# Patient Record
Sex: Male | Born: 1990 | Race: Black or African American | Hispanic: No | Marital: Single
Health system: Midwestern US, Community
[De-identification: ages and names within clinical notes are randomized; demographics above are authoritative.]

## PROBLEM LIST (undated history)

## (undated) DIAGNOSIS — J45909 Unspecified asthma, uncomplicated: Secondary | ICD-10-CM

## (undated) DIAGNOSIS — Z21 Asymptomatic human immunodeficiency virus [HIV] infection status: Secondary | ICD-10-CM

---

## 2011-05-19 NOTE — ED Provider Notes (Signed)
MEDICATION ADMINISTRATION SUMMARY              Drug Name: PredniSONE, Dose Ordered: 60 mg , Route: Oral, Status:         Given, Time: 22:51 05/19/2011, Detailed record available in Medication         Service section.       KNOWN ALLERGIES   NKDA       TRIAGE Wynelle Link May 19, 2011 22:11 MWW1)   PATIENT: NAME: Travis [name], AGE: 21, GENDER: male, DOB: 1990-09-12, TIME OF GREET: Sun May 19, 2011 22:00, LANGUAGE: [place],         [place]: [id number], KG WEIGHT: 104.3, HEIGHT: 180cm, MEDICAL RECORD         NUMBER: [id number], ACCOUNT NUMBER: [id number], PCP: Dr Delford Field,. Wynelle Link May 19, 2011 22:11 MWW1)   ADMISSION: URGENCY: 3, TRANSPORT: Ambulatory, DEPT: Emergency,         BED: WAITING. Wynelle Link May 19, 2011 22:11 MWW1)   VITAL SIGNS: BP 175/112, (Sitting), Pulse 88, Resp 24, Temp 97.8,         (Oral), Pain 5, O2 Sat 100%, on Room air, Time 05/19/2011 22:07.         (22:07 MWW1)   COMPLAINT:  Asthma Attack. Wynelle Link May 19, 2011 22:11 MWW1)   PRESENTING COMPLAINT:  Pt states that when bending down he         started to have pain in chest and wheezing and unable to walk due to         pain, Since Today, TIME Since 22:00. (22:34 EAG1)   PAIN: Patient complains of pain, On a scale 0-10 patient rates         pain as 5, Jumping - R and L chest, Pain is constant, Onset was 40         minutes prior to arrival, No modifying factors. (22:34 EAG1)   TREATMENT PRIOR TO ARRIVAL: None. (22:34 EAG1)   TB SCREENING: TB screen negative for this patient. (22:34         EAG1)   ABUSE SCREENING: Patient denies physical abuse or threats. (22:34         EAG1)   FALL RISK: Patient has a low risk of falling. (22:34 EAG1)   SUICIDAL IDEATION: Suicidal ideation is not present. (22:34         EAG1)   ADVANCE DIRECTIVES: Patient does not have advance directives.         (22:34 EAG1)   PROVIDERS: TRIAGE NURSE: Hoyle Sauer, RN. Wynelle Link May 19, 2011         22:11 MWW1)       PRESENTING PROBLEM Wynelle Link May 19, 2011 22:11 MWW1)       Presenting problems: Dyspnea - Adult.       CURRENT MEDICATIONS (22:12 MWW1)   Albuterol Sulfate:  As needed every four hours.       MEDICATION SERVICE (22:51 MMA)   PredniSONE:  Order: PredniSONE (Prednisone) - Dose: 60         mg : Oral         Ordered by: Phil Dopp, PA-C         Entered by: Phil Dopp, PA-C Sun May 19, 2011 22:42 ,          Acknowledged by: Berton Lan, LPN Sun May 19, 2011 22:44         Documented  as given by: Berton Lan, LPN Sun May 19, 2011 22:51          Patient, Medication, Dose, Route and Time verified prior to         administration.          Amount given: 60mg , Site: Medication administered P.O., Correct         patient, time, route, dose and medication confirmed prior to         administration, Patient advised of actions and side-effects prior to         administration, Allergies confirmed and medications reviewed prior to         administration, Patient in position of comfort, Side rails up, Cart         in lowest position, Family at bedside, Call light in reach.       ORDERS   HAND HELD NEBULIZER:  Ordered for: Arvella Merles, MD, Rolm Gala         Status: Active. (22:41 MMA)   12 LEAD EKG:  Ordered for: Arvella Merles, MD, Rolm Gala         Status: Active. (22:41 MMA)   I-Stat Chem8+ (complete):  Ordered for: Arvella Merles, MD, Rolm Gala         Status: Done by Ulla Gallo, RN, Bary Castilla May 19, 2011 23:20. (23:06         [place])   CHEST 2 VIEWS:  Ordered for: Arvella Merles, MD, Rolm Gala         Status: Active. (23:06 [place])   HAND HELD NEBULIZER:  Ordered for: Arvella Merles, MD, Rolm Gala         Status: Active. (23:31 EHK1)       NURSING ASSESSMENT: RESPIRATORY /CHEST (22:43 WAB1)   CONSTITUTIONAL: History obtained from patient, Patient arrives         ambulatory, Gait steady, Patient appears comfortable, Patient         cooperative, Patient alert, Oriented to person, place and time, Skin         warm, Skin dry, Skin normal in color, Mucous membranes pink, Mucous         membranes moist, Patient is well-groomed.    PAIN: aching pain, to the left chest, to the right chest,         constant, on a scale 0-10 patient rates pain as 5, Pain exacerbated         by nothing, Nothing has been tried to alleviate the pain.   RESPIRATORY/CHEST: Respiratory assessment findings include         respiratory effort easy, Respirations regular, Conversing normally,         no signs of distress, Breath sounds with wheezing, audibly, to the         left upper lobe, to the left lower lobe, Breath sounds otherwise         clear.       NURSING PROCEDURE: DISCHARGE NOTE (23:46 WAB1)   DISCHARGE: Patient discharged to home, ambulating without         assistance, family driving, accompanied by parent, Summary of Care         printed/ provided, Discharge instructions given to patient, Simple or         moderate discharge teaching performed, by w.bennetch RN,         Prescriptions given and instructions on side effects given, Name of         prescription(s) given: proair, prednisone, Above person(s) verbalized         understanding of  discharge instructions and follow-up care, Patient         treated and evaluated by physician.       NURSING PROCEDURE: EKG CHART (22:51 JEC3)   PATIENT IDENTIFIER: Patient's identity verified by patient         stating name, Patient's identity verified by patient stating birth         date, Patient's identity verified by hospital ID bracelet, Patient         actively involved in identification process.   EKG: 12 lead EKG performed on the left chest, done by jec, Notes:         done @ 2246.   FOLLOW-UP: After procedure, EKG for interpretation given to Dr.         Mickie Kay, EKG was given to Dr. at 2249.   NOTES: Patient tolerated procedure well.   SAFETY: Side rails up, Cart/Stretcher in lowest position, Family         at bedside, Call light within reach, Hospital ID band on.       NURSING PROCEDURE: LAB DRAW (23:26 WAB1)   PATIENT IDENTIFIER: Patient's identity verified by patient          stating name, Patient's identity verified by patient stating birth         date, Patient's identity verified by hospital ID bracelet, Patient         actively involved in identification process.   LAB DRAW: Lab draw indicated for inability to obtain labs from IV         site, Initial lab draw performed, by venipuncture, from left         antecubital, in one attempt, Labs were drawn at 2322, Tourniquet         removed from patient after procedure., Notes: [email or web address] completed @         bedside.   FOLLOW-UP: After procedure, dressing applied to site, After         procedure, no swelling at site, After procedure, no active bleeding         from site.       NURSING PROCEDURE: TRANSPORT TO TESTS   PATIENT IDENTIFIER: Patient's identity verified by patient         stating name, Patient's identity verified by patient stating birth         date, Patient's identity verified by hospital ID bracelet. (23:09         GNW)   TRANSPORT TO TESTS: Patient transported to x-ray, ambulatory,         Accompanied by x-ray technician. (23:09 GNW)   FOLLOW-UP: After procedure, patient returned to emergency         department. (23:14 GNW)       DIAGNOSIS (23:39 MMA)   FINAL: PRIMARY: Asthma exacerbation (acute).       DISPOSITION   PATIENT:  Disposition Type: Discharged, Disposition: Discharge,         Condition: Stable. (23:39 MMA)      Patient left the department. (23:47 WAB1)       VITAL SIGNS   VITAL SIGNS: BP: 175/112 (Sitting), Pulse: 88, Resp: 24, Temp:         97.8 (Oral), Pain: 5, O2 sat: 100% on Room air, Time: 05/19/2011         22:07. (22:07 MWW1)     BP: 193/106 (Lying), Pulse: 94, Resp: 20, Time: 05/19/2011 22:35. (22:35         EAG1)  BP: 165/104 (Lying), Pulse: 80, Resp: 16, Pain: 0, Time: 05/19/2011 23:34.         (23:34 EAG1)       INSTRUCTION (23:41 MMA)   DISCHARGE:  ASTHMA (RAD, WHEEZING, REVERSIBLE AIRWAY DISEASE),         ELEVATED BLOOD PRESSURE (HTN, HYPERTENSION, HIGH BP, ELEVATED BP).    SPECIAL:  Follow up with primary care physician for recheck of         your blood pressure.         Take prescribed medication(s) as directed. Use inhaler 2 puffs every         4 hours.          Return to the ER if condition worsens or new symptoms develop.       PRESCRIPTION   ProAir HFA:  Aerosol With Adapter : Cfc Free 90 Mcg/Inh :         Inhalation : Quantity: *** 2 *** Unit: puff(s) Route: Inhalation         Schedule: As needed every 4 to 6 hours Dispense: *** 1 ***. (23:24         MMA)   NOTES:  No refills          May use generic. (23:24 MMA)   PredniSONE:  Tablet : 20 Mg : Oral : Quantity: *** 3 *** Unit:         tab(s) Route: Oral Schedule: once a day Dispense: *** 9 ***. (23:39         MMA)   NOTES:  take 3 tabs once a day for three days          No refills          May use generic. (23:39 MMA)       ADMIN (23:47 WAB1)   DIGITAL SIGNATURE:  Bennetch, RN, Whitney.   Key:     EAG1=Gibson, LPN, Alene Mires, MD, Rolm Gala  GNW=Weston, RAD [facility],     Gina     JEC3=[place], ACT III, Junie  MMA=Adams, PA-C, [employer]  MWW1=Watson,     RN, [employer], RN, [employer]

## 2011-05-19 NOTE — Procedures (Signed)
Test Reason : Chest pain   Blood Pressure : ***/*** mmHG   Vent. Rate : 086 BPM     Atrial Rate : 086 BPM      P-R Int : 128 ms          QRS Dur : 084 ms       QT Int : 340 ms       P-R-T Axes : 049 008 039 degrees      QTc Int : 406 ms   *** Poor data quality, interpretation may be adversely affected - overlapping    QRS aVR and aVL   Normal sinus rhythm with sinus arrhythmia   Moderate voltage criteria for LVH, may be normal variant   Borderline ECG   No previous ECGs available   Confirmed by Ashby, M.D., Charles Jr. (30) on 05/20/2011 8:40:10 AM   Referred By:             Overread By: Charles J    Ashby, M.D.

## 2011-05-19 NOTE — Procedures (Signed)
Test Reason : Chest pain   Blood Pressure : ***/*** mmHG   Vent. Rate : 086 BPM     Atrial Rate : 086 BPM      P-R Int : 128 ms          QRS Dur : 084 ms       QT Int : 340 ms       P-R-T Axes : 049 008 039 degrees      QTc Int : 406 ms   *** Poor data quality, interpretation may be adversely affected - overlapping    QRS aVR and aVL   Normal sinus rhythm with sinus arrhythmia   Moderate voltage criteria for LVH, may be normal variant   Borderline ECG   No previous ECGs available   Confirmed by Cleon Gustin, M.D., Madelynn Done. (30) on 05/20/2011 8:40:10 AM   Referred By:             Overread By: Merlene Pulling, M.D.

## 2011-07-23 NOTE — ED Provider Notes (Signed)
KNOWN ALLERGIES   NKDA       TRIAGE (Tue Jul 23, 2011 21:33 LDS0)   PATIENT: NAME: Travis [name], AGE: 21, GENDER: male, DOB: Jul 01, 1990, TIME OF GREET: Tue Jul 23, 2011 21:22, LANGUAGE: [place],         [place]: [id number], KG WEIGHT: 104.3, MEDICAL RECORD NUMBER: [id number],         ACCOUNT NUMBER: [id number], PCP: Pt Denies,. (Tue Jul 23, 2011 21:33         LDS0)   ADMISSION: URGENCY: 4, TRANSPORT: Ambulatory, DEPT: Emergency,         BED: WAITING. (Tue Jul 23, 2011 21:33 LDS0)   VITAL SIGNS: BP 173/98, Pulse 92, Resp 16, Temp 99.3, Pain 10, O2         Sat 97, on Room air, Time 07/23/2011 21:31. (21:31 LDS0)   COMPLAINT:  Cyst On Tail Bone. (21:59 KNR3)   PRESENTING COMPLAINT:  CYST ON TAILBONE; AREA BECOMPING LARGER         AND COMPLAINS INCREASED PAIN OVER LAST 3 DAYS. (21:35 LDS0)   PROVIDERS: TRIAGE NURSE: Bynum Bellows, RN, CEN. (Tue Jul 23, 2011 21:33 LDS0)   PREVIOUS VISIT ALLERGIES: Nkda. (Tue Jul 23, 2011 21:33         LDS0)       PRESENTING PROBLEM (21:33 LDS0)      Presenting problems: Cyst.       CURRENT MEDICATIONS (21:33 LDS0)   Albuterol Sulfate:  As needed every four hours.       NURSING ASSESSMENT: SKIN (23:13 RLS1)   CONSTITUTIONAL: History obtained from patient, Patient arrives         ambulatory, Gait steady, Patient appears comfortable, Patient         cooperative, Patient alert, Oriented to person, place and time, Skin         warm, Skin dry, Skin normal in color, Mucous membranes pink, Mucous         membranes moist, Patient is well-groomed.   PAIN: aching pain, constant, on a scale 0-10 patient rates pain         as 4.   SKIN: Skin assessment findings include skin warm, Skin dry, Skin         normal in color, Inspection findings include signs of infection, to         TAILBONE ABCESS.   NOTES: Emotional support needed and given, Patient tolerated         procedure well.   SAFETY: Side rails up, Cart/Stretcher in lowest position, Family          at bedside, Call light within reach, Hospital ID band on.       DIAGNOSIS (23:06 NJB)   FINAL: PRIMARY: Acute pilonidal abscess I&amp;d.       DISPOSITION   PATIENT:  Disposition Type: Discharged, Disposition: Discharge,         Condition: Stable. (23:06 NJB)      Patient left the department. (23:14 RLS1)       VITAL SIGNS (21:31 LDS0)   VITAL SIGNS: BP: 173/98, Pulse: 92, Resp: 16, Temp: 99.3, Pain:         10, O2 sat: 97 on Room air, Time: 07/23/2011 21:31.       INSTRUCTION (23:05 NJB)   DISCHARGE:  PILONIDAL ABSCESS (PILONIDAL CYST, SKIN INFECTION,         ABSCESS).   FOLLOWUP:  Margarita Grizzle, SURGERY, [address], [place]         [address], [phone].   SPECIAL:  Return to ER in 2-3 days for packing removal.         Follow up with surgeon for definitive treatment.         Vicodin as needed for relief of pain - do not: drive, drink alcohol,         operate machinery, take Tylenol while on this medication.         Read and understand discharge instructions prior to leaving ER.         Follow these in regards to care and return for those reasons as         detailed.         Return to the ER if condition worsens or new symptoms develop.       PRESCRIPTION (23:03 NJB)   Vicodin:  Tablet : 500 mg-5 mg : Oral : Quantity: *** 1-2 ***         Unit: tab Route: Oral Schedule: As needed every 6 hours Dispense: ***         12 ***.   NOTES:  No refills          May use generic.   Key:     KNR3=Lewis, Clydie Braun  LDS0=Shoemaker, RN, CEN, Misty Stanley  NJB=Brosky, PA-C, [employer]ass     RLS1=Senn, RN, [employer]

## 2011-07-25 NOTE — ED Provider Notes (Signed)
KNOWN ALLERGIES   NKDA       TRIAGE (Thu Jul 25, 2011 17:47 BMS1)   PATIENT: NAME: Travis [name], AGE: 21, GENDER: male, DOB: 1990/04/14, TIME OF GREET: Thu Jul 25, 2011 17:42, LANGUAGE: [place],         [place]: [phone], KG WEIGHT: 104.3 (est.), HEIGHT: 180cm, MEDICAL         RECORD NUMBER: [id number], ACCOUNT NUMBER: [id number], PCP: Pt Denies,.         (Thu Jul 25, 2011 17:47 BMS1)   ADMISSION: URGENCY: 4, TRANSPORT: Ambulatory, DEPT: Emergency,         BED: WAITING. (Thu Jul 25, 2011 17:47 BMS1)   VITAL SIGNS: BP 184/102, (Sitting), Pulse 95, Resp 16, Temp 99.0,         (Oral), Pain 7, O2 Sat 97, on Room air, Time 07/25/2011 17:45. (17:45         BMS1)   COMPLAINT:  Packing Removal. (Thu Jul 25, 2011 17:47 BMS1)   PRESENTING COMPLAINT:  for packing removal right buttocks. (19:41         CL8)   PAIN: No complaint of pain. (19:41 CL8)   TB SCREENING: TB screen not applicable for this patient. (19:41         CL8)   ABUSE SCREENING: Not Applicable. (19:41 CL8)   FALL RISK: Patient has a low risk of falling. (19:41 CL8)   SUICIDAL IDEATION: Not Applicable. (19:41 CL8)   ADVANCE DIRECTIVES: Unknown if patient has advance directives.         (19:41 CL8)   PROVIDERS: TRIAGE NURSE: Gypsy Lore, RN, MSN. (Thu Jul 25, 2011 17:47 BMS1)   PREVIOUS VISIT ALLERGIES: Nkda. (Thu Jul 25, 2011 17:47         BMS1)       PRESENTING PROBLEM (17:47 BMS1)      Presenting problems: Wound Re-evaluation with or without Suture Removal.       CURRENT MEDICATIONS (17:47 BMS1)   Albuterol Sulfate:  As needed every four hours.   Vicodin:  1-2 tab Oral As needed every 6 hours. x 500 mg-5 mg -         Oral - Q6PRN.       NURSING PROCEDURE: DISCHARGE NOTE (20:58 DEF1)   DISCHARGE: Patient discharged to home, ambulating without         assistance, family driving, accompanied by other family member,         Discharge instructions given to patient, Prescriptions given and          instructions on side effects given, Name of prescription(s) given:         vicodin, bactrim.   SAFETY: Side rails up, Cart/Stretcher in lowest position, Family         at bedside, Call light within reach, Hospital ID band on.       NURSING PROCEDURE: NURSE NOTES (19:54 [facility])   NURSES NOTES: Patient in no apparent distress.       DIAGNOSIS (20:07 JOHO)   FINAL: PRIMARY: wound recheck; packing removal (nc), ADDITIONAL:         elevated blood pressure.       DISPOSITION   PATIENT:  Disposition Type: Discharged, Disposition: Discharge,         Condition: Stable. (20:07 [facility])      Patient left the department. (20:59 DEF1)       VITAL  SIGNS (17:45 BMS1)   VITAL SIGNS: BP: 184/102 (Sitting), Pulse: 95, Resp: 16, Temp:         99.0 (Oral), Pain: 7, O2 sat: 97 on Room air, Time: 07/25/2011 17:45.       INSTRUCTION (20:09 JOHO)   DISCHARGE:  ELEVATED BLOOD PRESSURE (HTN, HYPERTENSION, HIGH BP,         ELEVATED BP).   FOLLOWUPMargarita Rana, SURGERY, [id number],         [place] [address], [phone], Rayetta Humphrey, MEDICINE,         [address] [place] [address], [phone].   SPECIAL:  -warm compresses to area several times daily; let warm         shower water massage area         -clean area w/ soap/water         -have your blood pressure rechecked when feeling better         -please return for recheck if not continuing to improve next 3-4         days.       PRESCRIPTION (20:07 JOHO)   Bactrim DS:  Tablet : 800 mg-160 mg : Oral : Quantity: *** 1 ***         Unit: tab Route: Oral Schedule: 2 times a day Dispense: *** 14 ***.   NOTES:  No refills          May use generic.   Vicodin:  Tablet : 500 mg-5 mg : Oral : Quantity: *** 1 *** Unit:         tab Route: Oral Schedule: Q6HPRN Dispense: *** 12 ***.   NOTES   Key:     BMS1=Shortt, RN, MSN, Britta Mccreedy  CL8=[place], RN, BSN, [employer]      DEF1=Flowers, LPN, [employer]     JOHO=Hubbard, PA-C, Peyton Najjar, ACT III, Selena Batten

## 2011-11-06 MED ADMIN — lidocaine (XYLOCAINE) 20 mg/mL (2 %) injection 200 mg: INTRADERMAL | @ 08:00:00 | NDC 63323048627

## 2011-11-06 MED FILL — XYLOCAINE 20 MG/ML (2 %) INJECTION SOLUTION: 20 mg/mL (2 %) | INTRAMUSCULAR | Qty: 20

## 2011-11-06 NOTE — ED Notes (Signed)
Patient armband removed and shredded  I have reviewed discharge instructions with the patient.  The patient verbalized understanding.

## 2011-11-06 NOTE — ED Notes (Signed)
Abscess abscess tailbone started about 3 days ago

## 2011-11-06 NOTE — ED Provider Notes (Addendum)
HPI Comments: 21 yo M with hx of asthma and abscesses presents to the ED c/o pilonidal abscess x 3 days.  Pt states that the abscess is painful and that he has not had any drainage.  PT states that he has pain with BM.  Pt states that he has had these in the past without needing surgery.  Pt denies smoking.  Pt denies fever, chills, N/V/D, CP, SOB, drainage, and any other sx or complaints.           Past Medical History   Diagnosis Date   ??? Asthma         History reviewed. No pertinent past surgical history.      History reviewed. No pertinent family history.     History     Social History   ??? Marital Status: SINGLE     Spouse Name: N/A     Number of Children: N/A   ??? Years of Education: N/A     Occupational History   ??? Not on file.     Social History Main Topics   ??? Smoking status: Never Smoker    ??? Smokeless tobacco: Not on file   ??? Alcohol Use: No   ??? Drug Use: No   ??? Sexually Active:      Other Topics Concern   ??? Not on file     Social History Narrative   ??? No narrative on file                  ALLERGIES: Review of patient's allergies indicates no known allergies.      Review of Systems   Constitutional: Negative for fever, chills, diaphoresis and unexpected weight change.   HENT: Negative for ear pain, congestion, sore throat, rhinorrhea, drooling, trouble swallowing, neck pain and tinnitus.    Eyes: Negative for photophobia, pain, redness and visual disturbance.   Respiratory: Negative for cough, choking, chest tightness, shortness of breath, wheezing and stridor.    Cardiovascular: Negative for chest pain, palpitations and leg swelling.   Gastrointestinal: Negative for nausea, vomiting, abdominal pain, diarrhea, constipation, blood in stool, abdominal distention and anal bleeding.   Genitourinary: Negative for dysuria, urgency, frequency, hematuria, flank pain and difficulty urinating.   Musculoskeletal: Negative for back pain and arthralgias.   Skin: Negative for color change, rash and wound.        (+)  pilonidal abscess.   Neurological: Negative for dizziness, seizures, syncope, speech difficulty, light-headedness and headaches.   Psychiatric/Behavioral: Negative for suicidal ideas, hallucinations, behavioral problems, self-injury and agitation. The patient is not hyperactive.    All other systems reviewed and are negative.        Filed Vitals:    11/06/11 0330 11/06/11 0331   BP: 153/99    Pulse: 91    Temp: 98.1 ??F (36.7 ??C)    Resp: 18    Height:  5\' 11"  (1.803 m)   Weight:  102.513 kg (226 lb)   SpO2: 100%             Physical Exam   Nursing note and vitals reviewed.  Constitutional: He is oriented to person, place, and time. He appears well-developed and well-nourished. No distress.   HENT:   Head: Normocephalic and atraumatic.   Mouth/Throat: Oropharynx is clear and moist.   Eyes: Conjunctivae and EOM are normal. Pupils are equal, round, and reactive to light. No scleral icterus.   Neck: Normal range of motion. Neck supple.   Cardiovascular: Normal rate,  regular rhythm and normal heart sounds.    No murmur heard.  Pulmonary/Chest: Effort normal and breath sounds normal. No respiratory distress.   Abdominal: Soft. Bowel sounds are normal. He exhibits no distension. There is no tenderness.   Musculoskeletal: He exhibits no edema.   Lymphadenopathy:     He has no cervical adenopathy.   Neurological: He is alert and oriented to person, place, and time. Coordination normal.   Skin: Skin is warm and dry. No rash noted.        4 x 2 cm fluctuant pilonidal abscess.   Psychiatric: He has a normal mood and affect. His behavior is normal.        MDM     Differential Diagnosis; Clinical Impression; Plan:     Acute pilonidal abscess no evidence of systemic infection      I&D ABCESS SIMP  Consent: Verbal consent obtained.  Consent given by: patient  Date/Time: 11/06/2011 4:21 AM  Performed by: attendingPreparation: skin prepped with Betadine and sterile field established  Pre-procedure re-eval: Immediately prior to the  procedure, the patient was reevaluated and found suitable for the planned procedure and any planned medications.  Time out: Immediately prior to the procedure a time out was called to verify the correct patient, procedure, equipment, staff and marking as appropriate..  Location details: left buttocks  Anesthesia: local infiltration  Local anesthetic: lidocaine 2% without epinephrine  Anesthetic total: 8 ml  Scalpel size: 11  Incision type: single straight  Complexity: simple  Drainage: purulent and bloody  Drainage amount: copious  Wound treatment: expression of material and wound left open  Packing material: 1/2 in iodoform gauze  Post-procedure: dressing applied  Patient tolerance: Patient tolerated the procedure well with no immediate complications.  My total time at bedside, performing this procedure was 1-15 minutes.        -------------------------------------------------------------------------------------------------------------------     EKG INTERPRETATIONS:      RESULTS:       CONSULTATIONS:      PROGRESS NOTES:      SCRIBE ATTESTATION STATEMENT:  3:55 AM  Provider documentation written by: Avelina Laine  Acting as a scribe for Dr. Burtis Junes, MD ED Provider     I have reviewed the information recorded by the scribe and agree with its contents.    -------------------------------------------------------------------------------------------------------------------    Diagnosis:   1. Pilonidal abscess          Disposition: home    Follow-up Information     Follow up With Details Comments Contact Info    [facility] EMERGENCY DEPT  As needed if symptoms worsen [phone]    [facility] EMERGENCY DEPT  3 days for wound check [phone]          Patient's Medications   Start Taking    CEPHALEXIN (KEFLEX) 500 MG CAPSULE    Take 1 Cap by mouth four (4) times daily for 10 days.    OXYCODONE-ACETAMINOPHEN (PERCOCET) 5-325 MG PER TABLET    Take 1 Tab by mouth every four (4) hours as needed for Pain.     TRIMETHOPRIM-SULFAMETHOXAZOLE (BACTRIM DS) 160-800 MG PER TABLET    Take 1 Tab by mouth two (2) times a day for 7 days.   Continue Taking    ALBUTEROL (PROVENTIL VENTOLIN) 2.5 MG /3 ML (0.083 %) NEBULIZER SOLUTION    by Nebulization route once.    IPRATROPIUM-ALBUTEROL (COMBIVENT) 18-103 MCG/ACTUATION INHALER    Take  by inhalation every six (6) hours as needed.   These  Medications have changed    No medications on file   Stop Taking    No medications on file

## 2011-11-10 NOTE — ED Provider Notes (Signed)
I was personally available for consultation in the emergency department. I have reviewed the chart prior to the patient's discharge and agree with the documentation recorded by the MLP, including the assessment, treatment plan, and disposition.

## 2011-11-10 NOTE — ED Notes (Signed)
With packing in his mid back abscess that was lanced on 11/06/2011. Here for wound recheck.

## 2011-11-10 NOTE — ED Notes (Signed)
Patient armband removed and shredded. I have reviewed discharge instructions with the patient.  The patient verbalized understanding. Signature pad not working after several attempts, patient signed paperwork to be scanned into  Chart.

## 2011-11-10 NOTE — ED Provider Notes (Signed)
HPI Comments: 21 yo male presents for wound check of pilonidal cyst abscess. Feels much better. Packing fell out on its own.     PMH asthma     Patient is a 21 y.o. male presenting with wound check.   Wound Check          Past Medical History   Diagnosis Date   ??? Asthma         History reviewed. No pertinent past surgical history.      History reviewed. No pertinent family history.     History     Social History   ??? Marital Status: SINGLE     Spouse Name: N/A     Number of Children: N/A   ??? Years of Education: N/A     Occupational History   ??? Not on file.     Social History Main Topics   ??? Smoking status: Never Smoker    ??? Smokeless tobacco: Not on file   ??? Alcohol Use: No   ??? Drug Use: No   ??? Sexually Active: Yes -- Male partner(s)     Birth Control/ Protection: Condom     Other Topics Concern   ??? Not on file     Social History Narrative   ??? No narrative on file                  ALLERGIES: Review of patient's allergies indicates no known allergies.      Review of Systems   Constitutional: Negative.  Negative for fever and chills.   Skin: Positive for wound.       Filed Vitals:    11/10/11 0334 11/10/11 0339   BP: 256/117 168/124   Pulse: 69    Temp: 98.5 ??F (36.9 ??C)    Resp: 15    Height: 5\' 11"  (1.803 m)    Weight: 102.513 kg (226 lb)             Physical Exam   Skin:        Well healing pilonidal cyst abscess, minimal drainage. Packing had fallen out   No cellulitis or inflammation         MDM     Differential Diagnosis; Clinical Impression; Plan:     Well healing pilonidal cyst abscess, minimal drainage. Packing had fallen out     improved    Amount and/or Complexity of Data Reviewed:   Clinical lab tests:  Ordered and reviewed      Procedures    Diagnosis:   1. Wound check, abscess          Disposition: discharge home     Follow-up Information     Follow up With Details Comments Contact Info    [facility] EMERGENCY DEPT  As needed [phone]          Patient's Medications   Start Taking    No medications on file    Continue Taking    ALBUTEROL (PROVENTIL VENTOLIN) 2.5 MG /3 ML (0.083 %) NEBULIZER SOLUTION    by Nebulization route once.    CEPHALEXIN (KEFLEX) 500 MG CAPSULE    Take 1 Cap by mouth four (4) times daily for 10 days.    IPRATROPIUM-ALBUTEROL (COMBIVENT) 18-103 MCG/ACTUATION INHALER    Take  by inhalation every six (6) hours as needed.    OXYCODONE-ACETAMINOPHEN (PERCOCET) 5-325 MG PER TABLET    Take 1 Tab by mouth every four (4) hours as needed for Pain.    TRIMETHOPRIM-SULFAMETHOXAZOLE (BACTRIM  DS) 160-800 MG PER TABLET    Take 1 Tab by mouth two (2) times a day for 7 days.   These Medications have changed    No medications on file   Stop Taking    No medications on file

## 2012-04-11 NOTE — ED Provider Notes (Signed)
[facility]  EMERGENCY DEPARTMENT TREATMENT REPORT  NAME:  Travis [name]  SEX:   M  ADMIT: 04/11/2012  DOB:   [birth year]  MR#    [id number]  ROOM:    TIME SEEN: 06 57 AM  ACCT#  [id number]        TIME OF EVALUATION:   6:24    CHIEF COMPLAINT:  Abscess.    HISTORY OF PRESENT ILLNESS:  This is a 22 year old male with history of pilonidal abscess presents with a   flare of now for about 5 days.  He has had this before.  He has had to have it   drained before.  He has had no fever, no chills.  There has been no injury or   trauma.  Currently rates his pain on 7 on a 0 to 10  scale.    REVIEW OF SYSTEMS:  CONSTITUTIONAL:  No fever, chills, or weight loss.  RESPIRATORY:  No cough, shortness of breath, or wheezing.   CARDIOVASCULAR:  No chest pain, chest pressure, or palpitations.   GASTROINTESTINAL:  No vomiting, diarrhea, or abdominal pain. He denies any    difficulty moving his bowels.   INTEGUMENTARY: Prior history of a pilonidal abscess which he has had before.  NEUROLOGICAL:  No headaches, sensory or motor symptoms.    Denies complaints in all other systems.     PAST MEDICAL HISTORY:  History of asthma, history of having pilonidal cysts before requiring   drainage.    MEDICATIONS:  Were reviewed.    ALLERGIES:  NONE KNOWN.    SOCIAL HISTORY:  He does not smoke.    PHYSICAL EXAMINATION:  GENERAL:  He is a well-developed male, nontoxic.  VITAL SIGNS:  Blood pressure is 169/101, pulse 93, respirations 16,   temperature 98.2, O2 saturation 100% on room air.  HEENT:  Mucous membranes pink.  NECK:  Supple, nontender, symmetrical, no masses or JVD, trachea midline,   thyroid not enlarged, nodular or tender.   LUNGS:  Clear to auscultation.  HEART:  Regular rate and rhythm.  ABDOMEN:  Soft and nontender.  INTEGUMENTARY: Buttock shows an area of redness and swelling just to the right   of the gluteal crease.  It is pointing.  There is some surrounding redness    and induration does not cross the midline. It does not extend down toward  the   anus.  There is no streaking.  No crepitus surrounding this area.     NEUROLOGICAL:  He is awake, alert and oriented.    INITIAL ASSESSMENT AND MANAGEMENT PLAN:  This is a 22 year old male with a pilonidal abscess that will require   drainage.  Discussed with patient, he is agreeable.  He did drive himself   here.     PROCEDURE NOTE:  The area was cleaned and prepped in the usual sterile fashion, anesthetized   with 2% lidocaine, following which an #11 blade was used to make an incision   with return of moderate amount of purulent drainage.  Loculations were broken.    The cavity was irrigated and packed with 1/4-inch iodoform gauze followed by   sterile gauze dressing.  Bleeding was minimal, controlled.  The patient   tolerated well.    COURSE:  Findings were discussed.  He has an appointment on Monday to follow up. We   will  have him keep that and have the packing removed at that visit or return   here for the packing removal, if it  has not fallen out by then. Discussed warm   compresses to the area.  Discussed using ibuprofen . I have written for Norco   and Bactrim.  He understands to certainly seek medical attention worsening or   new concerns. I have also written him a note for work.    FINAL DIAGNOSIS:  Acute pilonidal abscess, status post incision and drainage with packing   placed.    DISPOSITION AND PLAN:  The patient was discharged home in stable condition to follow up as above.    The patient was examined and evaluated by myself and Dr. Haze Justin who   agrees with the above assessment and plan.      ___________________  Konrad Felix MD  Dictated By: Maurice Small. Williams Che, [place]    My signature above authenticates this document and my orders, the final   diagnosis (es), discharge prescription (s), and instructions in the [facility]   Pulsecheck record.  JW  D:04/11/2012  T: 04/11/2012 22:57:48  [id number]   Authenticated by Janett Billow. Henrene Hawking, M.D. On 04/15/2012 02:47:58 AM

## 2012-10-14 NOTE — ED Provider Notes (Signed)
[facility]  EMERGENCY DEPARTMENT TREATMENT REPORT  NAME:  Travis [name]  SEX:   M  ADMIT: 10/14/2012  DOB:   March 09, 1990  MR#    [id number]  ROOM:    TIME SEEN: 04 36 AM  ACCT#  [id number]        PRIMARY CARE PHYSICIAN:  None.    TIME EVALUATION:  0319.    CHIEF COMPLAINT:  Rash.    HISTORY OF PRESENT ILLNESS:  The patient is a 22 year old male who states he began having itching and rash   on his torso and proximal extremities after the carpet in his apartment got   replaced and he was having to sleep on the floor with just a sheet between him   and the carpet.  He has been itchy.  He has no other complaints.    REVIEW OF SYSTEMS:  CONSTITUTIONAL:  No fever, chills, or weight loss.  ENT:  No sore throat, runny nose, or other URI symptoms.  ALLERGIC/IMMUNOLOGIC:  He has urticaria times almost a week.    RESPIRATORY:  No cough, shortness of breath, or wheezing.  GENITOURINARY:  No dysuria, frequency, or urgency.  INTEGUMENTARY:  He has urticaria.    PAST MEDICAL HISTORY:  Significant for asthma, pilonidal cyst.      PAST SURGICAL HISTORY:  No surgical history.    SOCIAL HISTORY:  Consumes alcohol socially.  Denies tobacco and drug use.    ALLERGIES AND CURRENT MEDICATIONS:  Reviewed in [employer].    PHYSICAL EXAMINATION:  VITAL SIGNS:  Blood pressure 153/98, pulse 90, respirations 18, temperature   99.2, pain 0, O2 sats 98% on room air.  GENERAL APPEARANCE:  Patient appears well developed and well nourished.    Appearance and behavior are age and situation appropriate.  RESPIRATORY:  Clear and equal breath sounds.  No respiratory distress,   tachypnea, or accessory muscle use.  CARDIOVASCULAR:  Heart is regular, no murmurs or rubs.  INTEGUMENTARY:  The patient has urticaria on his trunk and proximal   extremities.    INITIAL ASSESSMENT AND MANAGEMENT PLAN:  The patient is a 22 year old male who seems to be having an allergic reaction   to new carpet, but not any difficulty with respiratory symptoms. I am  going to   treat him with 60 mg prednisone here and send him home with a prescription   for the same.    COURSE IN THE EMERGENCY ROOM:  The patient remained stable, he was given 60 mg prednisone p.o.    DIAGNOSIS:  Hives.     DISPOSITION:  He was discharged home with a prescription for prednisone.  Instructed to   return for new or worsening symptoms or any concerns.  The patient is   discharged with verbal and written instructions and a referral for ongoing   care.  The patient is aware that they may return at any time for new or   worsening symptoms.      The patient was personally evaluated by myself and Dr. Mickie Kay, who agrees   with the above assessment and plan.      ___________________  Candace Cruise MD  Dictated By: Wynelle Laton. Towns, PA-C    My signature above authenticates this document and my orders, the final   diagnosis (es), discharge prescription (s), and instructions in the [facility]   Pulsecheck record.    If you have any questions please contact [phone].    DS  D:10/14/2012 69:62:95  T: 10/14/2012  04:55:08  [id number]  Authenticated by Lyman Speller Luciano Cutter, M.D. On 10/14/2012 04:32:13 PM

## 2012-12-09 NOTE — ED Provider Notes (Signed)
[facility]  EMERGENCY DEPARTMENT TREATMENT REPORT  NAME:  Travis [name]  SEX:   M  ADMIT: 12/09/2012  DOB:   [birth year]  MR#    [id number]  ROOM:    TIME DICTATED: 08 05 PM  ACCT#  [id number]    cc: Felipa Evener MD    TIME OF EVALUATION:    1930.       CHIEF COMPLAINT:  Chest pop while lifting.    HISTORY OF PRESENT ILLNESS:  This is a 22 year old male who was at work this evening and about an hour and   a half prior to evaluation he was helping unload a truck.  There was a box   that needed to be lifted up.  A coworker was helping him lift it up but   apparently got distracted and let go of his end of the box, such that he had   hold the weight of the box.  He heard a pop.  He is not sure exactly where it   was.  A couple of minutes later he noticed pain in the right side of his   chest.  It hurts if he tries to move his right arm.  He denies any pleuritic   pain, no pain radiating to his back.  He has had no palpitations, no abdominal   pain.  Currently rates his discomfort an 8 on a 0 to 10 scale.    REVIEW OF SYSTEMS:     CONSTITUTIONAL:  No fever, chills, or weight loss.  EYES:  No visual symptoms.  ENT:  No sore throat, runny nose, or other URI symptoms.  RESPIRATORY:  No cough, shortness of breath, or wheezing.  CARDIOVASCULAR:  No chest pain, chest pressure, or palpitations.  GASTROINTESTINAL:  No vomiting, diarrhea, or abdominal pain.  MUSCULOSKELETAL:  Positive for right-sided chest discomfort.  A pop that   occurred when he was lifting and a coworker let go of their end of the box.    He denies any calf pain or swelling.  INTEGUMENTARY:  No rash.  NEUROLOGICAL:  No headaches, sensory or motor symptoms.  Denies complaints in all other systems.    PAST MEDICAL HISTORY:  He has a history of asthma.    MEDICATIONS:  He has an inhaler that he uses.    ALLERGIES:  NONE KNOWN.    SOCIAL HISTORY:  Negative for tobacco or alcohol.  No history of travel.  He was at work when   this occurred.     FAMILY HISTORY:  No history of premature coronary disease.    PHYSICAL EXAMINATION:  GENERAL:  A well-developed male, very pleasant.  VITAL SIGNS:  Blood pressure 146/80, pulse 60, respirations 18, temperature   98.5, O2 sats 100% on room air.  HEENT:  Conjunctivae are clear.  Mouth:  Mucous membranes pink, throat is   clear.  NECK:  Supple, nontender, symmetrical, no masses or JVD, trachea midline,   thyroid not enlarged, nodular, or tender.     LUNGS:  Clear to auscultation with symmetrical expansion.  He is not   tachypneic or dyspneic with conversation.     HEART:  Has a regular rate and rhythm.  CHEST:  Reproducible tenderness over the right anterior chest wall.  I cannot   appreciate any ecchymosis, no soft tissue swelling.  There is no sternal   tenderness.  No sternoclavicular tenderness.  ABDOMEN:  Completely soft and nontender.  BACK:  There is no midline tenderness to palpation  to the thoracolumbar or   lumbosacral spine.  There is no paraspinal tenderness.  EXTREMITIES:  Warm and dry.  Calves soft and nontender.  EXTREMITIES:  Warm and well perfused times 4.  NEUROLOGIC:  DTRs intact.  Strength test and sensation intact.  He is awake,   alert and oriented.        CONTINUATION BY JULIA HUBBARD, PA     INITIAL ASSESSMENT AND MANAGEMENT PLAN:  A 22 year old male with right-sided chest pain and a pop after doing heavy   lifting.  I suspect this is musculoskeletal.  At this time an EKG and chest   x-ray will be obtained.  We will proceed from there.    DIAGNOSTIC INTERPRETATIONS:  His EKG shows sinus rhythm with a ventricular of 71.  No acute changes seen.    Chest x-ray was negative read by radiology.    COURSE IN EMERGENCY DEPARTMENT:  Findings were discussed.  Will write for a few Norco.  Given a dose of   ibuprofen here.  Will give a prescription for ibuprofen to continue.  Have him   followup with his primary doctor if not improving over the next 3 to 4 days.     Note for work was given as well. Certainly seek medical attention any time   worsening or new concerns.    FINAL DIAGNOSIS:  Acute right chest wall strain.    DISPOSITION AND PLAN:  The patient was discharged home in stable condition to follow up as above.    The patient was examined and evaluated by myself and Dr. Hervey Ard who agrees   with the above assessment and plan.      ___________________  Tana Conch MD  Dictated By: Maurice Small. Williams Che, [place]    My signature above authenticates this document and my orders, the final  diagnosis (es), discharge prescription (s), and instructions in the [facility]   Pulsecheck record.  Nursing notes have been reviewed by the physician/mid-level provider.    If you have any questions please contact [phone].    [facility]  D:12/09/2012 20:05:59  T: 12/09/2012 21:02:10  [id number]  Authenticated by Tana Conch, MD On 12/11/2012 05:48:14 PM

## 2014-05-03 ENCOUNTER — Inpatient Hospital Stay
Admit: 2014-05-03 | Discharge: 2014-05-04 | Disposition: A | Discharge status: Home / Self Care | Payer: PRIVATE HEALTH INSURANCE | Attending: Emergency Medicine

## 2014-05-03 DIAGNOSIS — G510 Bell's palsy: Secondary | ICD-10-CM

## 2014-05-03 NOTE — ED Notes (Signed)
Pt states that he has been unable to move the right side of his face since 11am today. Was seen at urgent care and sent for possible bells palsy. Dr. Hervey Ardsuchitani at bedside

## 2014-05-03 NOTE — ED Provider Notes (Signed)
[facility]  EMERGENCY DEPARTMENT TREATMENT REPORT  NAME:  Travis [name]  SEX:   M  ADMIT: 05/03/2014  DOB:   January 23, 1991  MR#    [id number]  ROOM:  ZH08  TIME DICTATED: 07 37 PM  ACCT#  [id number]    cc: Oneita Jolly MD    TIME OF SERVICE:  6:44 p.m.    PRIMARY CARE PHYSICIAN:  Dr. Marciano Sequin.    CHIEF COMPLAINT:   Right side facial paralysis.    HISTORY OF PRESENT ILLNESS:  The patient is a 24 year old African American male, comes in to the ER today   complaining of right facial paralysis.  The patient states that he noticed   when he was brushing his teeth today, that he started to drool out of the   right side of his mouth.  He looked up in the mirror and he was only able to   smile with only half of his mouth.  He was unable to fully close his right eye   and his entire right side of the face just kind of feels numb.  He has not   been able to move anything on the right face without extreme concentration.    No bilateral arm or leg weakness.  No headache.  No fever.  No chills.  He is   getting over a viral upper respiratory infection that lasted about the last   week or two.  He has been completely over it now for the last couple of days.    No history of multiple sclerosis. No history of any neurologic symptoms.  No   history of herpes.  No history of any other major medical problems other than   asthma.      REVIEW OF SYSTEMS:  CONSTITUTIONAL:  No fever, chills, or weight loss.  EYES:  Positive right eye weakness.  Otherwise no other visual symptoms.    ENT:  No sore throat, runny nose, or other URI symptoms.  Right mouth   weakness.  ENDOCRINE:  No diabetic symptoms.   RESPIRATORY:  No cough, shortness of breath or wheezing.  CARDIOVASCULAR:  No chest pain, chest pressure, or palpitations.   GASTROINTESTINAL:  No vomiting, diarrhea, or abdominal pain.   MUSCULOSKELETAL:  No joint pain or swelling.   INTEGUMENTARY:  No rashes.  PSYCHIATRIC:  No suicidal or homicidal ideation.   Denies complaints in all other symptoms.   NEUROLOGIC:  Negative headaches.  Positive sensory and motor decrease on the   right side of the face.      PAST MEDICAL HISTORY:  Asthma.    PAST SURGICAL HISTORY:  None.    FAMILY HISTORY:  Maternal grandparents both have hypertension and cardiac problems.  Mother is   healthy.  Father is healthy.  No other family history.    SOCIAL HISTORY:  The patient still lives with mother.  He is not a smoker.  Does not drink   alcohol and does not use any drugs.  No allergies to any medications.    CURRENT MEDICATIONS:  Albuterol and Combivent.      PHYSICAL EXAMINATION:  VITAL SIGNS:  Temperature 98.3 degrees Fahrenheit with a pulse of 97,   respirations 16, blood pressure  155/100, but admits to being irritated with   staff prior to getting blood pressure  checked with a MAP of 118.  SPO2 was   97% on room air.  The patient has 0 pain.  Height is 5 feet 9 inches.  Weight   is 250 pounds.  GENERAL APPEARANCE:  Patient appears well developed and well nourished.    Appearance and behavior are age and situation appropriate.  EYES:  Conjunctiva are clear bilaterally. Sclera is white and normal   bilaterally.  Bilateral lids are normal with inability to completely close the   right eye.  Patient cannot keep the eye closed upon resistance.  Cannot fully   open right eye.  Pupils are equal, round and reactive to light and   accommodation.  Ears/Nose:  Hearing is grossly intact to voice.  Internal and   external examinations of the ears and nose are unremarkable.  Mouth/Throat:    Surfaces of the pharynx, palate, and tongue are pink, moist, and without   lesions.  Nasal mucosa, septum, and turbinates unremarkable.   Teeth and gums   unremarkable.  Right mouth weakness.  The patient cannot smile.  Also, right   facial nerve paralysis completely.  Cannot wrinkle right forehead.  Cannot   fully move right eyebrow when moving face.  Decreased sensation to the right   face.   NECK:  Supple, nontender, symmetrical, no masses or JVD, trachea midline,   thyroid not enlarged, nodular, or tender.  No cervical or submandibular   lymphadenopathy palpated.  RESPIRATORY:  Clear and equal breath sounds.  No respiratory distress,   tachypnea, or accessory muscle use.    CARDIOVASCULAR:  Heart regular, without murmurs, gallops, rubs or thrills.  DP   pulses 2+ and equal bilaterally.  GI:  Abdomen soft, nontender, without complaint of pain to palpation.  No   hepatomegaly or splenomegaly.    MUSCULOSKELETAL:   Stance and gait appear normal.  SKIN:  Warm and dry without rashes.  PSYCHIATRIC:  Judgment appears appropriate.   NEUROLOGIC:  The patient is alert and oriented times 3. Decreased strength to the right upper and lower face only.  Distal pulses,   motor and sensation are normal throughout all 4 extremities.  Cranial nerves   II-XII are intact otherwise than as mentioned above.  No other neurological deficits other than the   right face.  Normal strength and sensation in 4 extremities.    INITIAL IMPRESSION:  Possible Bell's palsy.  Will discuss with Dr. Huntley Dec Tsuchitani. Low suspicion for stroke or intracranial process.    DIFFERENTIAL DIAGNOSIS:  Bell's palsy; low suspicion for cerebrovascular accident, brain bleed.      No lab tests were done here.  No medications were given here.  No diagnostic   studies were done here.      EMERGENCY DEPARTMENT MEDICAL DECISION-MAKING:   Dr. Huntley Dec Tsuchitani agrees that it is a Bell's palsy.  We will put the patient   on acyclovir, prednisone and have him do Natural Tears and tape his eye   closed at right until the symptoms resolve.  The patient has an appointment   already to speak with his primary care physician Dr. Marciano Sequin.  Advised to follow   up next week and to return with any further symptoms develop such as   Headache or any other weakness or numbness.    DIAGNOSIS: BELL'S PALSY.    ___________________  Judeen Hammans Tsuchitani MD   Dictated By: Leamon Arnt, PA    My signature above authenticates this document and my orders, the final  diagnosis (es), discharge prescription (s), and instructions in the [facility]   Pulsecheck record.  Nursing notes have been reviewed by the physician/mid-level provider.    If you  have any questions please contact [phone].    BB  D:05/03/2014 19:37:23  T: 05/03/2014 20:42:40  [id number]

## 2014-05-04 MED ORDER — ACYCLOVIR 800 MG TAB
800 mg | ORAL_TABLET | Freq: Every day | ORAL | Status: AC
Start: 2014-05-04 — End: 2014-05-10

## 2014-05-04 MED ORDER — PREDNISONE 10 MG TABLETS IN A DOSE PACK
10 mg | ORAL_TABLET | ORAL | Status: DC
Start: 2014-05-04 — End: 2014-05-09

## 2014-05-09 DIAGNOSIS — G510 Bell's palsy: Secondary | ICD-10-CM

## 2014-05-09 NOTE — ED Notes (Signed)
Patient comes in complaining of right facial pain. Patient states that he has bells palsy and was told to come back if it did not get better

## 2014-05-09 NOTE — ED Notes (Signed)
Pt states he does not want the breathing treatment, and he just wants to go home.  PA made aware

## 2014-05-10 ENCOUNTER — Inpatient Hospital Stay
Admit: 2014-05-10 | Discharge: 2014-05-10 | Disposition: A | Discharge status: Home / Self Care | Payer: PRIVATE HEALTH INSURANCE | Attending: Emergency Medicine

## 2014-05-10 MED ORDER — IPRATROPIUM-ALBUTEROL 2.5 MG-0.5 MG/3 ML NEB SOLUTION
2.5 mg-0.5 mg/3 ml | RESPIRATORY_TRACT | Status: DC
Start: 2014-05-10 — End: 2014-05-09

## 2014-05-10 MED ORDER — FLUTICASONE 50 MCG/ACTUATION NASAL SPRAY, SUSP
50 mcg/actuation | Freq: Every day | NASAL | Status: DC
Start: 2014-05-10 — End: 2018-07-21

## 2014-05-10 NOTE — ED Provider Notes (Signed)
[facility]ER[facility] TREATMENT REPORT  NAME:  Travis [name], Travis [name]  SEX:   M  ADMIT: 05/09/2014  DOB:   [birth year]  MR#    [id number]  ROOM:  H02  TIME DICTATED: 12 31 AM  ACCT#  [id number]        TIME EVALUATION:  2215    PRIMARY CARE PHYSICIAN:  Dr. Marciano SequinFoote    CHIEF COMPLAINT:  Right sided facial paralysis and ear pain.    HISTORY OF PRESENT ILLNESS:  This is a 24 year old male, was just evaluated in emergency department 03/08,   diagnosed with Bell's Palsy, was placed on a course of prednisone for taper   and acyclovir.  He has finished acyclovir.  Is going to be finishing up the   prednisone taper tomorrow.  He says that he began experiencing ringing in the   right ear and some pain in the right ear and facial paralysis has not gotten   any better, so he came back because he said that they \\"told me to come back   if my symptoms worsened.\\"  The patient has not yet followed up with his   primary care physician, but he is supposed to see them next week.  He denies   any fever, chills, cough, chest pain, shortness of breath, abdominal pain,   nausea, vomiting, diarrhea.  Minimal pain in the right ear, 3 out of 10.  No   other alleviating or exacerbating factors.  Denies any trauma.    REVIEW OF SYSTEMS:  CONSTITUTIONAL:  No fever, chills, or weight loss.  ENT:  Positive for otalgia, right.      PHYSICAL EXAMINATION:  ENT:  The patient has a normal TM with just a little bit of clear fluid behind   the TM.  There is no erythema or any exudate or any sign of an infection.    Oropharynx:  No erythema, no edema, no exudate.  RESPIRATORY:  Clear and equal breath sounds.  No respiratory distress,   tachypnea, or accessory muscle use.    CARDIOVASCULAR:   Heart regular, without murmurs, gallops, rubs, or thrills.    GI:  Abdomen soft, nontender, without complaint of pain to palpation.  No   hepatomegaly or splenomegaly.  MUSCULOSKELETAL:  Stance and gait appear normal.   NEUROLOGIC:  The patient is alert and oriented times 3.  He has obvious   weakness on the right side of his face due to the Bell's Palsy which has not   resolved completely.  A little bit of facial drooping in corner of the right   eye.    CONTINUATION BY:   JEFFREY PADGETT, PA-C    IMPRESSON AND PLAN:     This is a 24 year old male with history of facial Bells palsy, seen her on   05/03/2014 for evaluation of ringing in his right ear.  He has completed his   course of medications with the exception of 1 more dose of prednisone.  His   clinical evaluation shows there is still a little bit of clear fluid on the   right ear.  His facial palsy continues.    I discussed that with the patient   and I actually had the PA who saw him yesterday, look at him and said he   looked about the same.   He was having a little bit of wheezing and my plan   was to give him a breathing treatment here, however, the patient refused.  He  just wanted to go home after we told him that there was no additional   treatment that would be necessary.    DIAGNOSES:  1.  Bells palsy.  2.  Otalgia, right ear.    DISPOSTION:    Patient remained completely stable during his course in the Emergency   Department and was discharged home in stable condition.  We told him to   continue the prednisone, wrote him for Flonase, and advised him to followup   with his regular care doctor as planned.   Return to the Emergency Department   if his condition worsens or any new symptoms develop.  The patient was   agreeable with the plan.  The patient was seen and evaluated by myself and Dr.   Delton See, who agrees with the above assessment and plan.     ADDENDUM BY JEFF PADGETT, PA-C:    The patient refuses the breathing treatment and wanted to leave the   department.  He, in fact, did so with discharge instructions; however, Dr.   Delton See was not able to visit the patient prior to his leaving the Emergency   Department.        ___________________   Gwenyth Allegra MD  Dictated By: Fransisca Kaufmann, PA-C    My signature above authenticates this document and my orders, the final  diagnosis (es), discharge prescription (s), and instructions in the [facility]   Pul[facility]heck record.  Nursing notes have been reviewed by the physician/mid-level provider.    If you have any questions please contact [phone].    RKV  D:05/10/2014 00:31:55  T: 05/10/2014 08:22:43  [id number]

## 2015-06-05 DIAGNOSIS — G510 Bell's palsy: Secondary | ICD-10-CM

## 2015-06-05 NOTE — ED Notes (Signed)
I have reviewed discharge instructions with the patient.  The patient verbalized understanding.

## 2015-06-05 NOTE — ED Provider Notes (Signed)
[facility]  Emergency Department Treatment Report    Patient: Travis [name] Age: 25 y.o. Sex: male    Date of Birth: 1990/11/16 Admit Date: 06/05/2015 PCP: Not On File [facility]   MRN: [id number]  CSN: [id number]     Room: ER14/ER14 Time Dictated: 8:46 PM      Chief Complaint   Chief Complaint   Patient presents with   ??? Facial Droop         History of Present Illness   25 y.o. male presents to the emergency department with left sided facial droop that has been present since yesterday morning. He remarks that he has had similar symptoms on the right side of his face occurring March 2016, with a dose of oral meds given in the emergency department without complete resolve of his symptoms. Patient denies trouble with word finding, confusion, difficulty with gait, weakness in upper or lower extremities. Patient denies fevers, recent cough, nausea, vomiting, diarrhea or recent illness. He has had seasonal allergies.  Review of Systems   Constitutional: No fever, chills. No weakness.   Eyes: No blurred vision, double vision, or loss of vision.  ENT: +congestion and sneezing, thought to be due to seasonal allergies. No sore throat or ear pain.  Respiratory: No cough, shortness of breath or wheezing.  Cardiovascular: No chest pain, palpitations.   Gastrointestinal: No vomiting, diarrhea or abdominal pain.  Genitourinary: No painful urination, frequency, or urgency.  Musculoskeletal: No joint pain or lower extremity swelling.  Integumentary: No rashes.  Neurological: No headache or dizziness. No extremity weakness or paresthesias. No trouble with word finding, gait, confusion.   Past Medical/Surgical History     Past Medical History:   Diagnosis Date   ??? Asthma    ??? Neurological disorder      No past surgical history on file.    Social History     Social History     Social History   ??? Marital status: SINGLE     Spouse name: N/A   ??? Number of children: N/A   ??? Years of education: N/A      Social History Main Topics   ??? Smoking status: Never Smoker   ??? Smokeless tobacco: Not on file   ??? Alcohol use No   ??? Drug use: No   ??? Sexual activity: Yes     Partners: Female     Birth control/ protection: Condom     Other Topics Concern   ??? Not on file     Social History Narrative   denies smoking, alcohol or drug use    Family History   No family history on file.    Home Medications     Prior to Admission Medications   Prescriptions Last Dose Informant Patient Reported? Taking?   albuterol (PROVENTIL VENTOLIN) 2.5 mg /3 mL (0.083 %) nebulizer solution   Yes No   Sig: by Nebulization route once.   fluticasone (FLONASE) 50 mcg/actuation nasal spray   No No   Sig: 2 Sprays by Both Nostrils route daily.   ipratropium-albuterol (COMBIVENT) 18-103 mcg/actuation inhaler   Yes No   Sig: Take  by inhalation every six (6) hours as needed.      Facility-Administered Medications: None     Allergies   No Known Allergies    Physical Exam   ED Triage Vitals   Enc Vitals Group      BP 06/05/15 2026 161/109      Pulse (Heart Rate) 06/05/15  2026 91      Resp Rate 06/05/15 2026 18      Temp 06/05/15 2026 98.2 ??F (36.8 ??C)      Temp src --       O2 Sat (%) 06/05/15 2026 99 %      Weight 06/05/15 2015 231 lb      Height 06/05/15 2015 5\' 9"       Head Cir --       Peak Flow --       Pain Score --       Pain Loc --       Pain Edu? --       Excl. in GC? --      General: Patient appears well developed and well nourished.   HEENT: Conjunctiva clear. PERRL. EOMs intact bilat.TMs clear without injection. Left facial droop with asymmetry of lips and cheek in comparison to right at rest. Not able to raise eyebrows or close eyes tightly.  Mucous membranes moist, non-erythematous.   Neck: Supple, symmetrical. No masses noted.   Respiratory: Lungs clear to auscultation, nonlabored respirations. No wheezes, rhonchi, or rales.  Cardiovascular: Regular rate and rhythm without murmurs rubs or gallops.   Gastrointestinal: Abdomen soft, nondistended. No abdominal tenderness to palpation.  Musculoskeletal: No peripheral edema.  Integumentary: Warm and dry without rashes or lesions.  Neurologic: Alert and oriented, moving all extremities. Asymmetric facial pattern. Finger to nose and heel to shin coordination intact bilat. RAM hand flip intact bilat. UE /LE strength and sensation intact bilat.   Impression and Management Plan   Patient has evidence of asymmetry on facial exam without other neurological deficits. He has evidence of facial nerve palsy due to involvement of the forehead, as he would have no forehead involvement with acute CVA. Patient denies recent viral illness or sickness, yet admits to seasonal allergies with congestion. Patient is feeling well otherwise. He complains of no symptoms to evaluate for bacterial etiology of infection.    Diagnostic Studies   Lab:   No results found for this or any previous visit (from the past 12 hour(s)).    Imaging:    No results found.  ED Course/ Medical Decision Making   Patient was seen by ER attending, who agrees with dispo. We will give patient first dose of Acyclovir and Prednisone here in the ED, and discharge to home with ten days of both. Patient will be given life coach consult for assistance in followup due to no insurance.   Final Diagnosis       ICD-10-CM ICD-9-CM   1. Bell's palsy G51.0 351.0       Disposition   Discharge to home.   Patient was advised to return to the Emergency Department if new or worsening symptoms.      The patient was personally evaluated by myself and Dr. Jama FlavorsManolio who agrees with the above assessment and plan    Hilma FavorsBrittany J Ivy Meriwether, PA  June 05, 2015    My signature above authenticates this document and my orders, the final ??  diagnosis (es), discharge prescription (s), and instructions in the Epic ??  record.  If you have any questions please contact [phone].  ??   Nursing notes have been reviewed by the physician/ advanced practice ??  Clinician.

## 2015-06-05 NOTE — ED Triage Notes (Signed)
Pt complains of left facial droop and intermittent left eyelid,upper lip and chin tingling that started yesterday morning.   Pt denies any speech problems, numbness, headache or difficulty with gait.  Pt states he was diagnosed with right facial bells palsy this time last year.  Pt is unable to raise his left eyebrow and unable to smile

## 2015-06-05 NOTE — Progress Notes (Signed)
Pt was in the ED after LC hours.  No insurance/no pcp.  LC called pt na lm on vm asking pt to return to call.  LC would like to connect pt to TCC due to his asthma.  LC sent to pt in the mail: a letter, script savings card, financial asst appt & cceva info.  Included [facility] information and the [facility] April calendar.  Lc looks forward to working with this pt.

## 2015-06-06 ENCOUNTER — Inpatient Hospital Stay
Admit: 2015-06-06 | Discharge: 2015-06-06 | Disposition: A | Discharge status: Home / Self Care | Payer: Self-pay | Attending: Emergency Medicine

## 2015-06-06 MED ORDER — ACYCLOVIR 800 MG TAB
800 mg | Freq: Once | ORAL | Status: AC
Start: 2015-06-06 — End: 2015-06-05
  Administered 2015-06-06: 03:00:00 via ORAL

## 2015-06-06 MED ORDER — ACYCLOVIR 800 MG TAB
800 mg | ORAL_TABLET | Freq: Every day | ORAL | 0 refills | Status: AC
Start: 2015-06-06 — End: 2015-06-15

## 2015-06-06 MED ORDER — PREDNISONE 20 MG TAB
20 mg | ORAL | Status: AC
Start: 2015-06-06 — End: 2015-06-05
  Administered 2015-06-06: 02:00:00 via ORAL

## 2015-06-06 MED ORDER — PREDNISONE 10 MG TAB
10 mg | ORAL_TABLET | ORAL | 0 refills | Status: DC
Start: 2015-06-06 — End: 2015-09-18

## 2015-06-06 MED FILL — PREDNISONE 20 MG TAB: 20 mg | ORAL | Qty: 3

## 2015-06-06 MED FILL — ACYCLOVIR 800 MG TAB: 800 mg | ORAL | Qty: 1

## 2015-09-18 ENCOUNTER — Inpatient Hospital Stay
Admit: 2015-09-18 | Discharge: 2015-09-18 | Disposition: A | Discharge status: Home / Self Care | Payer: Self-pay | Attending: Emergency Medicine

## 2015-09-18 ENCOUNTER — Emergency Department: Admit: 2015-09-18 | Payer: Self-pay | Primary: Family Medicine

## 2015-09-18 DIAGNOSIS — J45909 Unspecified asthma, uncomplicated: Secondary | ICD-10-CM

## 2015-09-18 LAB — METABOLIC PANEL, BASIC
BUN: 14 mg/dl (ref 7–25)
CO2: 24 mEq/L (ref 21–32)
Calcium: 9.2 mg/dl (ref 8.5–10.1)
Chloride: 105 mEq/L (ref 98–107)
Creatinine: 0.9 mg/dl (ref 0.6–1.3)
GFR est AA: >60
GFR est non-AA: >60
Glucose: 108 mg/dl — ABNORMAL HIGH (ref 74–106)
Potassium: 3.9 mEq/L (ref 3.5–5.1)
Sodium: 138 mEq/L (ref 136–145)

## 2015-09-18 LAB — CBC WITH AUTOMATED DIFF
BASOPHILS: 0.5 % (ref 0–3)
EOSINOPHILS: 1 % (ref 0–5)
HCT: 45.6 % (ref 37.0–50.0)
HGB: 16 gm/dl (ref 12.4–17.2)
IMMATURE GRANULOCYTES: 0.4 % (ref 0.0–3.0)
LYMPHOCYTES: 21 % — ABNORMAL LOW (ref 28–48)
MCH: 29.3 pg (ref 23.0–34.6)
MCHC: 35.1 gm/dl (ref 30.0–36.0)
MCV: 83.4 fL (ref 80.0–98.0)
MONOCYTES: 7 % (ref 1–13)
MPV: 13 fL — ABNORMAL HIGH (ref 6.0–10.0)
NEUTROPHILS: 70.1 % — ABNORMAL HIGH (ref 34–64)
NRBC: 0 (ref 0–0)
PLATELET: 155 10*3/uL (ref 140–450)
RBC: 5.47 M/uL (ref 3.80–5.70)
RDW-SD: 36.8 (ref 35.1–43.9)
WBC: 10.7 10*3/uL (ref 4.0–11.0)

## 2015-09-18 LAB — POC CHEM8
CALCIUM,IONIZED: 4.7 mg/dL (ref 4.40–5.40)
CO2, TOTAL: 24 mmol/L (ref 21–32)
Creatinine: 0.9 mg/dl (ref 0.6–1.3)
Glucose: 111 mg/dL — ABNORMAL HIGH (ref 74–106)

## 2015-09-18 LAB — POC TROPONIN: Troponin-I: 0 ng/ml (ref 0.00–0.07)

## 2015-09-18 MED ORDER — SODIUM CHLORIDE 0.9 % IJ SYRG
Freq: Once | INTRAMUSCULAR | Status: AC
Start: 2015-09-18 — End: 2015-09-18
  Administered 2015-09-18: 17:00:00 via INTRAVENOUS

## 2015-09-18 MED ORDER — PREDNISONE 20 MG TAB
20 mg | ORAL | Status: AC
Start: 2015-09-18 — End: 2015-09-18
  Administered 2015-09-18: 17:00:00 via ORAL

## 2015-09-18 MED ORDER — IPRATROPIUM-ALBUTEROL 2.5 MG-0.5 MG/3 ML NEB SOLUTION
2.5 mg-0.5 mg/3 ml | RESPIRATORY_TRACT | Status: AC
Start: 2015-09-18 — End: 2015-09-18
  Administered 2015-09-18: 16:00:00 via RESPIRATORY_TRACT

## 2015-09-18 MED ORDER — PREDNISONE 20 MG TAB
20 mg | ORAL_TABLET | Freq: Every day | ORAL | 0 refills | Status: AC
Start: 2015-09-18 — End: 2015-09-21

## 2015-09-18 MED ORDER — ALBUTEROL SULFATE HFA 90 MCG/ACTUATION AEROSOL INHALER
90 mcg/actuation | RESPIRATORY_TRACT | 0 refills | Status: DC | PRN
Start: 2015-09-18 — End: 2016-01-27

## 2015-09-18 MED FILL — PREDNISONE 20 MG TAB: 20 mg | ORAL | Qty: 3

## 2015-09-18 MED FILL — IPRATROPIUM-ALBUTEROL 2.5 MG-0.5 MG/3 ML NEB SOLUTION: 2.5 mg-0.5 mg/3 ml | RESPIRATORY_TRACT | Qty: 3

## 2015-09-18 NOTE — ED Provider Notes (Signed)
[facility]  Emergency Department Treatment Report    Patient: Travis [name] Age: 25 y.o. Sex: male    Date of Birth: 1991/02/01 Admit Date: 09/18/2015 PCP: Not On File Bshsi   MRN: [id number]  CSN: [id number]     Room: ER28/ER28 Time Dictated: 3:56 PM      Chief Complaint   Chief Complaint   Patient presents with   ??? Wheezing   ??? Cough       History of Present Illness   25 y.o. male presents to the ER today stating that he has developed wheezing that started last night and has continued to the day. Patient reports that he tried using his inhaler but this did not help. Patient states the wheezing became much worse when he received a phone call that a family member had passed away. He states that when he heard the news he had an anxiety attack where he developed shortness of breath, chest pain, and pain down in his arms. Patient states that the symptoms have resolved except for the wheezing. Patient reports that he has had a productive cough but denies any fevers or URI symptoms.    Review of Systems   Constitutional:  No fever or chills  Eyes: No visual symptoms.  ENT: No sore throat, runny nose or ear pain.  Respiratory: Cough and wheezing  Cardiovascular:  chest pain, now resolved  Gastrointestinal: No vomiting, diarrhea or abdominal pain  Genitourinary: No dysuria, frequency, or urgency.  Musculoskeletal: No joint pain   Integumentary: No rashes  Neurological: No headaches, sensory or motor symptoms.  Denies complaints in all other systems  Past Medical/Surgical History     Past Medical History:   Diagnosis Date   ??? Asthma    ??? Neurological disorder     Bell's palsy     History reviewed. No pertinent surgical history.    Social History     Social History     Social History   ??? Marital status: SINGLE     Spouse name: N/A   ??? Number of children: N/A   ??? Years of education: N/A     Social History Main Topics   ??? Smoking status: Never Smoker   ??? Smokeless tobacco: None   ??? Alcohol use No    ??? Drug use: No   ??? Sexual activity: Yes     Partners: Female     Birth control/ protection: Condom     Other Topics Concern   ??? None     Social History Narrative       Family History   History reviewed. No pertinent family history.    Home Medications     Prior to Admission Medications   Prescriptions Last Dose Informant Patient Reported? Taking?   fluticasone (FLONASE) 50 mcg/actuation nasal spray   No No   Sig: 2 Sprays by Both Nostrils route daily.   ipratropium-albuterol (COMBIVENT) 18-103 mcg/actuation inhaler   Yes No   Sig: Take  by inhalation every six (6) hours as needed.      Facility-Administered Medications: None       Allergies   No Known Allergies    Physical Exam   ED Triage Vitals   Enc Vitals Group      BP 09/18/15 1124 154/104      Pulse (Heart Rate) 09/18/15 1124 104      Resp Rate 09/18/15 1124 18      Temp 09/18/15 1124 98.6 ??F (37 ??C)  Temp src --       O2 Sat (%) 09/18/15 1124 98 %      Weight 09/18/15 1114 196 lb      Height 09/18/15 1114       Head Cir --       Peak Flow --       Pain Score --       Pain Loc --       Pain Edu? --       Excl. in GC? --      Constitutional: Patient appears well developed and well nourished.  Appearance and behavior are age and situation appropriate.  HEENT: Conjunctiva clear.  PERRLA. Mucous membranes moist, non-erythematous. Surface of the pharynx, palate, and tongue are pink, moist and without lesions.  Neck: supple, non tender, symmetrical, no masses or JVD.   Respiratory: lungs have expiratory wheezing noted, nonlabored respirations. No tachypnea or accessory muscle use.  Cardiovascular: heart regular rate and rhythm without murmur rubs or gallops.   Calves soft and non-tender. Distal pulses 2+ and equal bilaterally.  No peripheral edema or significant variscosities.    Gastrointestinal:  Abdomen soft, nontender without complaint of pain to palpation  Musculoskeletal: Nail beds pink with prompt capillary refill   Integumentary: warm and dry without rashes or lesions  Neurologic: alert and oriented, Sensation intact, motor strength equal and symmetric.  No facial asymmetry or dysarthria.        Impression and Management Plan   Patient's being evaluated today for his wheezing along with this episode chest pain he experienced. This was likely an anxiety attack but I will obtain a screening EKG along with laboratory studies and a chest x-ray. He will be given a DuoNeb and steroids and then reevaluated.  Diagnostic Studies   Lab:   Labs Reviewed   CBC WITH AUTOMATED DIFF - Abnormal; Notable for the following:        Result Value    MPV 13.0 (*)     NEUTROPHILS 70.1 (*)     LYMPHOCYTES 21.0 (*)     All other components within normal limits   METABOLIC PANEL, BASIC - Abnormal; Notable for the following:     Glucose 108 (*)     All other components within normal limits   POC CHEM8 - Abnormal; Notable for the following:     Glucose 111 (*)     All other components within normal limits   POC TROPONIN-I   POC TROPONIN-I   POC CHEM8       Imaging:    Xr Chest Pa Lat    Result Date: 09/18/2015  INDICATION: Wheezing/Chest Pain   EXAMINATION: XR CHEST PA LAT COMPARISON: 12/09/2012 FINDINGS: The study shows a normal sized heart.. The lungs are clear and well expanded.       IMPRESSION: Normal chest       EKG: Dr. Arvella Merles  did not see any acute S-T segment or T-wave abnormalities that are consistent with acute ischemia or infarction.    ED Course/Medical Decision Making   Patient received 1 DuoNeb while he was in the ER and received 60 mg of prednisone. Patient was reevaluated and no further wheezing was noted, lungs were clear to auscultation. Patient was feeling much better. Patient's risk factors were reviewed and he has no family history of heart disease. He  believes that this chest pain he experienced was related to anxiety from the death of his family member.  At this time patient can  be discharged home without any further  intervention. He states that he is almost out of his inhaler so I will fill this prescription and he will receive a first dose of prednisone.  Final Diagnosis       ICD-10-CM ICD-9-CM   1. Wheezing R06.2 786.07   2. Anxiety state F41.1 300.00       Disposition   Home  Discussed patient, findings, and treatments with Dr. Arvella Merles  prior to discharge and they are in agreement with treatment and discharge plan.    Caleen Jobs Cana Mignano, NP  September 18, 2015    My signature above authenticates this document and my orders, the final ??  diagnosis (es), discharge prescription (s), and instructions in the Epic ??  record.  If you have any questions please contact [phone].  ??  Nursing notes have been reviewed by the physician/ advanced practice ??  Clinician.    Dragon medical dictation software was used for portions of this report.  Unintended transcription errors may occur.

## 2015-09-18 NOTE — ED Triage Notes (Signed)
Complains of shortness of breath, hx of asthma.  He panicked he said when his inhaler was not working.  Hx of hypertension, not on medication

## 2015-09-18 NOTE — ED Notes (Signed)
I have reviewed discharge instructions with the patient.  The patient verbalized understanding.

## 2015-09-19 LAB — EKG 12-LEAD
Atrial Rate: 91 {beats}/min
Diagnosis: NORMAL
P Axis: 57 degrees
P-R Interval: 124 ms
Q-T Interval: 338 ms
QRS Duration: 76 ms
QTc Calculation (Bazett): 415 ms
R Axis: 3 degrees
T Axis: 35 degrees
Ventricular Rate: 91 {beats}/min

## 2015-09-19 LAB — EKG, 12 LEAD, INITIAL
Atrial Rate: 91 {beats}/min
Calculated P Axis: 57 degrees
Calculated R Axis: 3 degrees
Calculated T Axis: 35 degrees
Diagnosis: NORMAL
P-R Interval: 124 ms
Q-T Interval: 338 ms
QRS Duration: 76 ms
QTC Calculation (Bezet): 415 ms
Ventricular Rate: 91 {beats}/min

## 2016-01-27 ENCOUNTER — Emergency Department: Admit: 2016-01-27 | Payer: Self-pay | Primary: Family Medicine

## 2016-01-27 ENCOUNTER — Inpatient Hospital Stay
Admit: 2016-01-27 | Discharge: 2016-01-27 | Disposition: A | Discharge status: Home / Self Care | Payer: Self-pay | Attending: Emergency Medicine

## 2016-01-27 DIAGNOSIS — J45901 Unspecified asthma with (acute) exacerbation: Secondary | ICD-10-CM

## 2016-01-27 MED ORDER — ALBUTEROL SULFATE HFA 90 MCG/ACTUATION AEROSOL INHALER
90 mcg/actuation | RESPIRATORY_TRACT | 0 refills | Status: AC | PRN
Start: 2016-01-27 — End: ?

## 2016-01-27 MED ORDER — ALBUTEROL SULFATE HFA 90 MCG/ACTUATION AEROSOL INHALER
90 mcg/actuation | RESPIRATORY_TRACT | 0 refills | Status: DC | PRN
Start: 2016-01-27 — End: 2016-01-27

## 2016-01-27 MED ORDER — PREDNISONE 20 MG TAB
20 mg | ORAL | Status: AC
Start: 2016-01-27 — End: 2016-01-27
  Administered 2016-01-27: 16:00:00 via ORAL

## 2016-01-27 MED ORDER — IPRATROPIUM-ALBUTEROL 2.5 MG-0.5 MG/3 ML NEB SOLUTION
2.5 mg-0.5 mg/3 ml | RESPIRATORY_TRACT | Status: AC
Start: 2016-01-27 — End: 2016-01-27
  Administered 2016-01-27: 16:00:00 via RESPIRATORY_TRACT

## 2016-01-27 MED ORDER — PREDNISONE 20 MG TAB
20 mg | ORAL_TABLET | Freq: Every day | ORAL | 0 refills | Status: AC
Start: 2016-01-27 — End: 2016-01-31

## 2016-01-27 MED ORDER — IPRATROPIUM-ALBUTEROL 2.5 MG-0.5 MG/3 ML NEB SOLUTION
2.5 mg-0.5 mg/3 ml | RESPIRATORY_TRACT | Status: AC
Start: 2016-01-27 — End: 2016-01-27
  Administered 2016-01-27: 15:00:00 via RESPIRATORY_TRACT

## 2016-01-27 MED FILL — PREDNISONE 20 MG TAB: 20 mg | ORAL | Qty: 3

## 2016-01-27 MED FILL — IPRATROPIUM-ALBUTEROL 2.5 MG-0.5 MG/3 ML NEB SOLUTION: 2.5 mg-0.5 mg/3 ml | RESPIRATORY_TRACT | Qty: 3

## 2016-01-27 NOTE — ED Notes (Addendum)
Pt ambulatory to RM 12; steady gait without difficulty.  Speaks full sentences without difficulty.  C/O cough with wheezing & shortness of breath onset this am; last used inhaler 3 days ago.  Denies CP, fever/chills, abd pain, back pain, N/V/D, or other complaints.  RT at bedside to start neb treatment.

## 2016-01-27 NOTE — ED Triage Notes (Signed)
C/o shortness of breath, cough and wheezing that started this am. HX of asthma   Reports lost his inhaler.

## 2016-01-27 NOTE — ED Notes (Signed)
Pt medicated per MAR.  Pt's ID & allergies verified with pt & armband prior to administration.  Pt verbalized understanding of medication given.  Pt denied further needs at this time.  SR up x 1, bed down/locked. Call light in pt's lap.  Will continue to monitor.  RT notified by Zella Ballobin, RN regarding repeat neb treatment.

## 2016-01-27 NOTE — ED Notes (Signed)
11:45 AM  01/27/16     Discharge instructions given to patient (name) with verbalization of understanding. Patient accompanied by alone.  Patient discharged with the following prescriptions prednisone albuterol. Patient discharged to home (destination).      Barnetta Chapelobin N Damweber, RN

## 2016-01-27 NOTE — ED Provider Notes (Signed)
[facility]  Emergency Department Treatment Report    Patient: Travis [name] Age: 25 y.o. Sex: male    Date of Birth: 05/08/1990 Admit Date: 01/27/2016 PCP: Sol BlazingPhys Other, MD   MRN: [id number]  CSN: [id number]  Attending:  Smitty CordsERIK H KISA, MD   Room: ER12/ER12 Time Dictated: 11:16 AM APP:  Eliot Fordhristine Zuriel Roskos      Chief Complaint   Chief Complaint   Patient presents with   ??? Cough   ??? Wheezing   ??? Shortness of Breath       History of Present Illness   25 y.o. male presents to the ER today stating that he has a history of asthma.  Patient reports he's had some intermittent wheezing and he's used his inhaler.  He was unable to find his inhaler this morning and his wheezing has gotten worse.  He reports he has associated shortness of breath with the wheezing and a nonproductive cough that just started today.  Patient denies any fevers or other URI symptoms.    Review of Systems   Constitutional:  No fever, chills, or weight loss  Eyes: No visual symptoms.  ENT: No sore throat, runny nose or ear pain.  Respiratory: Positive for cough wheezing and shortness of breath  Cardiovascular: No chest pain, pressure, palpitations, tightness or heaviness.  Gastrointestinal: No vomiting, diarrhea or abdominal pain  Musculoskeletal: No joint pain   Integumentary: No rashes  Neurological: No headaches, sensory or motor symptoms.  Denies complaints in all other systems  Past Medical/Surgical History     Past Medical History:   Diagnosis Date   ??? Asthma    ??? Hypertension    ??? Neurological disorder     Bell's palsy     History reviewed. No pertinent surgical history.    Social History     Social History     Social History   ??? Marital status: SINGLE     Spouse name: N/A   ??? Number of children: N/A   ??? Years of education: N/A     Social History Main Topics   ??? Smoking status: Never Smoker   ??? Smokeless tobacco: Never Used   ??? Alcohol use No   ??? Drug use: No   ??? Sexual activity: Yes     Partners: Female      Birth control/ protection: Condom     Other Topics Concern   ??? None     Social History Narrative       Family History   History reviewed. No pertinent family history.    Home Medications     Prior to Admission Medications   Prescriptions Last Dose Informant Patient Reported? Taking?   fluticasone (FLONASE) 50 mcg/actuation nasal spray Not Taking at Unknown time  No No   Sig: 2 Sprays by Both Nostrils route daily.   ipratropium-albuterol (COMBIVENT) 18-103 mcg/actuation inhaler Not Taking at Unknown time  Yes No   Sig: Take  by inhalation every six (6) hours as needed.      Facility-Administered Medications: None       Allergies   No Known Allergies    Physical Exam   ED Triage Vitals   Enc Vitals Group      BP 01/27/16 0947 163/109      Pulse (Heart Rate) 01/27/16 0945 84      Resp Rate 01/27/16 0947 22      Temp 01/27/16 0947 98.3 ??F (36.8 ??C)      Temp src --  O2 Sat (%) 01/27/16 0945 98 %      Weight 01/27/16 0940 194 lb      Height 01/27/16 0940 5\' 9"       Head Cir --       Peak Flow --       Pain Score --       Pain Loc --       Pain Edu? --       Excl. in GC? --      Constitutional: Patient appears well developed and well nourished.  Appearance and behavior are age and situation appropriate.  HEENT: Conjunctiva clear.  PERRLA. Mucous membranes moist, non-erythematous. Surface of the pharynx, palate, and tongue are pink, moist and without lesions.  Neck: supple, non tender, symmetrical, no masses or JVD.   Respiratory: Scattered diffuse wheezing is noted.  Asian is able to talk in full sentences without any dyspnea.  No tachypnea or accessory muscle use.  Cardiovascular: heart regular rate and rhythm without murmur rubs or gallops.   Gastrointestinal:  Abdomen soft, nontender without complaint of pain to palpation  Musculoskeletal: Nail beds pink with prompt capillary refill  Integumentary: warm and dry without rashes or lesions  Neurologic: alert and oriented, Sensation intact, motor strength equal and  symmetric.        Impression and Management Plan   Patient's being evaluated today for an exacerbation of his asthma symptoms after he ran out of his inhaler.  I will obtain a chest x-ray to rule out any infectious process.  Diagnostic Studies   Lab:   Labs Reviewed - No data to display    Imaging:    Xr Chest Pa Lat    Result Date: 01/27/2016  Clinical history: Shortness of breath EXAMINATION: PA and lateral views of the chest 01/27/2016 Correlation: 09/18/2015 FINDINGS: Trachea and cardiomediastinal silhouette are within normal limits. Lungs are clear.     IMPRESSION: No acute pulmonary process.       ED Course/Medical Decision Making   Patient was given a total of 2 DuoNeb while he was in the ER and 60 mg of prednisone.  Patient was reevaluated after his treatments and he had cleared very nicely.  I discussed with patient I would represcribed him a new inhaler and prednisone for the next 4 days.  He is advised to take the prednisone in the morning along with food.  Patient also has a history of hypertension and is not taking his blood pressure medicine due to losing his insurance.  A life coach consult was placed to help him follow up with her primary care.  Patient is being discharged home with a prescription for pro-air inhaler and prednisone.  Medications   albuterol-ipratropium (DUO-NEB) 2.5 MG-0.5 MG/3 ML (3 mL Nebulization Given 01/27/16 1008)   albuterol-ipratropium (DUO-NEB) 2.5 MG-0.5 MG/3 ML (3 mL Nebulization Given 01/27/16 1056)   predniSONE (DELTASONE) tablet 60 mg (60 mg Oral Given 01/27/16 1034)     Final Diagnosis       ICD-10-CM ICD-9-CM   1. Moderate asthma with acute exacerbation, unspecified whether persistent J45.901 493.92       Disposition   Home   Discussed patient, findings, and treatments with Smitty CordsERIK H KISA, MD prior to discharge and they are in agreement with treatment and discharge plan.    Caleen Jobshristine M Montez Stryker, NP  January 27, 2016     My signature above authenticates this document and my orders, the final ??  diagnosis (es), discharge prescription (s), and  instructions in the Epic ??  record.  If you have any questions please contact [phone].  ??  Nursing notes have been reviewed by the physician/ advanced practice ??  Clinician.    Dragon medical dictation software was used for portions of this report.  Unintended transcription errors may occur.

## 2016-01-31 NOTE — Telephone Encounter (Signed)
Pt was in [place] over the weekend.  LC called and spoke to pt.  Shared with pt that I would like to get him connected to [facility] for his asthma & HTN.  Pt accepted offer to go to [facility].  Explained to pt that [facility] will see him for 60 days and [facility] [place].  Sent to pt in the mail:  cceva info, script savings card & financial app along with list of [place] clinics & the dec cav schedule.  Sent fax to [facility].

## 2016-02-02 NOTE — Telephone Encounter (Signed)
[facility]15a.m.  LC called pt na lm on vm telling him appt date and time and to please call me to confirm.  Sent appt reminder letter in the mail.

## 2016-02-07 NOTE — Telephone Encounter (Signed)
Pt called and lm on vm confirming [facility]

## 2016-02-07 NOTE — Telephone Encounter (Signed)
LC called and spoke to pt reminded [facility]  Pt states that he will be there.

## 2016-02-14 ENCOUNTER — Inpatient Hospital Stay
Admit: 2016-02-14 | Discharge: 2016-02-15 | Disposition: A | Discharge status: Home / Self Care | Payer: Self-pay | Attending: Emergency Medicine

## 2016-02-14 DIAGNOSIS — J45901 Unspecified asthma with (acute) exacerbation: Secondary | ICD-10-CM

## 2016-02-14 NOTE — ED Notes (Signed)
i have review discharge instruction and prescriptions with patient, patient verbalizes understanding

## 2016-02-14 NOTE — Telephone Encounter (Signed)
[facility]  LC called and spoke to pt to find out why he did not go to appt.  Pt misunderstood LC and went to a bldg beside [place].  Pt would like another appt with [facility].  [facility]iana an [facility]-mail for another appt.

## 2016-02-14 NOTE — ED Provider Notes (Signed)
[facility]  Emergency Department Treatment Report        Patient: Travis [name] Age: 25 y.o. Sex: male    Date of Birth: 07/05/1990 Admit Date: 02/14/2016 PCP: Hyman BibleEric Fee, MD   MRN: [id number]  CSN: [id number]  Attending: Elveria RoyalsSARA N MCCORMACK, MD   Room: [id number]/ER47 Time Dictated: 8:14 PM APP: Mel AlmondKate E Charnise Lovan, PA-C     Chief Complaint   Chief Complaint   Patient presents with   ??? Cough   ??? Wheezing       History of Present Illness   25 y.o. male with history of asthma presenting for evaluation of cough, shortness of breath and wheezing today.  Patient works in a [employer], states the ventilators for the smoke were broken, states he thinks his asthma was exacerbated by the smoky fumes.  He has been using his albuterol inhaler with minimal relief.  He does not smoke.  States these symptoms are typical for his asthma exacerbations.  Denies history of intubation or recent hospitalizations for his asthma.  Denies fevers or chills, no nausea or vomiting, no abdominal pain or diarrhea.  No chest pain or hemoptysis.    Review of Systems   Review of Systems   Constitutional: Negative for chills and fever.   HENT: Negative for congestion and sore throat.    Eyes: Negative for blurred vision and double vision.   Respiratory: Positive for cough, sputum production, shortness of breath and wheezing. Negative for hemoptysis.    Cardiovascular: Negative for chest pain and leg swelling.   Gastrointestinal: Negative for abdominal pain, diarrhea, nausea and vomiting.   Genitourinary: Negative for dysuria, frequency and urgency.   Musculoskeletal: Negative for back pain and myalgias.   Skin: Negative for rash.   Neurological: Negative for tingling, sensory change and focal weakness.       Past Medical/Surgical History     Past Medical History:   Diagnosis Date   ??? Asthma    ??? Hypertension    ??? Neurological disorder     Bell's palsy        History reviewed. No pertinent surgical history.    Social History     Social History      Social History   ??? Marital status: SINGLE     Spouse name: N/A   ??? Number of children: N/A   ??? Years of education: N/A     Occupational History   ??? Not on file.     Social History Main Topics   ??? Smoking status: Never Smoker   ??? Smokeless tobacco: Never Used   ??? Alcohol use No   ??? Drug use: No   ??? Sexual activity: Yes     Partners: Female     Birth control/ protection: Condom     Other Topics Concern   ??? Not on file     Social History Narrative       Family History   History reviewed. No pertinent family history.    Current Medications     (Not in a hospital admission)  Prior to Admission Medications   Prescriptions Last Dose Informant Patient Reported? Taking?   albuterol (PROAIR HFA) 90 mcg/actuation inhaler   No Yes   Sig: Take 1 Puff by inhalation every four (4) hours as needed for Wheezing.   fluticasone (FLONASE) 50 mcg/actuation nasal spray   No Yes   Sig: 2 Sprays by Both Nostrils route daily.      Facility-Administered Medications: None  Allergies   No Known Allergies    Physical Exam   ED Triage Vitals   Enc Vitals Group      BP 02/14/16 1830 145/95      Pulse (Heart Rate) 02/14/16 1830 97      Resp Rate 02/14/16 1830 20      Temp 02/14/16 1830 98.2 ??F (36.8 ??C)      Temp src --       O2 Sat (%) 02/14/16 1830 97 %      Weight 02/14/16 1803 193 lb      Height 02/14/16 1803 5\' 9"       Head Cir --       Peak Flow --       Pain Score --       Pain Loc --       Pain Edu? --       Excl. in GC? --      Physical Exam   Constitutional: He is oriented to person, place, and time.   Well-appearing, sitting comfortably on the stretcher   HENT:   Head: Normocephalic and atraumatic.   Mouth/Throat: Oropharynx is clear and moist.   Eyes: Conjunctivae are normal.   Neck: Normal range of motion. Neck supple.   Cardiovascular: Normal rate, regular rhythm, normal heart sounds and intact distal pulses.  Exam reveals no gallop and no friction rub.    No murmur heard.   Pulmonary/Chest: Effort normal. No respiratory distress. He has wheezes (Scattered inspiratory and expiratory wheezing throughout). He has no rales. He exhibits no tenderness.   Abdominal: Soft. He exhibits no distension. There is no tenderness. There is no rebound and no guarding.   Musculoskeletal: Normal range of motion.   Neurological: He is alert and oriented to person, place, and time.   Skin: Skin is warm and dry.   Nursing note and vitals reviewed.       Impression and Management Plan   25 year old male presenting with what is most likely an asthma exacerbation secondary to inhaling the smoky fumes in the kitchen today.  He is well-appearing, unremarkable vital signs and no respiratory distress.  He does have scattered inspiratory and expiratory wheezing on exam.  We will administer DuoNeb therapy and prednisone orally.  I do not suspect pneumonia or other acute intrathoracic etiology, I do not feel that a chest x-ray is warranted at this time.    Diagnostic Studies   Lab:   No results found for this or any previous visit (from the past 12 hour(s)).  Labs Reviewed - No data to display        ED Course/Medical Decision Making   Patient was administered DuoNeb treatment ??1 and prednisone 60 mg orally.  On reevaluation, patient states he is breathing much better, his lungs sound clear to auscultation.  He developed no new or worsening symptoms while in the ED, I felt he was stable for discharge home with a prescription for prednisone.  He is advised to continue the inhalers at home as prescribed.  Also given a work note to avoid the smoky kitchen fumes for the next few days.  He is advised to return to the emergency department for any new or worsening symptoms or other concerns.    Final Diagnosis       ICD-10-CM ICD-9-CM   1. Asthma exacerbation, mild J45.901 493.92       Disposition   Home    Fonnie Mu, PA-C  February 14, 2016  8:14  PM     The patient was personally evaluated by myself and Dr. Elveria RoyalsSARA N MCCORMACK, MD who agrees with the above assessment and plan.  Please note that portions of this note were completed with a voice recognition program. Efforts were made to edit the dictations but occasionally words are mis-transcribed.   My signature above authenticates this document and my orders, the final ??  diagnosis (es), discharge prescription (s), and instructions in the Epic ??  record.  If you have any questions please contact [phone].  ??  Nursing notes have been reviewed by the physician/ advanced practice ??  Clinician.

## 2016-02-14 NOTE — ED Triage Notes (Signed)
States yesterday was at work in [employer] and the stove fan range stopped working and the smoke caused his asthma to flair up, wheezing scattered , coughing up green mucus, no relief with inhaler,

## 2016-02-14 NOTE — ED Triage Notes (Signed)
Pt states yesterday he started having asthma exacerbation with cough and wheezing, using inhaler but is still feeling bad

## 2016-02-15 MED ORDER — IPRATROPIUM-ALBUTEROL 2.5 MG-0.5 MG/3 ML NEB SOLUTION
2.5 mg-0.5 mg/3 ml | RESPIRATORY_TRACT | Status: AC
Start: 2016-02-15 — End: 2016-02-14
  Administered 2016-02-15: via RESPIRATORY_TRACT

## 2016-02-15 MED ORDER — PREDNISONE 20 MG TAB
20 mg | ORAL_TABLET | Freq: Every day | ORAL | 0 refills | Status: AC
Start: 2016-02-15 — End: 2016-02-18

## 2016-02-15 MED ORDER — PREDNISONE 20 MG TAB
20 mg | ORAL | Status: AC
Start: 2016-02-15 — End: 2016-02-14
  Administered 2016-02-15: 01:00:00 via ORAL

## 2016-02-15 MED FILL — PREDNISONE 20 MG TAB: 20 mg | ORAL | Qty: 3

## 2016-02-15 MED FILL — IPRATROPIUM-ALBUTEROL 2.5 MG-0.5 MG/3 ML NEB SOLUTION: 2.5 mg-0.5 mg/3 ml | RESPIRATORY_TRACT | Qty: 3

## 2016-03-18 NOTE — Telephone Encounter (Signed)
[facility] and [facility] scheduled appt for pt on Friday 1/26 at 1:15p.m.  LC called and spoke to pt and shared with him the appt date and time.  Sent appt reminder letter in the mail.

## 2016-03-21 NOTE — Telephone Encounter (Signed)
LC called and spoke to pt and reminded him of his [facility] appt tomorrow.  Pt states that he will be there.

## 2016-04-09 NOTE — Telephone Encounter (Signed)
According to Jake Samplesthena pt was to see Megan on 2/5 but there are no notes.  [facility] and pt was to see Meghan, SW and provider on 2/5 but was a no show.  LC sent letter in the mail to pt.

## 2018-04-10 ENCOUNTER — Inpatient Hospital Stay
Admit: 2018-04-10 | Discharge: 2018-04-10 | Disposition: A | Discharge status: Home / Self Care | Payer: Self-pay | Attending: Emergency Medicine

## 2018-04-10 DIAGNOSIS — R21 Rash and other nonspecific skin eruption: Secondary | ICD-10-CM

## 2018-04-10 MED ORDER — PREDNISONE 20 MG TAB
20 mg | ORAL | Status: AC
Start: 2018-04-10 — End: 2018-04-10
  Administered 2018-04-10: 12:00:00 via ORAL

## 2018-04-10 MED ORDER — PREDNISONE 10 MG TABLETS IN A DOSE PACK
10 mg | ORAL_TABLET | ORAL | 0 refills | Status: DC
Start: 2018-04-10 — End: 2018-07-21

## 2018-04-10 MED ORDER — HYDROXYZINE 50 MG TAB
50 mg | ORAL_TABLET | Freq: Four times a day (QID) | ORAL | 0 refills | Status: AC | PRN
Start: 2018-04-10 — End: 2018-04-20

## 2018-04-10 MED FILL — PREDNISONE 20 MG TAB: 20 mg | ORAL | Qty: 3

## 2018-04-10 NOTE — ED Provider Notes (Signed)
[facility]  Emergency Department Treatment Report        Patient: Travis [name] IV Age: 28 y.o. Sex: male    Date of Birth: [birth year] Admit Date: 04/10/2018 PCP: None   MRN: [id number]  CSN: [id number]  Attending: Smitty Cords, MD   Room: ER29/ER29 Time Dictated: 6:15 AM [id number]       Chief Complaint   Hives for 3 days    History of Present Illness   28 y.o. male with a history of asthma coming in with complaints of times for the last 3 days.  Patient reports 3 days ago he started with dry skin, tried a new lotion called Jergens lotion.  On the second day he said the skin is more dry, and later in the day he developed a red rash again on his arms and chest with welts from scratching.  Then this morning he states that the rash and itching worsen, and moved to all over his body.  States he has been trying Benadryl with the last dose being at 4 AM as well as castor oil without relief.  Denies new medications, soaps, and detergents.  He states that he moved into a new house 2 months ago but has not had a problem until 3 days ago.  He reports he has a history of hives when he was a child due to new carpet in the house.  Additionally reports that on the first day went over to his friend's house as a dog, who has had contact with the dog in the past without issues.  Denies fevers, chills, difficulty breathing, swelling of his tongue, sore throat, shortness of breath, nausea/vomiting, abdominal pain, and rash on palms and soles of his hands and feet.    Review of Systems   Review of Systems   Constitutional: Negative for chills and fever.   HENT: Negative for sore throat.    Respiratory: Negative for shortness of breath.    Cardiovascular: Negative for chest pain.   Gastrointestinal: Negative for abdominal pain, nausea and vomiting.   Musculoskeletal: Negative for joint pain.   Skin: Positive for itching and rash.       Past Medical/Surgical History     Past Medical History:   Diagnosis Date    ??? Asthma    ??? Hypertension    ??? Neurological disorder     Bell's palsy     History reviewed. No pertinent surgical history.    Social History     Social History     Socioeconomic History   ??? Marital status: SINGLE     Spouse name: Not on file   ??? Number of children: Not on file   ??? Years of education: Not on file   ??? Highest education level: Not on file   Occupational History   ??? Not on file   Social Needs   ??? Financial resource strain: Not on file   ??? Food insecurity:     Worry: Not on file     Inability: Not on file   ??? Transportation needs:     Medical: Not on file     Non-medical: Not on file   Tobacco Use   ??? Smoking status: Never Smoker   ??? Smokeless tobacco: Never Used   Substance and Sexual Activity   ??? Alcohol use: No   ??? Drug use: No   ??? Sexual activity: Yes     Partners: Female     Birth control/protection: Condom  Lifestyle   ??? Physical activity:     Days per week: Not on file     Minutes per session: Not on file   ??? Stress: Not on file   Relationships   ??? Social connections:     Talks on phone: Not on file     Gets together: Not on file     Attends religious service: Not on file     Active member of club or organization: Not on file     Attends meetings of clubs or organizations: Not on file     Relationship status: Not on file   ??? Intimate partner violence:     Fear of current or ex partner: Not on file     Emotionally abused: Not on file     Physically abused: Not on file     Forced sexual activity: Not on file   Other Topics Concern   ??? Not on file   Social History Narrative   ??? Not on file       Family History   History reviewed. No pertinent family history.    Current Medications     Prior to Admission Medications   Prescriptions Last Dose Informant Patient Reported? Taking?   albuterol (PROAIR HFA) 90 mcg/actuation inhaler   No No   Sig: Take 1 Puff by inhalation every four (4) hours as needed for Wheezing.   fluticasone (FLONASE) 50 mcg/actuation nasal spray   No No    Sig: 2 Sprays by Both Nostrils route daily.      Facility-Administered Medications: None       Allergies   No Known Allergies    Physical Exam     ED Triage Vitals [04/10/18 0558]   ED Encounter Vitals Group      BP (!) 150/100      Pulse (Heart Rate) 75      Resp Rate 18      Temp 98.3 ??F (36.8 ??C)      Temp src       O2 Sat (%) 99 %      Weight       Height      Physical Exam  Vitals signs reviewed.   Constitutional:       General: He is not in acute distress.     Appearance: Normal appearance. He is not ill-appearing, toxic-appearing or diaphoretic.   HENT:      Head: Normocephalic and atraumatic.      Right Ear: External ear normal.      Left Ear: External ear normal.      Mouth/Throat:      Mouth: Mucous membranes are moist.      Pharynx: Oropharynx is clear. No oropharyngeal exudate or posterior oropharyngeal erythema.      Comments: No oral lesions noted  Eyes:      Conjunctiva/sclera: Conjunctivae normal.      Pupils: Pupils are equal, round, and reactive to light.   Neck:      Musculoskeletal: Normal range of motion and neck supple.   Cardiovascular:      Rate and Rhythm: Normal rate and regular rhythm.      Heart sounds: Normal heart sounds. No murmur.   Pulmonary:      Effort: Pulmonary effort is normal. No respiratory distress.      Breath sounds: Normal breath sounds. No wheezing.   Abdominal:      General: Bowel sounds are normal.      Palpations: Abdomen  is soft.      Tenderness: There is no abdominal tenderness.   Musculoskeletal: Normal range of motion.   Skin:     General: Skin is warm and dry.      Comments: Diffuse reticular rash to bilateral arms and legs  No rash on the palms or soles of his hands or feet   Neurological:      Mental Status: He is alert and oriented to person, place, and time.      Gait: Gait is intact.   Psychiatric:         Mood and Affect: Affect normal.          Impression and Management Plan   28 year old male coming complaints of a itchy rash x3 days     DDX: Allergic reaction versus contact dermatitis versus scabies versus cellulitis    She is hypertensive on arrival, but afebrile, normal cardiac, with normal O2 saturations.  On exam he is well-developed, well-nourished, nontoxic, in no apparent distress.  On exam patient with a diffuse rash to bilateral arms and legs is reticular pattern, but no urticaria appreciated.  He has no rash noted to the palms of his hands or soles of his feet, or any lesions noted to the mucous membranes of his mouth.  Had regular rate, rhythm, heart sounds.  Lungs are clear all station.  Abdomen is soft and nontender.    Plan for prednisone      Diagnostic Studies   Lab:   None    Imaging:    No results found.    ED Course     Medications   predniSONE (DELTASONE) tablet 60 mg (60 mg Oral Given 04/10/18 0640)        Medical Decision Making   Patient remained stable during his time here in the emergency room.  The remained hypertensive.  Patient reports that he is a history of hypertension, was taken off his blood pressure medications because it is well controlled.    He was given prednisone p.o. during his time here.  He was given a prescription for predisone as well as Atarax.  Advised that the Atarax can make him drowsy, and was advised not to drink, drive, or operate heavy machinery with this medication.  He was advised he needs to follow-up transitional care next week to for improvement of his symptoms as well as the high blood pressure during his time here to which he verbalized understanding.  He was educated and verbalized understanding to return immediately for any new or worsening symptoms.  He is amenable to plan of discharge.    Final Diagnosis       ICD-10-CM ICD-9-CM   1. Rash without hives R21 782.1       Disposition   Dicharge home    Current Discharge Medication List      START taking these medications    Details   predniSONE (STERAPRED DS) 10 mg dose pack Take per instructions on the package  Qty: 21 Tab, Refills: 0       hydroxyzine HCL (ATARAX) 50 mg tablet Take 1 Tab by mouth every six (6) hours as needed for Itching for up to 10 days.  Qty: 20 Tab, Refills: 0             The patient was personally evaluated by myself and Dr. Arvella Merles, Wetzel Bjornstad, MD who agrees with the above assessment and plan.    Vanessa Kick, PA-C  April 10, 2018  My signature above authenticates this document and my orders, the final    diagnosis (es), discharge prescription (s), and instructions in the Epic    record.  If you have any questions please contact [phone].     Nursing notes have been reviewed by the physician/ advanced practice    Clinician.    Dragon medical dictation software was used for portions of this report. Unintended voice recognition errors may occur.

## 2018-04-10 NOTE — ED Triage Notes (Signed)
Pt with noted hives states they began two days ago.

## 2018-04-10 NOTE — ED Provider Notes (Signed)
ED Provider Notes by Vanessa KickBerscak, Tiyanna Larcom, PA at 04/10/18 0615                Author: Vanessa KickBerscak, Dalten Ambrosino, PA  Service: Emergency Medicine  Author Type: Physician Assistant       Filed: 04/10/18 0711  Date of Service: 04/10/18 0615  Status: Attested           Editor: Vanessa KickBerscak, Leilani Cespedes, PA (Physician Assistant)  Cosigner: Smitty CordsKisa, Erik H, MD at 04/10/18 0830          Attestation signed by Smitty CordsKisa, Erik H, MD at 04/10/18 0830          I, Smitty CordsErik H Kisa, MD , have personally seen and examined this patient; I have fully participated in the care of this patient with the advanced practice provider.   I have reviewed and agree with all pertinent clinical information including history, physical exam,  and the plan.  I have also reviewed and agree with the medications, allergies and past medical history sections for this patient.   My examination he has diffuse hives over his extremities and trunk.  I explained to him that he may be sensitive to the lotion he was using for dry skin he will stop using.  Laced on prednisone now first dose given here.  He also will take Atarax for  itching.  Work note given.   Smitty CordsErik H Kisa, MD   April 10, 2018                                    [facility]   Emergency Department Treatment Report                Patient: Travis [name]  Age: 28 y.o.  Sex: male          Date of Birth: 12/25/1990  Admit Date: 04/10/2018  PCP: None     MRN: [id number]   CSN: [id number]   Attending: Smitty CordsKISA, ERIK H, MD         Room: ER29/ER29  Time Dictated: 6:15 AM  [id number]:Ericah Scotto           Chief Complaint    Hives for 3 days        History of Present Illness     28 y.o. male  with a history of asthma coming in with complaints of times for the last 3 days.  Patient reports 3 days ago he started with dry skin, tried a new lotion called Jergens lotion.  On the second day he said the skin is more dry, and later in the day he  developed a red rash again on his arms and chest with welts from scratching.  Then this  morning he states that the rash and itching worsen, and moved to all over his body.  States he has been trying Benadryl with the last dose being at 4 AM as well as  castor oil without relief.  Denies new medications, soaps, and detergents.  He states that he moved into a new house 2 months ago but has not had a problem until 3 days ago.  He reports he has a history of hives when he was a child due to new carpet in  the house.  Additionally reports that on the first day went over to his friend's house as a dog, who has had contact with the dog in the past without issues.  Denies fevers, chills, difficulty breathing, swelling of his tongue, sore throat, shortness  of breath, nausea/vomiting, abdominal pain, and rash on palms and soles of his hands and feet.        Review of Systems     Review of Systems    Constitutional: Negative for chills and fever.    HENT: Negative for sore throat.     Respiratory: Negative for shortness of breath.     Cardiovascular: Negative for chest pain.    Gastrointestinal: Negative for abdominal pain, nausea and vomiting.    Musculoskeletal: Negative for joint pain.    Skin: Positive for itching and rash .            Past Medical/Surgical History          Past Medical History:        Diagnosis  Date         ?  Asthma       ?  Hypertension       ?  Neurological disorder            Bell's palsy        History reviewed. No pertinent surgical history.        Social History          Social History          Socioeconomic History         ?  Marital status:  SINGLE              Spouse name:  Not on file         ?  Number of children:  Not on file     ?  Years of education:  Not on file     ?  Highest education level:  Not on file       Occupational History        ?  Not on file       Social Needs         ?  Financial resource strain:  Not on file        ?  Food insecurity:              Worry:  Not on file         Inability:  Not on file        ?  Transportation needs:              Medical:  Not on  file         Non-medical:  Not on file       Tobacco Use         ?  Smoking status:  Never Smoker     ?  Smokeless tobacco:  Never Used       Substance and Sexual Activity         ?  Alcohol use:  No     ?  Drug use:  No     ?  Sexual activity:  Yes              Partners:  Female         Birth control/protection:  Condom       Lifestyle        ?  Physical activity:              Days per week:  Not on file         Minutes per session:  Not on file         ?  Stress:  Not on file       Relationships        ?  Social connections:              Talks on phone:  Not on file         Gets together:  Not on file         Attends religious service:  Not on file         Active member of club or organization:  Not on file              Attends meetings of clubs or organizations:  Not on file              Relationship status:  Not on file        ?  Intimate partner violence:              Fear of current or ex partner:  Not on file         Emotionally abused:  Not on file         Physically abused:  Not on file         Forced sexual activity:  Not on file        Other Topics  Concern        ?  Not on file       Social History Narrative        ?  Not on file             Family History     History reviewed. No pertinent family history.        Current Medications          Prior to Admission Medications     Prescriptions  Last Dose  Informant  Patient Reported?  Taking?      albuterol (PROAIR HFA) 90 mcg/actuation inhaler      No  No      Sig: Take 1 Puff by inhalation every four (4) hours as needed for Wheezing.      fluticasone (FLONASE) 50 mcg/actuation nasal spray      No  No      Sig: 2 Sprays by Both Nostrils route daily.               Facility-Administered Medications: None             Allergies     No Known Allergies        Physical Exam          ED Triage Vitals [04/10/18 0558]     ED Encounter Vitals Group           BP  (!) 150/100        Pulse (Heart Rate)  75        Resp Rate  18        Temp  98.3 ??F (36.8 ??C)        Temp  src          O2 Sat (%)  99 %        Weight             Height          Physical Exam   Vitals signs reviewed.   Constitutional:        General: He is not in acute distress.     Appearance: Normal appearance. He is not ill-appearing, toxic-appearing or diaphoretic.    HENT:  Head: Normocephalic and atraumatic.      Right Ear: External ear normal.      Left Ear: External ear normal.      Mouth/Throat:      Mouth: Mucous membranes are moist.      Pharynx: Oropharynx is clear. No oropharyngeal exudate  or posterior oropharyngeal erythema.      Comments: No oral lesions noted  Eyes:       Conjunctiva/sclera: Conjunctivae normal.      Pupils: Pupils are equal, round, and reactive to light.    Neck:       Musculoskeletal: Normal range of motion and neck supple.   Cardiovascular :       Rate and Rhythm: Normal rate and regular rhythm.      Heart sounds: Normal heart sounds. No murmur.    Pulmonary:       Effort: Pulmonary effort is normal. No respiratory distress.      Breath sounds: Normal breath sounds. No wheezing.    Abdominal:      General: Bowel sounds are normal.      Palpations: Abdomen is soft.      Tenderness: There is no abdominal tenderness.     Musculoskeletal: Normal range of motion.    Skin:      General: Skin is warm and dry.      Comments: Diffuse reticular rash to bilateral arms and legs  No rash on the palms or soles of his hands or feet    Neurological :       Mental Status: He is alert and oriented to person, place, and time.      Gait: Gait is intact.    Psychiatric:         Mood and Affect: Affect normal.                Impression and Management Plan     28 year old male coming complaints of a itchy rash x3 days      DDX: Allergic reaction versus contact dermatitis versus scabies versus cellulitis      She is hypertensive on arrival, but afebrile, normal cardiac, with normal O2 saturations.  On exam he is well-developed, well-nourished, nontoxic, in no apparent distress.  On exam patient with a  diffuse rash to bilateral arms and legs is reticular pattern,  but no urticaria appreciated.  He has no rash noted to the palms of his hands or soles of his feet, or any lesions noted to the mucous membranes of his mouth.  Had regular rate, rhythm, heart sounds.  Lungs are clear all station.  Abdomen is soft and  nontender.      Plan for prednisone           Diagnostic Studies     Lab:    None      Imaging:     No results found.        ED Course          Medications       predniSONE (DELTASONE) tablet 60 mg (60 mg Oral Given 04/10/18 0640)              Medical Decision Making     Patient remained stable during his time here in the emergency room.  The remained hypertensive.  Patient reports that he is a history of hypertension, was taken off his blood pressure  medications because it is well controlled.      He was given prednisone p.o. during his  time here.  He was given a prescription for predisone as well as Atarax.  Advised that the Atarax can make him drowsy, and was advised not to drink, drive, or operate heavy machinery with this medication.  He was  advised he needs to follow-up transitional care next week to for improvement of his symptoms as well as the high blood pressure during his time here to which he verbalized understanding.  He was educated and verbalized understanding to return immediately  for any new or worsening symptoms.  He is amenable to plan of discharge.        Final Diagnosis                 ICD-10-CM  ICD-9-CM          1.  Rash without hives  R21  782.1             Disposition     Dicharge home        Current Discharge Medication List              START taking these medications          Details        predniSONE (STERAPRED DS) 10 mg dose pack  Take per instructions on the package   Qty: 21 Tab, Refills:  0               hydroxyzine HCL (ATARAX) 50 mg tablet  Take 1 Tab by mouth every six (6) hours as needed for Itching for up to 10 days.   Qty: 20 Tab, Refills:  0                          The patient was personally evaluated by myself and Dr. Arvella Merles, Wetzel Bjornstad, MD who agrees with the above assessment and plan.      Vanessa Kick, PA-C   April 10, 2018      My signature above authenticates this document and my orders, the final     diagnosis (es), discharge prescription (s), and instructions in the Epic     record.   If you have any questions please contact [phone].       Nursing notes have been reviewed by the physician/ advanced practice     Clinician.      Dragon medical dictation software was used for portions of this report. Unintended voice recognition errors may occur.

## 2018-04-10 NOTE — ED Notes (Signed)
Pt with noted hives states they began two days ago.

## 2018-07-21 DIAGNOSIS — N179 Acute kidney failure, unspecified: Secondary | ICD-10-CM

## 2018-07-21 NOTE — ED Notes (Addendum)
Urinal at bedside. Pt in low bed, no distress noted.

## 2018-07-21 NOTE — ED Notes (Signed)
Report from Mariama,RN.

## 2018-07-21 NOTE — ED Notes (Signed)
PA at bedside.

## 2018-07-21 NOTE — ED Triage Notes (Signed)
Pt brought in by EMS from home with c/o of abd pain with n/v for the last 24 hours.  Pt last ate soup at 2000, but was unable to keep it down.  Reports being unable to keep water down.  BP 135/101, HR 116, O2 96 RA, T 97

## 2018-07-21 NOTE — ED Notes (Signed)
Urinal at bedside. Pt in low bed, no distress noted.

## 2018-07-21 NOTE — ED Notes (Signed)
Pt brought in by EMS from home with c/o of abd pain with n/v for the last 24 hours.  Pt last ate soup at 2000, but was unable to keep it down.  Reports being unable to keep water down.  BP 135/101, HR 116, O2 96 RA, T 97

## 2018-07-22 ENCOUNTER — Emergency Department: Admit: 2018-07-22 | Payer: MEDICAID | Primary: Family Medicine

## 2018-07-22 ENCOUNTER — Inpatient Hospital Stay
Admit: 2018-07-22 | Discharge: 2018-07-31 | Disposition: A | Discharge status: Home / Self Care | Payer: MEDICAID | Attending: Internal Medicine | Admitting: Internal Medicine

## 2018-07-22 LAB — CBC WITH AUTO DIFFERENTIAL
Basophils %: 0.2 % (ref 0–3)
Eosinophils %: 0.4 % (ref 0–5)
Hematocrit: 52.2 % — ABNORMAL HIGH (ref 37.0–50.0)
Hemoglobin: 17.8 gm/dl — ABNORMAL HIGH (ref 12.4–17.2)
Immature Granulocytes: 0.4 % (ref 0.0–3.0)
Lymphocytes %: 21.5 % — ABNORMAL LOW (ref 28–48)
MCH: 28.6 pg (ref 23.0–34.6)
MCHC: 34.1 gm/dl (ref 30.0–36.0)
MCV: 83.9 fL (ref 80.0–98.0)
MPV: 13.6 fL — ABNORMAL HIGH (ref 6.0–10.0)
Monocytes %: 12.9 % (ref 1–13)
Neutrophils %: 64.6 % — ABNORMAL HIGH (ref 34–64)
Nucleated RBCs: 0 (ref 0–0)
Platelets: 183 10*3/uL (ref 140–450)
RBC: 6.22 M/uL — ABNORMAL HIGH (ref 3.80–5.70)
RDW-SD: 37.8 (ref 35.1–43.9)
WBC: 5.2 10*3/uL (ref 4.0–11.0)

## 2018-07-22 LAB — URINALYSIS W/ RFLX MICROSCOPIC
Bilirubin, Urine: NEGATIVE
Bilirubin: NEGATIVE
Glucose, Ur: NEGATIVE mg/dl
Glucose: NEGATIVE mg/dl
Ketone: NEGATIVE mg/dl
Ketones, Urine: NEGATIVE mg/dl
Leukocyte Esterase, Urine: NEGATIVE
Leukocyte Esterase: NEGATIVE
Nitrite, Urine: NEGATIVE
Nitrites: NEGATIVE
Protein, UA: 30 mg/dl — AB
Protein: 30 mg/dl — AB
Specific Gravity, UA: 1.025 (ref 1.005–1.030)
Specific gravity: 1.025 (ref 1.005–1.030)
Urobilinogen, UA, POCT: 0.2 mg/dl (ref 0.0–1.0)
Urobilinogen: 0.2 mg/dl (ref 0.0–1.0)
pH (UA): 6 (ref 5.0–9.0)
pH, UA: 6 (ref 5.0–9.0)

## 2018-07-22 LAB — POC URINE MACROSCOPIC
Bilirubin, Urine: NEGATIVE
Bilirubin: NEGATIVE
Glucose, Ur: NEGATIVE mg/dl
Glucose: NEGATIVE mg/dl
Ketone: NEGATIVE mg/dl
Ketones, Urine: NEGATIVE mg/dl
Leukocyte Esterase, Urine: NEGATIVE
Leukocyte Esterase: NEGATIVE
Nitrite, Urine: NEGATIVE
Nitrites: NEGATIVE
Protein, UA: 100 mg/dl — AB
Protein: 100 mg/dl — AB
Specific Gravity, UA: >=1.03 (ref 1.005–1.030)
Specific gravity: >=1.03 (ref 1.005–1.030)
Urobilinogen, UA, POCT: 0.2 EU/dl (ref 0.0–1.0)
Urobilinogen: 0.2 EU/dl (ref 0.0–1.0)
pH (UA): 6 (ref 5–9)
pH, UA: 6 (ref 5–9)

## 2018-07-22 LAB — COMPREHENSIVE METABOLIC PANEL
ALT: 23 U/L (ref 12–78)
AST: 18 U/L (ref 15–37)
Albumin: 4.8 gm/dl (ref 3.4–5.0)
Alkaline Phosphatase: 87 U/L (ref 45–117)
Anion Gap: 16 mmol/L — ABNORMAL HIGH (ref 5–15)
BUN: 38 mg/dl — ABNORMAL HIGH (ref 7–25)
CO2: 13 mEq/L — CL (ref 21–32)
Calcium: 9.2 mg/dl (ref 8.5–10.1)
Chloride: 107 mEq/L (ref 98–107)
Creatinine: 4 mg/dl — ABNORMAL HIGH (ref 0.6–1.3)
EGFR IF NonAfrican American: 19
GFR African American: 23
Glucose: 123 mg/dl — ABNORMAL HIGH (ref 74–106)
Potassium: 3.6 mEq/L (ref 3.5–5.1)
Sodium: 135 mEq/L — ABNORMAL LOW (ref 136–145)
Total Bilirubin: 1.1 mg/dl — ABNORMAL HIGH (ref 0.2–1.0)
Total Protein: 11.3 gm/dl — ABNORMAL HIGH (ref 6.4–8.2)

## 2018-07-22 LAB — POC URINE MICROSCOPIC
Hyaline Casts, UA: >50 /LPF
Hyaline cast: >50 /LPF

## 2018-07-22 LAB — MAGNESIUM
Magnesium: 2.4 mg/dl (ref 1.6–2.6)
Magnesium: 2.4 mg/dl (ref 1.6–2.6)

## 2018-07-22 LAB — C. DIFFICILE/EPI PCR
C. DIFF TOXIN BY PCR: NEGATIVE
C. diff toxin by PCR: NEGATIVE

## 2018-07-22 LAB — COVID-19: COVID-19: NEGATIVE

## 2018-07-22 LAB — LIPASE
Lipase: 67 U/L — ABNORMAL LOW (ref 73–393)
Lipase: 67 U/L — ABNORMAL LOW (ref 73–393)

## 2018-07-22 LAB — METABOLIC PANEL, COMPREHENSIVE
ALT (SGPT): 23 U/L (ref 12–78)
AST (SGOT): 18 U/L (ref 15–37)
Albumin: 4.8 gm/dl (ref 3.4–5.0)
Alk. phosphatase: 87 U/L (ref 45–117)
Anion gap: 16 mmol/L — ABNORMAL HIGH (ref 5–15)
BUN: 38 mg/dl — ABNORMAL HIGH (ref 7–25)
Bilirubin, total: 1.1 mg/dl — ABNORMAL HIGH (ref 0.2–1.0)
CO2: 13 mEq/L — CL (ref 21–32)
Calcium: 9.2 mg/dl (ref 8.5–10.1)
Chloride: 107 mEq/L (ref 98–107)
Creatinine: 4 mg/dl — ABNORMAL HIGH (ref 0.6–1.3)
GFR est AA: 23
GFR est non-AA: 19
Glucose: 123 mg/dl — ABNORMAL HIGH (ref 74–106)
Potassium: 3.6 mEq/L (ref 3.5–5.1)
Protein, total: 11.3 gm/dl — ABNORMAL HIGH (ref 6.4–8.2)
Sodium: 135 mEq/L — ABNORMAL LOW (ref 136–145)

## 2018-07-22 LAB — CBC WITH AUTOMATED DIFF
BASOPHILS: 0.2 % (ref 0–3)
EOSINOPHILS: 0.4 % (ref 0–5)
HCT: 52.2 % — ABNORMAL HIGH (ref 37.0–50.0)
HGB: 17.8 gm/dl — ABNORMAL HIGH (ref 12.4–17.2)
IMMATURE GRANULOCYTES: 0.4 % (ref 0.0–3.0)
LYMPHOCYTES: 21.5 % — ABNORMAL LOW (ref 28–48)
MCH: 28.6 pg (ref 23.0–34.6)
MCHC: 34.1 gm/dl (ref 30.0–36.0)
MCV: 83.9 fL (ref 80.0–98.0)
MONOCYTES: 12.9 % (ref 1–13)
MPV: 13.6 fL — ABNORMAL HIGH (ref 6.0–10.0)
NEUTROPHILS: 64.6 % — ABNORMAL HIGH (ref 34–64)
NRBC: 0 (ref 0–0)
PLATELET: 183 10*3/uL (ref 140–450)
RBC: 6.22 M/uL — ABNORMAL HIGH (ref 3.80–5.70)
RDW-SD: 37.8 (ref 35.1–43.9)
WBC: 5.2 10*3/uL (ref 4.0–11.0)

## 2018-07-22 LAB — COVID-19 (INPATIENT TESTING): COVID-19: NEGATIVE

## 2018-07-22 MED ORDER — SODIUM CHLORIDE 0.9 % IV
INTRAVENOUS | Status: AC
Start: 2018-07-22 — End: 2018-07-22
  Administered 2018-07-22 (×2): via INTRAVENOUS

## 2018-07-22 MED ORDER — SODIUM CHLORIDE 0.9 % IJ SYRG
Freq: Once | INTRAMUSCULAR | Status: AC
Start: 2018-07-22 — End: 2018-07-22

## 2018-07-22 MED ORDER — ALBUTEROL SULFATE 0.083 % (0.83 MG/ML) SOLN FOR INHALATION
2.5 mg /3 mL (0.083 %) | RESPIRATORY_TRACT | Status: DC | PRN
Start: 2018-07-22 — End: 2018-07-31

## 2018-07-22 MED ORDER — NALOXONE 0.4 MG/ML INJECTION
0.4 mg/mL | INTRAMUSCULAR | Status: DC | PRN
Start: 2018-07-22 — End: 2018-07-31

## 2018-07-22 MED ORDER — ONDANSETRON (PF) 4 MG/2 ML INJECTION
4 mg/2 mL | Freq: Once | INTRAMUSCULAR | Status: AC
Start: 2018-07-22 — End: 2018-07-21
  Administered 2018-07-22: 04:00:00 via INTRAVENOUS

## 2018-07-22 MED ORDER — PIPERACILLIN-TAZOBACTAM 3.375 GRAM IV SOLR
3.375 gram | INTRAVENOUS | Status: AC
Start: 2018-07-22 — End: 2018-07-22
  Administered 2018-07-22: 05:00:00 via INTRAVENOUS

## 2018-07-22 MED ORDER — SODIUM CHLORIDE 0.9 % IV PIGGY BACK
3.375 gram | Freq: Four times a day (QID) | INTRAVENOUS | Status: DC
Start: 2018-07-22 — End: 2018-07-22

## 2018-07-22 MED ORDER — PIPERACILLIN-TAZOBACTAM-DEXTROSE (ISO) 3.375 GRAM/50 ML IV PIGGY BACK
3.375 gram/50 mL | Freq: Three times a day (TID) | INTRAVENOUS | Status: DC
Start: 2018-07-22 — End: 2018-07-25
  Administered 2018-07-22 – 2018-07-25 (×10): via INTRAVENOUS

## 2018-07-22 MED ORDER — SODIUM CHLORIDE 0.9 % IJ SYRG
INTRAMUSCULAR | Status: DC | PRN
Start: 2018-07-22 — End: 2018-07-31
  Administered 2018-07-23: 13:00:00 via INTRAVENOUS

## 2018-07-22 MED ORDER — ONDANSETRON (PF) 4 MG/2 ML INJECTION
4 mg/2 mL | Freq: Four times a day (QID) | INTRAMUSCULAR | Status: DC | PRN
Start: 2018-07-22 — End: 2018-07-31
  Administered 2018-07-22 – 2018-07-26 (×5): via INTRAVENOUS

## 2018-07-22 MED ORDER — ACETAMINOPHEN 325 MG TABLET
325 mg | Freq: Four times a day (QID) | ORAL | Status: DC | PRN
Start: 2018-07-22 — End: 2018-07-24
  Administered 2018-07-22 – 2018-07-24 (×6): via ORAL

## 2018-07-22 MED ORDER — DICYCLOMINE 10 MG/ML IM SOLN
10 mg/mL | INTRAMUSCULAR | Status: AC
Start: 2018-07-22 — End: 2018-07-21
  Administered 2018-07-22: 04:00:00 via INTRAMUSCULAR

## 2018-07-22 MED ORDER — SODIUM CHLORIDE 0.9% BOLUS IV
0.9 % | INTRAVENOUS | Status: AC
Start: 2018-07-22 — End: 2018-07-22
  Administered 2018-07-22: 04:00:00 via INTRAVENOUS

## 2018-07-22 MED ORDER — SODIUM CHLORIDE 0.9% BOLUS IV
0.9 % | INTRAVENOUS | Status: AC
Start: 2018-07-22 — End: 2018-07-22
  Administered 2018-07-22: 05:00:00 via INTRAVENOUS

## 2018-07-22 MED ORDER — SODIUM CHLORIDE 0.9 % IJ SYRG
Freq: Three times a day (TID) | INTRAMUSCULAR | Status: DC
Start: 2018-07-22 — End: 2018-07-31
  Administered 2018-07-23 – 2018-07-31 (×27): via INTRAVENOUS

## 2018-07-22 MED FILL — ZOSYN 3.375 GRAM/50 ML IN DEXTROSE (ISO-OSMOTIC) INTRAVENOUS PIGGYBACK: 3.375 gram/50 mL | INTRAVENOUS | Qty: 50

## 2018-07-22 MED FILL — ONDANSETRON (PF) 4 MG/2 ML INJECTION: 4 mg/2 mL | INTRAMUSCULAR | Qty: 2

## 2018-07-22 MED FILL — BD POSIFLUSH NORMAL SALINE 0.9 % INJECTION SYRINGE: INTRAMUSCULAR | Qty: 10

## 2018-07-22 MED FILL — ACETAMINOPHEN 325 MG TABLET: 325 mg | ORAL | Qty: 2

## 2018-07-22 MED FILL — PIPERACILLIN-TAZOBACTAM 3.375 GRAM IV SOLR: 3.375 gram | INTRAVENOUS | Qty: 3.38

## 2018-07-22 MED FILL — BENTYL 10 MG/ML INTRAMUSCULAR SOLUTION: 10 mg/mL | INTRAMUSCULAR | Qty: 2

## 2018-07-22 MED FILL — SODIUM CHLORIDE 0.9 % IV: INTRAVENOUS | Qty: 1000

## 2018-07-22 NOTE — ED Provider Notes (Signed)
[facility]  Emergency Department Treatment Report        Patient: Travis [name] Age: 28 y.o. Sex: male    Date of Birth: Jun 11, 1990 Admit Date: 07/21/2018 PCP: None   MRN: [id number]  CSN: [id number]  Attending: Mora Bellman, MD   Room: ER02/ER02 Time Dictated: 12:08 AM APP: Fredda Hammed, PA-C     Chief Complaint   Chief Complaint   Patient presents with   ??? Abdominal Pain   ??? Vomiting            History of Present Illness   28 y.o. male who presents for evaluation of abdominal pain, nausea, vomiting and diarrhea that patient states started about 24 hours ago.  Patient reports abdominal cramping that was initially constant however resolved this evening so he ate some soup and then vomited again and the abdominal pain returned.  He denies aggravating or alleviating factors.  He reports several episodes of nonbloody emesis and reports 5-10 episodes of nonbloody diarrhea since yesterday.  He reports the day before the symptoms started, he had a poke bowl and is unsure if this is related. he denies fevers or chills, irritative voiding symptoms, known sick contacts.  He denies prior abdominal surgeries.  He reports he is exceptionally thirsty at this time but denies polydipsia prior to today and denies polyuria.  He reports prior to 24 hours ago he had been feeling well with no complaints.  He denies cough, chest pain, shortness of breath.  He reports occasional alcohol use. He has no other complaints at this time.    Review of Systems   Review of Systems   Constitutional: Negative for chills and fever.   HENT: Positive for congestion. Negative for sore throat.    Eyes: Negative for blurred vision and double vision.   Respiratory: Negative for cough and shortness of breath.    Cardiovascular: Negative for chest pain and leg swelling.   Gastrointestinal: Positive for abdominal pain, diarrhea, nausea and vomiting. Negative for blood in stool and melena.    Genitourinary: Negative for dysuria and frequency.   Musculoskeletal: Negative for myalgias and neck pain.   Skin: Negative for rash.   Neurological: Negative for dizziness and headaches.       Past Medical/Surgical History     Past Medical History:   Diagnosis Date   ??? Asthma    ??? Hypertension    ??? Neurological disorder     Bell's palsy     History reviewed. No pertinent surgical history.    Social History     Social History     Socioeconomic History   ??? Marital status: SINGLE     Spouse name: Not on file   ??? Number of children: Not on file   ??? Years of education: Not on file   ??? Highest education level: Not on file   Occupational History   ??? Not on file   Social Needs   ??? Financial resource strain: Not on file   ??? Food insecurity     Worry: Not on file     Inability: Not on file   ??? Transportation needs     Medical: Not on file     Non-medical: Not on file   Tobacco Use   ??? Smoking status: Never Smoker   ??? Smokeless tobacco: Never Used   Substance and Sexual Activity   ??? Alcohol use: No   ??? Drug use: No   ??? Sexual activity:  Yes     Partners: Female     Birth control/protection: Condom   Lifestyle   ??? Physical activity     Days per week: Not on file     Minutes per session: Not on file   ??? Stress: Not on file   Relationships   ??? Social Product manager on phone: Not on file     Gets together: Not on file     Attends religious service: Not on file     Active member of club or organization: Not on file     Attends meetings of clubs or organizations: Not on file     Relationship status: Not on file   ??? Intimate partner violence     Fear of current or ex partner: Not on file     Emotionally abused: Not on file     Physically abused: Not on file     Forced sexual activity: Not on file   Other Topics Concern   ??? Not on file   Social History Narrative   ??? Not on file       Family History   History reviewed. No pertinent family history.    Current Medications   (Not in a hospital admission)      Allergies    No Known Allergies    Physical Exam     ED Triage Vitals [07/21/18 2230]   ED Encounter Vitals Group      BP 130/90      Pulse (Heart Rate) (!) 115      Resp Rate 18      Temp 98.1 ??F (36.7 ??C)      Temp src       O2 Sat (%) 99 %      Weight 155 lb 1.6 oz      Height 5' 9"        Constitutional: Patient appears well developed and well nourished. Appears nontoxic, but uncomfortable appearing.  HENT: Normal cephalic and atraumatic.  Mucous membranes very dry, non-erythematous and without lesions. Uvula is midline.  Eyes: Conjunctivae are clear with no discharge.  Neck: Normal ROM and non tender. Supple. No nuchal rigidity.   Respiratory: Breath sounds are equal bilaterally. Lungs clear to auscultation with nonlabored respirations. No tachypnea or accessory muscle use.  Cardiovascular: Tachycardic with normal rhythm.  Distal pulses intact and symmetric. Calves soft and non-tender.  Gastrointestinal: Bowel sounds normal. Abdomen soft and without complaint of pain to palpation. No distention. No masses palpable.  Pitting edema of abdomen  Musculoskeletal: Moves all extremities well. No lower extremity edema.   Lymphatic: No cervical adenopathy.  Integumentary: Warm and dry without rashes or erythema.  Neurologic: Alert and oriented. No facial asymmetry or dysarthria.  Psychological: Behavior is age and situation appropriate. No difficulty with memory.      Impression and Management Plan   This is a 28 y.o. male with nausea, vomiting, diarrhea and abdominal pain that has been ongoing for the past 24 hours.  Patient is afebrile, normotensive and tachycardic on arrival.  On exam, patient is nontoxic-appearing but appears uncomfortable.  Mucous membranes are very dry.  Abdomen is soft and without tenderness.  There is pitting edema of the abdominal skin however no lower extremity edema.  Lungs are clear to auscultation bilaterally. Will obtain appropriate studies to evaluate the  patient's complaints and treat symptomatically.     Diagnostic Studies   Lab:   Recent Results (from the past 24  hour(s))   CBC WITH AUTOMATED DIFF    Collection Time: 07/21/18 10:42 PM   Result Value Ref Range    WBC 5.2 4.0 - 11.0 1000/mm3    RBC 6.22 (H) 3.80 - 5.70 M/uL    HGB 17.8 (H) 12.4 - 17.2 gm/dl    HCT 52.2 (H) 37.0 - 50.0 %    MCV 83.9 80.0 - 98.0 fL    MCH 28.6 23.0 - 34.6 pg    MCHC 34.1 30.0 - 36.0 gm/dl    PLATELET 183 140 - 450 1000/mm3    MPV 13.6 (H) 6.0 - 10.0 fL    RDW-SD 37.8 35.1 - 43.9      NRBC 0 0 - 0      IMMATURE GRANULOCYTES 0.4 0.0 - 3.0 %    NEUTROPHILS 64.6 (H) 34 - 64 %    LYMPHOCYTES 21.5 (L) 28 - 48 %    MONOCYTES 12.9 1 - 13 %    EOSINOPHILS 0.4 0 - 5 %    BASOPHILS 0.2 0 - 3 %   METABOLIC PANEL, COMPREHENSIVE    Collection Time: 07/21/18 10:42 PM   Result Value Ref Range    Sodium 135 (L) 136 - 145 mEq/L    Potassium 3.6 3.5 - 5.1 mEq/L    Chloride 107 98 - 107 mEq/L    CO2 13 (LL) 21 - 32 mEq/L    Glucose 123 (H) 74 - 106 mg/dl    BUN 38 (H) 7 - 25 mg/dl    Creatinine 4.0 (H) 0.6 - 1.3 mg/dl    GFR est AA 23.0      GFR est non-AA 19      Calcium 9.2 8.5 - 10.1 mg/dl    AST (SGOT) 18 15 - 37 U/L    ALT (SGPT) 23 12 - 78 U/L    Alk. phosphatase 87 45 - 117 U/L    Bilirubin, total 1.1 (H) 0.2 - 1.0 mg/dl    Protein, total 11.3 (H) 6.4 - 8.2 gm/dl    Albumin 4.8 3.4 - 5.0 gm/dl    Anion gap 16 (H) 5 - 15 mmol/L   LIPASE    Collection Time: 07/21/18 10:42 PM   Result Value Ref Range    Lipase 67 (L) 73 - 393 U/L   MAGNESIUM    Collection Time: 07/21/18 10:42 PM   Result Value Ref Range    Magnesium 2.4 1.6 - 2.6 mg/dl     Labs Reviewed   CBC WITH AUTOMATED DIFF - Abnormal; Notable for the following components:       Result Value    RBC 6.22 (*)     HGB 17.8 (*)     HCT 52.2 (*)     MPV 13.6 (*)     NEUTROPHILS 64.6 (*)     LYMPHOCYTES 21.5 (*)     All other components within normal limits   METABOLIC PANEL, COMPREHENSIVE - Abnormal; Notable for the following components:     Sodium 135 (*)     CO2 13 (*)     Glucose 123 (*)     BUN 38 (*)     Creatinine 4.0 (*)     Bilirubin, total 1.1 (*)     Protein, total 11.3 (*)     Anion gap 16 (*)     All other components within normal limits   LIPASE - Abnormal; Notable for the following components:    Lipase 67 (*)  All other components within normal limits   C. DIFFICILE/EPI PCR   MAGNESIUM       Imaging:    No results found.    CT Abdomen/Pelvis     Comparison:   No comparison study available.     Findings:     The appendix is visualized and appears normal without inflammation or enlargement.   Findings demonstrate fluid-filled colonic loops . Mild wall thickening of the small bowel loops and sigmoid colon are noted. Findings are consistent with an enteritis with secondary diarrhea versus a mild enterocolitis.   No bowel obstruction.   There are normal appearing stomach, liver, gallbladder, pancreas and bilateral adrenal glands.   Normal appearing spleen is noted.   Normal kidneys appearing are noted.   Normal appearing bladder is noted.   Unremarkable aorta is noted.     Visualized lungs are grossly normal.   No aggressive bone lesion.    Impression  Findings are consistent with an enteritis with secondary diarrhea versus a mild enterocolitis.    ED Course/Medical Decision Making   Labs show normal white blood cell count.  Chemistry shows evidence of acute renal failure with creatinine of 4.0 with BUN of 38.  Potassium is 3.6.  CO2 is 13 with anion gap of 16.  The remainder of his labs are unremarkable.    CT shows findings consistent with enteritis.  No evidence of renal abnormalities or obstruction.    Consult was placed for admission and was accepted by Dr. Dawson Bills who requests to start patient on antibiotics for possible infectious diarrhea.  C. difficile is pending at this time.    Patient has not developed any new or worsening symptoms and has remained clinically stable throughout his course in the ED.    Medications    sodium chloride (NS) flush 5-10 mL (has no administration in time range)   sodium chloride 0.9 % bolus infusion 1,000 mL (0 mL IntraVENous [name] Completed 07/22/18 0030)   ondansetron (ZOFRAN) injection 4 mg (4 mg IntraVENous Given 07/21/18 2339)   dicyclomine (BENTYL) 10 mg/mL injection 20 mg (20 mg IntraMUSCular Given 07/21/18 2339)   sodium chloride 0.9 % bolus infusion 1,000 mL (1,000 mL IntraVENous New Bag 07/22/18 0104)   piperacillin-tazobactam (ZOSYN) 3.375 g in 0.9% sodium chloride (MBP/ADV) 50 mL MBP (3.375 g IntraVENous New Bag 07/22/18 0103)       Final Diagnosis       ICD-10-CM ICD-9-CM   1. AKI (acute kidney injury) ([place]) N17.9 584.9   2. Enteritis K52.9 558.9       Disposition   Admitted in stable condition.    Current Discharge Medication List          Patient has been evaluated by myself and ROMASH, JONATHAN A, MD who agrees with the above assessment and plan.    Loralyn Freshwater Mickala Laton, PA-C  Jul 22, 2018  12:08 AM      My signature above authenticates this document and my orders, the final diagnosis(es), discharge prescription(s), and instructions in the Epic record. If you have any questions please contact [phone].     Nursing notes have been reviewed by the physician and advanced practice clinician.

## 2018-07-22 NOTE — ED Notes (Addendum)
0730  Bedside and Verbal shift change report given to Adele Schilder   (oncoming nurse) by Maralyn Sago, RN  (offgoing nurse). Report included the following information ED Summary.   [id number]  Patient alert.  Call light within reach.  Patient c/o pain.   Will medicate.   Aware of the plan of care.  White Board is updated and accurate.   Awaiting all results and disposition.   2330  Page Sent      PAGER ID: [phone]   MESSAGE: ER02. [name]. Orders for patient to be NPO after midnight? Patient requesting to eat/drink. Does patient have procedure? Lupe Carney, ER  [phone]  TRANSFER - OUT REPORT:    Verbal report given to Elnita Maxwell, RN (name) on Travis [name]  being transferred to 4224(unit) for routine progression of care       Report consisted of patient???s Situation, Background, Assessment and   Recommendations(SBAR).     Information from the following report(s) ED Summary was reviewed with the receiving nurse.    Lines:   Peripheral [name] 07/21/18 Left Antecubital (Active)   Site Assessment Clean, dry, & intact 07/21/2018 10:44 PM   Phlebitis Assessment 0 07/21/2018 10:44 PM   Infiltration Assessment 0 07/21/2018 10:44 PM   Dressing Status Clean, dry, & intact 07/21/2018 10:44 PM   Dressing Type Transparent 07/21/2018 10:44 PM   Hub Color/Line Status Pink 07/21/2018 10:44 PM   Action Taken Blood drawn 07/21/2018 10:44 PM   Alcohol Cap Used No 07/21/2018 10:44 PM        Opportunity for questions and clarification was provided.      Patient transported with:   [employer]

## 2018-07-22 NOTE — ED Notes (Signed)
Pt still aware urine needed. Pt in low bed, no distress noted.

## 2018-07-22 NOTE — ED Notes (Signed)
Stool specimen sent to lab. Pt in low bed, no distress noted.

## 2018-07-22 NOTE — ED Notes (Signed)
Pt in low bed, no distress noted.

## 2018-07-22 NOTE — ED Notes (Signed)
Pt aware stool sable needed. Pt in low bed.

## 2018-07-22 NOTE — Progress Notes (Signed)
Medical Progress Note      NAME: Travis [name]   DOB:  06-Apr-1990  MRN:             [id number]    Date/Time: 07/22/2018  1:00 PM      Assessment:     1. AKI  2. Enteritis  3. Abdominal pain  4. HTN,   5. Diarrhea   6. Asthma   7. Hx of Bell's palsy     Plan:     ?? Continue [name] antibiotics with Zosyn  ?? Continue [name] fluids with normal saline  ?? Monitor renal function  ?? Monitor electrolytes and replete as needed  ?? Discussed with his mom and requesting patient to be tested for COVID-19 as he has contacts and get together with many people.  He is now also requested to be tested for HIV.  I have asked the patient if he is going to be consented.  Patient states that he had HIV test on April 26 which was negative per his report and he is not interested to be tested now.    Subjective:     Denies fever or chills, he had nausea and vomiting, has abdominal pain    Objective:     Vitals:    Last 24hrs VS reviewed since prior progress note. Most recent are:  Visit Vitals  BP (!) 143/95 (BP 1 Location: Left arm, BP Patient Position: Supine)   Pulse 80   Temp 97.9 ??F (36.6 ??C)   Resp 18   Ht 5' 9"  (1.753 m)   Wt 70.4 kg (155 lb 1.6 oz)   SpO2 100%   BMI 22.90 kg/m??     SpO2 Readings from Last 6 Encounters:   07/22/18 100%   04/10/18 99%   02/14/16 97%   01/27/16 99%   09/18/15 98%   06/05/15 99%            Intake/Output Summary (Last 24 hours) at 07/22/2018 1300  Last data filed at 07/22/2018 0230  Gross per 24 hour   Intake 2050 ml   Output ???   Net 2050 ml        Exam:     General:   Not in acute distress  HEENT: PERRLA, Neck Supple,  No JVD  Respiratory:   CTA bilaterally-no wheezes, rales, rhonchi, or crackles  Cardiac:  Regular Rate and Rythmn  - no murmurs, rubs or gallops  Abdominal:  Soft, non-tender, non-distended, positive bowel sounds  Extremities:  No cyanosis, or edema.  Skin: No rash  Neurological:  No focal neurological deficits    Medication:   Current Medications Reviewed     Current Facility-Administered Medications:   ???  ondansetron (ZOFRAN) injection 4 mg, 4 mg, IntraVENous, Q6H PRN, Dawson Bills, Muhamed, MD, 4 mg at 07/22/18 1132  ???  sodium chloride (NS) flush 5-10 mL, 5-10 mL, IntraVENous, Q8H, Jasarevic, Muhamed, MD  ???  sodium chloride (NS) flush 5-10 mL, 5-10 mL, IntraVENous, PRN, Rosalene Billings, MD  ???  naloxone (NARCAN) injection 0.1 mg, 0.1 mg, IntraVENous, PRN, Jasarevic, Muhamed, MD  ???  acetaminophen (TYLENOL) tablet 650 mg, 650 mg, Oral, Q6H PRN, Jasarevic, Muhamed, MD, 650 mg at 07/22/18 0850  ???  albuterol (PROVENTIL VENTOLIN) nebulizer solution 2.5 mg, 2.5 mg, Nebulization, Q4H PRN, Jasarevic, Muhamed, MD  ???  0.9% sodium chloride infusion, 125 mL/hr, IntraVENous, CONTINUOUS, Jasarevic, Muhamed, MD, Last Rate: 125 mL/hr at 07/22/18 0545, 125 mL/hr at 07/22/18 0545      Lab:  Lab Data Reviewed: (see below)  Recent Results (from the past 24 hour(s))   CBC WITH AUTOMATED DIFF    Collection Time: 07/21/18 10:42 PM   Result Value Ref Range    WBC 5.2 4.0 - 11.0 1000/mm3    RBC 6.22 (H) 3.80 - 5.70 M/uL    HGB 17.8 (H) 12.4 - 17.2 gm/dl    HCT 52.2 (H) 37.0 - 50.0 %    MCV 83.9 80.0 - 98.0 fL    MCH 28.6 23.0 - 34.6 pg    MCHC 34.1 30.0 - 36.0 gm/dl    PLATELET 183 140 - 450 1000/mm3    MPV 13.6 (H) 6.0 - 10.0 fL    RDW-SD 37.8 35.1 - 43.9      NRBC 0 0 - 0      IMMATURE GRANULOCYTES 0.4 0.0 - 3.0 %    NEUTROPHILS 64.6 (H) 34 - 64 %    LYMPHOCYTES 21.5 (L) 28 - 48 %    MONOCYTES 12.9 1 - 13 %    EOSINOPHILS 0.4 0 - 5 %    BASOPHILS 0.2 0 - 3 %   METABOLIC PANEL, COMPREHENSIVE    Collection Time: 07/21/18 10:42 PM   Result Value Ref Range    Sodium 135 (L) 136 - 145 mEq/L    Potassium 3.6 3.5 - 5.1 mEq/L    Chloride 107 98 - 107 mEq/L    CO2 13 (LL) 21 - 32 mEq/L    Glucose 123 (H) 74 - 106 mg/dl    BUN 38 (H) 7 - 25 mg/dl    Creatinine 4.0 (H) 0.6 - 1.3 mg/dl    GFR est AA 23.0      GFR est non-AA 19      Calcium 9.2 8.5 - 10.1 mg/dl    AST (SGOT) 18 15 - 37 U/L     ALT (SGPT) 23 12 - 78 U/L    Alk. phosphatase 87 45 - 117 U/L    Bilirubin, total 1.1 (H) 0.2 - 1.0 mg/dl    Protein, total 11.3 (H) 6.4 - 8.2 gm/dl    Albumin 4.8 3.4 - 5.0 gm/dl    Anion gap 16 (H) 5 - 15 mmol/L   LIPASE    Collection Time: 07/21/18 10:42 PM   Result Value Ref Range    Lipase 67 (L) 73 - 393 U/L   MAGNESIUM    Collection Time: 07/21/18 10:42 PM   Result Value Ref Range    Magnesium 2.4 1.6 - 2.6 mg/dl   URINALYSIS W/ RFLX MICROSCOPIC    Collection Time: 07/22/18  3:38 AM   Result Value Ref Range    Color YELLOW YELLOW,STRAW      Appearance HAZY (A) CLEAR      Glucose NEGATIVE NEGATIVE,Negative mg/dl    Bilirubin NEGATIVE NEGATIVE,Negative      Ketone NEGATIVE NEGATIVE,Negative mg/dl    Specific gravity 1.025 1.005 - 1.030      Blood TRACE (A) NEGATIVE,Negative      pH (UA) 6.0 5.0 - 9.0      Protein 30 (A) NEGATIVE,Negative mg/dl    Urobilinogen 0.2 0.0 - 1.0 mg/dl    Nitrites NEGATIVE NEGATIVE,Negative      Leukocyte Esterase NEGATIVE NEGATIVE,Negative     POC URINE MICROSCOPIC    Collection Time: 07/22/18  3:38 AM   Result Value Ref Range    WBC OCCASIONAL /HPF    RBC OCCASIONAL /HPF    Mucus PRESENT  Bacteria OCCASIONAL /HPF    Hyaline cast >50 /LPF    Fine Granular casts OCCASIONAL /LPF    CRYSTALS, CALCIUM OXALATE OCCASIONAL /HPF   POC URINE MACROSCOPIC    Collection Time: 07/22/18  3:52 AM   Result Value Ref Range    Glucose Negative NEGATIVE,Negative mg/dl    Bilirubin Negative NEGATIVE,Negative      Ketone Negative NEGATIVE,Negative mg/dl    Specific gravity >=1.030 1.005 - 1.030      Blood Trace-lysed (A) NEGATIVE,Negative      pH (UA) 6.0 5 - 9      Protein 100 (A) NEGATIVE,Negative mg/dl    Urobilinogen 0.2 0.0 - 1.0 EU/dl    Nitrites Negative NEGATIVE,Negative      Leukocyte Esterase Negative NEGATIVE,Negative      Color Yellow      Appearance Cloudy     C. DIFFICILE/EPI PCR    Collection Time: 07/22/18  4:07 AM   Result Value Ref Range     C. diff toxin by PCR Toxigenic C. difficile NEGATIVE Toxigenic C. difficile NEGATIVE         Ct Abd Pelv Wo Cont    Result Date: 07/22/2018  Abdomen and Pelvis CT scan. Without intravenous contrast. Comparison:  None Indication: AKI, diarrhea, vomiting.  Initial quality [facility] interpretation. Technique: 5 mm axial images without  Isovue 370 nonionic intravenous contrast. Oral contrast not given. Findings: Visualized lungs: Clear Hepatobiliary: Normal Stomach: Normal Pancreas: Normal Spleen: Normal Adrenal glands: Normal Kidneys/ureters: Normal Peritoneum/retroperitoneum:  Normal Lymph nodes: Normal Bowel: Fluid-filled colonic loops. Mild wall thickening of small bowel. Findings consistent with enteritis versus mild enterocolitis. No obstruction. Normal appendix.Marland Kitchen  Pelvic organs/Bladder: Normal Bones and soft tissues: Normal Other: None     Impression: 1. Findings consistent with enteritis versus mild enterocolitis. 2. Normal appendix DICOM format image data is available to non-affiliated external healthcare facilities or entities on a secure, media free, reciprocally searchable basis with patient authorization for 12 months following the date of the study.       Active Problems:    AKI (acute kidney injury) ([place]) (07/22/2018)      ____________________________________________________________________      Attending Physician: Stanton Kidney, MD

## 2018-07-22 NOTE — ED Notes (Signed)
Report given to Beka,RN.

## 2018-07-22 NOTE — ED Notes (Signed)
Pt's mother asking about care, pt states he is giving this RN permission to speak to mother,Grindle Brooks (mother). Mother updated on care. Pt in low bed and on monitors. Pt aware urine needed.

## 2018-07-22 NOTE — Other (Signed)
Salaar Hellyer new pt in 4224: vomiting, can we make him NPO? Also, mother at bedside wants a Covid test done on him. States he has lost about 20 lbs in a month. Tora Duck, RN [id number]</SPAN>

## 2018-07-22 NOTE — Progress Notes (Signed)
Problem: Falls - Risk of  Goal: *Absence of Falls  Description: Document Schmid Fall Risk and appropriate interventions in the flowsheet.  Outcome: Progressing Towards Goal  Note: Fall Risk Interventions:            Medication Interventions: Patient to call before getting OOB, Bed/chair exit alarm                   Problem: Patient Education: Go to Patient Education Activity  Goal: Patient/Family Education  Outcome: Progressing Towards Goal

## 2018-07-22 NOTE — H&P (Signed)
Medicine History and Physical    Patient: Travis [name] Age: 28 y.o. Sex: male    Date of Birth: 05-28-1990 Admit Date: 07/21/2018 PCP: None   MRN: [id number]  CSN: [id number]         Assessment   Acute Renal insufficiency, cr 4.0, baseline 0.9  HTN, elevated   Nausea and Vomiting  Hyperglycemia    Diarrhea   Asthma   Hx of Bell's palsy     Plan   IVF   Zosyn  Follow stool CS, Cdiff   Contact isolation  Trend Cr, avoid nephrotoxic medications   UA   Zofran PRN  Pain management   Diet NPO  DVT PPX SCD  ACP: CODE STATUS FULL CODE       Chief Complaint:  Chief Complaint   Patient presents with   ??? Abdominal Pain   ??? Vomiting         HPI:   Travis [name] is a 28 y.o. year old male who presents with    diffuse abd pain with n/v for the last 24 hours.  Pt last ate soup at 2000, but was unable to keep it down. Patient has had 5-10 episodes of non-bloody diarrhea. No sick contacts, no fever. He is  complaining of being thirsty. Noted to have Cr of 4.0, baseline normal. CT abdomen and pelvis pending.       Review of Systems - 12 Point ROS -ve except what is noted in the HPI.     Past Medical History:  Past Medical History:   Diagnosis Date   ??? Asthma    ??? Hypertension    ??? Neurological disorder     Bell's palsy       Past Surgical History:  History reviewed. No pertinent surgical history.    Family History:  History reviewed. No pertinent family history.    Social History:  Social History     Socioeconomic History   ??? Marital status: SINGLE     Spouse name: Not on file   ??? Number of children: Not on file   ??? Years of education: Not on file   ??? Highest education level: Not on file   Tobacco Use   ??? Smoking status: Never Smoker   ??? Smokeless tobacco: Never Used   Substance and Sexual Activity   ??? Alcohol use: No   ??? Drug use: No   ??? Sexual activity: Yes     Partners: Female     Birth control/protection: Condom       Home Medications:  Prior to Admission medications     Medication Sig Start Date End Date Taking? Authorizing Provider   albuterol (PROAIR HFA) 90 mcg/actuation inhaler Take 1 Puff by inhalation every four (4) hours as needed for Wheezing. 01/27/16   Ward, Shirley Friar, NP       Allergies:  No Known Allergies      Physical Exam:     Visit Vitals  BP 130/90   Pulse (!) 109   Temp 98.1 ??F (36.7 ??C)   Resp 15   Ht 5' 9"  (1.753 m)   Wt 70.4 kg (155 lb 1.6 oz)   SpO2 99%   BMI 22.90 kg/m??       Physical Exam:  General appearance: alert, cooperative, no distress, appears stated age  Head: Normocephalic, without obvious abnormality, atraumatic  Neck: supple, trachea midline  Lungs: clear to auscultation bilaterally  Heart: regular rate and rhythm, S1, S2 normal, no murmur,  click, rub or gallop  Abdomen: soft, non-tender. Bowel sounds normal. No masses,  no organomegaly  Extremities: extremities normal, atraumatic, no cyanosis or edema  Skin: dry skin, dry MM  Neurologic: Grossly normal        Intake and Output:  Current Shift:  No intake/output data recorded.  Last three shifts:  No intake/output data recorded.    Lab/Data Reviewed:  Lab:   Recent Results (from the past 12 hour(s))   CBC WITH AUTOMATED DIFF    Collection Time: 07/21/18 10:42 PM   Result Value Ref Range    WBC 5.2 4.0 - 11.0 1000/mm3    RBC 6.22 (H) 3.80 - 5.70 M/uL    HGB 17.8 (H) 12.4 - 17.2 gm/dl    HCT 52.2 (H) 37.0 - 50.0 %    MCV 83.9 80.0 - 98.0 fL    MCH 28.6 23.0 - 34.6 pg    MCHC 34.1 30.0 - 36.0 gm/dl    PLATELET 183 140 - 450 1000/mm3    MPV 13.6 (H) 6.0 - 10.0 fL    RDW-SD 37.8 35.1 - 43.9      NRBC 0 0 - 0      IMMATURE GRANULOCYTES 0.4 0.0 - 3.0 %    NEUTROPHILS 64.6 (H) 34 - 64 %    LYMPHOCYTES 21.5 (L) 28 - 48 %    MONOCYTES 12.9 1 - 13 %    EOSINOPHILS 0.4 0 - 5 %    BASOPHILS 0.2 0 - 3 %   METABOLIC PANEL, COMPREHENSIVE    Collection Time: 07/21/18 10:42 PM   Result Value Ref Range    Sodium 135 (L) 136 - 145 mEq/L    Potassium 3.6 3.5 - 5.1 mEq/L    Chloride 107 98 - 107 mEq/L     CO2 13 (LL) 21 - 32 mEq/L    Glucose 123 (H) 74 - 106 mg/dl    BUN 38 (H) 7 - 25 mg/dl    Creatinine 4.0 (H) 0.6 - 1.3 mg/dl    GFR est AA 23.0      GFR est non-AA 19      Calcium 9.2 8.5 - 10.1 mg/dl    AST (SGOT) 18 15 - 37 U/L    ALT (SGPT) 23 12 - 78 U/L    Alk. phosphatase 87 45 - 117 U/L    Bilirubin, total 1.1 (H) 0.2 - 1.0 mg/dl    Protein, total 11.3 (H) 6.4 - 8.2 gm/dl    Albumin 4.8 3.4 - 5.0 gm/dl    Anion gap 16 (H) 5 - 15 mmol/L   LIPASE    Collection Time: 07/21/18 10:42 PM   Result Value Ref Range    Lipase 67 (L) 73 - 393 U/L       Imaging:    No results found.    Rosalene Billings, MD  Jul 22, 2018

## 2018-07-22 NOTE — Progress Notes (Signed)
Patient admitted on 07/21/2018 from home with AKI    Chief Complaint   Patient presents with   ??? Abdominal Pain   ??? Vomiting          Tentative dc plan: Home with family assistance. Will need Indigent Meds       Treatment Team: Treatment Team: Attending Provider: Irving Burton, MD; Consulting Provider: Irving Burton, MD; Hospitalist: Theodis Aguas, MD      The patient has been admitted to the hospital 0 times in the past 12 months.    Previous 4 Admission Dates Admission and Discharge Diagnosis Interventions Barriers Disposition                                 Caregivers Participating in Plan of Care/Discharge Plan with the patient: Mother Rhoderick Moody       Anticipated DME needs for discharge: None      Does the patient have appropriate clothing available to be worn at discharge?  Yes      The patient and care participants are willing to travel N/A  area for discharge facility.  The patient and plan of care participants have been provided with a list of all available Rehab Facilities or Home Health agencies as applicable. CM will follow up with a list of facilities or agencies that are offer acceptance.    CM has disclosed any financial interest that [facility] may have with any facility or agency.    Anticipated Discharge Date: 2-3 days     The plan of care and discharge plan has been discussed with Irving Burton, MD and all other appropriate providers and adjusted per interdisciplinary team recommendations and in discussion with the patient and the patient designated Care Plan Participants.    Barriers to Healthcare Success/ Readmission Risk Factors: No    Consults:  Palliative Care Consult Recommended: No  Transitional Care Clinic Referral:  Yes  Transitional Nurse Navigator Referral: No  Oncology Navigator Referral: No  SW consulted: No  [facility] Consulted: Yes  Outside Hospital/Community Resources Referrals and Collaboration: No    Food/Nutrition Needs:   No       Dietician Consulted: No    RRAT Score: Low Risk            4       Total Score        4 Pt. Coverage (Medicare=5 , Medicaid, or Self-Pay=4)        Criteria that do not apply:    Has Seen PCP in Last 6 Months (Yes=3, No=0)    Married. Living with Significant Other. Assisted Living. LTAC. SNF. or   Rehab    Patient Length of Stay (>5 days = 3)    IP Visits Last 12 Months (1-3=4, 4=9, >4=11)    Charlson Comorbidity Score (Age + Comorbid Conditions)           PCP: None .   How do you get to your doctor appointments? Drive self     Specialists:     [facility]   Are there any medications that you have trouble paying for? Yes No insurance   Any difficulty getting your medications? Yes no Insurance     DME available at Home:None      Home O2 Provider: No  Home O2 L Flow:  No    Dialysis UnitN/A     Home Environment and Prior Level of  Function: Lives at [address]  [place] [address] @HOMEPHONE @.     Lives with mother in a 2 story home with 3 steps    Responsibilities at home include ADLS       Prior to admission open services: No    Home Health Agency-  Personal Care Agency-    Extended Emergency Contact Information  Primary Emergency Contact: [facility] [place]  Work Phone: [phone]  Mobile Phone: [phone]  Relation: Mother     Transportation: Mother  will transport home    Therapy Recommendations:    OT = No    PT = No    SLP =  No     RT Home O2 Evaluation =  No    Wound Care =  No    Case Management Assessment    ABUSE/NEGLECT SCREENING   Physical Abuse/Neglect: Denies   Sexual Abuse: Denies   Sexual Abuse: Denies   Other Abuse/Issues: Denies          PRIMARY DECISION MAKER    SELF                                CARE MANAGEMENT INTERVENTIONS   Readmission Interview Completed: Not Applicable   PCP Verified by CM: No(Tcc referral )           Mode of Transport at Discharge: BLS       Transition of Care Consult (CM Consult): Discharge Planning           MyChart Signup: No    Discharge Durable Medical Equipment: No   Physical Therapy Consult: No   Occupational Therapy Consult: No   Speech Therapy Consult: No   Current Support Network: Relative's Home   Reason for Referral: DCP Rounds   History Provided By: Patient, Medical Record   Patient Orientation: Alert and Oriented, Person, Place, Situation, Self   Cognition: Alert   Support System Response: Cooperative   Previous Living Arrangement: Lives with Family Independent   Home Accessibility: Steps, Multi Level Home(3 steps )   Prior Functional Level: Independent in ADLs/IADLs   Current Functional Level: Independent in ADLs/IADLs       Can patient return to prior living arrangement: Yes   Ability to make needs known:: Good   Family able to assist with home care needs:: Yes   Pets: Dog(1 dog )   Needs help with pets while hospitalized: No       Types of Needs Identified: Disease Management Education, Treatment Education   Anticipated Discharge Needs: TCC   Confirm Follow Up Transport: Family   Confirm Transport and Arrange: Yes              DISCHARGE LOCATION   Discharge Placement: Home with family assistance

## 2018-07-22 NOTE — ED Provider Notes (Signed)
ED Provider Notes by Fredda Hammed, PA-C at 07/22/18 0008                Author: Fredda Hammed, PA-C  Service: Emergency Medicine  Author Type: Physician Assistant       Filed: 07/22/18 0217  Date of Service: 07/22/18 0008  Status: Attested           Editor: Bitzer, Bonnee Quin (Physician Assistant)  Cosigner: Mora Bellman, MD at 07/22/18 0301          Attestation signed by Mora Bellman, MD at 07/22/18 0301          Addendum by Dr. Jacklynn Barnacle:  I have personally seen and examined this patient; I have fully participated in the care of this patient with the advanced practice  provider.  I have reviewed and agree with all pertinent clinical information including history, physical exam, labs, radiographic studies and the plan.                                 [place]   Emergency Department Treatment Report                Patient: Travis [name]  Age: 28 y.o.  Sex: male          Date of Birth: 08-Feb-1991  Admit Date: 07/21/2018  PCP: None     MRN: [id number]   CSN: [id number]   Attending: Mora Bellman, MD         Room: ER02/ER02  Time Dictated: 12:08 AM  APP: Fredda Hammed, PA-C        Chief Complaint      Chief Complaint       Patient presents with        ?  Abdominal Pain        ?  Vomiting                  History of Present Illness     28 y.o. male  who presents for evaluation of abdominal pain, nausea, vomiting and diarrhea that patient states started about 24 hours ago.  Patient reports abdominal cramping that was initially constant however resolved this evening so he ate some soup and then vomited  again and the abdominal pain returned.  He denies aggravating or alleviating factors.  He reports several episodes of nonbloody emesis and reports 5-10 episodes of nonbloody diarrhea since yesterday.  He reports the day before the symptoms started, he  had a poke bowl and is unsure if this is related. he denies fevers or chills, irritative voiding symptoms, known  sick contacts.  He denies prior abdominal surgeries.  He reports he is exceptionally thirsty at this time but denies polydipsia prior to today  and denies polyuria.  He reports prior to 24 hours ago he had been feeling well with no complaints.  He denies cough, chest pain, shortness of breath.  He reports occasional alcohol use. He has no other complaints at this time.        Review of Systems     Review of Systems    Constitutional: Negative for chills and fever.    HENT: Positive for congestion. Negative for sore throat.     Eyes: Negative for blurred vision and double vision.    Respiratory: Negative for cough and shortness of breath.     Cardiovascular: Negative for  chest pain and leg swelling.    Gastrointestinal: Positive for abdominal pain, diarrhea , nausea and vomiting.  Negative for blood in stool and melena.    Genitourinary: Negative for dysuria and frequency.    Musculoskeletal: Negative for myalgias and neck pain.    Skin: Negative for rash.    Neurological: Negative for dizziness and headaches.            Past Medical/Surgical History          Past Medical History:        Diagnosis  Date         ?  Asthma       ?  Hypertension       ?  Neurological disorder            Bell's palsy        History reviewed. No pertinent surgical history.        Social History          Social History          Socioeconomic History         ?  Marital status:  SINGLE              Spouse name:  Not on file         ?  Number of children:  Not on file     ?  Years of education:  Not on file     ?  Highest education level:  Not on file       Occupational History        ?  Not on file       Social Needs         ?  Financial resource strain:  Not on file        ?  Food insecurity              Worry:  Not on file         Inability:  Not on file        ?  Transportation needs              Medical:  Not on file         Non-medical:  Not on file       Tobacco Use         ?  Smoking status:  Never Smoker     ?  Smokeless tobacco:   Never Used       Substance and Sexual Activity         ?  Alcohol use:  No     ?  Drug use:  No     ?  Sexual activity:  Yes              Partners:  Female         Birth control/protection:  Condom       Lifestyle        ?  Physical activity              Days per week:  Not on file         Minutes per session:  Not on file         ?  Stress:  Not on file       Relationships        ?  Social Health visitor on phone:  Not  on file         Gets together:  Not on file         Attends religious service:  Not on file         Active member of club or organization:  Not on file         Attends meetings of clubs or organizations:  Not on file         Relationship status:  Not on file        ?  Intimate partner violence              Fear of current or ex partner:  Not on file         Emotionally abused:  Not on file         Physically abused:  Not on file         Forced sexual activity:  Not on file        Other Topics  Concern        ?  Not on file       Social History Narrative        ?  Not on file             Family History     History reviewed. No pertinent family history.        Current Medications     (Not in a hospital admission)           Allergies     No Known Allergies        Physical Exam          ED Triage Vitals [07/21/18 2230]     ED Encounter Vitals Group           BP  130/90        Pulse (Heart Rate)  (!) 115        Resp Rate  18        Temp  98.1 ??F (36.7 ??C)        Temp src          O2 Sat (%)  99 %        Weight  155 lb 1.6 oz           Height  5' 9"            Constitutional: Patient appears well developed and well nourished. Appears nontoxic, but uncomfortable appearing.   HENT: Normal cephalic and atraumatic.   Mucous membranes very dry, non-erythematous and without lesions. Uvula is midline.   Eyes: Conjunctivae are clear with no discharge.   Neck: Normal ROM and non tender. Supple. No nuchal rigidity.    Respiratory: Breath sounds are equal bilaterally. Lungs clear to auscultation with  nonlabored respirations. No tachypnea or accessory muscle  use.   Cardiovascular: Tachycardic with normal rhythm.  Distal pulses intact and symmetric. Calves soft and non-tender.   Gastrointestinal: Bowel sounds normal. Abdomen soft and without complaint of pain to palpation. No distention. No masses palpable.  Pitting  edema of abdomen   Musculoskeletal: Moves all extremities well. No lower extremity edema.    Lymphatic: No cervical adenopathy.   Integumentary: Warm and dry without rashes or erythema.   Neurologic: Alert and oriented. No facial asymmetry or dysarthria.   Psychological: Behavior is age and situation appropriate. No difficulty with memory.          Impression and Management Plan     This is a 28 y.o.  male with nausea, vomiting, diarrhea and abdominal pain that has been ongoing for the past 24 hours.  Patient is afebrile, normotensive and tachycardic on arrival.  On exam, patient is nontoxic-appearing  but appears uncomfortable.  Mucous membranes are very dry.  Abdomen is soft and without tenderness.  There is pitting edema of the abdominal skin however no lower extremity edema.  Lungs are clear to auscultation bilaterally. Will obtain appropriate studies  to evaluate the patient's complaints and treat symptomatically.         Diagnostic Studies     Lab:      Recent Results (from the past 24 hour(s))     CBC WITH AUTOMATED DIFF          Collection Time: 07/21/18 10:42 PM         Result  Value  Ref Range            WBC  5.2  4.0 - 11.0 1000/mm3       RBC  6.22 (H)  3.80 - 5.70 M/uL       HGB  17.8 (H)  12.4 - 17.2 gm/dl       HCT  52.2 (H)  37.0 - 50.0 %       MCV  83.9  80.0 - 98.0 fL       MCH  28.6  23.0 - 34.6 pg       MCHC  34.1  30.0 - 36.0 gm/dl       PLATELET  183  140 - 450 1000/mm3       MPV  13.6 (H)  6.0 - 10.0 fL       RDW-SD  37.8  35.1 - 43.9         NRBC  0  0 - 0         IMMATURE GRANULOCYTES  0.4  0.0 - 3.0 %       NEUTROPHILS  64.6 (H)  34 - 64 %       LYMPHOCYTES  21.5 (L)  28 - 48  %       MONOCYTES  12.9  1 - 13 %       EOSINOPHILS  0.4  0 - 5 %       BASOPHILS  0.2  0 - 3 %       METABOLIC PANEL, COMPREHENSIVE          Collection Time: 07/21/18 10:42 PM         Result  Value  Ref Range            Sodium  135 (L)  136 - 145 mEq/L       Potassium  3.6  3.5 - 5.1 mEq/L       Chloride  107  98 - 107 mEq/L       CO2  13 (LL)  21 - 32 mEq/L       Glucose  123 (H)  74 - 106 mg/dl       BUN  38 (H)  7 - 25 mg/dl       Creatinine  4.0 (H)  0.6 - 1.3 mg/dl       GFR est AA  23.0          GFR est non-AA  19               Calcium  9.2  8.5 - 10.1 mg/dl  AST (SGOT)  18  15 - 37 U/L       ALT (SGPT)  23  12 - 78 U/L       Alk. phosphatase  87  45 - 117 U/L       Bilirubin, total  1.1 (H)  0.2 - 1.0 mg/dl       Protein, total  11.3 (H)  6.4 - 8.2 gm/dl       Albumin  4.8  3.4 - 5.0 gm/dl       Anion gap  16 (H)  5 - 15 mmol/L       LIPASE          Collection Time: 07/21/18 10:42 PM         Result  Value  Ref Range            Lipase  67 (L)  73 - 393 U/L       MAGNESIUM          Collection Time: 07/21/18 10:42 PM         Result  Value  Ref Range            Magnesium  2.4  1.6 - 2.6 mg/dl          Labs Reviewed       CBC WITH AUTOMATED DIFF - Abnormal; Notable for the following components:            Result  Value            RBC  6.22 (*)         HGB  17.8 (*)         HCT  52.2 (*)         MPV  13.6 (*)         NEUTROPHILS  64.6 (*)         LYMPHOCYTES  21.5 (*)            All other components within normal limits       METABOLIC PANEL, COMPREHENSIVE - Abnormal; Notable for the following components:            Sodium  135 (*)         CO2  13 (*)         Glucose  123 (*)         BUN  38 (*)         Creatinine  4.0 (*)         Bilirubin, total  1.1 (*)         Protein, total  11.3 (*)         Anion gap  16 (*)            All other components within normal limits       LIPASE - Abnormal; Notable for the following components:            Lipase  67 (*)            All other components within normal  limits       C. DIFFICILE/EPI PCR       MAGNESIUM           Imaging:     No results found.      CT Abdomen/Pelvis     Comparison:   No comparison study available.     Findings:     The appendix  is visualized and appears normal without inflammation or enlargement.  Findings demonstrate fluid-filled colonic loops . Mild wall thickening of the small bowel loops and sigmoid colon are noted. Findings are consistent with an enteritis with secondary  diarrhea versus a mild enterocolitis.   No bowel obstruction.   There are normal appearing stomach, liver, gallbladder, pancreas and bilateral adrenal glands.   Normal appearing spleen is noted.   Normal kidneys appearing are noted.    Normal appearing bladder is noted.   Unremarkable aorta is noted.     Visualized lungs are grossly normal.   No aggressive bone lesion.    Impression  Findings are consistent with an enteritis with secondary diarrhea versus a  mild enterocolitis.        ED Course/Medical Decision Making     Labs show normal white blood cell count.  Chemistry shows evidence of acute renal failure with creatinine of 4.0 with BUN of 38.  Potassium is 3.6.  CO2 is 13 with anion gap of 16.   The remainder of his labs are unremarkable.      CT shows findings consistent with enteritis.  No evidence of renal abnormalities or obstruction.      Consult was placed for admission and was accepted by Dr. Dawson Bills who requests to start patient on antibiotics for possible infectious diarrhea.  C. difficile is pending at this time.      Patient has not developed any new or worsening symptoms and has remained clinically stable throughout his course in the ED.        Medications       sodium chloride (NS) flush 5-10 mL (has no administration in time range)     sodium chloride 0.9 % bolus infusion 1,000 mL (0 mL IntraVENous [name] Completed 07/22/18 0030)     ondansetron (ZOFRAN) injection 4 mg (4 mg IntraVENous Given 07/21/18 2339)     dicyclomine (BENTYL) 10 mg/mL injection 20 mg  (20 mg IntraMUSCular Given 07/21/18 2339)     sodium chloride 0.9 % bolus infusion 1,000 mL (1,000 mL IntraVENous New Bag 07/22/18 0104)       piperacillin-tazobactam (ZOSYN) 3.375 g in 0.9% sodium chloride (MBP/ADV) 50 mL MBP (3.375 g IntraVENous New Bag 07/22/18 0103)             Final Diagnosis                 ICD-10-CM  ICD-9-CM          1.  AKI (acute kidney injury) ([place])  N17.9  584.9          2.  Enteritis  K52.9  558.9             Disposition     Admitted in stable condition.        Current Discharge Medication List                  Patient has been evaluated by myself and ROMASH, JONATHAN A, MD who agrees with the above assessment and plan.      Loralyn Freshwater Bitzer, PA-C   Jul 22, 2018   12:08 AM         My signature above authenticates this document and my orders, the final diagnosis(es), discharge prescription(s), and instructions in the Epic record. If you have any questions please contact [phone].       Nursing notes have been reviewed by the physician and advanced practice clinician.

## 2018-07-22 NOTE — H&P (Signed)
Medicine History and Physical    Patient: Travis [name] Age: 28 y.o. Sex: male    Date of Birth: 10-14-1990 Admit Date: 07/21/2018 PCP: None   MRN: [id number]  CSN: [id number]         Assessment   Acute Renal insufficiency, cr 4.0, baseline 0.9  HTN, elevated   Nausea and Vomiting  Hyperglycemia    Diarrhea   Asthma   Hx of Bell's palsy     Plan   IVF   Zosyn  Follow stool CS, Cdiff   Contact isolation  Trend Cr, avoid nephrotoxic medications   UA   Zofran PRN  Pain management   Diet NPO  DVT PPX SCD  ACP: CODE STATUS FULL CODE       Chief Complaint:  Chief Complaint   Patient presents with   ??? Abdominal Pain   ??? Vomiting         HPI:   Travis [name] is a 28 y.o. year old male who presents with    diffuse abd pain with n/v for the last 24 hours.  Pt last ate soup at 2000, but was unable to keep it down. Patient has had 5-10 episodes of non-bloody diarrhea. No sick contacts, no fever. He is  complaining of being thirsty. Noted to have Cr of 4.0, baseline normal. CT abdomen and pelvis pending.       Review of Systems - 12 Point ROS -ve except what is noted in the HPI.     Past Medical History:  Past Medical History:   Diagnosis Date   ??? Asthma    ??? Hypertension    ??? Neurological disorder     Bell's palsy       Past Surgical History:  History reviewed. No pertinent surgical history.    Family History:  History reviewed. No pertinent family history.    Social History:  Social History     Socioeconomic History   ??? Marital status: SINGLE     Spouse name: Not on file   ??? Number of children: Not on file   ??? Years of education: Not on file   ??? Highest education level: Not on file   Tobacco Use   ??? Smoking status: Never Smoker   ??? Smokeless tobacco: Never Used   Substance and Sexual Activity   ??? Alcohol use: No   ??? Drug use: No   ??? Sexual activity: Yes     Partners: Female     Birth control/protection: Condom       Home Medications:  Prior to Admission medications    Medication Sig Start Date End Date Taking? Authorizing  Provider   albuterol (PROAIR HFA) 90 mcg/actuation inhaler Take 1 Puff by inhalation every four (4) hours as needed for Wheezing. 01/27/16   Ward, Shirley Friar, NP       Allergies:  No Known Allergies      Physical Exam:     Visit Vitals  BP 130/90   Pulse (!) 109   Temp 98.1 ??F (36.7 ??C)   Resp 15   Ht 5' 9"  (1.753 m)   Wt 70.4 kg (155 lb 1.6 oz)   SpO2 99%   BMI 22.90 kg/m??       Physical Exam:  General appearance: alert, cooperative, no distress, appears stated age  Head: Normocephalic, without obvious abnormality, atraumatic  Neck: supple, trachea midline  Lungs: clear to auscultation bilaterally  Heart: regular rate and rhythm, S1, S2 normal, no murmur,  click, rub or gallop  Abdomen: soft, non-tender. Bowel sounds normal. No masses,  no organomegaly  Extremities: extremities normal, atraumatic, no cyanosis or edema  Skin: dry skin, dry MM  Neurologic: Grossly normal        Intake and Output:  Current Shift:  No intake/output data recorded.  Last three shifts:  No intake/output data recorded.    Lab/Data Reviewed:  Lab:   Recent Results (from the past 12 hour(s))   CBC WITH AUTOMATED DIFF    Collection Time: 07/21/18 10:42 PM   Result Value Ref Range    WBC 5.2 4.0 - 11.0 1000/mm3    RBC 6.22 (H) 3.80 - 5.70 M/uL    HGB 17.8 (H) 12.4 - 17.2 gm/dl    HCT 52.2 (H) 37.0 - 50.0 %    MCV 83.9 80.0 - 98.0 fL    MCH 28.6 23.0 - 34.6 pg    MCHC 34.1 30.0 - 36.0 gm/dl    PLATELET 183 140 - 450 1000/mm3    MPV 13.6 (H) 6.0 - 10.0 fL    RDW-SD 37.8 35.1 - 43.9      NRBC 0 0 - 0      IMMATURE GRANULOCYTES 0.4 0.0 - 3.0 %    NEUTROPHILS 64.6 (H) 34 - 64 %    LYMPHOCYTES 21.5 (L) 28 - 48 %    MONOCYTES 12.9 1 - 13 %    EOSINOPHILS 0.4 0 - 5 %    BASOPHILS 0.2 0 - 3 %   METABOLIC PANEL, COMPREHENSIVE    Collection Time: 07/21/18 10:42 PM   Result Value Ref Range    Sodium 135 (L) 136 - 145 mEq/L    Potassium 3.6 3.5 - 5.1 mEq/L    Chloride 107 98 - 107 mEq/L    CO2 13 (LL) 21 - 32 mEq/L    Glucose 123 (H) 74 - 106 mg/dl    BUN  38 (H) 7 - 25 mg/dl    Creatinine 4.0 (H) 0.6 - 1.3 mg/dl    GFR est AA 23.0      GFR est non-AA 19      Calcium 9.2 8.5 - 10.1 mg/dl    AST (SGOT) 18 15 - 37 U/L    ALT (SGPT) 23 12 - 78 U/L    Alk. phosphatase 87 45 - 117 U/L    Bilirubin, total 1.1 (H) 0.2 - 1.0 mg/dl    Protein, total 11.3 (H) 6.4 - 8.2 gm/dl    Albumin 4.8 3.4 - 5.0 gm/dl    Anion gap 16 (H) 5 - 15 mmol/L   LIPASE    Collection Time: 07/21/18 10:42 PM   Result Value Ref Range    Lipase 67 (L) 73 - 393 U/L       Imaging:    No results found.    Rosalene Billings, MD  Jul 22, 2018

## 2018-07-22 NOTE — ED Notes (Signed)
Pt still aware urine needed. Pt in low bed, no distress noted.

## 2018-07-22 NOTE — ED Notes (Signed)
Pt in low bed, no distress noted.

## 2018-07-22 NOTE — Progress Notes (Signed)
Medical Progress Note      NAME: Travis [name]   DOB:  Mar 21, 1990  MRN:             [id number]    Date/Time: 07/22/2018  1:00 PM      Assessment:     1. AKI  2. Enteritis  3. Abdominal pain  4. HTN,   5. Diarrhea   6. Asthma   7. Hx of Bell's palsy     Plan:     ?? Continue [name] antibiotics with Zosyn  ?? Continue [name] fluids with normal saline  ?? Monitor renal function  ?? Monitor electrolytes and replete as needed  ?? Discussed with his mom and requesting patient to be tested for COVID-19 as he has contacts and get together with many people.  He is now also requested to be tested for HIV.  I have asked the patient if he is going to be consented.  Patient states that he had HIV test on April 26 which was negative per his report and he is not interested to be tested now.    Subjective:     Denies fever or chills, he had nausea and vomiting, has abdominal pain    Objective:     Vitals:    Last 24hrs VS reviewed since prior progress note. Most recent are:  Visit Vitals  BP (!) 143/95 (BP 1 Location: Left arm, BP Patient Position: Supine)   Pulse 80   Temp 97.9 ??F (36.6 ??C)   Resp 18   Ht 5' 9"  (1.753 m)   Wt 70.4 kg (155 lb 1.6 oz)   SpO2 100%   BMI 22.90 kg/m??     SpO2 Readings from Last 6 Encounters:   07/22/18 100%   04/10/18 99%   02/14/16 97%   01/27/16 99%   09/18/15 98%   06/05/15 99%            Intake/Output Summary (Last 24 hours) at 07/22/2018 1300  Last data filed at 07/22/2018 0230  Gross per 24 hour   Intake 2050 ml   Output ???   Net 2050 ml        Exam:     General:   Not in acute distress  HEENT: PERRLA, Neck Supple,  No JVD  Respiratory:   CTA bilaterally-no wheezes, rales, rhonchi, or crackles  Cardiac:  Regular Rate and Rythmn  - no murmurs, rubs or gallops  Abdominal:  Soft, non-tender, non-distended, positive bowel sounds  Extremities:  No cyanosis, or edema.  Skin: No rash  Neurological:  No focal neurological deficits    Medication:   Current Medications Reviewed    Current  Facility-Administered Medications:   ???  ondansetron (ZOFRAN) injection 4 mg, 4 mg, IntraVENous, Q6H PRN, Dawson Bills, Muhamed, MD, 4 mg at 07/22/18 1132  ???  sodium chloride (NS) flush 5-10 mL, 5-10 mL, IntraVENous, Q8H, Jasarevic, Muhamed, MD  ???  sodium chloride (NS) flush 5-10 mL, 5-10 mL, IntraVENous, PRN, Rosalene Billings, MD  ???  naloxone (NARCAN) injection 0.1 mg, 0.1 mg, IntraVENous, PRN, Jasarevic, Muhamed, MD  ???  acetaminophen (TYLENOL) tablet 650 mg, 650 mg, Oral, Q6H PRN, Jasarevic, Muhamed, MD, 650 mg at 07/22/18 0850  ???  albuterol (PROVENTIL VENTOLIN) nebulizer solution 2.5 mg, 2.5 mg, Nebulization, Q4H PRN, Jasarevic, Muhamed, MD  ???  0.9% sodium chloride infusion, 125 mL/hr, IntraVENous, CONTINUOUS, Jasarevic, Muhamed, MD, Last Rate: 125 mL/hr at 07/22/18 0545, 125 mL/hr at 07/22/18 0545      Lab:  Lab Data Reviewed: (see below)  Recent Results (from the past 24 hour(s))   CBC WITH AUTOMATED DIFF    Collection Time: 07/21/18 10:42 PM   Result Value Ref Range    WBC 5.2 4.0 - 11.0 1000/mm3    RBC 6.22 (H) 3.80 - 5.70 M/uL    HGB 17.8 (H) 12.4 - 17.2 gm/dl    HCT 52.2 (H) 37.0 - 50.0 %    MCV 83.9 80.0 - 98.0 fL    MCH 28.6 23.0 - 34.6 pg    MCHC 34.1 30.0 - 36.0 gm/dl    PLATELET 183 140 - 450 1000/mm3    MPV 13.6 (H) 6.0 - 10.0 fL    RDW-SD 37.8 35.1 - 43.9      NRBC 0 0 - 0      IMMATURE GRANULOCYTES 0.4 0.0 - 3.0 %    NEUTROPHILS 64.6 (H) 34 - 64 %    LYMPHOCYTES 21.5 (L) 28 - 48 %    MONOCYTES 12.9 1 - 13 %    EOSINOPHILS 0.4 0 - 5 %    BASOPHILS 0.2 0 - 3 %   METABOLIC PANEL, COMPREHENSIVE    Collection Time: 07/21/18 10:42 PM   Result Value Ref Range    Sodium 135 (L) 136 - 145 mEq/L    Potassium 3.6 3.5 - 5.1 mEq/L    Chloride 107 98 - 107 mEq/L    CO2 13 (LL) 21 - 32 mEq/L    Glucose 123 (H) 74 - 106 mg/dl    BUN 38 (H) 7 - 25 mg/dl    Creatinine 4.0 (H) 0.6 - 1.3 mg/dl    GFR est AA 23.0      GFR est non-AA 19      Calcium 9.2 8.5 - 10.1 mg/dl    AST (SGOT) 18 15 - 37 U/L    ALT (SGPT) 23 12 -  78 U/L    Alk. phosphatase 87 45 - 117 U/L    Bilirubin, total 1.1 (H) 0.2 - 1.0 mg/dl    Protein, total 11.3 (H) 6.4 - 8.2 gm/dl    Albumin 4.8 3.4 - 5.0 gm/dl    Anion gap 16 (H) 5 - 15 mmol/L   LIPASE    Collection Time: 07/21/18 10:42 PM   Result Value Ref Range    Lipase 67 (L) 73 - 393 U/L   MAGNESIUM    Collection Time: 07/21/18 10:42 PM   Result Value Ref Range    Magnesium 2.4 1.6 - 2.6 mg/dl   URINALYSIS W/ RFLX MICROSCOPIC    Collection Time: 07/22/18  3:38 AM   Result Value Ref Range    Color YELLOW YELLOW,STRAW      Appearance HAZY (A) CLEAR      Glucose NEGATIVE NEGATIVE,Negative mg/dl    Bilirubin NEGATIVE NEGATIVE,Negative      Ketone NEGATIVE NEGATIVE,Negative mg/dl    Specific gravity 1.025 1.005 - 1.030      Blood TRACE (A) NEGATIVE,Negative      pH (UA) 6.0 5.0 - 9.0      Protein 30 (A) NEGATIVE,Negative mg/dl    Urobilinogen 0.2 0.0 - 1.0 mg/dl    Nitrites NEGATIVE NEGATIVE,Negative      Leukocyte Esterase NEGATIVE NEGATIVE,Negative     POC URINE MICROSCOPIC    Collection Time: 07/22/18  3:38 AM   Result Value Ref Range    WBC OCCASIONAL /HPF    RBC OCCASIONAL /HPF    Mucus PRESENT  Bacteria OCCASIONAL /HPF    Hyaline cast >50 /LPF    Fine Granular casts OCCASIONAL /LPF    CRYSTALS, CALCIUM OXALATE OCCASIONAL /HPF   POC URINE MACROSCOPIC    Collection Time: 07/22/18  3:52 AM   Result Value Ref Range    Glucose Negative NEGATIVE,Negative mg/dl    Bilirubin Negative NEGATIVE,Negative      Ketone Negative NEGATIVE,Negative mg/dl    Specific gravity >=1.030 1.005 - 1.030      Blood Trace-lysed (A) NEGATIVE,Negative      pH (UA) 6.0 5 - 9      Protein 100 (A) NEGATIVE,Negative mg/dl    Urobilinogen 0.2 0.0 - 1.0 EU/dl    Nitrites Negative NEGATIVE,Negative      Leukocyte Esterase Negative NEGATIVE,Negative      Color Yellow      Appearance Cloudy     C. DIFFICILE/EPI PCR    Collection Time: 07/22/18  4:07 AM   Result Value Ref Range    C. diff toxin by PCR Toxigenic C. difficile NEGATIVE  Toxigenic C. difficile NEGATIVE         Ct Abd Pelv Wo Cont    Result Date: 07/22/2018  Abdomen and Pelvis CT scan. Without intravenous contrast. Comparison:  None Indication: AKI, diarrhea, vomiting.  Initial quality [facility] interpretation. Technique: 5 mm axial images without  Isovue 370 nonionic intravenous contrast. Oral contrast not given. Findings: Visualized lungs: Clear Hepatobiliary: Normal Stomach: Normal Pancreas: Normal Spleen: Normal Adrenal glands: Normal Kidneys/ureters: Normal Peritoneum/retroperitoneum:  Normal Lymph nodes: Normal Bowel: Fluid-filled colonic loops. Mild wall thickening of small bowel. Findings consistent with enteritis versus mild enterocolitis. No obstruction. Normal appendix.Marland Kitchen  Pelvic organs/Bladder: Normal Bones and soft tissues: Normal Other: None     Impression: 1. Findings consistent with enteritis versus mild enterocolitis. 2. Normal appendix DICOM format image data is available to non-affiliated external healthcare facilities or entities on a secure, media free, reciprocally searchable basis with patient authorization for 12 months following the date of the study.       Active Problems:    AKI (acute kidney injury) ([place]) (07/22/2018)      ____________________________________________________________________      Attending Physician: Stanton Kidney, MD

## 2018-07-22 NOTE — ED Notes (Signed)
Stool specimen sent to lab. Pt in low bed, no distress noted.

## 2018-07-22 NOTE — ED Notes (Addendum)
ED Notes by Adele Schilder at 07/22/18 [phone]                Author: Adele Schilder  Service: EMERGENCY  Author Type: Registered Nurse       Filed: 07/22/18 0908  Date of Service: 07/22/18 0842  Status: Addendum          Editor: Adele Schilder (Registered Nurse)          Related Notes: Original Note by Adele Schilder (Registered Nurse) filed at 07/22/18  [phone]               0730   Bedside and Verbal shift change report given to Adele Schilder    (oncoming nurse) by Maralyn Sago, RN  (offgoing nurse). Report included the following information ED Summary.    [id number]   Patient alert.   Call light within reach.   Patient c/o pain.    Will medicate.    Aware of the plan of care.   White Board is updated and accurate.    Awaiting all results and disposition.    [id number]   Page Sent             PAGER ID: [phone]   MESSAGE: [id number]. [name]. Orders for patient to be NPO after midnight?  Patient requesting to eat/drink. Does patient have procedure? Lupe Carney, ER   [phone]   TRANSFER - OUT REPORT:      Verbal report given to Elnita Maxwell, RN (name) on Travis [name] IV  being transferred to 4224(unit) for  routine progression of care         Report consisted of patients Situation, Background, Assessment and    Recommendations(SBAR).       Information from the following report(s) ED Summary was reviewed with the receiving nurse.      Lines:      Peripheral IV 07/21/18 Left Antecubital (Active)         Site Assessment  Clean, dry, & intact  07/21/2018 10:44 PM     Phlebitis Assessment  0  07/21/2018 10:44 PM     Infiltration Assessment  0  07/21/2018 10:44 PM     Dressing Status  Clean, dry, & intact  07/21/2018 10:44 PM     Dressing Type  Transparent  07/21/2018 10:44 PM     Hub Color/Line Status  Pink  07/21/2018 10:44 PM     Action Taken  Blood drawn  07/21/2018 10:44 PM         Alcohol Cap Used  No  07/21/2018 10:44 PM            Opportunity for questions and clarification was provided.         Patient transported with:    [employer]

## 2018-07-22 NOTE — Progress Notes (Signed)
Patient admitted on 07/21/2018 from home with AKI    Chief Complaint   Patient presents with   . Abdominal Pain   . Vomiting          Tentative dc plan: Home with family assistance. Will need Indigent Meds       Treatment Team: Treatment Team: Attending Provider: Irving Burton, MD; Consulting Provider: Irving Burton, MD; Hospitalist: Theodis Aguas, MD      The patient has been admitted to the hospital 0 times in the past 12 months.    Previous 4 Admission Dates Admission and Discharge Diagnosis Interventions Barriers Disposition                                 Caregivers Participating in Plan of Care/Discharge Plan with the patient: Mother Rhoderick Moody       Anticipated DME needs for discharge: None      Does the patient have appropriate clothing available to be worn at discharge?  Yes      The patient and care participants are willing to travel N/A  area for discharge facility.  The patient and plan of care participants have been provided with a list of all available Rehab Facilities or Home Health agencies as applicable. CM will follow up with a list of facilities or agencies that are offer acceptance.    CM has disclosed any financial interest that [facility] may have with any facility or agency.    Anticipated Discharge Date: 2-3 days     The plan of care and discharge plan has been discussed with Irving Burton, MD and all other appropriate providers and adjusted per interdisciplinary team recommendations and in discussion with the patient and the patient designated Care Plan Participants.    Barriers to Healthcare Success/ Readmission Risk Factors: No    Consults:  Palliative Care Consult Recommended: No  Transitional Care Clinic Referral:  Yes  Transitional Nurse Navigator Referral: No  Oncology Navigator Referral: No  SW consulted: No  [facility] Consulted: Yes  Outside Hospital/Community Resources Referrals and Collaboration: No    Food/Nutrition Needs:   No       Dietician Consulted: No    RRAT Score: Low Risk            4       Total Score        4 Pt. Coverage (Medicare=5 , Medicaid, or Self-Pay=4)        Criteria that do not apply:    Has Seen PCP in Last 6 Months (Yes=3, No=0)    Married. Living with Significant Other. Assisted Living. LTAC. SNF. or   Rehab    Patient Length of Stay (>5 days = 3)    IP Visits Last 12 Months (1-3=4, 4=9, >4=11)    Charlson Comorbidity Score (Age + Comorbid Conditions)           PCP: None .   How do you get to your doctor appointments? Drive self     Specialists:     [facility]   Are there any medications that you have trouble paying for? Yes No insurance   Any difficulty getting your medications? Yes no Insurance     DME available at Home:None      Home O2 Provider: No  Home O2 L Flow:  No    Dialysis UnitN/A     Home Environment and Prior Level of  Function: Lives at [address]  [place] [address] @HOMEPHONE @.     Lives with mother in a 2 story home with 3 steps    Responsibilities at home include ADLS       Prior to admission open services: No    Home Health Agency-  Personal Care Agency-    Extended Emergency Contact Information  Primary Emergency Contact: [facility] [place]  Work Phone: [phone]  Mobile Phone: [phone]  Relation: Mother     Transportation: Mother  will transport home    Therapy Recommendations:    OT = No    PT = No    SLP =  No     RT Home O2 Evaluation =  No    Wound Care =  No    Case Management Assessment    ABUSE/NEGLECT SCREENING   Physical Abuse/Neglect: Denies   Sexual Abuse: Denies   Sexual Abuse: Denies   Other Abuse/Issues: Denies          PRIMARY DECISION MAKER    SELF                                CARE MANAGEMENT INTERVENTIONS   Readmission Interview Completed: Not Applicable   PCP Verified by CM: No(Tcc referral )           Mode of Transport at Discharge: BLS       Transition of Care Consult (CM Consult): Discharge Planning           MyChart Signup: No   Discharge  Durable Medical Equipment: No   Physical Therapy Consult: No   Occupational Therapy Consult: No   Speech Therapy Consult: No   Current Support Network: Relative's Home   Reason for Referral: DCP Rounds   History Provided By: Patient, Medical Record   Patient Orientation: Alert and Oriented, Person, Place, Situation, Self   Cognition: Alert   Support System Response: Cooperative   Previous Living Arrangement: Lives with Family Independent   Home Accessibility: Steps, Multi Level Home(3 steps )   Prior Functional Level: Independent in ADLs/IADLs   Current Functional Level: Independent in ADLs/IADLs       Can patient return to prior living arrangement: Yes   Ability to make needs known:: Good   Family able to assist with home care needs:: Yes   Pets: Dog(1 dog )   Needs help with pets while hospitalized: No       Types of Needs Identified: Disease Management Education, Treatment Education   Anticipated Discharge Needs: TCC   Confirm Follow Up Transport: Family   Confirm Transport and Arrange: Yes              DISCHARGE LOCATION   Discharge Placement: Home with family assistance

## 2018-07-22 NOTE — ED Notes (Signed)
Pt's mother asking about care, pt states he is giving this RN permission to speak to mother,Grindle Shon Baton (mother). Mother updated on care. Pt in low bed and on monitors. Pt aware urine needed.

## 2018-07-22 NOTE — ED Notes (Signed)
Pt aware stool sable needed. Pt in low bed.

## 2018-07-23 LAB — CBC WITH AUTO DIFFERENTIAL
Atypical Lymphocytes: 4.4 % — ABNORMAL HIGH (ref 0–0)
Basophils %: 1.5 % (ref 0–3)
Eosinophils %: 7.3 % — ABNORMAL HIGH (ref 0–5)
Hematocrit: 42.5 % (ref 37.0–50.0)
Hemoglobin: 14 gm/dl (ref 12.4–17.2)
Immature Granulocytes: 0.3 % (ref 0.0–3.0)
Lymphocytes %: 7.3 % — ABNORMAL LOW (ref 28–48)
MCH: 28.2 pg (ref 23.0–34.6)
MCHC: 32.9 gm/dl (ref 30.0–36.0)
MCV: 85.7 fL (ref 80.0–98.0)
MPV: 13.2 fL — ABNORMAL HIGH (ref 6.0–10.0)
Monocytes %: 26.5 % — ABNORMAL HIGH (ref 1–13)
Neutrophils %: 51.5 % (ref 34–64)
Nucleated RBCs: 0 (ref 0–0)
Platelet Comment: NORMAL
Platelets: 136 10*3/uL — ABNORMAL LOW (ref 140–450)
RBC: 4.96 M/uL (ref 3.80–5.70)
RDW-SD: 39.8 (ref 35.1–43.9)
UNIDENTIFIED MONONUCLEAR CELL, UNIDMONO: 1.5 % — ABNORMAL HIGH (ref 0–0)
WBC: 3 10*3/uL — ABNORMAL LOW (ref 4.0–11.0)

## 2018-07-23 LAB — COMPREHENSIVE METABOLIC PANEL
ALT: 27 U/L (ref 12–78)
AST: 29 U/L (ref 15–37)
Albumin: 3.2 gm/dl — ABNORMAL LOW (ref 3.4–5.0)
Alkaline Phosphatase: 54 U/L (ref 45–117)
Anion Gap: 6 mmol/L (ref 5–15)
BUN: 24 mg/dl (ref 7–25)
CO2: 21 mEq/L (ref 21–32)
Calcium: 7.9 mg/dl — ABNORMAL LOW (ref 8.5–10.1)
Chloride: 115 mEq/L — ABNORMAL HIGH (ref 98–107)
Creatinine: 1.4 mg/dl — ABNORMAL HIGH (ref 0.6–1.3)
EGFR IF NonAfrican American: >60
GFR African American: >60
Glucose: 85 mg/dl (ref 74–106)
Potassium: 3.1 mEq/L — ABNORMAL LOW (ref 3.5–5.1)
Sodium: 142 mEq/L (ref 136–145)
Total Bilirubin: 1.2 mg/dl — ABNORMAL HIGH (ref 0.2–1.0)
Total Protein: 7.6 gm/dl (ref 6.4–8.2)

## 2018-07-23 LAB — MAGNESIUM
Magnesium: 2.2 mg/dl (ref 1.6–2.6)
Magnesium: 2.2 mg/dl (ref 1.6–2.6)

## 2018-07-23 LAB — CBC WITH AUTOMATED DIFF
ATYPICAL LYMPHS: 4.4 % — ABNORMAL HIGH (ref 0–0)
BASOPHILS: 1.5 % (ref 0–3)
EOSINOPHILS: 7.3 % — ABNORMAL HIGH (ref 0–5)
HCT: 42.5 % (ref 37.0–50.0)
HGB: 14 gm/dl (ref 12.4–17.2)
IMMATURE GRANULOCYTES: 0.3 % (ref 0.0–3.0)
LYMPHOCYTES: 7.3 % — ABNORMAL LOW (ref 28–48)
MCH: 28.2 pg (ref 23.0–34.6)
MCHC: 32.9 gm/dl (ref 30.0–36.0)
MCV: 85.7 fL (ref 80.0–98.0)
MONOCYTES: 26.5 % — ABNORMAL HIGH (ref 1–13)
MPV: 13.2 fL — ABNORMAL HIGH (ref 6.0–10.0)
NEUTROPHILS: 51.5 % (ref 34–64)
NRBC: 0 (ref 0–0)
PLATELET COMMENTS: NORMAL
PLATELET: 136 10*3/uL — ABNORMAL LOW (ref 140–450)
RBC: 4.96 M/uL (ref 3.80–5.70)
RDW-SD: 39.8 (ref 35.1–43.9)
UNIDENTIFIED MONONUCLEAR CELL: 1.5 % — ABNORMAL HIGH (ref 0–0)
WBC: 3 10*3/uL — ABNORMAL LOW (ref 4.0–11.0)

## 2018-07-23 LAB — METABOLIC PANEL, COMPREHENSIVE
ALT (SGPT): 27 U/L (ref 12–78)
AST (SGOT): 29 U/L (ref 15–37)
Albumin: 3.2 gm/dl — ABNORMAL LOW (ref 3.4–5.0)
Alk. phosphatase: 54 U/L (ref 45–117)
Anion gap: 6 mmol/L (ref 5–15)
BUN: 24 mg/dl (ref 7–25)
Bilirubin, total: 1.2 mg/dl — ABNORMAL HIGH (ref 0.2–1.0)
CO2: 21 mEq/L (ref 21–32)
Calcium: 7.9 mg/dl — ABNORMAL LOW (ref 8.5–10.1)
Chloride: 115 mEq/L — ABNORMAL HIGH (ref 98–107)
Creatinine: 1.4 mg/dl — ABNORMAL HIGH (ref 0.6–1.3)
GFR est AA: >60
GFR est non-AA: >60
Glucose: 85 mg/dl (ref 74–106)
Potassium: 3.1 mEq/L — ABNORMAL LOW (ref 3.5–5.1)
Protein, total: 7.6 gm/dl (ref 6.4–8.2)
Sodium: 142 mEq/L (ref 136–145)

## 2018-07-23 MED ORDER — POTASSIUM CHLORIDE SR 10 MEQ TAB
10 mEq | ORAL | Status: AC
Start: 2018-07-23 — End: 2018-07-23
  Administered 2018-07-23: 16:00:00 via ORAL

## 2018-07-23 MED FILL — ZOSYN 3.375 GRAM/50 ML IN DEXTROSE (ISO-OSMOTIC) INTRAVENOUS PIGGYBACK: 3.375 gram/50 mL | INTRAVENOUS | Qty: 50

## 2018-07-23 MED FILL — BD POSIFLUSH NORMAL SALINE 0.9 % INJECTION SYRINGE: INTRAMUSCULAR | Qty: 10

## 2018-07-23 MED FILL — ONDANSETRON (PF) 4 MG/2 ML INJECTION: 4 mg/2 mL | INTRAMUSCULAR | Qty: 2

## 2018-07-23 MED FILL — POTASSIUM CHLORIDE SR 10 MEQ TAB: 10 mEq | ORAL | Qty: 2

## 2018-07-23 MED FILL — ACETAMINOPHEN 325 MG TABLET: 325 mg | ORAL | Qty: 2

## 2018-07-23 NOTE — Progress Notes (Signed)
Problem: Risk for Spread of Infection  Goal: Prevent transmission of infectious organism to others  Description: Prevent the transmission of infectious organisms to other patients, staff members, and visitors.  Outcome: Progressing Towards Goal

## 2018-07-23 NOTE — Progress Notes (Signed)
ADULT MALNUTRITION GUIDELINES    Patient is a 28 y.o. male, admitted on 07/21/2018 with a diagnosis of AKI (acute kidney injury) (HCC) [N17.9].  Nutrition assessment was completed by RD and the patient was found to meet the following malnutrition criteria established by ASPEN/AND:    Adult Malnutrition Guidelines:  SEVERE PROTEIN CALORIE MALNUTRITION IN THE CONTEXT OF ACUTE INJURY/ILLNESS  Energy intake: <50% energy intake compared to estimated energy needs >5 days   Wt loss 17.9% within 2 months time    Please document Severe  Protein Calorie Malnutrition in Problem List if in agreement      NUTRITION RECOMMENDATIONS:   Regular diet as tolerated when medically able   Rec adding Ensure Clear bid (provides 240 kcals, 8 g protein each) while pt on clear liquid diet      NUTRITION INITIAL EVALUATION    NUTRITION ASSESSMENT:       Reason for assessment: MST    Admitting diagnosis: AKI (acute kidney injury) (HCC) [N17.9]     PMH:   Past Medical History:   Diagnosis Date   ??? Asthma    ??? Hypertension    ??? Neurological disorder     Bell's palsy       Current pertinent medications: Zofran, NS    Pertinent labs: 5/28 - BUN/CR 38/4.0 (AKI), Na 135 (NS), K 3.6 (wnl), BS 123        Anthropometrics & Physical Assessment:   Height:   Ht Readings from Last 3 Encounters:   07/21/18 5\' 9"  (1.753 m)   04/10/18 5\' 8"  (1.727 m)   02/14/16 5\' 9"  (1.753 m)      Weight:   Wt Readings from Last 3 Encounters:   07/21/18 70.4 kg (155 lb 1.6 oz)   04/10/18 85.7 kg (189 lb)   02/14/16 87.5 kg (193 lb)         BMI: Body mass index is 22.9 kg/m??.    IBW: Ideal body weight: 70.7 kg (155 lb 13.8 oz)           UBW: 86-87 kg    Wt change: 17.9% wt loss within 3 months time         [x]  significant       []  not significant         [x]  intended         []  not intended    GI symptoms/issues:         Last Bowel Movement Date: 07/22/18     Abdominal Assessment: Intact  Bowel Sounds: Active     Chewing/swallowing issues:   None reported    Skin integrity:    intact       ?? Muscle wasting:          ?? Other: not assessed at this time    ?? Fat wasting:  ?? Other: not assessed at this time                 Fluid accumulation:    none     Diet and intake history:   ?? Current diet order: DIET CLEAR LIQUID    ?? Food allergies: NKFA    ?? Diet/intake history: Poor oral intake x 24 hrs due to n/v. Pt has been trying to loose wt by changing diet over the last 2 months. Pt reports cutting calories in half to approx 1000 calories a day and cutting out salt , sugar and fried foods. Pt reports eating 1-2 meals daily drinking only  water and pomegranate juice.             []  <50% intake x >5 days         []  <50% intake x >1 month         []  <75% intake x >7 days         []  <75% intake x 1 month         []  <75% intake x 3 months    ?? Current appetite/PO intake:npo   No data recorded     Assessment of Current MNT: Diet is appropriate but intake is inadequate to meet nutritional needs    Estimated daily nutrition intake needs:  ?? 2070 - 2153 kcals (MSJ 1.25-1.3 cal/kgbw)    ?? 70.4 - 84 g protein (1.0-1.2gm/kgbw)    ?? 1760 mL fluid (25 ml/kgbw)    NUTRITION DIAGNOSIS:   .  1. .Severe acute pcm related to Inadequate calorie and nutrient intake related to intentional severe wt loss as evidenced by diet recall of pt consuming 1 -2 meals daily totaling 1000 calories per day, < 50% intake > 5 days and wt loss of 17.9% within 2 months time.     2.          Altered gi function related to enterits as evidenced by abdominal pain, diarrhea and need for clear liquid diet.    NUTRITION INTERVENTION / RECOMMENDATIONS:     Regular diet as tolerated when medically able  Rec Ensure Clear BID (provides 240 kcals, 8 g protein each) while pt on clear liquid diet      NUTRITION MONITORING AND EVALUATION:     Nutrition level of care:  []  low       []  moderate      [x]  high    Nutrition monitoring: MNT initiation and tolerance, wt, labs (BG, CMP), medical changes & treatment plan of care     Nutrition goals:  Goal to meet 75% of nutrition needs or greater, dry wt stable through LOS, nutrition related lab values WNL, maintain skin integrity    Brief verbal education provided on healthy eating and wt loss and importance of protein in the diet.    Roxie Leticia ClasIngram Sneddon, RD  07/23/18

## 2018-07-23 NOTE — Progress Notes (Signed)
Medical Progress Note      NAME: Travis [name]   DOB:  1990-07-22  MRN:             [id number]    Date/Time: 07/23/2018  1:00 PM      Assessment:     1. AKI  2. Enteritis  3. Abdominal pain  4. HTN,   5. Diarrhea   6. Asthma   7. Hx of Bell's palsy   8. Severe protein calorie malnutrition    Plan:     ?? Continue current medical management   ?? Continue [name] antibiotics currently on Zosyn   ?? Advance diet as tolerated we will try full liquid today  ?? COVID-19 test is negative  ?? Monitor labs and electrolytes   ?? Discussed with the patient  ?? Discussed with his mom    Subjective:     Denies fever or chills, he had nausea and vomiting, has abdominal pain    Objective:     Vitals:    Last 24hrs VS reviewed since prior progress note. Most recent are:  Visit Vitals  BP 150/88 (BP 1 Location: Left arm, BP Patient Position: Supine)   Pulse 62   Temp 98.3 ??F (36.8 ??C)   Resp 18   Ht 5' 9"  (1.753 m)   Wt 70.4 kg (155 lb 1.6 oz)   SpO2 100%   BMI 22.90 kg/m??     SpO2 Readings from Last 6 Encounters:   07/23/18 100%   04/10/18 99%   02/14/16 97%   01/27/16 99%   09/18/15 98%   06/05/15 99%            Intake/Output Summary (Last 24 hours) at 07/23/2018 1225  Last data filed at 07/23/2018 4782  Gross per 24 hour   Intake 900 ml   Output ???   Net 900 ml        Exam:     General:   Not in acute distress  HEENT: PERRLA, Neck Supple,  No JVD  Respiratory:   CTA bilaterally-no wheezes, rales, rhonchi, or crackles  Cardiac:  Regular Rate and Rythmn  - no murmurs, rubs or gallops  Abdominal:  Soft, non-tender, non-distended, positive bowel sounds  Extremities:  No cyanosis, or edema.  Skin: No rash  Neurological:  No focal neurological deficits    Medication:   Current Medications Reviewed    Current Facility-Administered Medications:   ???  ondansetron (ZOFRAN) injection 4 mg, 4 mg, IntraVENous, Q6H PRN, Jasarevic, Muhamed, MD, 4 mg at 07/23/18 0857  ???  sodium chloride (NS) flush 5-10 mL, 5-10 mL, IntraVENous, Q8H,  Jasarevic, Muhamed, MD, 10 mL at 07/23/18 0518  ???  sodium chloride (NS) flush 5-10 mL, 5-10 mL, IntraVENous, PRN, Jasarevic, Muhamed, MD, 10 mL at 07/23/18 0857  ???  naloxone (NARCAN) injection 0.1 mg, 0.1 mg, IntraVENous, PRN, Jasarevic, Muhamed, MD  ???  acetaminophen (TYLENOL) tablet 650 mg, 650 mg, Oral, Q6H PRN, Dawson Bills, Muhamed, MD, 650 mg at 07/23/18 0857  ???  albuterol (PROVENTIL VENTOLIN) nebulizer solution 2.5 mg, 2.5 mg, Nebulization, Q4H PRN, Jasarevic, Muhamed, MD  ???  piperacillin-tazobactam (ZOSYN) 3.375 g IVPB (premix), 3.375 g, IntraVENous, Q8H, Jasarevic, Muhamed, MD, 3.375 g at 07/23/18 0518      Lab:     Lab Data Reviewed: (see below)  Recent Results (from the past 24 hour(s))   METABOLIC PANEL, COMPREHENSIVE    Collection Time: 07/23/18  6:50 AM   Result Value Ref Range  Sodium 142 136 - 145 mEq/L    Potassium 3.1 (L) 3.5 - 5.1 mEq/L    Chloride 115 (H) 98 - 107 mEq/L    CO2 21 21 - 32 mEq/L    Glucose 85 74 - 106 mg/dl    BUN 24 7 - 25 mg/dl    Creatinine 1.4 (H) 0.6 - 1.3 mg/dl    GFR est AA >60      GFR est non-AA >60      Calcium 7.9 (L) 8.5 - 10.1 mg/dl    AST (SGOT) 29 15 - 37 U/L    ALT (SGPT) 27 12 - 78 U/L    Alk. phosphatase 54 45 - 117 U/L    Bilirubin, total 1.2 (H) 0.2 - 1.0 mg/dl    Protein, total 7.6 6.4 - 8.2 gm/dl    Albumin 3.2 (L) 3.4 - 5.0 gm/dl    Anion gap 6 5 - 15 mmol/L   CBC WITH AUTOMATED DIFF    Collection Time: 07/23/18  6:50 AM   Result Value Ref Range    WBC 3.0 (L) 4.0 - 11.0 1000/mm3    RBC 4.96 3.80 - 5.70 M/uL    HGB 14.0 12.4 - 17.2 gm/dl    HCT 42.5 37.0 - 50.0 %    MCV 85.7 80.0 - 98.0 fL    MCH 28.2 23.0 - 34.6 pg    MCHC 32.9 30.0 - 36.0 gm/dl    PLATELET 136 (L) 140 - 450 1000/mm3    MPV 13.2 (H) 6.0 - 10.0 fL    RDW-SD 39.8 35.1 - 43.9      NRBC 0 0 - 0      IMMATURE GRANULOCYTES 0.3 0.0 - 3.0 %    NEUTROPHILS 51.5 34 - 64 %    LYMPHOCYTES 7.3 (L) 28 - 48 %    ATYPICAL LYMPHS 4.4 (H) 0 - 0 %    MONOCYTES 26.5 (H) 1 - 13 %    EOSINOPHILS 7.3 (H) 0 - 5 %     BASOPHILS 1.5 0 - 3 %    UNIDENTIFIED MONONUCLEAR CELL 1.5 (H) 0 - 0 %    RBC Morphology SEE FOOTNOTE      Poikilocytosis 2+      Elliptocytes 1+      Crenated RBCs 1+      PLATELET COMMENTS NORMAL     MAGNESIUM    Collection Time: 07/23/18  6:50 AM   Result Value Ref Range    Magnesium 2.2 1.6 - 2.6 mg/dl       No results found.    Active Problems:    AKI (acute kidney injury) ([place]) (07/22/2018)      ____________________________________________________________________      Attending Physician: Stanton Kidney, MD

## 2018-07-23 NOTE — Progress Notes (Signed)
Medical Progress Note      NAME: Travis [name]   DOB:  03-01-1990  MRN:             [id number]    Date/Time: 07/23/2018  1:00 PM      Assessment:     1. AKI  2. Enteritis  3. Abdominal pain  4. HTN,   5. Diarrhea   6. Asthma   7. Hx of Bell's palsy   8. Severe protein calorie malnutrition    Plan:     ?? Continue current medical management   ?? Continue [name] antibiotics currently on Zosyn   ?? Advance diet as tolerated we will try full liquid today  ?? COVID-19 test is negative  ?? Monitor labs and electrolytes   ?? Discussed with the patient  ?? Discussed with his mom    Subjective:     Denies fever or chills, he had nausea and vomiting, has abdominal pain    Objective:     Vitals:    Last 24hrs VS reviewed since prior progress note. Most recent are:  Visit Vitals  BP 150/88 (BP 1 Location: Left arm, BP Patient Position: Supine)   Pulse 62   Temp 98.3 ??F (36.8 ??C)   Resp 18   Ht 5' 9"  (1.753 m)   Wt 70.4 kg (155 lb 1.6 oz)   SpO2 100%   BMI 22.90 kg/m??     SpO2 Readings from Last 6 Encounters:   07/23/18 100%   04/10/18 99%   02/14/16 97%   01/27/16 99%   09/18/15 98%   06/05/15 99%            Intake/Output Summary (Last 24 hours) at 07/23/2018 1225  Last data filed at 07/23/2018 9326  Gross per 24 hour   Intake 900 ml   Output ???   Net 900 ml        Exam:     General:   Not in acute distress  HEENT: PERRLA, Neck Supple,  No JVD  Respiratory:   CTA bilaterally-no wheezes, rales, rhonchi, or crackles  Cardiac:  Regular Rate and Rythmn  - no murmurs, rubs or gallops  Abdominal:  Soft, non-tender, non-distended, positive bowel sounds  Extremities:  No cyanosis, or edema.  Skin: No rash  Neurological:  No focal neurological deficits    Medication:   Current Medications Reviewed    Current Facility-Administered Medications:   ???  ondansetron (ZOFRAN) injection 4 mg, 4 mg, IntraVENous, Q6H PRN, Jasarevic, Muhamed, MD, 4 mg at 07/23/18 0857  ???  sodium chloride (NS) flush 5-10 mL, 5-10 mL, IntraVENous, Q8H, Jasarevic,  Muhamed, MD, 10 mL at 07/23/18 0518  ???  sodium chloride (NS) flush 5-10 mL, 5-10 mL, IntraVENous, PRN, Jasarevic, Muhamed, MD, 10 mL at 07/23/18 0857  ???  naloxone (NARCAN) injection 0.1 mg, 0.1 mg, IntraVENous, PRN, Jasarevic, Muhamed, MD  ???  acetaminophen (TYLENOL) tablet 650 mg, 650 mg, Oral, Q6H PRN, Dawson Bills, Muhamed, MD, 650 mg at 07/23/18 0857  ???  albuterol (PROVENTIL VENTOLIN) nebulizer solution 2.5 mg, 2.5 mg, Nebulization, Q4H PRN, Jasarevic, Muhamed, MD  ???  piperacillin-tazobactam (ZOSYN) 3.375 g IVPB (premix), 3.375 g, IntraVENous, Q8H, Jasarevic, Muhamed, MD, 3.375 g at 07/23/18 0518      Lab:     Lab Data Reviewed: (see below)  Recent Results (from the past 24 hour(s))   METABOLIC PANEL, COMPREHENSIVE    Collection Time: 07/23/18  6:50 AM   Result Value Ref Range  Sodium 142 136 - 145 mEq/L    Potassium 3.1 (L) 3.5 - 5.1 mEq/L    Chloride 115 (H) 98 - 107 mEq/L    CO2 21 21 - 32 mEq/L    Glucose 85 74 - 106 mg/dl    BUN 24 7 - 25 mg/dl    Creatinine 1.4 (H) 0.6 - 1.3 mg/dl    GFR est AA >60      GFR est non-AA >60      Calcium 7.9 (L) 8.5 - 10.1 mg/dl    AST (SGOT) 29 15 - 37 U/L    ALT (SGPT) 27 12 - 78 U/L    Alk. phosphatase 54 45 - 117 U/L    Bilirubin, total 1.2 (H) 0.2 - 1.0 mg/dl    Protein, total 7.6 6.4 - 8.2 gm/dl    Albumin 3.2 (L) 3.4 - 5.0 gm/dl    Anion gap 6 5 - 15 mmol/L   CBC WITH AUTOMATED DIFF    Collection Time: 07/23/18  6:50 AM   Result Value Ref Range    WBC 3.0 (L) 4.0 - 11.0 1000/mm3    RBC 4.96 3.80 - 5.70 M/uL    HGB 14.0 12.4 - 17.2 gm/dl    HCT 42.5 37.0 - 50.0 %    MCV 85.7 80.0 - 98.0 fL    MCH 28.2 23.0 - 34.6 pg    MCHC 32.9 30.0 - 36.0 gm/dl    PLATELET 136 (L) 140 - 450 1000/mm3    MPV 13.2 (H) 6.0 - 10.0 fL    RDW-SD 39.8 35.1 - 43.9      NRBC 0 0 - 0      IMMATURE GRANULOCYTES 0.3 0.0 - 3.0 %    NEUTROPHILS 51.5 34 - 64 %    LYMPHOCYTES 7.3 (L) 28 - 48 %    ATYPICAL LYMPHS 4.4 (H) 0 - 0 %    MONOCYTES 26.5 (H) 1 - 13 %    EOSINOPHILS 7.3 (H) 0 - 5 %    BASOPHILS  1.5 0 - 3 %    UNIDENTIFIED MONONUCLEAR CELL 1.5 (H) 0 - 0 %    RBC Morphology SEE FOOTNOTE      Poikilocytosis 2+      Elliptocytes 1+      Crenated RBCs 1+      PLATELET COMMENTS NORMAL     MAGNESIUM    Collection Time: 07/23/18  6:50 AM   Result Value Ref Range    Magnesium 2.2 1.6 - 2.6 mg/dl       No results found.    Active Problems:    AKI (acute kidney injury) ([place]) (07/22/2018)      ____________________________________________________________________      Attending Physician: Stanton Kidney, MD

## 2018-07-23 NOTE — Progress Notes (Signed)
ADULT MALNUTRITION GUIDELINES    Patient is a 28 y.o. male, admitted on 07/21/2018 with a diagnosis of AKI (acute kidney injury) ([facility]) [N17.9].  Nutrition assessment was completed by RD and the patient was found to meet the following malnutrition criteria established by [facility]/AND:    Adult Malnutrition Guidelines:  SEVERE PROTEIN CALORIE MALNUTRITION IN THE CONTEXT OF ACUTE INJURY/ILLNESS  Energy intake: <50% energy intake compared to estimated energy needs >5 days   Wt loss 17.9% within 2 months time    Please document Severe  Protein Calorie Malnutrition in Problem List if in agreement      NUTRITION RECOMMENDATIONS:   Regular diet as tolerated when medically able   Rec adding Ensure Clear bid (provides 240 kcals, 8 g protein each) while pt on clear liquid diet      NUTRITION INITIAL EVALUATION    NUTRITION ASSESSMENT:       Reason for assessment: MST    Admitting diagnosis: AKI (acute kidney injury) ([facility]) [N17.9]     PMH:   Past Medical History:   Diagnosis Date   . Asthma    . Hypertension    . Neurological disorder     Bell's palsy       Current pertinent medications: Zofran, NS    Pertinent labs: 5/28 - BUN/CR 38/4.0 (AKI), Na 135 (NS), K 3.6 (wnl), BS 123        Anthropometrics & Physical Assessment:   Height:   Ht Readings from Last 3 Encounters:   07/21/18 5\' 9"  (1.753 m)   04/10/18 5\' 8"  (1.727 m)   02/14/16 5\' 9"  (1.753 m)      Weight:   Wt Readings from Last 3 Encounters:   07/21/18 70.4 kg (155 lb 1.6 oz)   04/10/18 85.7 kg (189 lb)   02/14/16 87.5 kg (193 lb)         BMI: Body mass index is 22.9 kg/m.    IBW: Ideal body weight: 70.7 kg (155 lb 13.8 oz)           UBW: 86-87 kg    Wt change: 17.9% wt loss within 3 months time         [x]  significant       []  not significant         [x]  intended         []  not intended    GI symptoms/issues:         Last Bowel Movement Date: 07/22/18     Abdominal Assessment: Intact  Bowel Sounds: Active     Chewing/swallowing issues:   None reported    Skin integrity:    intact        Muscle wasting:           Other: not assessed at this time     Fat wasting:   Other: not assessed at this time                 Fluid accumulation:    none     Diet and intake history:    Current diet order: DIET CLEAR LIQUID     Food allergies: NKFA     Diet/intake history: Poor oral intake x 24 hrs due to n/v. Pt has been trying to loose wt by changing diet over the last 2 months. Pt reports cutting calories in half to approx 1000 calories a day and cutting out salt , sugar and fried foods. Pt reports eating 1-2 meals daily drinking only  water and pomegranate juice.             []  <50% intake x >5 days         []  <50% intake x >1 month         []  <75% intake x >7 days         []  <75% intake x 1 month         []  <75% intake x 3 months     Current appetite/PO intake:npo   No data recorded     Assessment of Current MNT: Diet is appropriate but intake is inadequate to meet nutritional needs    Estimated daily nutrition intake needs:   2070 - 2153 kcals (MSJ 1.25-1.3 cal/kgbw)     70.4 - 84 g protein (1.0-1.2gm/kgbw)     1760 mL fluid (25 ml/kgbw)    NUTRITION DIAGNOSIS:   .  1. .Severe acute pcm related to Inadequate calorie and nutrient intake related to intentional severe wt loss as evidenced by diet recall of pt consuming 1 -2 meals daily totaling 1000 calories per day, < 50% intake > 5 days and wt loss of 17.9% within 2 months time.     2.          Altered gi function related to enterits as evidenced by abdominal pain, diarrhea and need for clear liquid diet.    NUTRITION INTERVENTION / RECOMMENDATIONS:     Regular diet as tolerated when medically able  Rec Ensure Clear BID (provides 240 kcals, 8 g protein each) while pt on clear liquid diet      NUTRITION MONITORING AND EVALUATION:     Nutrition level of care:  []  low       []  moderate      [x]  high    Nutrition monitoring: MNT initiation and tolerance, wt, labs (BG, CMP), medical changes & treatment plan of care    Nutrition goals:   Goal to meet 75% of nutrition needs or greater, dry wt stable through LOS, nutrition related lab values WNL, maintain skin integrity    Brief verbal education provided on healthy eating and wt loss and importance of protein in the diet.    Roxie Leticia Clas, RD  07/23/18

## 2018-07-24 LAB — BASIC METABOLIC PANEL
Anion Gap: 4 mmol/L — ABNORMAL LOW (ref 5–15)
BUN: 15 mg/dl (ref 7–25)
CO2: 24 mEq/L (ref 21–32)
Calcium: 8.1 mg/dl — ABNORMAL LOW (ref 8.5–10.1)
Chloride: 114 mEq/L — ABNORMAL HIGH (ref 98–107)
Creatinine: 1.2 mg/dl (ref 0.6–1.3)
EGFR IF NonAfrican American: >60
GFR African American: >60
Glucose: 80 mg/dl (ref 74–106)
Potassium: 3.1 mEq/L — ABNORMAL LOW (ref 3.5–5.1)
Sodium: 142 mEq/L (ref 136–145)

## 2018-07-24 LAB — CBC WITH AUTO DIFFERENTIAL
Basophils %: 0.5 % (ref 0–3)
Eosinophils %: 5.7 % — ABNORMAL HIGH (ref 0–5)
Hematocrit: 41 % (ref 37.0–50.0)
Hemoglobin: 13.9 gm/dl (ref 12.4–17.2)
Immature Granulocytes: 0.3 % (ref 0.0–3.0)
Lymphocytes %: 24.9 % — ABNORMAL LOW (ref 28–48)
MCH: 28.7 pg (ref 23.0–34.6)
MCHC: 33.9 gm/dl (ref 30.0–36.0)
MCV: 84.7 fL (ref 80.0–98.0)
MPV: 12.9 fL — ABNORMAL HIGH (ref 6.0–10.0)
Monocytes %: 23 % — ABNORMAL HIGH (ref 1–13)
Neutrophils %: 45.6 % (ref 34–64)
Nucleated RBCs: 0 (ref 0–0)
Platelets: 129 10*3/uL — ABNORMAL LOW (ref 140–450)
RBC: 4.84 M/uL (ref 3.80–5.70)
RDW-SD: 40.5 (ref 35.1–43.9)
WBC: 3.7 10*3/uL — ABNORMAL LOW (ref 4.0–11.0)

## 2018-07-24 LAB — MAGNESIUM
Magnesium: 2.4 mg/dl (ref 1.6–2.6)
Magnesium: 2.4 mg/dl (ref 1.6–2.6)

## 2018-07-24 LAB — METABOLIC PANEL, BASIC
Anion gap: 4 mmol/L — ABNORMAL LOW (ref 5–15)
BUN: 15 mg/dl (ref 7–25)
CO2: 24 mEq/L (ref 21–32)
Calcium: 8.1 mg/dl — ABNORMAL LOW (ref 8.5–10.1)
Chloride: 114 mEq/L — ABNORMAL HIGH (ref 98–107)
Creatinine: 1.2 mg/dl (ref 0.6–1.3)
GFR est AA: >60
GFR est non-AA: >60
Glucose: 80 mg/dl (ref 74–106)
Potassium: 3.1 mEq/L — ABNORMAL LOW (ref 3.5–5.1)
Sodium: 142 mEq/L (ref 136–145)

## 2018-07-24 LAB — CBC WITH AUTOMATED DIFF
BASOPHILS: 0.5 % (ref 0–3)
EOSINOPHILS: 5.7 % — ABNORMAL HIGH (ref 0–5)
HCT: 41 % (ref 37.0–50.0)
HGB: 13.9 gm/dl (ref 12.4–17.2)
IMMATURE GRANULOCYTES: 0.3 % (ref 0.0–3.0)
LYMPHOCYTES: 24.9 % — ABNORMAL LOW (ref 28–48)
MCH: 28.7 pg (ref 23.0–34.6)
MCHC: 33.9 gm/dl (ref 30.0–36.0)
MCV: 84.7 fL (ref 80.0–98.0)
MONOCYTES: 23 % — ABNORMAL HIGH (ref 1–13)
MPV: 12.9 fL — ABNORMAL HIGH (ref 6.0–10.0)
NEUTROPHILS: 45.6 % (ref 34–64)
NRBC: 0 (ref 0–0)
PLATELET: 129 10*3/uL — ABNORMAL LOW (ref 140–450)
RBC: 4.84 M/uL (ref 3.80–5.70)
RDW-SD: 40.5 (ref 35.1–43.9)
WBC: 3.7 10*3/uL — ABNORMAL LOW (ref 4.0–11.0)

## 2018-07-24 MED ORDER — OXYCODONE-ACETAMINOPHEN 5 MG-325 MG TAB
5-325 mg | ORAL | Status: DC | PRN
Start: 2018-07-24 — End: 2018-07-30
  Administered 2018-07-24 – 2018-07-30 (×26): via ORAL

## 2018-07-24 MED ORDER — SIMETHICONE 80 MG CHEWABLE TAB
80 mg | Freq: Four times a day (QID) | ORAL | Status: DC | PRN
Start: 2018-07-24 — End: 2018-07-31
  Administered 2018-07-25 – 2018-07-30 (×3): via ORAL

## 2018-07-24 MED ORDER — SODIUM CHLORIDE 0.9 % IV
INTRAVENOUS | Status: DC
Start: 2018-07-24 — End: 2018-07-25
  Administered 2018-07-24 – 2018-07-25 (×3): via INTRAVENOUS

## 2018-07-24 MED ORDER — POTASSIUM CHLORIDE SR 20 MEQ TAB, PARTICLES/CRYSTALS
20 mEq | ORAL | Status: AC
Start: 2018-07-24 — End: 2018-07-24
  Administered 2018-07-24: 16:00:00 via ORAL

## 2018-07-24 MED FILL — ACETAMINOPHEN 325 MG TABLET: 325 mg | ORAL | Qty: 2

## 2018-07-24 MED FILL — ZOSYN 3.375 GRAM/50 ML IN DEXTROSE (ISO-OSMOTIC) INTRAVENOUS PIGGYBACK: 3.375 gram/50 mL | INTRAVENOUS | Qty: 50

## 2018-07-24 MED FILL — OXYCODONE-ACETAMINOPHEN 5 MG-325 MG TAB: 5-325 mg | ORAL | Qty: 1

## 2018-07-24 MED FILL — BD POSIFLUSH NORMAL SALINE 0.9 % INJECTION SYRINGE: INTRAMUSCULAR | Qty: 10

## 2018-07-24 MED FILL — POTASSIUM CHLORIDE SR 20 MEQ TAB, PARTICLES/CRYSTALS: 20 mEq | ORAL | Qty: 2

## 2018-07-24 MED FILL — SODIUM CHLORIDE 0.9 % IV: INTRAVENOUS | Qty: 1000

## 2018-07-24 NOTE — Progress Notes (Signed)
Problem: Risk for Spread of Infection  Goal: Prevent transmission of infectious organism to others  Description: Prevent the transmission of infectious organisms to other patients, staff members, and visitors.  Outcome: Progressing Towards Goal     Problem: Falls - Risk of  Goal: *Absence of Falls  Description: Document Schmid Fall Risk and appropriate interventions in the flowsheet.  Outcome: Progressing Towards Goal  Note: Fall Risk Interventions:            Medication Interventions: Teach patient to arise slowly, Patient to call before getting OOB, Evaluate medications/consider consulting pharmacy         History of Falls Interventions: Evaluate medications/consider consulting pharmacy

## 2018-07-24 NOTE — Progress Notes (Signed)
Medical Progress Note      NAME: Travis [name]   DOB:  11/25/1990  MRN:             [id number]    Date/Time: 07/24/2018  12:36 PM      Assessment:     1. AKI  2. Enteritis  3. Abdominal pain  4. HTN,   5. Diarrhea   6. Asthma   7. Hx of Bell's palsy   8. Severe protein calorie malnutrition    Plan:     ?? Renal function has significantly improved with [name] fluid hydration   ?? We will decrease [name] fluid   ?? Encouraged to increase oral intake   ?? Advance diet to regular diet today if he tolerates   ?? Continue current [name] antibiotics with Zosyn   ?? If patient continues to tolerate diet possibly can be discharged tomorrow   ?? Patient is COVID 19 tested per his mom request and is negative  ?? His mom also asked good if he can be tested for HIV, discussed with the patient and he said that he has been tested last month on April 26 and was negative.  He does not want to do it again.  ?? Discussed with his mom and updated her today    Subjective:     Patient still complaining abdominal pain but is slowly improving, no fever or chills    Objective:     Vitals:    Last 24hrs VS reviewed since prior progress note. Most recent are:  Visit Vitals  BP 137/87 (BP 1 Location: Left arm, BP Patient Position: Supine)   Pulse 68   Temp 98.1 ??F (36.7 ??C)   Resp 18   Ht 5\' 9"  (1.753 m)   Wt 70.4 kg (155 lb 1.6 oz)   SpO2 100%   BMI 22.90 kg/m??     SpO2 Readings from Last 6 Encounters:   07/24/18 100%   04/10/18 99%   02/14/16 97%   01/27/16 99%   09/18/15 98%   06/05/15 99%            Intake/Output Summary (Last 24 hours) at 07/24/2018 1235  Last data filed at 07/24/2018 0811  Gross per 24 hour   Intake 1270 ml   Output ???   Net 1270 ml        Exam:     General:   Not in acute distress  HEENT: PERRLA, Neck Supple,  No JVD  Respiratory:   CTA bilaterally-no wheezes, rales, rhonchi, or crackles  Cardiac:  Regular Rate and Rythmn  - no murmurs, rubs or gallops  Abdominal:  Soft, non-tender, non-distended, positive bowel sounds   Extremities:  No cyanosis, or edema.  Skin: No rash  Neurological:  No focal neurological deficits    Medication:   Current Medications Reviewed    Current Facility-Administered Medications:   ???  oxyCODONE-acetaminophen (PERCOCET) 5-325 mg per tablet 1 Tab, 1 Tab, Oral, Q4H PRN, Theodis Aguaserefe, Crystol Walpole G, MD, 1 Tab at 07/24/18 1144  ???  ondansetron (ZOFRAN) injection 4 mg, 4 mg, IntraVENous, Q6H PRN, Earlie LouJasarevic, Muhamed, MD, 4 mg at 07/23/18 1453  ???  sodium chloride (NS) flush 5-10 mL, 5-10 mL, IntraVENous, Q8H, Jasarevic, Muhamed, MD, 10 mL at 07/24/18 0643  ???  sodium chloride (NS) flush 5-10 mL, 5-10 mL, IntraVENous, PRN, Jasarevic, Muhamed, MD, 10 mL at 07/23/18 0857  ???  naloxone (NARCAN) injection 0.1 mg, 0.1 mg, IntraVENous, PRN, Irving BurtonJasarevic, Muhamed, MD  ???  albuterol (PROVENTIL VENTOLIN) nebulizer solution 2.5 mg, 2.5 mg, Nebulization, Q4H PRN, Earlie Lou, Muhamed, MD  ???  piperacillin-tazobactam (ZOSYN) 3.375 g IVPB (premix), 3.375 g, IntraVENous, Q8H, Jasarevic, Muhamed, MD, Stopped at 07/24/18 0811      Lab:     Lab Data Reviewed: (see below)  Recent Results (from the past 24 hour(s))   CBC WITH AUTOMATED DIFF    Collection Time: 07/24/18  6:50 AM   Result Value Ref Range    WBC 3.7 (L) 4.0 - 11.0 1000/mm3    RBC 4.84 3.80 - 5.70 M/uL    HGB 13.9 12.4 - 17.2 gm/dl    HCT 28.7 86.7 - 67.2 %    MCV 84.7 80.0 - 98.0 fL    MCH 28.7 23.0 - 34.6 pg    MCHC 33.9 30.0 - 36.0 gm/dl    PLATELET 094 (L) 709 - 450 1000/mm3    MPV 12.9 (H) 6.0 - 10.0 fL    RDW-SD 40.5 35.1 - 43.9      NRBC 0 0 - 0      IMMATURE GRANULOCYTES 0.3 0.0 - 3.0 %    NEUTROPHILS 45.6 34 - 64 %    LYMPHOCYTES 24.9 (L) 28 - 48 %    MONOCYTES 23.0 (H) 1 - 13 %    EOSINOPHILS 5.7 (H) 0 - 5 %    BASOPHILS 0.5 0 - 3 %   MAGNESIUM    Collection Time: 07/24/18  6:50 AM   Result Value Ref Range    Magnesium 2.4 1.6 - 2.6 mg/dl   METABOLIC PANEL, BASIC    Collection Time: 07/24/18  6:50 AM   Result Value Ref Range    Sodium 142 136 - 145 mEq/L     Potassium 3.1 (L) 3.5 - 5.1 mEq/L    Chloride 114 (H) 98 - 107 mEq/L    CO2 24 21 - 32 mEq/L    Glucose 80 74 - 106 mg/dl    BUN 15 7 - 25 mg/dl    Creatinine 1.2 0.6 - 1.3 mg/dl    GFR est AA >62      GFR est non-AA >60      Calcium 8.1 (L) 8.5 - 10.1 mg/dl    Anion gap 4 (L) 5 - 15 mmol/L       No results found.    Active Problems:    AKI (acute kidney injury) ([facility]) (07/22/2018)      ____________________________________________________________________      Attending Physician: Theodis Aguas, MD

## 2018-07-24 NOTE — Progress Notes (Signed)
Problem: Patient Education:  Go to Education Activity  Goal: Patient/Family Education  Outcome: Progressing Towards Goal     Problem: Risk for Spread of Infection  Goal: Prevent transmission of infectious organism to others  Description: Prevent the transmission of infectious organisms to other patients, staff members, and visitors.  Outcome: Progressing Towards Goal     Problem: Falls - Risk of  Goal: *Absence of Falls  Description: Document Schmid Fall Risk and appropriate interventions in the flowsheet.  Outcome: Progressing Towards Goal  Note: Fall Risk Interventions:            Medication Interventions: Patient to call before getting OOB         History of Falls Interventions: Bed/chair exit alarm

## 2018-07-24 NOTE — Progress Notes (Signed)
Problem: Risk for Spread of Infection  Goal: Prevent transmission of infectious organism to others  Description: Prevent the transmission of infectious organisms to other patients, staff members, and visitors.  Outcome: Progressing Towards Goal

## 2018-07-24 NOTE — Progress Notes (Signed)
Problem: Risk for Spread of Infection  Goal: Prevent transmission of infectious organism to others  Description: Prevent the transmission of infectious organisms to other patients, staff members, and visitors.  Outcome: Progressing Towards Goal

## 2018-07-24 NOTE — Progress Notes (Signed)
Problem: Risk for Spread of Infection  Goal: Prevent transmission of infectious organism to others  Description: Prevent the transmission of infectious organisms to other patients, staff members, and visitors.  Outcome: Progressing Towards Goal     Problem: Falls - Risk of  Goal: *Absence of Falls  Description: Document Travis [name] Fall Risk and appropriate interventions in the flowsheet.  Outcome: Progressing Towards Goal  Note: Fall Risk Interventions:            Medication Interventions: Teach patient to arise slowly, Patient to call before getting OOB, Evaluate medications/consider consulting pharmacy         History of Falls Interventions: Evaluate medications/consider consulting pharmacy

## 2018-07-24 NOTE — Progress Notes (Signed)
Medical Progress Note      NAME: Travis [name]   DOB:  Jul 17, 1990  MRN:             [id number]    Date/Time: 07/24/2018  12:36 PM      Assessment:     1. AKI  2. Enteritis  3. Abdominal pain  4. HTN,   5. Diarrhea   6. Asthma   7. Hx of Bell's palsy   8. Severe protein calorie malnutrition    Plan:     ?? Renal function has significantly improved with [name] fluid hydration   ?? We will decrease [name] fluid   ?? Encouraged to increase oral intake   ?? Advance diet to regular diet today if he tolerates   ?? Continue current [name] antibiotics with Zosyn   ?? If patient continues to tolerate diet possibly can be discharged tomorrow   ?? Patient is COVID 19 tested per his mom request and is negative  ?? His mom also asked good if he can be tested for HIV, discussed with the patient and he said that he has been tested last month on April 26 and was negative.  He does not want to do it again.  ?? Discussed with his mom and updated her today    Subjective:     Patient still complaining abdominal pain but is slowly improving, no fever or chills    Objective:     Vitals:    Last 24hrs VS reviewed since prior progress note. Most recent are:  Visit Vitals  BP 137/87 (BP 1 Location: Left arm, BP Patient Position: Supine)   Pulse 68   Temp 98.1 ??F (36.7 ??C)   Resp 18   Ht 5\' 9"  (1.753 m)   Wt 70.4 kg (155 lb 1.6 oz)   SpO2 100%   BMI 22.90 kg/m??     SpO2 Readings from Last 6 Encounters:   07/24/18 100%   04/10/18 99%   02/14/16 97%   01/27/16 99%   09/18/15 98%   06/05/15 99%            Intake/Output Summary (Last 24 hours) at 07/24/2018 1235  Last data filed at 07/24/2018 0811  Gross per 24 hour   Intake 1270 ml   Output ???   Net 1270 ml        Exam:     General:   Not in acute distress  HEENT: PERRLA, Neck Supple,  No JVD  Respiratory:   CTA bilaterally-no wheezes, rales, rhonchi, or crackles  Cardiac:  Regular Rate and Rythmn  - no murmurs, rubs or gallops  Abdominal:  Soft, non-tender, non-distended, positive bowel  sounds  Extremities:  No cyanosis, or edema.  Skin: No rash  Neurological:  No focal neurological deficits    Medication:   Current Medications Reviewed    Current Facility-Administered Medications:   ???  oxyCODONE-acetaminophen (PERCOCET) 5-325 mg per tablet 1 Tab, 1 Tab, Oral, Q4H PRN, Theodis Aguas, MD, 1 Tab at 07/24/18 1144  ???  ondansetron (ZOFRAN) injection 4 mg, 4 mg, IntraVENous, Q6H PRN, Earlie Lou, Muhamed, MD, 4 mg at 07/23/18 1453  ???  sodium chloride (NS) flush 5-10 mL, 5-10 mL, IntraVENous, Q8H, Jasarevic, Muhamed, MD, 10 mL at 07/24/18 0643  ???  sodium chloride (NS) flush 5-10 mL, 5-10 mL, IntraVENous, PRN, Jasarevic, Muhamed, MD, 10 mL at 07/23/18 0857  ???  naloxone (NARCAN) injection 0.1 mg, 0.1 mg, IntraVENous, PRN, Irving Burton, MD  ???  albuterol (PROVENTIL VENTOLIN) nebulizer solution 2.5 mg, 2.5 mg, Nebulization, Q4H PRN, Earlie LouJasarevic, Muhamed, MD  ???  piperacillin-tazobactam (ZOSYN) 3.375 g IVPB (premix), 3.375 g, IntraVENous, Q8H, Jasarevic, Muhamed, MD, Stopped at 07/24/18 0811      Lab:     Lab Data Reviewed: (see below)  Recent Results (from the past 24 hour(s))   CBC WITH AUTOMATED DIFF    Collection Time: 07/24/18  6:50 AM   Result Value Ref Range    WBC 3.7 (L) 4.0 - 11.0 1000/mm3    RBC 4.84 3.80 - 5.70 M/uL    HGB 13.9 12.4 - 17.2 gm/dl    HCT 16.141.0 09.637.0 - 04.550.0 %    MCV 84.7 80.0 - 98.0 fL    MCH 28.7 23.0 - 34.6 pg    MCHC 33.9 30.0 - 36.0 gm/dl    PLATELET 409129 (L) 811140 - 450 1000/mm3    MPV 12.9 (H) 6.0 - 10.0 fL    RDW-SD 40.5 35.1 - 43.9      NRBC 0 0 - 0      IMMATURE GRANULOCYTES 0.3 0.0 - 3.0 %    NEUTROPHILS 45.6 34 - 64 %    LYMPHOCYTES 24.9 (L) 28 - 48 %    MONOCYTES 23.0 (H) 1 - 13 %    EOSINOPHILS 5.7 (H) 0 - 5 %    BASOPHILS 0.5 0 - 3 %   MAGNESIUM    Collection Time: 07/24/18  6:50 AM   Result Value Ref Range    Magnesium 2.4 1.6 - 2.6 mg/dl   METABOLIC PANEL, BASIC    Collection Time: 07/24/18  6:50 AM   Result Value Ref Range    Sodium 142 136 - 145 mEq/L    Potassium 3.1  (L) 3.5 - 5.1 mEq/L    Chloride 114 (H) 98 - 107 mEq/L    CO2 24 21 - 32 mEq/L    Glucose 80 74 - 106 mg/dl    BUN 15 7 - 25 mg/dl    Creatinine 1.2 0.6 - 1.3 mg/dl    GFR est AA >91>60      GFR est non-AA >60      Calcium 8.1 (L) 8.5 - 10.1 mg/dl    Anion gap 4 (L) 5 - 15 mmol/L       No results found.    Active Problems:    AKI (acute kidney injury) (HCC) (07/22/2018)      ____________________________________________________________________      Attending Physician: Theodis AguasYared G Liora Myles, MD

## 2018-07-24 NOTE — Progress Notes (Signed)
Problem: Patient Education:  Go to Education Activity  Goal: Patient/Family Education  Outcome: Progressing Towards Goal     Problem: Risk for Spread of Infection  Goal: Prevent transmission of infectious organism to others  Description: Prevent the transmission of infectious organisms to other patients, staff members, and visitors.  Outcome: Progressing Towards Goal     Problem: Falls - Risk of  Goal: *Absence of Falls  Description: Document Travis [name] Fall Risk and appropriate interventions in the flowsheet.  Outcome: Progressing Towards Goal  Note: Fall Risk Interventions:            Medication Interventions: Patient to call before getting OOB         History of Falls Interventions: Bed/chair exit alarm

## 2018-07-25 LAB — CBC WITH AUTO DIFFERENTIAL
Basophils %: 0.3 % (ref 0–3)
Eosinophils %: 7.5 % — ABNORMAL HIGH (ref 0–5)
Hematocrit: 38.4 % (ref 37.0–50.0)
Hemoglobin: 12.8 gm/dl (ref 12.4–17.2)
Immature Granulocytes: 0.6 % (ref 0.0–3.0)
Lymphocytes %: 33.8 % (ref 28–48)
MCH: 28.4 pg (ref 23.0–34.6)
MCHC: 33.3 gm/dl (ref 30.0–36.0)
MCV: 85.1 fL (ref 80.0–98.0)
MPV: 13.2 fL — ABNORMAL HIGH (ref 6.0–10.0)
Monocytes %: 22.8 % — ABNORMAL HIGH (ref 1–13)
Neutrophils %: 35 % (ref 34–64)
Nucleated RBCs: 0 (ref 0–0)
Platelets: 122 10*3/uL — ABNORMAL LOW (ref 140–450)
RBC: 4.51 M/uL (ref 3.80–5.70)
RDW-SD: 41 (ref 35.1–43.9)
WBC: 3.2 10*3/uL — ABNORMAL LOW (ref 4.0–11.0)

## 2018-07-25 LAB — BASIC METABOLIC PANEL
Anion Gap: 2 mmol/L — ABNORMAL LOW (ref 5–15)
BUN: 10 mg/dl (ref 7–25)
CO2: 25 mEq/L (ref 21–32)
Calcium: 8.1 mg/dl — ABNORMAL LOW (ref 8.5–10.1)
Chloride: 114 mEq/L — ABNORMAL HIGH (ref 98–107)
Creatinine: 0.9 mg/dl (ref 0.6–1.3)
EGFR IF NonAfrican American: >60
GFR African American: >60
Glucose: 90 mg/dl (ref 74–106)
Potassium: 3.3 mEq/L — ABNORMAL LOW (ref 3.5–5.1)
Sodium: 142 mEq/L (ref 136–145)

## 2018-07-25 LAB — MAGNESIUM
Magnesium: 2.1 mg/dl (ref 1.6–2.6)
Magnesium: 2.1 mg/dl (ref 1.6–2.6)

## 2018-07-25 LAB — CBC WITH AUTOMATED DIFF
BASOPHILS: 0.3 % (ref 0–3)
EOSINOPHILS: 7.5 % — ABNORMAL HIGH (ref 0–5)
HCT: 38.4 % (ref 37.0–50.0)
HGB: 12.8 gm/dl (ref 12.4–17.2)
IMMATURE GRANULOCYTES: 0.6 % (ref 0.0–3.0)
LYMPHOCYTES: 33.8 % (ref 28–48)
MCH: 28.4 pg (ref 23.0–34.6)
MCHC: 33.3 gm/dl (ref 30.0–36.0)
MCV: 85.1 fL (ref 80.0–98.0)
MONOCYTES: 22.8 % — ABNORMAL HIGH (ref 1–13)
MPV: 13.2 fL — ABNORMAL HIGH (ref 6.0–10.0)
NEUTROPHILS: 35 % (ref 34–64)
NRBC: 0 (ref 0–0)
PLATELET: 122 10*3/uL — ABNORMAL LOW (ref 140–450)
RBC: 4.51 M/uL (ref 3.80–5.70)
RDW-SD: 41 (ref 35.1–43.9)
WBC: 3.2 10*3/uL — ABNORMAL LOW (ref 4.0–11.0)

## 2018-07-25 LAB — METABOLIC PANEL, BASIC
Anion gap: 2 mmol/L — ABNORMAL LOW (ref 5–15)
BUN: 10 mg/dl (ref 7–25)
CO2: 25 mEq/L (ref 21–32)
Calcium: 8.1 mg/dl — ABNORMAL LOW (ref 8.5–10.1)
Chloride: 114 mEq/L — ABNORMAL HIGH (ref 98–107)
Creatinine: 0.9 mg/dl (ref 0.6–1.3)
GFR est AA: >60
GFR est non-AA: >60
Glucose: 90 mg/dl (ref 74–106)
Potassium: 3.3 mEq/L — ABNORMAL LOW (ref 3.5–5.1)
Sodium: 142 mEq/L (ref 136–145)

## 2018-07-25 MED ORDER — D5-NS WITH POTASSIUM CHLORIDE 20 MEQ/L IV
20 mEq/L | INTRAVENOUS | Status: DC
Start: 2018-07-25 — End: 2018-07-26
  Administered 2018-07-26 (×3): via INTRAVENOUS

## 2018-07-25 MED ORDER — LOPERAMIDE 2 MG CAP
2 mg | ORAL | Status: DC | PRN
Start: 2018-07-25 — End: 2018-07-31
  Administered 2018-07-26 – 2018-07-29 (×10): via ORAL

## 2018-07-25 MED FILL — GAS-X 80 MG CHEWABLE TABLET: 80 mg | ORAL | Qty: 1

## 2018-07-25 MED FILL — BD POSIFLUSH NORMAL SALINE 0.9 % INJECTION SYRINGE: INTRAMUSCULAR | Qty: 10

## 2018-07-25 MED FILL — ONDANSETRON (PF) 4 MG/2 ML INJECTION: 4 mg/2 mL | INTRAMUSCULAR | Qty: 2

## 2018-07-25 MED FILL — OXYCODONE-ACETAMINOPHEN 5 MG-325 MG TAB: 5-325 mg | ORAL | Qty: 1

## 2018-07-25 MED FILL — ZOSYN 3.375 GRAM/50 ML IN DEXTROSE (ISO-OSMOTIC) INTRAVENOUS PIGGYBACK: 3.375 gram/50 mL | INTRAVENOUS | Qty: 50

## 2018-07-25 NOTE — Progress Notes (Signed)
Problem: Falls - Risk of  Goal: *Absence of Falls  Description: Document Schmid Fall Risk and appropriate interventions in the flowsheet.  Outcome: Progressing Towards Goal  Note: Fall Risk Interventions:            Medication Interventions: Evaluate medications/consider consulting pharmacy         History of Falls Interventions: Investigate reason for fall

## 2018-07-25 NOTE — Other (Signed)
Bedside and Verbal shift change report given to Peter Raymond Ortiz Obaldo, RN   (oncoming nurse) by Anthony,Efuaga RN (offgoing nurse). Report included the following information SBAR and Kardex.

## 2018-07-25 NOTE — Progress Notes (Signed)
Hospitalist Progress Note    Patient: Travis [name] MRN: [id number]  CSN: [id number]    Date of Birth: 03/25/1990  Age: 28 y.o.  Sex: male    DOA: 07/21/2018 LOS:  LOS: 3 days          Chief Complaint:   Chief Complaint   Patient presents with   ??? Abdominal Pain   ??? Vomiting       Assessment/Plan     1. AKI  2. Enteritis.  BMP and magnesium, repeat urinalysis.  Full liquid diet.  3. Abdominal pain  4. HTN,   5. Diarrhea.  Imodium.  6. Asthma   7. Hx of Bell's palsy   8. Severe protein calorie malnutrition  9. Hypokalemia  10. Mild thrombocytopenia.    Patient Active Problem List   Diagnosis Code   ??? AKI (acute kidney injury) (HCC) N17.9       Subjective: 28 year old male patient with a history of abdominal pain nausea and diarrhea.  The patient seemed to initially improve however today on rounds patient notes that he ate a little bit and had some ginger ale then vomited again.  He continues to have diarrhea that has not improved since admission.  Initial studies are unremarkable.        Review of systems:   []  Unable to obtain  ROS due to  [] mental status change  [] sedated   [] intubated    Constitutional: No fever, chills, or weight loss  Eyes: No visual symptoms.  ENT: No sore throat, runny nose or ear pain.  Respiratory: No cough, dyspnea or wheezing.  Cardiovascular: No chest pain, pressure, palpitations, tightness or heaviness.  Gastrointestinal: Positive vomiting, positive diarrhea diminished abdominal pain.  Genitourinary: No dysuria, frequency, or urgency.  Musculoskeletal: No joint pain or swelling.  Integumentary: No rashes.  Neurological: No headaches, sensory or motor symptoms.  Psychologically: Patient presented with a dysphoric mood.    Vital signs/Intake and Output:  Visit Vitals  BP 116/75 (BP 1 Location: Right arm, BP Patient Position: Supine)   Pulse 63   Temp 98.2 ??F (36.8 ??C)   Resp 16   Ht 5\' 9"  (1.753 m)   Wt 70.4 kg (155 lb 1.6 oz)   SpO2 100%   BMI 22.90 kg/m??      Current Shift:  No intake/output data recorded.  Last three shifts:  05/29 0701 - 05/30 1900  In: 3200 [P.O.:840; I.V.:2360]  Out: 400     Exam:    General:  Alert, cooperative, no distress, appears stated age.   Head:  Normocephalic, without obvious abnormality, atraumatic.   Eyes:  Conjunctivae/corneas clear. PERRL, EOMs intact.        Nose: Nares normal. Septum midline. Mucosa normal. No drainage or sinus tenderness.   Throat: Lips, mucosa, and tongue normal. Teeth and gums normal.   Neck: Supple, symmetrical, trachea midline, no adenopathy, thyroid: no enlargement/tenderness/nodules, no carotid bruit and no JVD.   Back:   Symmetric, no curvature. ROM normal. No CVA tenderness.   Lungs:   Clear to auscultation bilaterally.   Chest wall:  No tenderness or deformity.   Heart:  Regular rate and rhythm, S1, S2 normal, no murmur, click, rub or gallop.   Abdomen:   Soft, non-tender. Bowel sounds hyperactive. No masses,  No organomegaly.           Extremities: Extremities normal, atraumatic, no cyanosis or edema.   Pulses: 2+ and symmetric all extremities.   Skin: Skin color, texture, turgor  normal. No rashes or lesions   Lymph nodes: Cervical, supraclavicular, and axillary nodes normal.   Neurologic: CNII-XII intact. Normal strength, sensation throughout.             Labs:    Sodium   Date Value Ref Range Status   07/25/2018 142 136 - 145 mEq/L Final     Potassium   Date Value Ref Range Status   07/25/2018 3.3 (L) 3.5 - 5.1 mEq/L Final     BUN   Date Value Ref Range Status   07/25/2018 10 7 - 25 mg/dl Final     Calcium   Date Value Ref Range Status   07/25/2018 8.1 (L) 8.5 - 10.1 mg/dl Final     Chloride   Date Value Ref Range Status   07/25/2018 114 (H) 98 - 107 mEq/L Final     CO2   Date Value Ref Range Status   07/25/2018 25 21 - 32 mEq/L Final     Glucose   Date Value Ref Range Status   07/25/2018 90 74 - 106 mg/dl Final     HGB   Date Value Ref Range Status   07/25/2018 12.8 12.4 - 17.2 gm/dl Final     HCT    Date Value Ref Range Status   07/25/2018 38.4 37.0 - 50.0 % Final     WBC   Date Value Ref Range Status   07/25/2018 3.2 (L) 4.0 - 11.0 1000/mm3 Final     PLATELET   Date Value Ref Range Status   07/25/2018 122 (L) 140 - 450 1000/mm3 Final         Procedures/imaging: see electronic medical records for all procedures/Xrays and details which were not copied into this note but were reviewed prior to creation of Plan      Corene Cornea, MD  Jul 25, 2018

## 2018-07-25 NOTE — Progress Notes (Signed)
Problem: Falls - Risk of  Goal: *Absence of Falls  Description: Document Travis [name] Fall Risk and appropriate interventions in the flowsheet.  Outcome: Progressing Towards Goal  Note: Fall Risk Interventions:            Medication Interventions: Evaluate medications/consider consulting pharmacy         History of Falls Interventions: Investigate reason for fall

## 2018-07-25 NOTE — Progress Notes (Signed)
Hospitalist Progress Note    Patient: Travis [name] MRN: [id number]  CSN: [id number]    Date of Birth: 10/06/1990  Age: 28 y.o.  Sex: male    DOA: 07/21/2018 LOS:  LOS: 3 days          Chief Complaint:   Chief Complaint   Patient presents with   ??? Abdominal Pain   ??? Vomiting       Assessment/Plan     1. AKI  2. Enteritis.  BMP and magnesium, repeat urinalysis.  Full liquid diet.  3. Abdominal pain  4. HTN,   5. Diarrhea.  Imodium.  6. Asthma   7. Hx of Bell's palsy   8. Severe protein calorie malnutrition  9. Hypokalemia  10. Mild thrombocytopenia.    Patient Active Problem List   Diagnosis Code   ??? AKI (acute kidney injury) (HCC) N17.9       Subjective: 28 year old male patient with a history of abdominal pain nausea and diarrhea.  The patient seemed to initially improve however today on rounds patient notes that he ate a little bit and had some ginger ale then vomited again.  He continues to have diarrhea that has not improved since admission.  Initial studies are unremarkable.        Review of systems:   []  Unable to obtain  ROS due to  [] mental status change  [] sedated   [] intubated    Constitutional: No fever, chills, or weight loss  Eyes: No visual symptoms.  ENT: No sore throat, runny nose or ear pain.  Respiratory: No cough, dyspnea or wheezing.  Cardiovascular: No chest pain, pressure, palpitations, tightness or heaviness.  Gastrointestinal: Positive vomiting, positive diarrhea diminished abdominal pain.  Genitourinary: No dysuria, frequency, or urgency.  Musculoskeletal: No joint pain or swelling.  Integumentary: No rashes.  Neurological: No headaches, sensory or motor symptoms.  Psychologically: Patient presented with a dysphoric mood.    Vital signs/Intake and Output:  Visit Vitals  BP 116/75 (BP 1 Location: Right arm, BP Patient Position: Supine)   Pulse 63   Temp 98.2 ??F (36.8 ??C)   Resp 16   Ht 5\' 9"  (1.753 m)   Wt 70.4 kg (155 lb 1.6 oz)   SpO2 100%   BMI 22.90 kg/m??     Current Shift:  No intake/output  data recorded.  Last three shifts:  05/29 0701 - 05/30 1900  In: 3200 [P.O.:840; I.V.:2360]  Out: 400     Exam:    General:  Alert, cooperative, no distress, appears stated age.   Head:  Normocephalic, without obvious abnormality, atraumatic.   Eyes:  Conjunctivae/corneas clear. PERRL, EOMs intact.        Nose: Nares normal. Septum midline. Mucosa normal. No drainage or sinus tenderness.   Throat: Lips, mucosa, and tongue normal. Teeth and gums normal.   Neck: Supple, symmetrical, trachea midline, no adenopathy, thyroid: no enlargement/tenderness/nodules, no carotid bruit and no JVD.   Back:   Symmetric, no curvature. ROM normal. No CVA tenderness.   Lungs:   Clear to auscultation bilaterally.   Chest wall:  No tenderness or deformity.   Heart:  Regular rate and rhythm, S1, S2 normal, no murmur, click, rub or gallop.   Abdomen:   Soft, non-tender. Bowel sounds hyperactive. No masses,  No organomegaly.           Extremities: Extremities normal, atraumatic, no cyanosis or edema.   Pulses: 2+ and symmetric all extremities.   Skin: Skin color, texture, turgor  normal. No rashes or lesions   Lymph nodes: Cervical, supraclavicular, and axillary nodes normal.   Neurologic: CNII-XII intact. Normal strength, sensation throughout.             Labs:    Sodium   Date Value Ref Range Status   07/25/2018 142 136 - 145 mEq/L Final     Potassium   Date Value Ref Range Status   07/25/2018 3.3 (L) 3.5 - 5.1 mEq/L Final     BUN   Date Value Ref Range Status   07/25/2018 10 7 - 25 mg/dl Final     Calcium   Date Value Ref Range Status   07/25/2018 8.1 (L) 8.5 - 10.1 mg/dl Final     Chloride   Date Value Ref Range Status   07/25/2018 114 (H) 98 - 107 mEq/L Final     CO2   Date Value Ref Range Status   07/25/2018 25 21 - 32 mEq/L Final     Glucose   Date Value Ref Range Status   07/25/2018 90 74 - 106 mg/dl Final     HGB   Date Value Ref Range Status   07/25/2018 12.8 12.4 - 17.2 gm/dl Final     HCT   Date Value Ref Range Status    07/25/2018 38.4 37.0 - 50.0 % Final     WBC   Date Value Ref Range Status   07/25/2018 3.2 (L) 4.0 - 11.0 1000/mm3 Final     PLATELET   Date Value Ref Range Status   07/25/2018 122 (L) 140 - 450 1000/mm3 Final         Procedures/imaging: see electronic medical records for all procedures/Xrays and details which were not copied into this note but were reviewed prior to creation of Plan      Corene Cornea, MD  Jul 25, 2018

## 2018-07-26 LAB — BASIC METABOLIC PANEL
Anion Gap: 4 mmol/L — ABNORMAL LOW (ref 5–15)
BUN: 5 mg/dl — ABNORMAL LOW (ref 7–25)
CO2: 23 mEq/L (ref 21–32)
Calcium: 8 mg/dl — ABNORMAL LOW (ref 8.5–10.1)
Chloride: 116 mEq/L — ABNORMAL HIGH (ref 98–107)
Creatinine: 0.8 mg/dl (ref 0.6–1.3)
EGFR IF NonAfrican American: >60
GFR African American: >60
Glucose: 118 mg/dl — ABNORMAL HIGH (ref 74–106)
Potassium: 3.4 mEq/L — ABNORMAL LOW (ref 3.5–5.1)
Sodium: 143 mEq/L (ref 136–145)

## 2018-07-26 LAB — MAGNESIUM
Magnesium: 1.9 mg/dl (ref 1.6–2.6)
Magnesium: 1.9 mg/dl (ref 1.6–2.6)

## 2018-07-26 LAB — METABOLIC PANEL, BASIC
Anion gap: 4 mmol/L — ABNORMAL LOW (ref 5–15)
BUN: 5 mg/dl — ABNORMAL LOW (ref 7–25)
CO2: 23 mEq/L (ref 21–32)
Calcium: 8 mg/dl — ABNORMAL LOW (ref 8.5–10.1)
Chloride: 116 mEq/L — ABNORMAL HIGH (ref 98–107)
Creatinine: 0.8 mg/dl (ref 0.6–1.3)
GFR est AA: >60
GFR est non-AA: >60
Glucose: 118 mg/dl — ABNORMAL HIGH (ref 74–106)
Potassium: 3.4 mEq/L — ABNORMAL LOW (ref 3.5–5.1)
Sodium: 143 mEq/L (ref 136–145)

## 2018-07-26 MED ORDER — D5-1/2 NS & POTASSIUM CHLORIDE 20 MEQ/L IV
20 mEq/L | INTRAVENOUS | Status: DC
Start: 2018-07-26 — End: 2018-07-31
  Administered 2018-07-27 – 2018-07-30 (×10): via INTRAVENOUS

## 2018-07-26 MED FILL — OXYCODONE-ACETAMINOPHEN 5 MG-325 MG TAB: 5-325 mg | ORAL | Qty: 1

## 2018-07-26 MED FILL — LOPERAMIDE 2 MG CAP: 2 mg | ORAL | Qty: 1

## 2018-07-26 MED FILL — ONDANSETRON (PF) 4 MG/2 ML INJECTION: 4 mg/2 mL | INTRAMUSCULAR | Qty: 2

## 2018-07-26 NOTE — Progress Notes (Signed)
Hospitalist Progress Note    Patient: Danise EdgeWalter Morrill IV MRN: [id number]  CSN: [id number]    Date of Birth: 09/25/1990  Age: 28 y.o.  Sex: male    DOA: 07/21/2018 LOS:  LOS: 4 days          Chief Complaint:   Chief Complaint   Patient presents with   ??? Abdominal Pain   ??? Vomiting       Assessment/Plan     1. AKI  2. Enteritis.  BMP and magnesium, repeat urinalysis.  Full liquid diet.  Change back to clears.  IV solution changed due to hyperchloremia to half-normal saline.  GI consult called  3. Abdominal pain.  Seems to be in the low abdomen and somewhat centrally likely involving the small bowel.  4. HTN,   5. Diarrhea.  Imodium.  Okay to discontinue this.  6. Asthma no current exacerbation.  7. Hx of Bell's palsy   8. Severe protein calorie malnutrition  9. Hypokalemia.  Improved  10. Mild thrombocytopenia.    Patient Active Problem List   Diagnosis Code   ??? AKI (acute kidney injury) (HCC) N17.9       Subjective: Patient relates that he really is no better at the present time and after the last visit that late afternoon he had more episodes of cramping then diarrhea along with urination so much so that he noted a little bit of blood with his bowel movement.        Review of systems:   []  Unable to obtain  ROS due to  [] mental status change  [] sedated   [] intubated    Constitutional: No fever, chills, or weight loss  Eyes: No visual symptoms.  ENT: No sore throat, runny nose or ear pain.  Respiratory: No cough, dyspnea or wheezing.  Cardiovascular: No chest pain, pressure, palpitations, tightness or heaviness.  Gastrointestinal: No vomiting, positive diarrhea positive abdominal pain.  Genitourinary: No dysuria, frequency, or urgency.  Musculoskeletal: No joint pain or swelling.  Integumentary: No rashes.  Neurological: No headaches, sensory or motor symptoms.    Vital signs/Intake and Output:  Visit Vitals  BP 130/83 (BP 1 Location: Left arm, BP Patient Position: Supine)   Pulse 69   Temp 98 ??F (36.7 ??C)   Resp 20    Ht 5\' 9"  (1.753 m)   Wt 70.4 kg (155 lb 1.6 oz)   SpO2 100%   BMI 22.90 kg/m??     Current Shift:  No intake/output data recorded.  Last three shifts:  05/29 1901 - 05/31 0700  In: 3024.6 [P.O.:240; I.V.:2784.6]  Out: 400     Exam:    General:  Alert, cooperative, no acute distress, appears stated age.   Head:  Normocephalic, without obvious abnormality, atraumatic.   Eyes:  Conjunctivae/corneas clear. PERRL, EOMs intact.               Neck: Supple, symmetrical, trachea midline, no adenopathy, thyroid: no enlargement/tenderness/nodules, no carotid bruit and no JVD.   Back:   Symmetric, no curvature. ROM normal. No CVA tenderness.   Lungs:   Clear to auscultation bilaterally.   Chest wall:  No tenderness or deformity.   Heart:  Regular rate and rhythm, S1, S2 normal, no murmur, click, rub or gallop.   Abdomen:   Soft, tender to palpation in the suprapubic region. Bowel sounds hyperactive. No masses,  No organomegaly.           Extremities: Extremities normal, atraumatic, no cyanosis or edema.  Skin: Skin color, texture, turgor normal. No rashes or lesions   Lymph nodes: Cervical, supraclavicular, and axillary nodes normal.   Neurologic: CNII-XII intact. Normal strength, sensation and reflexes throughout.             Labs: Labs reviewed and appear stable with increase in the potassium and renal function improving.    Sodium   Date Value Ref Range Status   07/26/2018 143 136 - 145 mEq/L Final     Potassium   Date Value Ref Range Status   07/26/2018 3.4 (L) 3.5 - 5.1 mEq/L Final     BUN   Date Value Ref Range Status   07/26/2018 5 (L) 7 - 25 mg/dl Final     Calcium   Date Value Ref Range Status   07/26/2018 8.0 (L) 8.5 - 10.1 mg/dl Final     Chloride   Date Value Ref Range Status   07/26/2018 116 (H) 98 - 107 mEq/L Final     CO2   Date Value Ref Range Status   07/26/2018 23 21 - 32 mEq/L Final     Glucose   Date Value Ref Range Status   07/26/2018 118 (H) 74 - 106 mg/dl Final     HGB   Date Value Ref Range Status    07/25/2018 12.8 12.4 - 17.2 gm/dl Final     HCT   Date Value Ref Range Status   07/25/2018 38.4 37.0 - 50.0 % Final     WBC   Date Value Ref Range Status   07/25/2018 3.2 (L) 4.0 - 11.0 1000/mm3 Final     PLATELET   Date Value Ref Range Status   07/25/2018 122 (L) 140 - 450 1000/mm3 Final         Procedures/imaging: see electronic medical records for all procedures/Xrays and details which were not copied into this note but were reviewed prior to creation of Plan      Corene Cornea, MD  Jul 26, 2018

## 2018-07-26 NOTE — Other (Signed)
Bedside shift change report given to Amy L Runge   (oncoming nurse) by kanika (offgoing nurse). Report included the following information SBAR.

## 2018-07-26 NOTE — Progress Notes (Signed)
Problem: Risk for Spread of Infection  Goal: Prevent transmission of infectious organism to others  Description: Prevent the transmission of infectious organisms to other patients, staff members, and visitors.  Outcome: Progressing Towards Goal     Problem: Patient Education:  Go to Education Activity  Goal: Patient/Family Education  Outcome: Progressing Towards Goal     Problem: Falls - Risk of  Goal: *Absence of Falls  Description: Document Bridgette Habermann Fall Risk and appropriate interventions in the flowsheet.  Outcome: Progressing Towards Goal  Note: Fall Risk Interventions:            Medication Interventions: Patient to call before getting OOB, Teach patient to arise slowly         History of Falls Interventions: Investigate reason for fall         Problem: Patient Education: Go to Patient Education Activity  Goal: Patient/Family Education  Outcome: Progressing Towards Goal

## 2018-07-26 NOTE — Other (Signed)
Bedside shift change report given to Kanika RN (oncoming nurse) by Peter RN (offgoing nurse). Report included the following information SBAR, Kardex, Intake/Output, MAR, Recent Results and Med Rec Status.

## 2018-07-26 NOTE — Progress Notes (Signed)
Problem: Risk for Spread of Infection  Goal: Prevent transmission of infectious organism to others  Description: Prevent the transmission of infectious organisms to other patients, staff members, and visitors.  Outcome: Progressing Towards Goal     Problem: Patient Education:  Go to Education Activity  Goal: Patient/Family Education  Outcome: Progressing Towards Goal     Problem: Falls - Risk of  Goal: *Absence of Falls  Description: Document Travis [name] Fall Risk and appropriate interventions in the flowsheet.  Outcome: Progressing Towards Goal  Note: Fall Risk Interventions:            Medication Interventions: Patient to call before getting OOB, Teach patient to arise slowly         History of Falls Interventions: Investigate reason for fall         Problem: Patient Education: Go to Patient Education Activity  Goal: Patient/Family Education  Outcome: Progressing Towards Goal

## 2018-07-26 NOTE — Progress Notes (Signed)
Hospitalist Progress Note    Patient: Travis [name] MRN: [id number]  CSN: [id number]    Date of Birth: 10/24/1990  Age: 28 y.o.  Sex: male    DOA: 07/21/2018 LOS:  LOS: 4 days          Chief Complaint:   Chief Complaint   Patient presents with   ??? Abdominal Pain   ??? Vomiting       Assessment/Plan     1. AKI  2. Enteritis.  BMP and magnesium, repeat urinalysis.  Full liquid diet.  Change back to clears.  [name] solution changed due to hyperchloremia to half-normal saline.  GI consult called  3. Abdominal pain.  Seems to be in the low abdomen and somewhat centrally likely involving the small bowel.  4. HTN,   5. Diarrhea.  Imodium.  Okay to discontinue this.  6. Asthma no current exacerbation.  7. Hx of Bell's palsy   8. Severe protein calorie malnutrition  9. Hypokalemia.  Improved  10. Mild thrombocytopenia.    Patient Active Problem List   Diagnosis Code   ??? AKI (acute kidney injury) (HCC) N17.9       Subjective: Patient relates that he really is no better at the present time and after the last visit that late afternoon he had more episodes of cramping then diarrhea along with urination so much so that he noted a little bit of blood with his bowel movement.        Review of systems:   []  Unable to obtain  ROS due to  [] mental status change  [] sedated   [] intubated    Constitutional: No fever, chills, or weight loss  Eyes: No visual symptoms.  ENT: No sore throat, runny nose or ear pain.  Respiratory: No cough, dyspnea or wheezing.  Cardiovascular: No chest pain, pressure, palpitations, tightness or heaviness.  Gastrointestinal: No vomiting, positive diarrhea positive abdominal pain.  Genitourinary: No dysuria, frequency, or urgency.  Musculoskeletal: No joint pain or swelling.  Integumentary: No rashes.  Neurological: No headaches, sensory or motor symptoms.    Vital signs/Intake and Output:  Visit Vitals  BP 130/83 (BP 1 Location: Left arm, BP Patient Position: Supine)   Pulse 69   Temp 98 ??F (36.7 ??C)   Resp 20   Ht 5'  9" (1.753 m)   Wt 70.4 kg (155 lb 1.6 oz)   SpO2 100%   BMI 22.90 kg/m??     Current Shift:  No intake/output data recorded.  Last three shifts:  05/29 1901 - 05/31 0700  In: 3024.6 [P.O.:240; I.V.:2784.6]  Out: 400     Exam:    General:  Alert, cooperative, no acute distress, appears stated age.   Head:  Normocephalic, without obvious abnormality, atraumatic.   Eyes:  Conjunctivae/corneas clear. PERRL, EOMs intact.               Neck: Supple, symmetrical, trachea midline, no adenopathy, thyroid: no enlargement/tenderness/nodules, no carotid bruit and no JVD.   Back:   Symmetric, no curvature. ROM normal. No CVA tenderness.   Lungs:   Clear to auscultation bilaterally.   Chest wall:  No tenderness or deformity.   Heart:  Regular rate and rhythm, S1, S2 normal, no murmur, click, rub or gallop.   Abdomen:   Soft, tender to palpation in the suprapubic region. Bowel sounds hyperactive. No masses,  No organomegaly.           Extremities: Extremities normal, atraumatic, no cyanosis or edema.  Skin: Skin color, texture, turgor normal. No rashes or lesions   Lymph nodes: Cervical, supraclavicular, and axillary nodes normal.   Neurologic: CNII-XII intact. Normal strength, sensation and reflexes throughout.             Labs: Labs reviewed and appear stable with increase in the potassium and renal function improving.    Sodium   Date Value Ref Range Status   07/26/2018 143 136 - 145 mEq/L Final     Potassium   Date Value Ref Range Status   07/26/2018 3.4 (L) 3.5 - 5.1 mEq/L Final     BUN   Date Value Ref Range Status   07/26/2018 5 (L) 7 - 25 mg/dl Final     Calcium   Date Value Ref Range Status   07/26/2018 8.0 (L) 8.5 - 10.1 mg/dl Final     Chloride   Date Value Ref Range Status   07/26/2018 116 (H) 98 - 107 mEq/L Final     CO2   Date Value Ref Range Status   07/26/2018 23 21 - 32 mEq/L Final     Glucose   Date Value Ref Range Status   07/26/2018 118 (H) 74 - 106 mg/dl Final     HGB   Date Value Ref Range Status    07/25/2018 12.8 12.4 - 17.2 gm/dl Final     HCT   Date Value Ref Range Status   07/25/2018 38.4 37.0 - 50.0 % Final     WBC   Date Value Ref Range Status   07/25/2018 3.2 (L) 4.0 - 11.0 1000/mm3 Final     PLATELET   Date Value Ref Range Status   07/25/2018 122 (L) 140 - 450 1000/mm3 Final         Procedures/imaging: see electronic medical records for all procedures/Xrays and details which were not copied into this note but were reviewed prior to creation of Plan      Corene Cornea, MD  Jul 26, 2018

## 2018-07-27 LAB — CBC WITH AUTO DIFFERENTIAL
Atypical Lymphocytes: 9 % — ABNORMAL HIGH (ref 0–0)
Basophils %: 1 % (ref 0–3)
Eosinophils %: 8 % — ABNORMAL HIGH (ref 0–5)
Hematocrit: 36 % — ABNORMAL LOW (ref 37.0–50.0)
Hemoglobin: 12 gm/dl — ABNORMAL LOW (ref 12.4–17.2)
Lymphocytes %: 23 % — ABNORMAL LOW (ref 28–48)
MCH: 28 pg (ref 23.0–34.6)
MCHC: 33.3 gm/dl (ref 30.0–36.0)
MCV: 84.1 fL (ref 80.0–98.0)
MPV: 12.7 fL — ABNORMAL HIGH (ref 6.0–10.0)
Monocytes %: 18 % — ABNORMAL HIGH (ref 1–13)
Neutrophils %: 43 % (ref 34–64)
Nucleated RBCs: 0 (ref 0–0)
Platelet Comment: NORMAL
Platelets: 115 10*3/uL — ABNORMAL LOW (ref 140–450)
RBC: 4.28 M/uL (ref 3.80–5.70)
RDW-SD: 39.7 (ref 35.1–43.9)
WBC: 2.8 10*3/uL — ABNORMAL LOW (ref 4.0–11.0)

## 2018-07-27 LAB — BASIC METABOLIC PANEL
Anion Gap: 3 mmol/L — ABNORMAL LOW (ref 5–15)
BUN: 2 mg/dl — ABNORMAL LOW (ref 7–25)
CO2: 22 mEq/L (ref 21–32)
Calcium: 7.9 mg/dl — ABNORMAL LOW (ref 8.5–10.1)
Chloride: 117 mEq/L — ABNORMAL HIGH (ref 98–107)
Creatinine: 0.6 mg/dl (ref 0.6–1.3)
EGFR IF NonAfrican American: >60
GFR African American: >60
Glucose: 97 mg/dl (ref 74–106)
Potassium: 3.6 mEq/L (ref 3.5–5.1)
Sodium: 141 mEq/L (ref 136–145)

## 2018-07-27 LAB — URINALYSIS W/ RFLX MICROSCOPIC
Bilirubin, Urine: NEGATIVE
Bilirubin: NEGATIVE
Blood, Urine: NEGATIVE
Blood: NEGATIVE
Glucose, Ur: NEGATIVE mg/dl
Glucose: NEGATIVE mg/dl
Ketone: NEGATIVE mg/dl
Ketones, Urine: NEGATIVE mg/dl
Leukocyte Esterase, Urine: NEGATIVE
Leukocyte Esterase: NEGATIVE
Nitrite, Urine: NEGATIVE
Nitrites: NEGATIVE
Protein, UA: NEGATIVE mg/dl
Protein: NEGATIVE mg/dl
Specific Gravity, UA: <=1.005 (ref 1.005–1.030)
Specific gravity: <=1.005 (ref 1.005–1.030)
Urobilinogen, UA, POCT: 0.2 mg/dl (ref 0.0–1.0)
Urobilinogen: 0.2 mg/dl (ref 0.0–1.0)
pH (UA): 6 (ref 5.0–9.0)
pH, UA: 6 (ref 5.0–9.0)

## 2018-07-27 LAB — HEPATIC FUNCTION PANEL
ALT (SGPT): 179 U/L — ABNORMAL HIGH (ref 12–78)
ALT: 179 U/L — ABNORMAL HIGH (ref 12–78)
AST (SGOT): 142 U/L — ABNORMAL HIGH (ref 15–37)
AST: 142 U/L — ABNORMAL HIGH (ref 15–37)
Albumin: 2.7 gm/dl — ABNORMAL LOW (ref 3.4–5.0)
Albumin: 2.7 gm/dl — ABNORMAL LOW (ref 3.4–5.0)
Alk. phosphatase: 48 U/L (ref 45–117)
Alkaline Phosphatase: 48 U/L (ref 45–117)
Bilirubin, Direct: 0.1 mg/dl (ref 0.0–0.2)
Bilirubin, direct: 0.1 mg/dl (ref 0.0–0.2)
Bilirubin, total: 0.2 mg/dl (ref 0.2–1.0)
Protein, total: 6.4 gm/dl (ref 6.4–8.2)
Total Bilirubin: 0.2 mg/dl (ref 0.2–1.0)
Total Protein: 6.4 gm/dl (ref 6.4–8.2)

## 2018-07-27 LAB — LIPASE
Lipase: 231 U/L (ref 73–393)
Lipase: 231 U/L (ref 73–393)

## 2018-07-27 LAB — CBC WITH AUTOMATED DIFF
ATYPICAL LYMPHS: 9 % — ABNORMAL HIGH (ref 0–0)
BASOPHILS: 1 % (ref 0–3)
EOSINOPHILS: 8 % — ABNORMAL HIGH (ref 0–5)
HCT: 36 % — ABNORMAL LOW (ref 37.0–50.0)
HGB: 12 gm/dl — ABNORMAL LOW (ref 12.4–17.2)
LYMPHOCYTES: 23 % — ABNORMAL LOW (ref 28–48)
MCH: 28 pg (ref 23.0–34.6)
MCHC: 33.3 gm/dl (ref 30.0–36.0)
MCV: 84.1 fL (ref 80.0–98.0)
MONOCYTES: 18 % — ABNORMAL HIGH (ref 1–13)
MPV: 12.7 fL — ABNORMAL HIGH (ref 6.0–10.0)
NEUTROPHILS: 43 % (ref 34–64)
NRBC: 0 (ref 0–0)
PLATELET COMMENTS: NORMAL
PLATELET: 115 10*3/uL — ABNORMAL LOW (ref 140–450)
RBC: 4.28 M/uL (ref 3.80–5.70)
RDW-SD: 39.7 (ref 35.1–43.9)
WBC: 2.8 10*3/uL — ABNORMAL LOW (ref 4.0–11.0)

## 2018-07-27 LAB — METABOLIC PANEL, BASIC
Anion gap: 3 mmol/L — ABNORMAL LOW (ref 5–15)
BUN: 2 mg/dl — ABNORMAL LOW (ref 7–25)
CO2: 22 mEq/L (ref 21–32)
Calcium: 7.9 mg/dl — ABNORMAL LOW (ref 8.5–10.1)
Chloride: 117 mEq/L — ABNORMAL HIGH (ref 98–107)
Creatinine: 0.6 mg/dl (ref 0.6–1.3)
GFR est AA: >60
GFR est non-AA: >60
Glucose: 97 mg/dl (ref 74–106)
Potassium: 3.6 mEq/L (ref 3.5–5.1)
Sodium: 141 mEq/L (ref 136–145)

## 2018-07-27 MED ORDER — DICYCLOMINE 10 MG CAP
10 mg | Freq: Four times a day (QID) | ORAL | Status: DC
Start: 2018-07-27 — End: 2018-07-31
  Administered 2018-07-27 – 2018-07-31 (×17): via ORAL

## 2018-07-27 MED ORDER — OMEPRAZOLE 20 MG CAP, DELAYED RELEASE
20 mg | Freq: Every day | ORAL | Status: DC
Start: 2018-07-27 — End: 2018-07-31
  Administered 2018-07-27 – 2018-07-31 (×5): via ORAL

## 2018-07-27 MED FILL — LOPERAMIDE 2 MG CAP: 2 mg | ORAL | Qty: 1

## 2018-07-27 MED FILL — DICYCLOMINE 10 MG CAP: 10 mg | ORAL | Qty: 1

## 2018-07-27 MED FILL — OMEPRAZOLE 20 MG CAP, DELAYED RELEASE: 20 mg | ORAL | Qty: 2

## 2018-07-27 MED FILL — OXYCODONE-ACETAMINOPHEN 5 MG-325 MG TAB: 5-325 mg | ORAL | Qty: 1

## 2018-07-27 NOTE — Other (Signed)
Bedside shift change report given to Amy L Runge   (oncoming nurse) by tabitha (offgoing nurse). Report included the following information SBAR.

## 2018-07-27 NOTE — Consults (Addendum)
A member of [facility]    GI Consult Note    IMPRESSION:     1. Nausea, vomiting abdominal pain.  CT with enteritis vs mild enterocolitis.  Suspect likely infectious enteritis.  Improving although still with nausea.  2. AKI. Improving.  3. Elevated transaminases  4. History of Bell's palsy    RECOMMENDATIONS:     1. Await stool studies  2. Continue Zofran PRN  3. Continue clear liquids and advance slowly as tolerated  4. Begin PPI daily  5. Recheck LFTs in AM    HPI:     28 y.o. male admitted on 07/21/2018 for AKI (acute kidney injury) (HCC) [N17.9].  Approximately a week age he began to experience nausea, vomiting, diarrhea, and diffuse crampy abdominal pain.  The symptoms began a few hours after eating sushi.  On presentation he was noted to have AKI with creat 4.0.  CT abd/pelvis 07/22/18 with findings c/w enteritis vs mild enterocolitis, normal appendix.  Initially he experienced diarrhea with watery stool up to 7-8x/day which has improved now.  His vomiting has also improved with his last episode of emesis yesterday.  He continues to experience nausea and some abdominal cramping.  He has never undergone colonoscopy previously.  He reports seeing a little blood on the toilet paper after wiping on time but otherwise denies melena, hematochezia.    Patient Active Problem List    Diagnosis Date Noted   ??? AKI (acute kidney injury) (HCC) 07/22/2018      Past Medical History:   Diagnosis Date   ??? Asthma    ??? Hypertension    ??? Neurological disorder     Bell's palsy     No Known Allergies    Prior to Admission Medications   Prescriptions Last Dose Informant Patient Reported? Taking?   albuterol (PROAIR HFA) 90 mcg/actuation inhaler 06/22/2018 at Unknown time  No Yes   Sig: Take 1 Puff by inhalation every four (4) hours as needed for Wheezing.      Facility-Administered Medications: None     Current Facility-Administered Medications    Medication Dose Route Frequency Provider Last Rate Last Dose   ??? dicyclomine (BENTYL) capsule 10 mg  10 mg Oral QID Holladay, Versie Starks, MD   10 mg at 07/27/18 1243   ??? omeprazole (PRILOSEC) capsule 40 mg  40 mg Oral ACB Holladay, Versie Starks, MD   40 mg at 07/27/18 1243   ??? dextrose 5% - 0.45% NaCl with KCl 20 mEq/L infusion  125 mL/hr IntraVENous CONTINUOUS Holladay, Versie Starks, MD 125 mL/hr at 07/27/18 0511 125 mL/hr at 07/27/18 0511   ??? loperamide (IMODIUM) capsule 2 mg  2 mg Oral Q4H PRN Corene Cornea, MD   2 mg at 07/27/18 1146   ??? oxyCODONE-acetaminophen (PERCOCET) 5-325 mg per tablet 1 Tab  1 Tab Oral Q4H PRN Theodis Aguas, MD   1 Tab at 07/27/18 1146   ??? simethicone (MYLICON) tablet 80 mg  80 mg Oral QID PRN Theodis Aguas, MD   80 mg at 07/25/18 1532   ??? ondansetron (ZOFRAN) injection 4 mg  4 mg IntraVENous Q6H PRN Jasarevic, Muhamed, MD   4 mg at 07/26/18 1511   ??? sodium chloride (NS) flush 5-10 mL  5-10 mL IntraVENous Q8H Holladay, Versie Starks, MD   10 mL at 07/27/18 1244   ??? sodium chloride (NS) flush 5-10 mL  5-10 mL IntraVENous PRN Jasarevic, Muhamed, MD   10 mL at  07/23/18 0857   ??? naloxone (NARCAN) injection 0.1 mg  0.1 mg IntraVENous PRN Jasarevic, Muhamed, MD       ??? albuterol (PROVENTIL VENTOLIN) nebulizer solution 2.5 mg  2.5 mg Nebulization Q4H PRN Irving Burton, MD         History reviewed. No pertinent surgical history.  History reviewed. No pertinent family history.  Social History     Socioeconomic History   ??? Marital status: SINGLE     Spouse name: Not on file   ??? Number of children: Not on file   ??? Years of education: Not on file   ??? Highest education level: Not on file   Occupational History   ??? Not on file   Social Needs   ??? Financial resource strain: Not on file   ??? Food insecurity     Worry: Not on file     Inability: Not on file   ??? Transportation needs     Medical: Not on file     Non-medical: Not on file   Tobacco Use   ??? Smoking status: Never Smoker    ??? Smokeless tobacco: Never Used   Substance and Sexual Activity   ??? Alcohol use: No   ??? Drug use: No   ??? Sexual activity: Yes     Partners: Female     Birth control/protection: Condom   Lifestyle   ??? Physical activity     Days per week: Not on file     Minutes per session: Not on file   ??? Stress: Not on file   Relationships   ??? Social Wellsite geologist on phone: Not on file     Gets together: Not on file     Attends religious service: Not on file     Active member of club or organization: Not on file     Attends meetings of clubs or organizations: Not on file     Relationship status: Not on file   ??? Intimate partner violence     Fear of current or ex partner: Not on file     Emotionally abused: Not on file     Physically abused: Not on file     Forced sexual activity: Not on file   Other Topics Concern   ??? Not on file   Social History Narrative   ??? Not on file       Review of Systems:  Positive for palpitations, light headedness, some weight loss over the past week.  He denies fevers, chills, sweats, headaches, lightheadedness, blurred vision, double vision, dysphagia, odynophagia, dysuria, burning with urination, numbness, tingling, weakness, easy bruising, easy bleeding, cough, rashes.    Objective:     Visit Vitals  BP 134/80 (BP 1 Location: Left arm, BP Patient Position: Supine)   Pulse 65   Temp 98.4 ??F (36.9 ??C)   Resp 16   Ht  (1.753 m)   Wt 70.4 kg (155 lb 3.3 oz)   SpO2 100%   BMI 22.92 kg/m??     Temp (24hrs), Avg:98.7 ??F (37.1 ??C), Min:98.4 ??F (36.9 ??C), Max:99.1 ??F (37.3 ??C)    Vitals:    07/27/18 0402 07/27/18 0749 07/27/18 1130 07/27/18 1535   BP: 132/80 132/88 (!) 144/99 134/80   Pulse: 66 62 76 65   Resp: Temp: 98.7 ??F (37.1 ??C) 98.8 ??F (37.1 ??C) 98.7 ??F (37.1 ??C) 98.4 ??F (36.9 ??C)   SpO2: 100% 100%  100% 100%   Weight:       Height:           Intake/Output Summary (Last 24 hours) at 07/27/2018 1614  Last data filed at 07/27/2018 0513  Gross per 24 hour   Intake 2743.75 ml    Output ???   Net 2743.75 ml       Physical Exam:  General: Lying comfortably, in no acute distress  HEENT: NC/AT, conjunctiva pink, nasopharynx without exudates  Neck: supple  Lungs: clear anteriorly  CV: RRR, +s1s2  Abd: soft, NT, ND, +BS  Ext: No cyanosis or edema  Neuro: Alert, moving all four extremeties      DATA    CBC w/Diff   Lab Results   Component Value Date/Time    WBC 2.8 (L) 07/27/2018 06:25 AM    WBC 3.2 (L) 07/25/2018 07:03 AM    WBC 3.7 (L) 07/24/2018 06:50 AM    RBC 4.28 07/27/2018 06:25 AM    RBC 4.51 07/25/2018 07:03 AM    RBC 4.84 07/24/2018 06:50 AM    HGB 12.0 (L) 07/27/2018 06:25 AM    HGB 12.8 07/25/2018 07:03 AM    HGB 13.9 07/24/2018 06:50 AM    HCT 36.0 (L) 07/27/2018 06:25 AM    HCT 38.4 07/25/2018 07:03 AM    HCT 41.0 07/24/2018 06:50 AM    MCV 84.1 07/27/2018 06:25 AM    MCV 85.1 07/25/2018 07:03 AM    MCV 84.7 07/24/2018 06:50 AM    MCH 28.0 07/27/2018 06:25 AM    MCH 28.4 07/25/2018 07:03 AM    MCH 28.7 07/24/2018 06:50 AM    MCHC 33.3 07/27/2018 06:25 AM    MCHC 33.3 07/25/2018 07:03 AM    MCHC 33.9 07/24/2018 06:50 AM    PLT 115 (L) 07/27/2018 06:25 AM    PLT 122 (L) 07/25/2018 07:03 AM    PLT 129 (L) 07/24/2018 06:50 AM     Lab Results   Component Value Date/Time    GRANS 43.0 07/27/2018 06:25 AM    GRANS 35.0 07/25/2018 07:03 AM    GRANS 45.6 07/24/2018 06:50 AM    MONOS 18.0 (H) 07/27/2018 06:25 AM    MONOS 22.8 (H) 07/25/2018 07:03 AM    MONOS 23.0 (H) 07/24/2018 06:50 AM    EOS 8.0 (H) 07/27/2018 06:25 AM    EOS 7.5 (H) 07/25/2018 07:03 AM    EOS 5.7 (H) 07/24/2018 06:50 AM    BASOS 1.0 07/27/2018 06:25 AM    BASOS 0.3 07/25/2018 07:03 AM    BASOS 0.5 07/24/2018 06:50 AM        Hepatic Function   Recent Labs     07/27/18  0625   ALT 179*   TBILI 0.2   CBIL 0.1   AP 48   ALB 2.7*   TP 6.4        Pancreatic Enzymes   Recent Labs     07/27/18  0625   LPSE 231        Basic Metabolic Profile   Recent Labs     07/27/18  0625 07/26/18  0529 07/25/18  0703   NA 141 143 142    K 3.6 3.4* 3.3*   CL 117* 116* 114*   CO2 22 23 25    BUN 2* 5* 10   GLU 97 118* 90   CREA 0.6 0.8 0.9   CA 7.9* 8.0* 8.1*   MG  --  1.9 2.1  POC Glucose  No components found for: [facility]    Coagulation   No results for input(s): PTP, INR, APTT, INREXT in the last 72 hours.       Stool FOB No results found for: OB1, OB2, OB3, OCBL, POCOBL, SOB, OCCBLDEXT    Radiology Results  Ct Abd Pelv Wo Cont    Result Date: 07/22/2018  Abdomen and Pelvis CT scan. Without intravenous contrast. Comparison:  None Indication: AKI, diarrhea, vomiting.  Initial quality [facility] interpretation. Technique: 5 mm axial images without  Isovue 370 nonionic intravenous contrast. Oral contrast not given. Findings: Visualized lungs: Clear Hepatobiliary: Normal Stomach: Normal Pancreas: Normal Spleen: Normal Adrenal glands: Normal Kidneys/ureters: Normal Peritoneum/retroperitoneum:  Normal Lymph nodes: Normal Bowel: Fluid-filled colonic loops. Mild wall thickening of small bowel. Findings consistent with enteritis versus mild enterocolitis. No obstruction. Normal appendix.Marland Kitchen  Pelvic organs/Bladder: Normal Bones and soft tissues: Normal Other: None     Impression: 1. Findings consistent with enteritis versus mild enterocolitis. 2. Normal appendix DICOM format image data is available to non-affiliated external healthcare facilities or entities on a secure, media free, reciprocally searchable basis with patient authorization for 12 months following the date of the study.         Larence Penning Zane Herald, M.D.  [facility]  [facility] [phone]  [place] Office [phone]

## 2018-07-27 NOTE — Progress Notes (Signed)
Medical Progress Note      NAME: Travis [name]   DOB:  [birth year]  MRM:  [id number]    Date/Time: 07/27/2018  12:07 PM         Subjective:     Pt still CO abd pain and still CO loose stools. He reports this all started during the day after eating sushi. He denies fever or chills. He denies melena or bloody stools but notes some blood on tissue paper with wiping. He vomitied yesterday. No vomit since but nausea persists.     Past Medical History reviewed and unchanged from Admission History and Physical    Review of Systems   Constitutional: Positive for fatigue.   HENT: Negative.    Eyes: Negative.    Respiratory: Negative.    Cardiovascular: Negative.    Gastrointestinal: Positive for abdominal pain, diarrhea and nausea. Negative for vomiting.   Endocrine: Negative.    Genitourinary: Negative.    Musculoskeletal: Negative.    Skin: Negative.    Neurological: Negative.    Hematological: Negative.    Psychiatric/Behavioral: Negative.             Objective:       Vitals:      Last 24hrs VS reviewed since prior progress note. Most recent are:    Visit Vitals  BP (!) 144/99 (BP 1 Location: Right arm, BP Patient Position: Supine)   Pulse 76   Temp 98.7 ??F (37.1 ??C)   Resp 16   Ht 5\' 9"  (1.753 m)   Wt 70.4 kg (155 lb 3.3 oz)   SpO2 100%   BMI 22.92 kg/m??     SpO2 Readings from Last 6 Encounters:   07/27/18 100%   04/10/18 99%   02/14/16 97%   01/27/16 99%   09/18/15 98%   06/05/15 99%            Intake/Output Summary (Last 24 hours) at 07/27/2018 1207  Last data filed at 07/27/2018 0513  Gross per 24 hour   Intake 2743.75 ml   Output ???   Net 2743.75 ml          Exam:      Physical Exam  Vitals signs and nursing note reviewed.   Constitutional:       Appearance: Normal appearance.   HENT:      Head: Normocephalic.      Nose: Nose normal.      Mouth/Throat:      Mouth: Mucous membranes are moist.   Eyes:      Extraocular Movements: Extraocular movements intact.      Pupils: Pupils are equal, round, and reactive to light.   Neck:       Musculoskeletal: Normal range of motion.   Cardiovascular:      Rate and Rhythm: Normal rate and regular rhythm.   Pulmonary:      Effort: Pulmonary effort is normal.      Breath sounds: Normal breath sounds.   Abdominal:      General: Bowel sounds are normal.      Palpations: Abdomen is soft.   Musculoskeletal: Normal range of motion.   Neurological:      General: No focal deficit present.      Mental Status: He is alert.           Lab Data Reviewed: (see below)      Medications:  Current Facility-Administered Medications   Medication Dose Route Frequency   ??? dextrose 5% - 0.45% NaCl  with KCl 20 mEq/L infusion  125 mL/hr IntraVENous CONTINUOUS   ??? loperamide (IMODIUM) capsule 2 mg  2 mg Oral Q4H PRN   ??? oxyCODONE-acetaminophen (PERCOCET) 5-325 mg per tablet 1 Tab  1 Tab Oral Q4H PRN   ??? simethicone (MYLICON) tablet 80 mg  80 mg Oral QID PRN   ??? ondansetron (ZOFRAN) injection 4 mg  4 mg IntraVENous Q6H PRN   ??? sodium chloride (NS) flush 5-10 mL  5-10 mL IntraVENous Q8H   ??? sodium chloride (NS) flush 5-10 mL  5-10 mL IntraVENous PRN   ??? naloxone (NARCAN) injection 0.1 mg  0.1 mg IntraVENous PRN   ??? albuterol (PROVENTIL VENTOLIN) nebulizer solution 2.5 mg  2.5 mg Nebulization Q4H PRN       ______________________________________________________________________      Lab Review:     Recent Labs     07/27/18  0625 07/25/18  0703   WBC 2.8* 3.2*   HGB 12.0* 12.8   HCT 36.0* 38.4   PLT 115* 122*     Recent Labs     07/27/18  0625 07/26/18  0529 07/25/18  0703   NA 141 143 142   K 3.6 3.4* 3.3*   CL 117* 116* 114*   CO2 22 23 25    GLU 97 118* 90   BUN 2* 5* 10   CREA 0.6 0.8 0.9   CA 7.9* 8.0* 8.1*   MG  --  1.9 2.1          Assessment:     Active Problems:    AKI (acute kidney injury) (HCC) (07/22/2018)           Plan:     Risk of deterioration: medium             1. Intractable nausea vomiting and abd pain. Continue symptom management. Evaluation and treatment in progress. GI consult pending   2. Acute renal failure due to pre-renal state due to vomiting. Improved. SP [name] fluids  3. Enteritis. Tolerating clear liquid diet currently  4. HTN controlled  5. Asthma no active issues  6. Hx Bell's palsy no active issues  7. Severe protein calorie malnutrition. Advance diet as able  8. Hypkalemia due to diarrhea improved  9. Mild thrombocytopenia no active issues.          Total time spent with patient: 30 Minutes                  Care Plan discussed with: Patient, Nursing Staff and >50% of time spent in counseling and coordination of care    Discussed:  Care Plan    Prophylaxis:  SCD's    Disposition:  Home w/Family           ___________________________________________________    Attending Physician: Marlene BastJeffrey G Dwayn Moravek, MD

## 2018-07-27 NOTE — Progress Notes (Addendum)
NUTRITION FOLLOW UP  ADULT MALNUTRITION GUIDELINES  ??  Patient is a 28 y.o. male, admitted on 07/21/2018 with a diagnosis of AKI (acute kidney injury) ([place]) [N17.9].  Nutrition assessment was completed by RD and the patient was found to meet the following malnutrition criteria established by ASPEN/AND:  ??  Adult Malnutrition Guidelines:  SEVERE PROTEIN CALORIE MALNUTRITION IN THE CONTEXT OF ACUTE INJURY/ILLNESS  Energy intake: <50% energy intake compared to estimated energy needs >5 days   Wt loss 17.9% within 2 months time  ??  Please document Severe  Protein Calorie Malnutrition in Problem List if in agreement  ??  Current diet order: DIET NUTRITIONAL SUPPLEMENTS Breakfast, Dinner; Ensure Clear  DIET CLEAR LIQUID     Nutrition recommendation:     Regular diet as tolerated when medically able  Rec Ensure Clear BID (provides 240 kcals, 8 g protein each) while pt on clear liquid diet    If unable to advance diet rec consider alternate means of nutrition of PPN or TPN    Current intake: _0  N/A- NPO    _1  very poor     _2  poor      _3  fair      _4  good  % Diet Eaten  Avg: 46 %  Min: 15 %  Max: 90 %    Pertinent Meds: dextrose, imodium, zofran    Pertinent Labs: 6/1 - glu 97, K 3.6, Na 141    Weight:   Last 3 Recorded Weights in this Encounter    07/21/18 2230 07/26/18 2031   Weight: 70.4 kg (155 lb 1.6 oz) 70.4 kg (155 lb 3.3 oz)       BMI: Body mass index is 22.92 kg/m??.    Physical assessment:  ?? Chewing/swallowing issues:   none  ?? GI symptoms:       Last Bowel Movement Date: 07/26/18   Stool Appearance: Loose   Abdominal Assessment: Soft   Bowel Sounds: Active   ?? Fluid retention:   none     ?? Skin integrity:   none       Nutrition diagnosis:   1. .Severe acute pcm related to Inadequate calorie and nutrient intake related to intentional severe wt loss as evidenced by diet recall of pt consuming 1 -2 meals daily totaling 1000 calories per day, < 50% intake >  5 days and wt loss of 17.9% within 2 months time. (continues)  ??            2.      Altered gi function related to enterits as evidenced by abdominal pain, diarrhea need for clear liquid diet. (continues)  ??  Assessment of Current MNT: Diet is appropriate for the time being but is inadequate to meet long-term needs. Adv diet when medically able    Monitoring and evaluation: MNT initiation and tolerance, wt, labs (BG, CMP), medical changes & treatment plan of care    Nutrition goals: Goal to meet 75% of nutrition needs or greater, dry wt stable through LOS, nutrition related lab values WNL, maintain skin integrity    Nutrition level of care:  _5  low     _6  moderate     _7  High    Progress towards nutrition goals: _8   Met/Ongoing     _9   Progressing appropriately       _10   Progressing slowly      _11   Not Progressing    Code status: Full Code    Discharge Placement: Home with family  assistance  Comfort Care: Yes    [place], RD  07/27/18

## 2018-07-27 NOTE — Progress Notes (Signed)
Problem: Risk for Spread of Infection  Goal: Prevent transmission of infectious organism to others  Description: Prevent the transmission of infectious organisms to other patients, staff members, and visitors.  Outcome: Progressing Towards Goal     Problem: Patient Education:  Go to Education Activity  Goal: Patient/Family Education  Outcome: Progressing Towards Goal     Problem: Falls - Risk of  Goal: *Absence of Falls  Description: Document Schmid Fall Risk and appropriate interventions in the flowsheet.  Outcome: Progressing Towards Goal  Note: Fall Risk Interventions:            Medication Interventions: Patient to call before getting OOB, Teach patient to arise slowly         History of Falls Interventions: Bed/chair exit alarm         Problem: Patient Education: Go to Patient Education Activity  Goal: Patient/Family Education  Outcome: Progressing Towards Goal

## 2018-07-27 NOTE — Progress Notes (Signed)
Problem: Falls - Risk of  Goal: *Absence of Falls  Description: Document Schmid Fall Risk and appropriate interventions in the flowsheet.  Outcome: Progressing Towards Goal  Note: Fall Risk Interventions:            Medication Interventions: Patient to call before getting OOB, Teach patient to arise slowly         History of Falls Interventions: Bed/chair exit alarm

## 2018-07-27 NOTE — Consults (Signed)
Consults by Jonna Coup, MD at 07/27/18 1614                Author: Jonna Coup, MD  Service: Gastroenterology  Author Type: Physician       Filed: 07/27/18 1627  Date of Service: 07/27/18 1614  Status: Addendum          Editor: Jonna Coup, MD (Physician)          Related Notes: Original Note by Jonna Coup, MD (Physician) filed at 07/27/18 1626                    A member of [facility]      GI Consult Note        IMPRESSION :        1. Nausea, vomiting abdominal pain.  CT with enteritis vs mild enterocolitis.  Suspect likely infectious enteritis.  Improving although still with nausea.   2. AKI. Improving.   3. Elevated transaminases   4. History of Bell's palsy        RECOMMENDATIONS:        1. Await stool studies   2. Continue Zofran PRN   3. Continue clear liquids and advance slowly as tolerated   4. Begin PPI daily   5. Recheck LFTs in AM        HPI:        28 y.o. male admitted on 07/21/2018 for AKI (acute kidney injury) (HCC) [N17.9].  Approximately a week age he began to experience nausea,  vomiting, diarrhea, and diffuse crampy abdominal pain.  The symptoms began a few hours after eating sushi.  On presentation he was noted to have AKI with creat 4.0.  CT abd/pelvis 07/22/18 with findings c/w enteritis vs mild enterocolitis, normal appendix.   Initially he experienced diarrhea with watery stool up to 7-8x/day which has improved now.  His vomiting has also improved with his last episode of emesis yesterday.  He continues to experience nausea and some abdominal cramping.  He has never undergone  colonoscopy previously.  He reports seeing a little blood on the toilet paper after wiping on time but otherwise denies melena, hematochezia.        Patient Active Problem List           Diagnosis  Date Noted         ?  AKI (acute kidney injury) (HCC)  07/22/2018           Past Medical History:        Diagnosis  Date         ?  Asthma       ?  Hypertension       ?   Neurological disorder            Bell's palsy        No Known Allergies        Prior to Admission Medications     Prescriptions  Last Dose  Informant  Patient Reported?  Taking?      albuterol (PROAIR HFA) 90 mcg/actuation inhaler  06/22/2018 at Unknown time    No  Yes      Sig: Take 1 Puff by inhalation every four (4) hours as needed for Wheezing.               Facility-Administered Medications: None          Current Facility-Administered Medications  Medication  Dose  Route  Frequency  Provider  Last Rate  Last Dose              ?  dicyclomine (BENTYL) capsule 10 mg   10 mg  Oral  QID  Holladay, Versie Starks, MD     10 mg at 07/27/18 1243     ?  omeprazole (PRILOSEC) capsule 40 mg   40 mg  Oral  ACB  Holladay, Versie Starks, MD     40 mg at 07/27/18 1243     ?  dextrose 5% - 0.45% NaCl with KCl 20 mEq/L infusion   125 mL/hr  IntraVENous  CONTINUOUS  Holladay, Versie Starks, MD  125 mL/hr at 07/27/18 0511  125 mL/hr at 07/27/18 0511     ?  loperamide (IMODIUM) capsule 2 mg   2 mg  Oral  Q4H PRN  Corene Cornea, MD     2 mg at 07/27/18 1146     ?  oxyCODONE-acetaminophen (PERCOCET) 5-325 mg per tablet 1 Tab   1 Tab  Oral  Q4H PRN  Theodis Aguas, MD     1 Tab at 07/27/18 1146     ?  simethicone (MYLICON) tablet 80 mg   80 mg  Oral  QID PRN  Theodis Aguas, MD     80 mg at 07/25/18 1532     ?  ondansetron (ZOFRAN) injection 4 mg   4 mg  IntraVENous  Q6H PRN  Jasarevic, Muhamed, MD     4 mg at 07/26/18 1511     ?  sodium chloride (NS) flush 5-10 mL   5-10 mL  IntraVENous  Q8H  Holladay, Versie Starks, MD     10 mL at 07/27/18 1244     ?  sodium chloride (NS) flush 5-10 mL   5-10 mL  IntraVENous  PRN  Earlie Lou, Muhamed, MD     10 mL at 07/23/18 0857     ?  naloxone (NARCAN) injection 0.1 mg   0.1 mg  IntraVENous  PRN  Jasarevic, Muhamed, MD                    ?  albuterol (PROVENTIL VENTOLIN) nebulizer solution 2.5 mg   2.5 mg  Nebulization  Q4H PRN  Irving Burton, MD              History reviewed. No pertinent  surgical history.   History reviewed. No pertinent family history.     Social History          Socioeconomic History         ?  Marital status:  SINGLE              Spouse name:  Not on file         ?  Number of children:  Not on file     ?  Years of education:  Not on file     ?  Highest education level:  Not on file       Occupational History        ?  Not on file       Social Needs         ?  Financial resource strain:  Not on file        ?  Food insecurity              Worry:  Not on file  Inability:  Not on file        ?  Transportation needs              Medical:  Not on file         Non-medical:  Not on file       Tobacco Use         ?  Smoking status:  Never Smoker     ?  Smokeless tobacco:  Never Used       Substance and Sexual Activity         ?  Alcohol use:  No     ?  Drug use:  No     ?  Sexual activity:  Yes              Partners:  Female         Birth control/protection:  Condom       Lifestyle        ?  Physical activity              Days per week:  Not on file         Minutes per session:  Not on file         ?  Stress:  Not on file       Relationships        ?  Social Engineer, manufacturing systemsconnections              Talks on phone:  Not on file         Gets together:  Not on file         Attends religious service:  Not on file         Active member of club or organization:  Not on file         Attends meetings of clubs or organizations:  Not on file         Relationship status:  Not on file        ?  Intimate partner violence              Fear of current or ex partner:  Not on file         Emotionally abused:  Not on file         Physically abused:  Not on file         Forced sexual activity:  Not on file        Other Topics  Concern        ?  Not on file       Social History Narrative        ?  Not on file           Review of Systems:   Positive for palpitations, light headedness, some weight loss over the past week.  He denies fevers, chills, sweats, headaches, lightheadedness, blurred vision, double vision,  dysphagia, odynophagia, dysuria, burning with urination, numbness,  tingling, weakness, easy bruising, easy bleeding, cough, rashes.        Objective:        Visit Vitals      BP  134/80 (BP 1 Location: Left arm, BP Patient Position: Supine)     Pulse  65     Temp  98.4 ??F (36.9 ??C)     Resp  16     Ht  5\' 9"  (1.753 m)     Wt  70.4 kg (155 lb 3.3 oz)  SpO2  100%        BMI  22.92 kg/m??        Temp (24hrs), Avg:98.7 ??F (37.1 ??C), Min:98.4 ??F (36.9 ??C), Max:99.1 ??F (37.3 ??C)        Vitals:             07/27/18 0402  07/27/18 0749  07/27/18 1130  07/27/18 1535           BP:  132/80  132/88  (!) 144/99  134/80     Pulse:  66  62  76  65     Resp:  Temp:  98.7 ??F (37.1 ??C)  98.8 ??F (37.1 ??C)  98.7 ??F (37.1 ??C)  98.4 ??F (36.9 ??C)     SpO2:  100%  100%  100%  100%     Weight:                   Height:                   Intake/Output Summary (Last 24 hours) at 07/27/2018 1614   Last data filed at 07/27/2018 0513     Gross per 24 hour        Intake  2743.75 ml        Output  --        Net  2743.75 ml           Physical Exam:   General: Lying comfortably, in no acute distress   HEENT: NC/AT, conjunctiva pink, nasopharynx without exudates   Neck: supple   Lungs: clear anteriorly   CV: RRR, +s1s2   Abd: soft, NT, ND, +BS   Ext: No cyanosis or edema   Neuro: Alert, moving all four extremeties         DATA        CBC w/Diff     Lab Results      Component  Value  Date/Time        WBC  2.8 (L)  07/27/2018 06:25 AM        WBC  3.2 (L)  07/25/2018 07:03 AM        WBC  3.7 (L)  07/24/2018 06:50 AM        RBC  4.28  07/27/2018 06:25 AM        RBC  4.51  07/25/2018 07:03 AM        RBC  4.84  07/24/2018 06:50 AM        HGB  12.0 (L)  07/27/2018 06:25 AM        HGB  12.8  07/25/2018 07:03 AM        HGB  13.9  07/24/2018 06:50 AM        HCT  36.0 (L)  07/27/2018 06:25 AM        HCT  38.4  07/25/2018 07:03 AM        HCT  41.0  07/24/2018 06:50 AM        MCV  84.1  07/27/2018 06:25 AM        MCV  85.1  07/25/2018 07:03 AM         MCV  84.7  07/24/2018 06:50 AM        MCH  28.0  07/27/2018 06:25 AM        MCH  28.4  07/25/2018 07:03 AM        MCH  28.7  07/24/2018 06:50 AM        MCHC  33.3  07/27/2018 06:25 AM        MCHC  33.3  07/25/2018 07:03 AM        MCHC  33.9  07/24/2018 06:50 AM        PLT  115 (L)  07/27/2018 06:25 AM        PLT  122 (L)  07/25/2018 07:03 AM        PLT  129 (L)  07/24/2018 06:50 AM           Lab Results      Component  Value  Date/Time        GRANS  43.0  07/27/2018 06:25 AM        GRANS  35.0  07/25/2018 07:03 AM        GRANS  45.6  07/24/2018 06:50 AM        MONOS  18.0 (H)  07/27/2018 06:25 AM        MONOS  22.8 (H)  07/25/2018 07:03 AM        MONOS  23.0 (H)  07/24/2018 06:50 AM        EOS  8.0 (H)  07/27/2018 06:25 AM        EOS  7.5 (H)  07/25/2018 07:03 AM        EOS  5.7 (H)  07/24/2018 06:50 AM        BASOS  1.0  07/27/2018 06:25 AM        BASOS  0.3  07/25/2018 07:03 AM        BASOS  0.5  07/24/2018 06:50 AM                  Hepatic Function     Recent Labs         07/27/18   0625      ALT  179*      TBILI  0.2      CBIL  0.1      AP  48      ALB  2.7*      TP  6.4                  Pancreatic Enzymes     Recent Labs         07/27/18   0625      LPSE  231                  Basic Metabolic Profile     Recent Labs         07/27/18   0625  07/26/18   0529  07/25/18   0703      NA  141  143  142      K  3.6  3.4*  3.3*      CL  117*  116*  114*      CO2  22  23  25       BUN  2*  5*  10      GLU  97  118*  90      CREA  0.6  0.8  0.9      CA  7.9*  8.0*  8.1*      MG   --   1.9  2.1                POC Glucose   No components found for: [facility]  Coagulation       No results for input(s): PTP, INR, APTT, INREXT in the last 72 hours.           Stool FOB No results found for: OB1, OB2, OB3, OCBL, POCOBL, SOB, OCCBLDEXT      Radiology Results   Ct Abd Pelv Wo Cont      Result Date: 07/22/2018   Abdomen and Pelvis CT scan. Without intravenous contrast. Comparison:  None Indication: AKI, diarrhea, vomiting.   Initial quality [facility] interpretation. Technique: 5 mm axial images without  Isovue 370 nonionic intravenous contrast. Oral contrast not  given. Findings: Visualized lungs: Clear Hepatobiliary: Normal Stomach: Normal Pancreas: Normal Spleen: Normal Adrenal glands: Normal Kidneys/ureters: Normal Peritoneum/retroperitoneum:  Normal Lymph nodes: Normal Bowel: Fluid-filled colonic loops. Mild  wall thickening of small bowel. Findings consistent with enteritis versus mild enterocolitis. No obstruction. Normal appendix.Marland Kitchen  Pelvic organs/Bladder: Normal Bones and soft tissues: Normal Other: None       Impression: 1. Findings consistent with enteritis versus mild enterocolitis. 2. Normal appendix DICOM format image data is available to non-affiliated external healthcare facilities or entities on a secure, media free, reciprocally searchable basis with  patient authorization for 12 months following the date of the study.             Larence Penning Zane Herald, M.D.   [facility]   [facility] [phone]   [place] Office [phone]

## 2018-07-27 NOTE — Progress Notes (Signed)
 NUTRITION FOLLOW UP  ADULT MALNUTRITION GUIDELINES    Patient is a 28 y.o. male, admitted on 07/21/2018 with a diagnosis of AKI (acute kidney injury) (HCC) [N17.9].  Nutrition assessment was completed by RD and the patient was found to meet the following malnutrition criteria established by ASPEN/AND:    Adult Malnutrition Guidelines:  SEVERE PROTEIN CALORIE MALNUTRITION IN THE CONTEXT OF ACUTE INJURY/ILLNESS  Energy intake: <50% energy intake compared to estimated energy needs >5 days   Wt loss 17.9% within 2 months time    Please document Severe  Protein Calorie Malnutrition in Problem List if in agreement    Current diet order: DIET NUTRITIONAL SUPPLEMENTS Breakfast, Dinner; Ensure Clear  DIET CLEAR LIQUID     Nutrition recommendation:     Regular diet as tolerated when medically able  Rec Ensure Clear BID (provides 240 kcals, 8 g protein each) while pt on clear liquid diet    If unable to advance diet rec consider alternate means of nutrition of PPN or TPN    Current intake: []  N/A- NPO    []  very poor     [x]  poor      []  fair      []  good  % Diet Eaten  Avg: 46 %  Min: 15 %  Max: 90 %    Pertinent Meds: dextrose, imodium, zofran    Pertinent Labs: 6/1 - glu 97, K 3.6, Na 141    Weight:   Last 3 Recorded Weights in this Encounter    07/21/18 2230 07/26/18 2031   Weight: 70.4 kg (155 lb 1.6 oz) 70.4 kg (155 lb 3.3 oz)       BMI: Body mass index is 22.92 kg/m.    Physical assessment:   Chewing/swallowing issues:   none   GI symptoms:       Last Bowel Movement Date: 07/26/18   Stool Appearance: Loose   Abdominal Assessment: Soft   Bowel Sounds: Active    Fluid retention:   none      Skin integrity:   none       Nutrition diagnosis:   1. .Severe acute pcm related to Inadequate calorie and nutrient intake related to intentional severe wt loss as evidenced by diet recall of pt consuming 1 -2 meals daily totaling 1000 calories per day, < 50% intake > 5 days and wt loss of 17.9% within 2 months time.  (continues)              2.      Altered gi function related to enterits as evidenced by abdominal pain, diarrhea need for clear liquid diet. (continues)    Assessment of Current MNT: Diet is appropriate for the time being but is inadequate to meet long-term needs. Adv diet when medically able    Monitoring and evaluation: MNT initiation and tolerance, wt, labs (BG, CMP), medical changes & treatment plan of care    Nutrition goals: Goal to meet 75% of nutrition needs or greater, dry wt stable through LOS, nutrition related lab values WNL, maintain skin integrity    Nutrition level of care:  []  low     []  moderate     [x]  High    Progress towards nutrition goals: []   Met/Ongoing     []   Progressing appropriately       []   Progressing slowly      [x]   Not Progressing    Code status: Full Code    Discharge Placement: Home with family  assistance  Comfort Care: Yes    Roxie Gail Hires, RD  07/27/18

## 2018-07-27 NOTE — Progress Notes (Signed)
 Problem: Falls - Risk of  Goal: *Absence of Falls  Description: Document Travis [name] Fall Risk and appropriate interventions in the flowsheet.  Outcome: Progressing Towards Goal  Note: Fall Risk Interventions:            Medication Interventions: Patient to call before getting OOB, Teach patient to arise slowly         History of Falls Interventions: Bed/chair exit alarm

## 2018-07-27 NOTE — Progress Notes (Signed)
Medical Progress Note      NAME: Travis [name]   DOB:  10-21-1990  MRM:  [id number]    Date/Time: 07/27/2018  12:07 PM         Subjective:     Pt still CO abd pain and still CO loose stools. He reports this all started during the day after eating sushi. He denies fever or chills. He denies melena or bloody stools but notes some blood on tissue paper with wiping. He vomitied yesterday. No vomit since but nausea persists.     Past Medical History reviewed and unchanged from Admission History and Physical    Review of Systems   Constitutional: Positive for fatigue.   HENT: Negative.    Eyes: Negative.    Respiratory: Negative.    Cardiovascular: Negative.    Gastrointestinal: Positive for abdominal pain, diarrhea and nausea. Negative for vomiting.   Endocrine: Negative.    Genitourinary: Negative.    Musculoskeletal: Negative.    Skin: Negative.    Neurological: Negative.    Hematological: Negative.    Psychiatric/Behavioral: Negative.             Objective:       Vitals:      Last 24hrs VS reviewed since prior progress note. Most recent are:    Visit Vitals  BP (!) 144/99 (BP 1 Location: Right arm, BP Patient Position: Supine)   Pulse 76   Temp 98.7 ??F (37.1 ??C)   Resp 16   Ht 5\' 9"  (1.753 m)   Wt 70.4 kg (155 lb 3.3 oz)   SpO2 100%   BMI 22.92 kg/m??     SpO2 Readings from Last 6 Encounters:   07/27/18 100%   04/10/18 99%   02/14/16 97%   01/27/16 99%   09/18/15 98%   06/05/15 99%            Intake/Output Summary (Last 24 hours) at 07/27/2018 1207  Last data filed at 07/27/2018 0513  Gross per 24 hour   Intake 2743.75 ml   Output ???   Net 2743.75 ml          Exam:      Physical Exam  Vitals signs and nursing note reviewed.   Constitutional:       Appearance: Normal appearance.   HENT:      Head: Normocephalic.      Nose: Nose normal.      Mouth/Throat:      Mouth: Mucous membranes are moist.   Eyes:      Extraocular Movements: Extraocular movements intact.      Pupils: Pupils are equal, round, and reactive to light.   Neck:       Musculoskeletal: Normal range of motion.   Cardiovascular:      Rate and Rhythm: Normal rate and regular rhythm.   Pulmonary:      Effort: Pulmonary effort is normal.      Breath sounds: Normal breath sounds.   Abdominal:      General: Bowel sounds are normal.      Palpations: Abdomen is soft.   Musculoskeletal: Normal range of motion.   Neurological:      General: No focal deficit present.      Mental Status: He is alert.           Lab Data Reviewed: (see below)      Medications:  Current Facility-Administered Medications   Medication Dose Route Frequency   ??? dextrose 5% - 0.45% NaCl  with KCl 20 mEq/L infusion  125 mL/hr IntraVENous CONTINUOUS   ??? loperamide (IMODIUM) capsule 2 mg  2 mg Oral Q4H PRN   ??? oxyCODONE-acetaminophen (PERCOCET) 5-325 mg per tablet 1 Tab  1 Tab Oral Q4H PRN   ??? simethicone (MYLICON) tablet 80 mg  80 mg Oral QID PRN   ??? ondansetron (ZOFRAN) injection 4 mg  4 mg IntraVENous Q6H PRN   ??? sodium chloride (NS) flush 5-10 mL  5-10 mL IntraVENous Q8H   ??? sodium chloride (NS) flush 5-10 mL  5-10 mL IntraVENous PRN   ??? naloxone (NARCAN) injection 0.1 mg  0.1 mg IntraVENous PRN   ??? albuterol (PROVENTIL VENTOLIN) nebulizer solution 2.5 mg  2.5 mg Nebulization Q4H PRN       ______________________________________________________________________      Lab Review:     Recent Labs     07/27/18  0625 07/25/18  0703   WBC 2.8* 3.2*   HGB 12.0* 12.8   HCT 36.0* 38.4   PLT 115* 122*     Recent Labs     07/27/18  0625 07/26/18  0529 07/25/18  0703   NA 141 143 142   K 3.6 3.4* 3.3*   CL 117* 116* 114*   CO2 22 23 25    GLU 97 118* 90   BUN 2* 5* 10   CREA 0.6 0.8 0.9   CA 7.9* 8.0* 8.1*   MG  --  1.9 2.1          Assessment:     Active Problems:    AKI (acute kidney injury) (HCC) (07/22/2018)           Plan:     Risk of deterioration: medium             1. Intractable nausea vomiting and abd pain. Continue symptom management. Evaluation and treatment in progress. GI consult pending  2. Acute renal failure due to  pre-renal state due to vomiting. Improved. SP [name] fluids  3. Enteritis. Tolerating clear liquid diet currently  4. HTN controlled  5. Asthma no active issues  6. Hx Bell's palsy no active issues  7. Severe protein calorie malnutrition. Advance diet as able  8. Hypkalemia due to diarrhea improved  9. Mild thrombocytopenia no active issues.          Total time spent with patient: 30 Minutes                  Care Plan discussed with: Patient, Nursing Staff and >50% of time spent in counseling and coordination of care    Discussed:  Care Plan    Prophylaxis:  SCD's    Disposition:  Home w/Family           ___________________________________________________    Attending Physician: Marlene BastJeffrey G Yomayra Tate, MD

## 2018-07-28 ENCOUNTER — Inpatient Hospital Stay: Payer: MEDICAID | Primary: Family Medicine

## 2018-07-28 ENCOUNTER — Inpatient Hospital Stay: Admit: 2018-07-29 | Payer: MEDICAID | Primary: Family Medicine

## 2018-07-28 LAB — BASIC METABOLIC PANEL
Anion Gap: 4 mmol/L — ABNORMAL LOW (ref 5–15)
BUN: 4 mg/dl — ABNORMAL LOW (ref 7–25)
CO2: 25 mEq/L (ref 21–32)
Calcium: 7.8 mg/dl — ABNORMAL LOW (ref 8.5–10.1)
Chloride: 111 mEq/L — ABNORMAL HIGH (ref 98–107)
Creatinine: 0.7 mg/dl (ref 0.6–1.3)
EGFR IF NonAfrican American: >60
GFR African American: >60
Glucose: 85 mg/dl (ref 74–106)
Potassium: 3.7 mEq/L (ref 3.5–5.1)
Sodium: 140 mEq/L (ref 136–145)

## 2018-07-28 LAB — CBC WITH AUTO DIFFERENTIAL
Basophils %: 0.4 % (ref 0–3)
Eosinophils %: 6 % — ABNORMAL HIGH (ref 0–5)
Hematocrit: 37 % (ref 37.0–50.0)
Hemoglobin: 12.1 gm/dl — ABNORMAL LOW (ref 12.4–17.2)
Immature Granulocytes: 0.4 % (ref 0.0–3.0)
Lymphocytes %: 43 % (ref 28–48)
MCH: 28.2 pg (ref 23.0–34.6)
MCHC: 32.7 gm/dl (ref 30.0–36.0)
MCV: 86.2 fL (ref 80.0–98.0)
MPV: 13.3 fL — ABNORMAL HIGH (ref 6.0–10.0)
Monocytes %: 9 % (ref 1–13)
Neutrophils %: 42 % (ref 34–64)
Nucleated RBCs: 0 (ref 0–0)
Platelet Comment: DECREASED
Platelets: 119 10*3/uL — ABNORMAL LOW (ref 140–450)
RBC: 4.29 M/uL (ref 3.80–5.70)
RDW-SD: 42.1 (ref 35.1–43.9)
WBC: 2.4 10*3/uL — ABNORMAL LOW (ref 4.0–11.0)

## 2018-07-28 LAB — HEPATIC FUNCTION PANEL
ALT (SGPT): 180 U/L — ABNORMAL HIGH (ref 12–78)
ALT: 180 U/L — ABNORMAL HIGH (ref 12–78)
AST (SGOT): 110 U/L — ABNORMAL HIGH (ref 15–37)
AST: 110 U/L — ABNORMAL HIGH (ref 15–37)
Albumin: 2.7 gm/dl — ABNORMAL LOW (ref 3.4–5.0)
Albumin: 2.7 gm/dl — ABNORMAL LOW (ref 3.4–5.0)
Alk. phosphatase: 49 U/L (ref 45–117)
Alkaline Phosphatase: 49 U/L (ref 45–117)
Bilirubin, Direct: 0.1 mg/dl (ref 0.0–0.2)
Bilirubin, direct: 0.1 mg/dl (ref 0.0–0.2)
Bilirubin, total: 0.2 mg/dl (ref 0.2–1.0)
Protein, total: 6.5 gm/dl (ref 6.4–8.2)
Total Bilirubin: 0.2 mg/dl (ref 0.2–1.0)
Total Protein: 6.5 gm/dl (ref 6.4–8.2)

## 2018-07-28 LAB — OCCULT BLOOD, FECAL: Occult Blood, Stool: NEGATIVE

## 2018-07-28 LAB — METABOLIC PANEL, BASIC
Anion gap: 4 mmol/L — ABNORMAL LOW (ref 5–15)
BUN: 4 mg/dl — ABNORMAL LOW (ref 7–25)
CO2: 25 mEq/L (ref 21–32)
Calcium: 7.8 mg/dl — ABNORMAL LOW (ref 8.5–10.1)
Chloride: 111 mEq/L — ABNORMAL HIGH (ref 98–107)
Creatinine: 0.7 mg/dl (ref 0.6–1.3)
GFR est AA: >60
GFR est non-AA: >60
Glucose: 85 mg/dl (ref 74–106)
Potassium: 3.7 mEq/L (ref 3.5–5.1)
Sodium: 140 mEq/L (ref 136–145)

## 2018-07-28 LAB — CBC WITH AUTOMATED DIFF
BASOPHILS: 0.4 % (ref 0–3)
EOSINOPHILS: 6 % — ABNORMAL HIGH (ref 0–5)
HCT: 37 % (ref 37.0–50.0)
HGB: 12.1 gm/dl — ABNORMAL LOW (ref 12.4–17.2)
IMMATURE GRANULOCYTES: 0.4 % (ref 0.0–3.0)
LYMPHOCYTES: 43 % (ref 28–48)
MCH: 28.2 pg (ref 23.0–34.6)
MCHC: 32.7 gm/dl (ref 30.0–36.0)
MCV: 86.2 fL (ref 80.0–98.0)
MONOCYTES: 9 % (ref 1–13)
MPV: 13.3 fL — ABNORMAL HIGH (ref 6.0–10.0)
NEUTROPHILS: 42 % (ref 34–64)
NRBC: 0 (ref 0–0)
PLATELET COMMENTS: DECREASED
PLATELET: 119 10*3/uL — ABNORMAL LOW (ref 140–450)
RBC: 4.29 M/uL (ref 3.80–5.70)
RDW-SD: 42.1 (ref 35.1–43.9)
WBC: 2.4 10*3/uL — ABNORMAL LOW (ref 4.0–11.0)

## 2018-07-28 LAB — OCCULT BLOOD, STOOL: Occult blood, stool: NEGATIVE

## 2018-07-28 MED FILL — OXYCODONE-ACETAMINOPHEN 5 MG-325 MG TAB: 5-325 mg | ORAL | Qty: 1

## 2018-07-28 MED FILL — DICYCLOMINE 10 MG CAP: 10 mg | ORAL | Qty: 1

## 2018-07-28 MED FILL — LOPERAMIDE 2 MG CAP: 2 mg | ORAL | Qty: 1

## 2018-07-28 MED FILL — OMEPRAZOLE 20 MG CAP, DELAYED RELEASE: 20 mg | ORAL | Qty: 2

## 2018-07-28 NOTE — Progress Notes (Signed)
Medical Progress Note      NAME: Travis [name]   DOB:  1991/01/25  MRM:  [id number]    Date/Time: 07/28/2018  4:22 PM         Subjective:     Pt states he is feeling a little better. Stools are still loose but are more formed than they were    Past Medical History reviewed and unchanged from Admission History and Physical    Review of Systems   Constitutional: Negative.    HENT: Negative.    Eyes: Negative.    Respiratory: Negative.    Cardiovascular: Negative.    Gastrointestinal: Positive for diarrhea and nausea.   Endocrine: Negative.    Genitourinary: Negative.    Musculoskeletal: Negative.    Skin: Negative.    Neurological: Negative.    Hematological: Negative.    Psychiatric/Behavioral: Negative.             Objective:       Vitals:      Last 24hrs VS reviewed since prior progress note. Most recent are:    Visit Vitals  BP 126/77 (BP 1 Location: Right arm, BP Patient Position: Supine)   Pulse (!) 57   Temp 98.1 ??F (36.7 ??C)   Resp 20   Ht 5\' 9"  (1.753 m)   Wt 70.4 kg (155 lb 3.3 oz)   SpO2 100%   BMI 22.92 kg/m??     SpO2 Readings from Last 6 Encounters:   07/28/18 100%   04/10/18 99%   02/14/16 97%   01/27/16 99%   09/18/15 98%   06/05/15 99%            Intake/Output Summary (Last 24 hours) at 07/28/2018 1622  Last data filed at 07/28/2018 0824  Gross per 24 hour   Intake 1329.17 ml   Output ???   Net 1329.17 ml          Exam:      Physical Exam  Vitals signs and nursing note reviewed.   HENT:      Head: Normocephalic.      Nose: Nose normal.      Mouth/Throat:      Mouth: Mucous membranes are moist.   Eyes:      Extraocular Movements: Extraocular movements intact.      Pupils: Pupils are equal, round, and reactive to light.   Neck:      Musculoskeletal: Normal range of motion.   Cardiovascular:      Rate and Rhythm: Normal rate and regular rhythm.   Pulmonary:      Effort: Pulmonary effort is normal.      Breath sounds: Normal breath sounds.   Abdominal:      General: Abdomen is flat. Bowel sounds are normal.       Palpations: Abdomen is soft.   Musculoskeletal: Normal range of motion.   Skin:     General: Skin is warm.   Neurological:      General: No focal deficit present.      Mental Status: He is alert.       Lab Data Reviewed: (see below)      Medications:  Current Facility-Administered Medications   Medication Dose Route Frequency   ??? dicyclomine (BENTYL) capsule 10 mg  10 mg Oral QID   ??? omeprazole (PRILOSEC) capsule 40 mg  40 mg Oral ACB   ??? dextrose 5% - 0.45% NaCl with KCl 20 mEq/L infusion  125 mL/hr IntraVENous CONTINUOUS   ??? loperamide (  IMODIUM) capsule 2 mg  2 mg Oral Q4H PRN   ??? oxyCODONE-acetaminophen (PERCOCET) 5-325 mg per tablet 1 Tab  1 Tab Oral Q4H PRN   ??? simethicone (MYLICON) tablet 80 mg  80 mg Oral QID PRN   ??? ondansetron (ZOFRAN) injection 4 mg  4 mg IntraVENous Q6H PRN   ??? sodium chloride (NS) flush 5-10 mL  5-10 mL IntraVENous Q8H   ??? sodium chloride (NS) flush 5-10 mL  5-10 mL IntraVENous PRN   ??? naloxone (NARCAN) injection 0.1 mg  0.1 mg IntraVENous PRN   ??? albuterol (PROVENTIL VENTOLIN) nebulizer solution 2.5 mg  2.5 mg Nebulization Q4H PRN       ______________________________________________________________________      Lab Review:     Recent Labs     07/28/18  0502 07/27/18  0625   WBC 2.4* 2.8*   HGB 12.1* 12.0*   HCT 37.0 36.0*   PLT 119* 115*     Recent Labs     07/28/18  0502 07/27/18  0625 07/26/18  0529   NA 140 141 143   K 3.7 3.6 3.4*   CL 111* 117* 116*   CO2 25 22 23    GLU 85 97 118*   BUN 4* 2* 5*   CREA 0.7 0.6 0.8   CA 7.8* 7.9* 8.0*   MG  --   --  1.9   ALB 2.7* 2.7*  --    ALT 180* 179*  --           Assessment:     Active Problems:    AKI (acute kidney injury) (HCC) (07/22/2018)           Plan:     Risk of deterioration: medium             Intractable nausea vomiting and abd pain. Continue symptom management. Evaluation and treatment in progress. GI consult in progress. Abs US and viral hepatitis panel pending. Stool studies pending.    Acute renal failure due to pre-renal state due to vomiting. Improved. SP [name] fluids   Enteritis. Tolerating clear liquid diet currently   HTN controlled   Asthma no active issues   Hx Bell's palsy no active issues   Severe protein calorie malnutrition. Advance diet as able   Hypkalemia due to diarrhea improved   Mild thrombocytopenia no active issues         Total time spent with patient: 30 Minutes                  Care Plan discussed with: Patient, Family, Care Manager, Nursing Staff, Consultant/Specialist and >50% of time spent in counseling and coordination of care    Discussed:  Care Plan and D/C Planning    Prophylaxis:  SCD's    Disposition:  Home w/Family           ___________________________________________________    Attending Physician: Marlene BastJeffrey G Micaila Ziemba, MD

## 2018-07-28 NOTE — Progress Notes (Signed)
A member of [facility]    GI Progress Note    Admit Date:  07/21/2018     Assessment:     1. Nausea, vomiting abdominal pain.  CT with enteritis vs mild enterocolitis.  Suspect likely infectious enteritis.  Improving although still with lower abdominal pain.  2. AKI. Improving.  3. Elevated transaminases.  CT with unremarkable liver.  4. History of Bell's palsy    Plan:     1. Await stool studies  2. Continue Zofran PRn  3. Continue clear liquids and advance slowly as tolerated  4. Continue PPI  5. Monitor LFTs  6. Check RUQ abdominal U/S, viral hepatitis panel    Subjective:     Diarrhea and nausea improved.  No vomiting.  Continues to have lower abdominal pain/cramps which are better than before although persist.    Patient Active Problem List   Diagnosis Code   ??? AKI (acute kidney injury) (HCC) N17.9        Objective:     Visit Vitals  BP 134/82 (BP 1 Location: Right arm, BP Patient Position: Supine)   Pulse 61   Temp 98 ??F (36.7 ??C)   Resp 20   Ht  (1.753 m)   Wt 70.4 kg (155 lb 3.3 oz)   SpO2 100%   BMI 22.92 kg/m??      Temp (24hrs), Avg:98.5 ??F (36.9 ??C), Min:98 ??F (36.7 ??C), Max:98.9 ??F (37.2 ??C)       Physical Exam:  General: Lying comfortably, in no acute distress  Lungs: clear anteriorly  CV: +s1s2  Abd: soft, minimal lower abdominal discomfort to palpation, no rebound or guarding, +BS      Intake/Output Summary (Last 24 hours) at 07/28/2018 0959  Last data filed at 07/28/2018 0824  Gross per 24 hour   Intake 1569.17 ml   Output ???   Net 1569.17 ml       Current Facility-Administered Medications   Medication Dose Route Frequency Provider Last Rate Last Dose   ??? dicyclomine (BENTYL) capsule 10 mg  10 mg Oral QID Holladay, Versie Starks, MD   10 mg at 07/28/18 0915   ??? omeprazole (PRILOSEC) capsule 40 mg  40 mg Oral ACB Holladay, Versie Starks, MD   40 mg at 07/28/18 0915   ??? dextrose 5% - 0.45% NaCl with KCl 20 mEq/L infusion  125 mL/hr  IntraVENous CONTINUOUS Holladay, Versie Starks, MD 125 mL/hr at 07/27/18 2124 125 mL/hr at 07/27/18 2124   ??? loperamide (IMODIUM) capsule 2 mg  2 mg Oral Q4H PRN Corene Cornea, MD   2 mg at 07/27/18 2124   ??? oxyCODONE-acetaminophen (PERCOCET) 5-325 mg per tablet 1 Tab  1 Tab Oral Q4H PRN Theodis Aguas, MD   1 Tab at 07/28/18 4540   ??? simethicone (MYLICON) tablet 80 mg  80 mg Oral QID PRN Theodis Aguas, MD   80 mg at 07/25/18 1532   ??? ondansetron (ZOFRAN) injection 4 mg  4 mg IntraVENous Q6H PRN Jasarevic, Muhamed, MD   4 mg at 07/26/18 1511   ??? sodium chloride (NS) flush 5-10 mL  5-10 mL IntraVENous Q8H Holladay, Versie Starks, MD   10 mL at 07/27/18 1244   ??? sodium chloride (NS) flush 5-10 mL  5-10 mL IntraVENous PRN Irving Burton, MD   10 mL at 07/23/18 0857   ??? naloxone (NARCAN) injection 0.1 mg  0.1 mg IntraVENous PRN Irving Burton, MD       ???  albuterol (PROVENTIL VENTOLIN) nebulizer solution 2.5 mg  2.5 mg Nebulization Q4H PRN Irving BurtonJasarevic, Muhamed, MD           CBC w/Diff   Lab Results   Component Value Date/Time    WBC 2.4 (L) 07/28/2018 05:02 AM    WBC 2.8 (L) 07/27/2018 06:25 AM    WBC 3.2 (L) 07/25/2018 07:03 AM    RBC 4.29 07/28/2018 05:02 AM    RBC 4.28 07/27/2018 06:25 AM    RBC 4.51 07/25/2018 07:03 AM    HGB 12.1 (L) 07/28/2018 05:02 AM    HGB 12.0 (L) 07/27/2018 06:25 AM    HGB 12.8 07/25/2018 07:03 AM    HCT 37.0 07/28/2018 05:02 AM    HCT 36.0 (L) 07/27/2018 06:25 AM    HCT 38.4 07/25/2018 07:03 AM    MCV 86.2 07/28/2018 05:02 AM    MCV 84.1 07/27/2018 06:25 AM    MCV 85.1 07/25/2018 07:03 AM    MCH 28.2 07/28/2018 05:02 AM    MCH 28.0 07/27/2018 06:25 AM    MCH 28.4 07/25/2018 07:03 AM    MCHC 32.7 07/28/2018 05:02 AM    MCHC 33.3 07/27/2018 06:25 AM    MCHC 33.3 07/25/2018 07:03 AM    PLT 119 (L) 07/28/2018 05:02 AM    PLT 115 (L) 07/27/2018 06:25 AM    PLT 122 (L) 07/25/2018 07:03 AM     Lab Results   Component Value Date/Time    GRANS 43.0 07/27/2018 06:25 AM     GRANS 35.0 07/25/2018 07:03 AM    GRANS 45.6 07/24/2018 06:50 AM    MONOS 18.0 (H) 07/27/2018 06:25 AM    MONOS 22.8 (H) 07/25/2018 07:03 AM    MONOS 23.0 (H) 07/24/2018 06:50 AM    EOS 8.0 (H) 07/27/2018 06:25 AM    EOS 7.5 (H) 07/25/2018 07:03 AM    EOS 5.7 (H) 07/24/2018 06:50 AM    BASOS 1.0 07/27/2018 06:25 AM    BASOS 0.3 07/25/2018 07:03 AM    BASOS 0.5 07/24/2018 06:50 AM        Hepatic Function   Lab Results   Component Value Date/Time    ALT 180 (H) 07/28/2018 05:02 AM    ALT 179 (H) 07/27/2018 06:25 AM    ALT 27 07/23/2018 06:50 AM    TBILI 0.2 07/28/2018 05:02 AM    TBILI 0.2 07/27/2018 06:25 AM    TBILI 1.2 (H) 07/23/2018 06:50 AM    CBIL 0.1 07/28/2018 05:02 AM    CBIL 0.1 07/27/2018 06:25 AM    AP 49 07/28/2018 05:02 AM    AP 48 07/27/2018 06:25 AM    AP 54 07/23/2018 06:50 AM    ALB 2.7 (L) 07/28/2018 05:02 AM    ALB 2.7 (L) 07/27/2018 06:25 AM    ALB 3.2 (L) 07/23/2018 06:50 AM    TP 6.5 07/28/2018 05:02 AM    TP 6.4 07/27/2018 06:25 AM    TP 7.6 07/23/2018 06:50 AM        Pancreatic Enzymes   Lab Results   Component Value Date/Time    LPSE 231 07/27/2018 06:25 AM    LPSE 67 (L) 07/21/2018 10:42 PM        Basic Metabolic Profile   Lab Results   Component Value Date/Time    NA 140 07/28/2018 05:02 AM    NA 141 07/27/2018 06:25 AM    NA 143 07/26/2018 05:29 AM    K 3.7 07/28/2018 05:02 AM  K 3.6 07/27/2018 06:25 AM    K 3.4 (L) 07/26/2018 05:29 AM    CL 111 (H) 07/28/2018 05:02 AM    CL 117 (H) 07/27/2018 06:25 AM    CL 116 (H) 07/26/2018 05:29 AM    CO2 25 07/28/2018 05:02 AM    CO2 22 07/27/2018 06:25 AM    CO2 23 07/26/2018 05:29 AM    BUN 4 (L) 07/28/2018 05:02 AM    BUN 2 (L) 07/27/2018 06:25 AM    BUN 5 (L) 07/26/2018 05:29 AM    GLU 85 07/28/2018 05:02 AM    GLU 97 07/27/2018 06:25 AM    GLU 118 (H) 07/26/2018 05:29 AM    CREA 0.7 07/28/2018 05:02 AM    CREA 0.6 07/27/2018 06:25 AM    CREA 0.8 07/26/2018 05:29 AM    CA 7.8 (L) 07/28/2018 05:02 AM    CA 7.9 (L) 07/27/2018 06:25 AM     CA 8.0 (L) 07/26/2018 05:29 AM    MG 1.9 07/26/2018 05:29 AM    MG 2.1 07/25/2018 07:03 AM    MG 2.4 07/24/2018 06:50 AM        Coagulation   No results found for: PTP, INR, APTT, INREXT     Stool FOB No results found for: OB1, OB2, OB3, OCBL, POCOBL, SOB, OCCBLDEXT    Microbiology Results  No results for input(s): CULT in the last 72 hours.    Radiology Results  Ct Abd Pelv Wo Cont    Result Date: 07/22/2018  Abdomen and Pelvis CT scan. Without intravenous contrast. Comparison:  None Indication: AKI, diarrhea, vomiting.  Initial quality [facility] interpretation. Technique: 5 mm axial images without  Isovue 370 nonionic intravenous contrast. Oral contrast not given. Findings: Visualized lungs: Clear Hepatobiliary: Normal Stomach: Normal Pancreas: Normal Spleen: Normal Adrenal glands: Normal Kidneys/ureters: Normal Peritoneum/retroperitoneum:  Normal Lymph nodes: Normal Bowel: Fluid-filled colonic loops. Mild wall thickening of small bowel. Findings consistent with enteritis versus mild enterocolitis. No obstruction. Normal appendix.Marland Kitchen  Pelvic organs/Bladder: Normal Bones and soft tissues: Normal Other: None     Impression: 1. Findings consistent with enteritis versus mild enterocolitis. 2. Normal appendix DICOM format image data is available to non-affiliated external healthcare facilities or entities on a secure, media free, reciprocally searchable basis with patient authorization for 12 months following the date of the study.        Larence Penning Zane Herald, M.D.  [facility]  [facility] [phone]  [place] Office [phone]

## 2018-07-28 NOTE — Other (Signed)
Bedside shift change report given to Amy L Runge   (oncoming nurse) by tabitha (offgoing nurse). Report included the following information SBAR.

## 2018-07-28 NOTE — Progress Notes (Signed)
Problem: Risk for Spread of Infection  Goal: Prevent transmission of infectious organism to others  Description: Prevent the transmission of infectious organisms to other patients, staff members, and visitors.  Outcome: Progressing Towards Goal     Problem: Patient Education:  Go to Education Activity  Goal: Patient/Family Education  Outcome: Progressing Towards Goal     Problem: Falls - Risk of  Goal: *Absence of Falls  Description: Document Schmid Fall Risk and appropriate interventions in the flowsheet.  Outcome: Progressing Towards Goal  Note: Fall Risk Interventions:            Medication Interventions: Patient to call before getting OOB, Teach patient to arise slowly         History of Falls Interventions: Bed/chair exit alarm         Problem: Patient Education: Go to Patient Education Activity  Goal: Patient/Family Education  Outcome: Progressing Towards Goal

## 2018-07-28 NOTE — Progress Notes (Signed)
Problem: Risk for Spread of Infection  Goal: Prevent transmission of infectious organism to others  Description: Prevent the transmission of infectious organisms to other patients, staff members, and visitors.  Outcome: Progressing Towards Goal     Problem: Falls - Risk of  Goal: *Absence of Falls  Description: Document Schmid Fall Risk and appropriate interventions in the flowsheet.  Outcome: Progressing Towards Goal  Note: Fall Risk Interventions:            Medication Interventions: Patient to call before getting OOB, Teach patient to arise slowly         History of Falls Interventions: Bed/chair exit alarm

## 2018-07-28 NOTE — Progress Notes (Signed)
Medical Progress Note      NAME: Travis [name]   DOB:  1991/01/25  MRM:  [id number]    Date/Time: 07/28/2018  4:22 PM         Subjective:     Pt states he is feeling a little better. Stools are still loose but are more formed than they were    Past Medical History reviewed and unchanged from Admission History and Physical    Review of Systems   Constitutional: Negative.    HENT: Negative.    Eyes: Negative.    Respiratory: Negative.    Cardiovascular: Negative.    Gastrointestinal: Positive for diarrhea and nausea.   Endocrine: Negative.    Genitourinary: Negative.    Musculoskeletal: Negative.    Skin: Negative.    Neurological: Negative.    Hematological: Negative.    Psychiatric/Behavioral: Negative.             Objective:       Vitals:      Last 24hrs VS reviewed since prior progress note. Most recent are:    Visit Vitals  BP 126/77 (BP 1 Location: Right arm, BP Patient Position: Supine)   Pulse (!) 57   Temp 98.1 ??F (36.7 ??C)   Resp 20   Ht 5\' 9"  (1.753 m)   Wt 70.4 kg (155 lb 3.3 oz)   SpO2 100%   BMI 22.92 kg/m??     SpO2 Readings from Last 6 Encounters:   07/28/18 100%   04/10/18 99%   02/14/16 97%   01/27/16 99%   09/18/15 98%   06/05/15 99%            Intake/Output Summary (Last 24 hours) at 07/28/2018 1622  Last data filed at 07/28/2018 0824  Gross per 24 hour   Intake 1329.17 ml   Output ???   Net 1329.17 ml          Exam:      Physical Exam  Vitals signs and nursing note reviewed.   HENT:      Head: Normocephalic.      Nose: Nose normal.      Mouth/Throat:      Mouth: Mucous membranes are moist.   Eyes:      Extraocular Movements: Extraocular movements intact.      Pupils: Pupils are equal, round, and reactive to light.   Neck:      Musculoskeletal: Normal range of motion.   Cardiovascular:      Rate and Rhythm: Normal rate and regular rhythm.   Pulmonary:      Effort: Pulmonary effort is normal.      Breath sounds: Normal breath sounds.   Abdominal:      General: Abdomen is flat. Bowel sounds are normal.       Palpations: Abdomen is soft.   Musculoskeletal: Normal range of motion.   Skin:     General: Skin is warm.   Neurological:      General: No focal deficit present.      Mental Status: He is alert.       Lab Data Reviewed: (see below)      Medications:  Current Facility-Administered Medications   Medication Dose Route Frequency   ??? dicyclomine (BENTYL) capsule 10 mg  10 mg Oral QID   ??? omeprazole (PRILOSEC) capsule 40 mg  40 mg Oral ACB   ??? dextrose 5% - 0.45% NaCl with KCl 20 mEq/L infusion  125 mL/hr IntraVENous CONTINUOUS   ??? loperamide (  IMODIUM) capsule 2 mg  2 mg Oral Q4H PRN   ??? oxyCODONE-acetaminophen (PERCOCET) 5-325 mg per tablet 1 Tab  1 Tab Oral Q4H PRN   ??? simethicone (MYLICON) tablet 80 mg  80 mg Oral QID PRN   ??? ondansetron (ZOFRAN) injection 4 mg  4 mg IntraVENous Q6H PRN   ??? sodium chloride (NS) flush 5-10 mL  5-10 mL IntraVENous Q8H   ??? sodium chloride (NS) flush 5-10 mL  5-10 mL IntraVENous PRN   ??? naloxone (NARCAN) injection 0.1 mg  0.1 mg IntraVENous PRN   ??? albuterol (PROVENTIL VENTOLIN) nebulizer solution 2.5 mg  2.5 mg Nebulization Q4H PRN       ______________________________________________________________________      Lab Review:     Recent Labs     07/28/18  0502 07/27/18  0625   WBC 2.4* 2.8*   HGB 12.1* 12.0*   HCT 37.0 36.0*   PLT 119* 115*     Recent Labs     07/28/18  0502 07/27/18  0625 07/26/18  0529   NA 140 141 143   K 3.7 3.6 3.4*   CL 111* 117* 116*   CO2 25 22 23    GLU 85 97 118*   BUN 4* 2* 5*   CREA 0.7 0.6 0.8   CA 7.8* 7.9* 8.0*   MG  --   --  1.9   ALB 2.7* 2.7*  --    ALT 180* 179*  --           Assessment:     Active Problems:    AKI (acute kidney injury) (HCC) (07/22/2018)           Plan:     Risk of deterioration: medium             Intractable nausea vomiting and abd pain. Continue symptom management. Evaluation and treatment in progress. GI consult in progress. Abs [place] and viral hepatitis panel pending. Stool studies pending.   Acute renal failure due to pre-renal state  due to vomiting. Improved. SP [name] fluids   Enteritis. Tolerating clear liquid diet currently   HTN controlled   Asthma no active issues   Hx Bell's palsy no active issues   Severe protein calorie malnutrition. Advance diet as able   Hypkalemia due to diarrhea improved   Mild thrombocytopenia no active issues         Total time spent with patient: 30 Minutes                  Care Plan discussed with: Patient, Family, Care Manager, Nursing Staff, Consultant/Specialist and >50% of time spent in counseling and coordination of care    Discussed:  Care Plan and D/C Planning    Prophylaxis:  SCD's    Disposition:  Home w/Family           ___________________________________________________    Attending Physician: Marlene Bast, MD

## 2018-07-28 NOTE — Progress Notes (Signed)
Progress  Notes by Jonna Coup, MD at 07/28/18 [phone]                Author: Jonna Coup, MD  Service: Gastroenterology  Author Type: Physician       Filed: 07/28/18 1006  Date of Service: 07/28/18 0959  Status: Signed          Editor: Jonna Coup, MD (Physician)                    A member of [facility]      GI Progress Note      Admit Date:  07/21/2018         Assessment:        1. Nausea, vomiting abdominal pain.  CT with enteritis vs mild enterocolitis.  Suspect likely infectious enteritis.  Improving although still with lower  abdominal pain.   2. AKI. Improving.   3. Elevated transaminases.  CT with unremarkable liver.   4. History of Bell's palsy        Plan:        1. Await stool studies   2. Continue Zofran PRn   3. Continue clear liquids and advance slowly as tolerated   4. Continue PPI   5. Monitor LFTs   6. Check RUQ abdominal U/S, viral hepatitis panel        Subjective:        Diarrhea and nausea improved.  No vomiting.  Continues to have lower abdominal pain/cramps which are better than before although persist.        Patient Active Problem List        Diagnosis  Code         ?  AKI (acute kidney injury) (HCC)  N17.9              Objective:        Visit Vitals      BP  134/82 (BP 1 Location: Right arm, BP Patient Position: Supine)     Pulse  61     Temp  98 ??F (36.7 ??C)     Resp  20     Ht   (1.753 m)     Wt  70.4 kg (155 lb 3.3 oz)     SpO2  100%        BMI  22.92 kg/m??         Temp (24hrs), Avg:98.5 ??F (36.9 ??C), Min:98 ??F (36.7 ??C), Max:98.9 ??F (37.2 ??C)          Physical Exam:   General: Lying comfortably, in no acute distress   Lungs: clear anteriorly   CV: +s1s2   Abd: soft, minimal lower abdominal discomfort to palpation, no rebound or guarding, +BS         Intake/Output Summary (Last 24 hours) at 07/28/2018 0959   Last data filed at 07/28/2018 9518     Gross per 24 hour        Intake  1569.17 ml        Output  --        Net  1569.17 ml              Current Facility-Administered Medications             Medication  Dose  Route  Frequency  Provider  Last Rate  Last Dose              ?  dicyclomine (BENTYL) capsule 10 mg   10 mg  Oral  QID  Holladay, Versie Starks, MD     10 mg at 07/28/18 0915     ?  omeprazole (PRILOSEC) capsule 40 mg   40 mg  Oral  ACB  Holladay, Versie Starks, MD     40 mg at 07/28/18 0915     ?  dextrose 5% - 0.45% NaCl with KCl 20 mEq/L infusion   125 mL/hr  IntraVENous  CONTINUOUS  Holladay, Versie Starks, MD  125 mL/hr at 07/27/18 2124  125 mL/hr at 07/27/18 2124     ?  loperamide (IMODIUM) capsule 2 mg   2 mg  Oral  Q4H PRN  Corene Cornea, MD     2 mg at 07/27/18 2124     ?  oxyCODONE-acetaminophen (PERCOCET) 5-325 mg per tablet 1 Tab   1 Tab  Oral  Q4H PRN  Theodis Aguas, MD     1 Tab at 07/28/18 0607     ?  simethicone (MYLICON) tablet 80 mg   80 mg  Oral  QID PRN  Theodis Aguas, MD     80 mg at 07/25/18 1532     ?  ondansetron (ZOFRAN) injection 4 mg   4 mg  IntraVENous  Q6H PRN  Jasarevic, Muhamed, MD     4 mg at 07/26/18 1511     ?  sodium chloride (NS) flush 5-10 mL   5-10 mL  IntraVENous  Q8H  Holladay, Versie Starks, MD     10 mL at 07/27/18 1244     ?  sodium chloride (NS) flush 5-10 mL   5-10 mL  IntraVENous  PRN  Irving Burton, MD     10 mL at 07/23/18 0857     ?  naloxone (NARCAN) injection 0.1 mg   0.1 mg  IntraVENous  PRN  Irving Burton, MD                    ?  albuterol (PROVENTIL VENTOLIN) nebulizer solution 2.5 mg   2.5 mg  Nebulization  Q4H PRN  Irving Burton, MD                   CBC w/Diff     Lab Results      Component  Value  Date/Time        WBC  2.4 (L)  07/28/2018 05:02 AM        WBC  2.8 (L)  07/27/2018 06:25 AM        WBC  3.2 (L)  07/25/2018 07:03 AM        RBC  4.29  07/28/2018 05:02 AM        RBC  4.28  07/27/2018 06:25 AM        RBC  4.51  07/25/2018 07:03 AM        HGB  12.1 (L)  07/28/2018 05:02 AM        HGB  12.0 (L)  07/27/2018 06:25 AM        HGB  12.8  07/25/2018 07:03 AM        HCT  37.0   07/28/2018 05:02 AM        HCT  36.0 (L)  07/27/2018 06:25 AM        HCT  38.4  07/25/2018 07:03 AM        MCV  86.2  07/28/2018 05:02 AM  MCV  84.1  07/27/2018 06:25 AM        MCV  85.1  07/25/2018 07:03 AM        MCH  28.2  07/28/2018 05:02 AM        MCH  28.0  07/27/2018 06:25 AM        MCH  28.4  07/25/2018 07:03 AM        MCHC  32.7  07/28/2018 05:02 AM        MCHC  33.3  07/27/2018 06:25 AM        MCHC  33.3  07/25/2018 07:03 AM        PLT  119 (L)  07/28/2018 05:02 AM        PLT  115 (L)  07/27/2018 06:25 AM        PLT  122 (L)  07/25/2018 07:03 AM           Lab Results      Component  Value  Date/Time        GRANS  43.0  07/27/2018 06:25 AM        GRANS  35.0  07/25/2018 07:03 AM        GRANS  45.6  07/24/2018 06:50 AM        MONOS  18.0 (H)  07/27/2018 06:25 AM        MONOS  22.8 (H)  07/25/2018 07:03 AM        MONOS  23.0 (H)  07/24/2018 06:50 AM        EOS  8.0 (H)  07/27/2018 06:25 AM        EOS  7.5 (H)  07/25/2018 07:03 AM        EOS  5.7 (H)  07/24/2018 06:50 AM        BASOS  1.0  07/27/2018 06:25 AM        BASOS  0.3  07/25/2018 07:03 AM        BASOS  0.5  07/24/2018 06:50 AM                  Hepatic Function     Lab Results      Component  Value  Date/Time        ALT  180 (H)  07/28/2018 05:02 AM        ALT  179 (H)  07/27/2018 06:25 AM        ALT  27  07/23/2018 06:50 AM        TBILI  0.2  07/28/2018 05:02 AM        TBILI  0.2  07/27/2018 06:25 AM        TBILI  1.2 (H)  07/23/2018 06:50 AM        CBIL  0.1  07/28/2018 05:02 AM        CBIL  0.1  07/27/2018 06:25 AM        AP  49  07/28/2018 05:02 AM        AP  48  07/27/2018 06:25 AM        AP  54  07/23/2018 06:50 AM        ALB  2.7 (L)  07/28/2018 05:02 AM        ALB  2.7 (L)  07/27/2018 06:25 AM        ALB  3.2 (L)  07/23/2018 06:50 AM        TP  6.5  07/28/2018 05:02 AM  TP  6.4  07/27/2018 06:25 AM        TP  7.6  07/23/2018 06:50 AM                  Pancreatic Enzymes     Lab Results      Component  Value  Date/Time        LPSE   231  07/27/2018 06:25 AM        LPSE  67 (L)  07/21/2018 10:42 PM                  Basic Metabolic Profile     Lab Results      Component  Value  Date/Time        NA  140  07/28/2018 05:02 AM        NA  141  07/27/2018 06:25 AM        NA  143  07/26/2018 05:29 AM        K  3.7  07/28/2018 05:02 AM        K  3.6  07/27/2018 06:25 AM        K  3.4 (L)  07/26/2018 05:29 AM        CL  111 (H)  07/28/2018 05:02 AM        CL  117 (H)  07/27/2018 06:25 AM        CL  116 (H)  07/26/2018 05:29 AM        CO2  25  07/28/2018 05:02 AM        CO2  22  07/27/2018 06:25 AM        CO2  23  07/26/2018 05:29 AM        BUN  4 (L)  07/28/2018 05:02 AM        BUN  2 (L)  07/27/2018 06:25 AM        BUN  5 (L)  07/26/2018 05:29 AM        GLU  85  07/28/2018 05:02 AM        GLU  97  07/27/2018 06:25 AM        GLU  118 (H)  07/26/2018 05:29 AM        CREA  0.7  07/28/2018 05:02 AM        CREA  0.6  07/27/2018 06:25 AM        CREA  0.8  07/26/2018 05:29 AM        CA  7.8 (L)  07/28/2018 05:02 AM        CA  7.9 (L)  07/27/2018 06:25 AM        CA  8.0 (L)  07/26/2018 05:29 AM        MG  1.9  07/26/2018 05:29 AM        MG  2.1  07/25/2018 07:03 AM        MG  2.4  07/24/2018 06:50 AM                  Coagulation       No results found for: PTP, INR, APTT, INREXT        Stool FOB No results found for: OB1, OB2, OB3, OCBL, POCOBL, SOB, OCCBLDEXT      Microbiology Results   No results for input(s): CULT in the last 72 hours.      Radiology Results   Ct Abd Pelv Wo Cont  Result Date: 07/22/2018   Abdomen and Pelvis CT scan. Without intravenous contrast. Comparison:  None Indication: AKI, diarrhea, vomiting.  Initial quality [facility] interpretation. Technique: 5 mm axial images without  Isovue 370 nonionic intravenous contrast. Oral contrast not  given. Findings: Visualized lungs: Clear Hepatobiliary: Normal Stomach: Normal Pancreas: Normal Spleen: Normal Adrenal glands: Normal Kidneys/ureters: Normal Peritoneum/retroperitoneum:  Normal Lymph  nodes: Normal Bowel: Fluid-filled colonic loops. Mild  wall thickening of small bowel. Findings consistent with enteritis versus mild enterocolitis. No obstruction. Normal appendix.Marland Kitchen  Pelvic organs/Bladder: Normal Bones and soft tissues: Normal Other: None       Impression: 1. Findings consistent with enteritis versus mild enterocolitis. 2. Normal appendix DICOM format image data is available to non-affiliated external healthcare facilities or entities on a secure, media free, reciprocally searchable basis with  patient authorization for 12 months following the date of the study.           Larence Penning Zane Herald, M.D.   [facility]   [facility] [phone]   [place] Office [phone]

## 2018-07-29 LAB — CBC WITH AUTO DIFFERENTIAL
Basophils %: 0.3 % (ref 0–3)
Eosinophils %: 5.2 % — ABNORMAL HIGH (ref 0–5)
Hematocrit: 36.8 % — ABNORMAL LOW (ref 37.0–50.0)
Hemoglobin: 12.3 gm/dl — ABNORMAL LOW (ref 12.4–17.2)
Immature Granulocytes: 0.3 % (ref 0.0–3.0)
Lymphocytes %: 23.7 % — ABNORMAL LOW (ref 28–48)
MCH: 28.5 pg (ref 23.0–34.6)
MCHC: 33.4 gm/dl (ref 30.0–36.0)
MCV: 85.2 fL (ref 80.0–98.0)
MPV: 12.2 fL — ABNORMAL HIGH (ref 6.0–10.0)
Monocytes %: 12.9 % (ref 1–13)
Neutrophils %: 57.6 % (ref 34–64)
Nucleated RBCs: 0 (ref 0–0)
Platelets: 118 10*3/uL — ABNORMAL LOW (ref 140–450)
RBC: 4.32 M/uL (ref 3.80–5.70)
RDW-SD: 41.7 (ref 35.1–43.9)
WBC: 3.6 10*3/uL — ABNORMAL LOW (ref 4.0–11.0)

## 2018-07-29 LAB — HEPATIC FUNCTION PANEL
ALT (SGPT): 172 U/L — ABNORMAL HIGH (ref 12–78)
ALT: 172 U/L — ABNORMAL HIGH (ref 12–78)
AST (SGOT): 92 U/L — ABNORMAL HIGH (ref 15–37)
AST: 92 U/L — ABNORMAL HIGH (ref 15–37)
Albumin: 2.8 gm/dl — ABNORMAL LOW (ref 3.4–5.0)
Albumin: 2.8 gm/dl — ABNORMAL LOW (ref 3.4–5.0)
Alk. phosphatase: 55 U/L (ref 45–117)
Alkaline Phosphatase: 55 U/L (ref 45–117)
Bilirubin, Direct: 0.1 mg/dl (ref 0.0–0.2)
Bilirubin, direct: 0.1 mg/dl (ref 0.0–0.2)
Bilirubin, total: 0.6 mg/dl (ref 0.2–1.0)
Protein, total: 6.7 gm/dl (ref 6.4–8.2)
Total Bilirubin: 0.6 mg/dl (ref 0.2–1.0)
Total Protein: 6.7 gm/dl (ref 6.4–8.2)

## 2018-07-29 LAB — BASIC METABOLIC PANEL
Anion Gap: 4 mmol/L — ABNORMAL LOW (ref 5–15)
BUN: 4 mg/dl — ABNORMAL LOW (ref 7–25)
CO2: 25 mEq/L (ref 21–32)
Calcium: 8 mg/dl — ABNORMAL LOW (ref 8.5–10.1)
Chloride: 112 mEq/L — ABNORMAL HIGH (ref 98–107)
Creatinine: 0.7 mg/dl (ref 0.6–1.3)
EGFR IF NonAfrican American: >60
GFR African American: >60
Glucose: 91 mg/dl (ref 74–106)
Potassium: 3.8 mEq/L (ref 3.5–5.1)
Sodium: 141 mEq/L (ref 136–145)

## 2018-07-29 LAB — HEPATITIS PANEL, ACUTE
HCV Ab: NONREACTIVE
Hep A IgM: NONREACTIVE
Hep B Core Ab, IgM: NONREACTIVE
Hepatitis A, IgM: NONREACTIVE
Hepatitis B Surface Ag: NONREACTIVE
Hepatitis B core, IgM: NONREACTIVE
Hepatitis B surface Ag: NONREACTIVE
Hepatitis C virus Ab: NONREACTIVE
SIGNAL TO CUTOFF (HEP C): 0
Signal to Cutoff (Hep C): 0

## 2018-07-29 LAB — CBC WITH AUTOMATED DIFF
BASOPHILS: 0.3 % (ref 0–3)
EOSINOPHILS: 5.2 % — ABNORMAL HIGH (ref 0–5)
HCT: 36.8 % — ABNORMAL LOW (ref 37.0–50.0)
HGB: 12.3 gm/dl — ABNORMAL LOW (ref 12.4–17.2)
IMMATURE GRANULOCYTES: 0.3 % (ref 0.0–3.0)
LYMPHOCYTES: 23.7 % — ABNORMAL LOW (ref 28–48)
MCH: 28.5 pg (ref 23.0–34.6)
MCHC: 33.4 gm/dl (ref 30.0–36.0)
MCV: 85.2 fL (ref 80.0–98.0)
MONOCYTES: 12.9 % (ref 1–13)
MPV: 12.2 fL — ABNORMAL HIGH (ref 6.0–10.0)
NEUTROPHILS: 57.6 % (ref 34–64)
NRBC: 0 (ref 0–0)
PLATELET: 118 10*3/uL — ABNORMAL LOW (ref 140–450)
RBC: 4.32 M/uL (ref 3.80–5.70)
RDW-SD: 41.7 (ref 35.1–43.9)
WBC: 3.6 10*3/uL — ABNORMAL LOW (ref 4.0–11.0)

## 2018-07-29 LAB — METABOLIC PANEL, BASIC
Anion gap: 4 mmol/L — ABNORMAL LOW (ref 5–15)
BUN: 4 mg/dl — ABNORMAL LOW (ref 7–25)
CO2: 25 mEq/L (ref 21–32)
Calcium: 8 mg/dl — ABNORMAL LOW (ref 8.5–10.1)
Chloride: 112 mEq/L — ABNORMAL HIGH (ref 98–107)
Creatinine: 0.7 mg/dl (ref 0.6–1.3)
GFR est AA: >60
GFR est non-AA: >60
Glucose: 91 mg/dl (ref 74–106)
Potassium: 3.8 mEq/L (ref 3.5–5.1)
Sodium: 141 mEq/L (ref 136–145)

## 2018-07-29 MED FILL — OMEPRAZOLE 20 MG CAP, DELAYED RELEASE: 20 mg | ORAL | Qty: 2

## 2018-07-29 MED FILL — OXYCODONE-ACETAMINOPHEN 5 MG-325 MG TAB: 5-325 mg | ORAL | Qty: 1

## 2018-07-29 MED FILL — DICYCLOMINE 10 MG CAP: 10 mg | ORAL | Qty: 1

## 2018-07-29 MED FILL — LOPERAMIDE 2 MG CAP: 2 mg | ORAL | Qty: 1

## 2018-07-29 NOTE — Progress Notes (Signed)
Problem: Risk for Spread of Infection  Goal: Prevent transmission of infectious organism to others  Description: Prevent the transmission of infectious organisms to other patients, staff members, and visitors.  Outcome: Progressing Towards Goal     Problem: Falls - Risk of  Goal: *Absence of Falls  Description: Document Schmid Fall Risk and appropriate interventions in the flowsheet.  Outcome: Progressing Towards Goal  Note: Fall Risk Interventions:            Medication Interventions: Patient to call before getting OOB         History of Falls Interventions: Bed/chair exit alarm

## 2018-07-29 NOTE — Progress Notes (Signed)
A member of [facility] [place] [place]                 GI Progress Note    Patient:  Travis [name]  Date of Birth:  [birth year]  Age: 28 y.o.  Sex: male  PCP: None  MRN: [id number]                    Admit Date: 07/21/2018    Assessment:   1. Nausea, vomiting abdominal pain. CT with enteritis vs mild enterocolitis. ??Suspect likely infectious enteritis. ??Improving although still with lower abdominal pain.  ?? Cdiff, stool cult neg.   2. AKI. Improving.  3. Elevated transaminases.  CT with unremarkable liver.LFTS trending down.   ?? Viral pnl pending   ?? U/s RUQ-liver unremarkable, small amt perihepatic ascites  4. History of Bell's palsy    Plan:   ?? Diet as tol -bland   ?? F/u viral hepatitis panel   ?? Cont Bentyl for abd discomfort   ?? PPI,Antiemetics PRN  ?? If tol diet ok for D/c from GI perspective. F/u as OP w/ Dr. Zane Herald    Subjective:   Diarrhea less freq  Nausea persists but no vomiting now.  tol diet     Objective:   Physical Exam:  @VITALSINPATIENT @  Temp (24hrs), Avg:98.7 ??F (37.1 ??C), Min:98.1 ??F (36.7 ??C), Max:99.5 ??F (37.5 ??C)      Intake/Output Summary (Last 24 hours) at 07/29/2018 1146  Last data filed at 07/29/2018 0509  Gross per 24 hour   Intake 1526.83 ml   Output 300 ml   Net 1226.83 ml     GEN: NAD  LUNGS: CTA bilat  CV: s1s2  Abd: soft, ND, NT, NABS  labs    Recent Labs     07/29/18  0523 07/28/18  0502   WBC 3.6* 2.4*   RBC 4.32 4.29   HCT 36.8* 37.0   MCV 85.2 86.2   MCHC 33.4 32.7     Recent Labs     07/29/18  0523 07/28/18  0502   NA 141 140   CO2 25 25   BUN 4* 4*     Ayesha Mohair NP-C  [facility]  Cell # [phone]

## 2018-07-29 NOTE — Progress Notes (Signed)
Problem: Risk for Spread of Infection  Goal: Prevent transmission of infectious organism to others  Description: Prevent the transmission of infectious organisms to other patients, staff members, and visitors.  Outcome: Progressing Towards Goal     Problem: Patient Education:  Go to Education Activity  Goal: Patient/Family Education  Outcome: Progressing Towards Goal     Problem: Falls - Risk of  Goal: *Absence of Falls  Description: Document Schmid Fall Risk and appropriate interventions in the flowsheet.  Outcome: Progressing Towards Goal  Note: Fall Risk Interventions:            Medication Interventions: Patient to call before getting OOB, Teach patient to arise slowly         History of Falls Interventions: Bed/chair exit alarm         Problem: Patient Education: Go to Patient Education Activity  Goal: Patient/Family Education  Outcome: Progressing Towards Goal

## 2018-07-29 NOTE — Progress Notes (Signed)
Problem: Risk for Spread of Infection  Goal: Prevent transmission of infectious organism to others  Description: Prevent the transmission of infectious organisms to other patients, staff members, and visitors.  Outcome: Progressing Towards Goal

## 2018-07-29 NOTE — Progress Notes (Signed)
Medical Progress Note      NAME: Travis [name]   DOB:  08/17/1990  MRM:  [id number]    Date/Time: 07/29/2018  12:08 PM         Subjective:     Still CO abd pain.   US notable for perihepatic ascites. Otherwise unremarkable.     Past Medical History reviewed and unchanged from Admission History and Physical    Review of Systems   Constitutional: Positive for fatigue.   HENT: Negative.    Eyes: Negative.    Respiratory: Negative.    Cardiovascular: Negative.    Gastrointestinal: Positive for abdominal pain and nausea. Negative for vomiting.   Endocrine: Negative.    Genitourinary: Negative.    Musculoskeletal: Negative.    Skin: Negative.    Neurological: Negative.    Hematological: Negative.    Psychiatric/Behavioral: Negative.             Objective:       Vitals:      Last 24hrs VS reviewed since prior progress note. Most recent are:    Visit Vitals  BP 141/81 (BP 1 Location: Right arm, BP Patient Position: Supine)   Pulse 75   Temp 98.8 ??F (37.1 ??C)   Resp 10   Ht 5\' 9"  (1.753 m)   Wt 70.4 kg (155 lb 3.3 oz)   SpO2 100%   BMI 22.92 kg/m??     SpO2 Readings from Last 6 Encounters:   07/29/18 100%   04/10/18 99%   02/14/16 97%   01/27/16 99%   09/18/15 98%   06/05/15 99%            Intake/Output Summary (Last 24 hours) at 07/29/2018 1208  Last data filed at 07/29/2018 0509  Gross per 24 hour   Intake 1526.83 ml   Output 300 ml   Net 1226.83 ml          Exam:      Physical Exam  Vitals signs reviewed.   HENT:      Head: Normocephalic.      Mouth/Throat:      Mouth: Mucous membranes are moist.   Eyes:      Extraocular Movements: Extraocular movements intact.      Pupils: Pupils are equal, round, and reactive to light.   Neck:      Musculoskeletal: Normal range of motion.   Cardiovascular:      Rate and Rhythm: Normal rate.      Pulses: Normal pulses.   Pulmonary:      Effort: Pulmonary effort is normal.   Abdominal:      General: Abdomen is flat. Bowel sounds are normal.      Palpations: Abdomen is soft.       Tenderness: There is abdominal tenderness. There is no rebound.   Musculoskeletal: Normal range of motion.   Skin:     General: Skin is warm.   Neurological:      General: No focal deficit present.      Mental Status: He is alert.         Lab Data Reviewed: (see below)      Medications:  Current Facility-Administered Medications   Medication Dose Route Frequency   ??? dicyclomine (BENTYL) capsule 10 mg  10 mg Oral QID   ??? omeprazole (PRILOSEC) capsule 40 mg  40 mg Oral ACB   ??? dextrose 5% - 0.45% NaCl with KCl 20 mEq/L infusion  125 mL/hr IntraVENous CONTINUOUS   ??? loperamide (  IMODIUM) capsule 2 mg  2 mg Oral Q4H PRN   ??? oxyCODONE-acetaminophen (PERCOCET) 5-325 mg per tablet 1 Tab  1 Tab Oral Q4H PRN   ??? simethicone (MYLICON) tablet 80 mg  80 mg Oral QID PRN   ??? ondansetron (ZOFRAN) injection 4 mg  4 mg IntraVENous Q6H PRN   ??? sodium chloride (NS) flush 5-10 mL  5-10 mL IntraVENous Q8H   ??? sodium chloride (NS) flush 5-10 mL  5-10 mL IntraVENous PRN   ??? naloxone (NARCAN) injection 0.1 mg  0.1 mg IntraVENous PRN   ??? albuterol (PROVENTIL VENTOLIN) nebulizer solution 2.5 mg  2.5 mg Nebulization Q4H PRN       ______________________________________________________________________      Lab Review:     Recent Labs     07/29/18  0523 07/28/18  0502 07/27/18  0625   WBC 3.6* 2.4* 2.8*   HGB 12.3* 12.1* 12.0*   HCT 36.8* 37.0 36.0*   PLT 118* 119* 115*     Recent Labs     07/29/18  0523 07/28/18  0502 07/27/18  0625   NA 141 140 141   K 3.8 3.7 3.6   CL 112* 111* 117*   CO2 25 25 22    GLU 91 85 97   BUN 4* 4* 2*   CREA 0.7 0.7 0.6   CA 8.0* 7.8* 7.9*   ALB 2.8* 2.7* 2.7*   ALT 172* 180* 179*          Assessment:     Active Problems:    AKI (acute kidney injury) (HCC) (07/22/2018)           Plan:     Risk of deterioration: medium             1. Intractable nausea and vomiting with abd pain. GI consult in progress. Stool studies in progress  2. Acute renal failure due to pre-renal state due to dehydration  3. Enteritis.    4. HTN  5. Ashtma  6. Hx Bell's palsey  7. Severe protein calorie malnutrition  8. Hypokalemia  9. Mild thrombocytopenia.         Total time spent with patient: 30 Minutes                  Care Plan discussed with: Patient, Care Manager, Nursing Staff, Consultant/Specialist and >50% of time spent in counseling and coordination of care    Discussed:  Care Plan    Prophylaxis:  SCD's    Disposition:  Home w/Family           ___________________________________________________    Attending Physician: Marlene Bast, MD

## 2018-07-29 NOTE — Progress Notes (Signed)
Medical Progress Note      NAME: Travis [name]   DOB:  October 25, 1990  MRM:  [id number]    Date/Time: 07/29/2018  12:08 PM         Subjective:     Still CO abd pain.   [place] notable for perihepatic ascites. Otherwise unremarkable.     Past Medical History reviewed and unchanged from Admission History and Physical    Review of Systems   Constitutional: Positive for fatigue.   HENT: Negative.    Eyes: Negative.    Respiratory: Negative.    Cardiovascular: Negative.    Gastrointestinal: Positive for abdominal pain and nausea. Negative for vomiting.   Endocrine: Negative.    Genitourinary: Negative.    Musculoskeletal: Negative.    Skin: Negative.    Neurological: Negative.    Hematological: Negative.    Psychiatric/Behavioral: Negative.             Objective:       Vitals:      Last 24hrs VS reviewed since prior progress note. Most recent are:    Visit Vitals  BP 141/81 (BP 1 Location: Right arm, BP Patient Position: Supine)   Pulse 75   Temp 98.8 ??F (37.1 ??C)   Resp 10   Ht 5\' 9"  (1.753 m)   Wt 70.4 kg (155 lb 3.3 oz)   SpO2 100%   BMI 22.92 kg/m??     SpO2 Readings from Last 6 Encounters:   07/29/18 100%   04/10/18 99%   02/14/16 97%   01/27/16 99%   09/18/15 98%   06/05/15 99%            Intake/Output Summary (Last 24 hours) at 07/29/2018 1208  Last data filed at 07/29/2018 0509  Gross per 24 hour   Intake 1526.83 ml   Output 300 ml   Net 1226.83 ml          Exam:      Physical Exam  Vitals signs reviewed.   HENT:      Head: Normocephalic.      Mouth/Throat:      Mouth: Mucous membranes are moist.   Eyes:      Extraocular Movements: Extraocular movements intact.      Pupils: Pupils are equal, round, and reactive to light.   Neck:      Musculoskeletal: Normal range of motion.   Cardiovascular:      Rate and Rhythm: Normal rate.      Pulses: Normal pulses.   Pulmonary:      Effort: Pulmonary effort is normal.   Abdominal:      General: Abdomen is flat. Bowel sounds are normal.      Palpations: Abdomen is soft.      Tenderness: There is  abdominal tenderness. There is no rebound.   Musculoskeletal: Normal range of motion.   Skin:     General: Skin is warm.   Neurological:      General: No focal deficit present.      Mental Status: He is alert.         Lab Data Reviewed: (see below)      Medications:  Current Facility-Administered Medications   Medication Dose Route Frequency   ??? dicyclomine (BENTYL) capsule 10 mg  10 mg Oral QID   ??? omeprazole (PRILOSEC) capsule 40 mg  40 mg Oral ACB   ??? dextrose 5% - 0.45% NaCl with KCl 20 mEq/L infusion  125 mL/hr IntraVENous CONTINUOUS   ??? loperamide (  IMODIUM) capsule 2 mg  2 mg Oral Q4H PRN   ??? oxyCODONE-acetaminophen (PERCOCET) 5-325 mg per tablet 1 Tab  1 Tab Oral Q4H PRN   ??? simethicone (MYLICON) tablet 80 mg  80 mg Oral QID PRN   ??? ondansetron (ZOFRAN) injection 4 mg  4 mg IntraVENous Q6H PRN   ??? sodium chloride (NS) flush 5-10 mL  5-10 mL IntraVENous Q8H   ??? sodium chloride (NS) flush 5-10 mL  5-10 mL IntraVENous PRN   ??? naloxone (NARCAN) injection 0.1 mg  0.1 mg IntraVENous PRN   ??? albuterol (PROVENTIL VENTOLIN) nebulizer solution 2.5 mg  2.5 mg Nebulization Q4H PRN       ______________________________________________________________________      Lab Review:     Recent Labs     07/29/18  0523 07/28/18  0502 07/27/18  0625   WBC 3.6* 2.4* 2.8*   HGB 12.3* 12.1* 12.0*   HCT 36.8* 37.0 36.0*   PLT 118* 119* 115*     Recent Labs     07/29/18  0523 07/28/18  0502 07/27/18  0625   NA 141 140 141   K 3.8 3.7 3.6   CL 112* 111* 117*   CO2 25 25 22    GLU 91 85 97   BUN 4* 4* 2*   CREA 0.7 0.7 0.6   CA 8.0* 7.8* 7.9*   ALB 2.8* 2.7* 2.7*   ALT 172* 180* 179*          Assessment:     Active Problems:    AKI (acute kidney injury) (HCC) (07/22/2018)           Plan:     Risk of deterioration: medium             1. Intractable nausea and vomiting with abd pain. GI consult in progress. Stool studies in progress  2. Acute renal failure due to pre-renal state due to dehydration  3. Enteritis.   4. HTN  5. Ashtma  6. Hx  Bell's palsey  7. Severe protein calorie malnutrition  8. Hypokalemia  9. Mild thrombocytopenia.         Total time spent with patient: 30 Minutes                  Care Plan discussed with: Patient, Care Manager, Nursing Staff, Consultant/Specialist and >50% of time spent in counseling and coordination of care    Discussed:  Care Plan    Prophylaxis:  SCD's    Disposition:  Home w/Family           ___________________________________________________    Attending Physician: Marlene BastJeffrey G Kaylum Shrum, MD

## 2018-07-29 NOTE — Progress Notes (Signed)
Progress Notes by Shelba FlakeBrownell, Greyden Besecker A, NP at 07/29/18 1145                Author: Shelba FlakeBrownell, Anays Detore A, NP  Service: Gastroenterology  Author Type: Nurse Practitioner       Filed: 07/29/18 1210  Date of Service: 07/29/18 1145  Status: Attested           Editor: Shelba FlakeBrownell, Tema Alire A, NP (Nurse Practitioner)  Cosigner: Jonna CoupPayman, Gary H, MD at 07/29/18 1557          Attestation signed by Jonna CoupPayman, Gary H, MD at 07/29/18 1557          Nausea without vomiting   Less frequent diarrhea   Still with lower abdominal pain   LFTs trending down   Await viral hepatitis panel   Continue Bentyl   Continue PPI      Larence PenningGary H. Zane HeraldPayman, M.D.                                          A member of [facility] [place]                   GI Progress Note      Patient:  Travis [name]   Date of Birth:  11/05/1990   Age: 28 y.o.   Sex: male   PCP: None   MRN: [id number]                      Admit Date: 07/21/2018        Assessment:     1.  Nausea, vomiting abdominal pain. CT with enteritis vs mild enterocolitis. ??Suspect likely infectious enteritis. ??Improving although  still with lower abdominal pain.   ??  Cdiff, stool cult neg.    2.  AKI. Improving.   3.  Elevated transaminases.  CT with unremarkable liver.LFTS trending down.    ??  Viral pnl pending    ??  U/s RUQ-liver unremarkable, small amt perihepatic ascites   4.  History of Bell's palsy        Plan:     ??  Diet as tol -bland    ??  F/u viral hepatitis panel    ??  Cont Bentyl for abd discomfort    ??  PPI,Antiemetics PRN   ??  If tol diet ok for D/c from GI perspective. F/u as OP w/ Dr. Zane HeraldPayman        Subjective:     Diarrhea less freq   Nausea persists but no vomiting now.   tol diet         Objective:     Physical Exam:   @VITALSINPATIENT @   Temp (24hrs), Avg:98.7 ??F (37.1 ??C), Min:98.1 ??F (36.7 ??C), Max:99.5 ??F (37.5 ??C)         Intake/Output Summary (Last 24 hours) at 07/29/2018 1146   Last data filed at 07/29/2018 0509     Gross per 24 hour        Intake   1526.83 ml        Output  300 ml        Net  1226.83 ml        GEN: NAD   LUNGS: CTA bilat   CV: s1s2   Abd: soft, ND, NT, NABS   labs  Recent Labs            07/29/18   0523  07/28/18   0502     WBC  3.6*  2.4*     RBC  4.32  4.29     HCT  36.8*  37.0     MCV  85.2  86.2         MCHC  33.4  32.7          Recent Labs            07/29/18   0523  07/28/18   0502     NA  141  140     CO2  25  25         BUN  4*  4*        Ayesha Mohair NP-C   [facility]   Cell # [phone]

## 2018-07-30 ENCOUNTER — Inpatient Hospital Stay: Admit: 2018-07-30 | Payer: MEDICAID | Primary: Family Medicine

## 2018-07-30 LAB — BASIC METABOLIC PANEL
Anion Gap: 3 mmol/L — ABNORMAL LOW (ref 5–15)
BUN: 4 mg/dl — ABNORMAL LOW (ref 7–25)
CO2: 26 mEq/L (ref 21–32)
Calcium: 7.9 mg/dl — ABNORMAL LOW (ref 8.5–10.1)
Chloride: 111 mEq/L — ABNORMAL HIGH (ref 98–107)
Creatinine: 0.7 mg/dl (ref 0.6–1.3)
EGFR IF NonAfrican American: >60
GFR African American: >60
Glucose: 88 mg/dl (ref 74–106)
Potassium: 3.9 mEq/L (ref 3.5–5.1)
Sodium: 140 mEq/L (ref 136–145)

## 2018-07-30 LAB — CBC WITH AUTO DIFFERENTIAL
Atypical Lymphocytes: 9.5 % — ABNORMAL HIGH (ref 0–0)
Basophils %: 1 % (ref 0–3)
Eosinophils %: 0 % (ref 0–5)
Hematocrit: 35.6 % — ABNORMAL LOW (ref 37.0–50.0)
Hemoglobin: 11.9 gm/dl — ABNORMAL LOW (ref 12.4–17.2)
Lymphocytes %: 17.1 % — ABNORMAL LOW (ref 28–48)
MCH: 28.3 pg (ref 23.0–34.6)
MCHC: 33.4 gm/dl (ref 30.0–36.0)
MCV: 84.6 fL (ref 80.0–98.0)
MPV: 13.4 fL — ABNORMAL HIGH (ref 6.0–10.0)
Metamyelocytes Relative: 1 % — ABNORMAL HIGH (ref 0–0)
Monocytes %: 11.4 % (ref 1–13)
Myelocyte Percent: 4.8 % — ABNORMAL HIGH (ref 0–0)
Neutrophils %: 55.2 % (ref 34–64)
Nucleated RBCs: 0 (ref 0–0)
Platelet Comment: DECREASED
Platelets: 113 10*3/uL — ABNORMAL LOW (ref 140–450)
RBC: 4.21 M/uL (ref 3.80–5.70)
RDW-SD: 41.5 (ref 35.1–43.9)
WBC: 2.6 10*3/uL — ABNORMAL LOW (ref 4.0–11.0)

## 2018-07-30 LAB — METABOLIC PANEL, BASIC
Anion gap: 3 mmol/L — ABNORMAL LOW (ref 5–15)
BUN: 4 mg/dl — ABNORMAL LOW (ref 7–25)
CO2: 26 mEq/L (ref 21–32)
Calcium: 7.9 mg/dl — ABNORMAL LOW (ref 8.5–10.1)
Chloride: 111 mEq/L — ABNORMAL HIGH (ref 98–107)
Creatinine: 0.7 mg/dl (ref 0.6–1.3)
GFR est AA: >60
GFR est non-AA: >60
Glucose: 88 mg/dl (ref 74–106)
Potassium: 3.9 mEq/L (ref 3.5–5.1)
Sodium: 140 mEq/L (ref 136–145)

## 2018-07-30 LAB — CBC WITH AUTOMATED DIFF
ATYPICAL LYMPHS: 9.5 % — ABNORMAL HIGH (ref 0–0)
BASOPHILS: 1 % (ref 0–3)
EOSINOPHILS: 0 % (ref 0–5)
HCT: 35.6 % — ABNORMAL LOW (ref 37.0–50.0)
HGB: 11.9 gm/dl — ABNORMAL LOW (ref 12.4–17.2)
LYMPHOCYTES: 17.1 % — ABNORMAL LOW (ref 28–48)
MCH: 28.3 pg (ref 23.0–34.6)
MCHC: 33.4 gm/dl (ref 30.0–36.0)
MCV: 84.6 fL (ref 80.0–98.0)
METAMYELOCYTES: 1 % — ABNORMAL HIGH (ref 0–0)
MONOCYTES: 11.4 % (ref 1–13)
MPV: 13.4 fL — ABNORMAL HIGH (ref 6.0–10.0)
MYELOCYTES: 4.8 % — ABNORMAL HIGH (ref 0–0)
NEUTROPHILS: 55.2 % (ref 34–64)
NRBC: 0 (ref 0–0)
PLATELET COMMENTS: DECREASED
PLATELET: 113 10*3/uL — ABNORMAL LOW (ref 140–450)
RBC: 4.21 M/uL (ref 3.80–5.70)
RDW-SD: 41.5 (ref 35.1–43.9)
WBC: 2.6 10*3/uL — ABNORMAL LOW (ref 4.0–11.0)

## 2018-07-30 LAB — CULTURE, STOOL FOR VIBRIO

## 2018-07-30 MED ORDER — METHYLNALTREXONE 12 MG/0.6 ML SUBCUTANEOUS SYRINGE
12 mg/0.6 mL | Freq: Every day | SUBCUTANEOUS | Status: DC | PRN
Start: 2018-07-30 — End: 2018-07-31

## 2018-07-30 MED FILL — BD POSIFLUSH NORMAL SALINE 0.9 % INJECTION SYRINGE: INTRAMUSCULAR | Qty: 10

## 2018-07-30 MED FILL — DICYCLOMINE 10 MG CAP: 10 mg | ORAL | Qty: 1

## 2018-07-30 MED FILL — OMEPRAZOLE 20 MG CAP, DELAYED RELEASE: 20 mg | ORAL | Qty: 2

## 2018-07-30 MED FILL — GAS-X 80 MG CHEWABLE TABLET: 80 mg | ORAL | Qty: 1

## 2018-07-30 MED FILL — OXYCODONE-ACETAMINOPHEN 5 MG-325 MG TAB: 5-325 mg | ORAL | Qty: 1

## 2018-07-30 MED FILL — RELISTOR 12 MG/0.6 ML SUBCUTANEOUS SYRINGE: 12 mg/0.6 mL | SUBCUTANEOUS | Qty: 1

## 2018-07-30 NOTE — Progress Notes (Signed)
Problem: Falls - Risk of  Goal: *Absence of Falls  Description: Document Schmid Fall Risk and appropriate interventions in the flowsheet.  07/30/2018 2057 by Eze, Everest Amaefuna  Outcome: Progressing Towards Goal  Note: Fall Risk Interventions:            Medication Interventions: Bed/chair exit alarm, Patient to call before getting OOB         History of Falls Interventions: Bed/chair exit alarm      07/30/2018 2056 by Eze, Everest Amaefuna  Outcome: Progressing Towards Goal  Note: Fall Risk Interventions:            Medication Interventions: Bed/chair exit alarm, Patient to call before getting OOB         History of Falls Interventions: Bed/chair exit alarm         Problem: Patient Education: Go to Patient Education Activity  Goal: Patient/Family Education  07/30/2018 2057 by Eze, Everest Amaefuna  Outcome: Progressing Towards Goal  07/30/2018 2056 by Eze, Everest Amaefuna  Outcome: Progressing Towards Goal

## 2018-07-30 NOTE — Progress Notes (Signed)
Medical Progress Note      NAME: Travis [name]   DOB:  Aug 22, 1990  MRM:  [id number]    Date/Time: 07/30/2018  12:51 PM         Subjective:     Still CO abd pain. No more diarrhea. Today he feels like he needs to have a BM but can't. A KUB has been ordered by GI and pending.     Past Medical History reviewed and unchanged from Admission History and Physical    Review of Systems   Constitutional: Negative.    HENT: Negative.    Eyes: Negative.    Respiratory: Negative.    Cardiovascular: Negative.    Gastrointestinal: Positive for abdominal distention, abdominal pain and constipation. Negative for blood in stool, diarrhea, nausea and vomiting.   Endocrine: Negative.    Genitourinary: Negative.    Musculoskeletal: Negative.    Skin: Negative.    Neurological: Negative.    Hematological: Negative.    Psychiatric/Behavioral: Negative.             Objective:       Vitals:      Last 24hrs VS reviewed since prior progress note. Most recent are:    Visit Vitals  BP 127/69 (BP 1 Location: Right arm, BP Patient Position: Supine)   Pulse 72   Temp 99 ??F (37.2 ??C)   Resp 20   Ht 5\' 9"  (1.753 m)   Wt 71 kg (156 lb 8.4 oz)   SpO2 100%   BMI 23.11 kg/m??     SpO2 Readings from Last 6 Encounters:   07/30/18 100%   04/10/18 99%   02/14/16 97%   01/27/16 99%   09/18/15 98%   06/05/15 99%            Intake/Output Summary (Last 24 hours) at 07/30/2018 1251  Last data filed at 07/30/2018 0604  Gross per 24 hour   Intake 3114.58 ml   Output ???   Net 3114.58 ml          Exam:      Physical Exam  Vitals signs reviewed.   Constitutional:       General: He is not in acute distress.     Appearance: Normal appearance.   HENT:      Head: Normocephalic.      Mouth/Throat:      Mouth: Mucous membranes are moist.   Eyes:      Pupils: Pupils are equal, round, and reactive to light.   Neck:      Musculoskeletal: Normal range of motion.   Cardiovascular:      Rate and Rhythm: Normal rate and regular rhythm.   Pulmonary:      Effort: Pulmonary effort is normal.       Breath sounds: Normal breath sounds.   Abdominal:      General: Bowel sounds are normal.      Palpations: Abdomen is soft.   Musculoskeletal: Normal range of motion.   Skin:     General: Skin is warm.   Neurological:      General: No focal deficit present.      Mental Status: He is alert.         Lab Data Reviewed: (see below)      Medications:  Current Facility-Administered Medications   Medication Dose Route Frequency   ??? dicyclomine (BENTYL) capsule 10 mg  10 mg Oral QID   ??? omeprazole (PRILOSEC) capsule 40 mg  40 mg Oral ACB   ???  dextrose 5% - 0.45% NaCl with KCl 20 mEq/L infusion  125 mL/hr IntraVENous CONTINUOUS   ??? loperamide (IMODIUM) capsule 2 mg  2 mg Oral Q4H PRN   ??? oxyCODONE-acetaminophen (PERCOCET) 5-325 mg per tablet 1 Tab  1 Tab Oral Q4H PRN   ??? simethicone (MYLICON) tablet 80 mg  80 mg Oral QID PRN   ??? ondansetron (ZOFRAN) injection 4 mg  4 mg IntraVENous Q6H PRN   ??? sodium chloride (NS) flush 5-10 mL  5-10 mL IntraVENous Q8H   ??? sodium chloride (NS) flush 5-10 mL  5-10 mL IntraVENous PRN   ??? naloxone (NARCAN) injection 0.1 mg  0.1 mg IntraVENous PRN   ??? albuterol (PROVENTIL VENTOLIN) nebulizer solution 2.5 mg  2.5 mg Nebulization Q4H PRN       ______________________________________________________________________      Lab Review:     Recent Labs     07/30/18  0657 07/29/18  0523 07/28/18  0502   WBC 2.6* 3.6* 2.4*   HGB 11.9* 12.3* 12.1*   HCT 35.6* 36.8* 37.0   PLT 113* 118* 119*     Recent Labs     07/30/18  0657 07/29/18  0523 07/28/18  0502   NA 140 141 140   K 3.9 3.8 3.7   CL 111* 112* 111*   CO2 26 25 25    GLU 88 91 85   BUN 4* 4* 4*   CREA 0.7 0.7 0.7   CA 7.9* 8.0* 7.8*   ALB  --  2.8* 2.7*   ALT  --  172* 180*        Assessment:     Active Problems:    AKI (acute kidney injury) (HCC) (07/22/2018)           Plan:     Risk of deterioration: medium             1. Intractable nausea and vomtiing with abd pain. Stool studies negative for Cdiff and neg cultures   2. Elevated LFT's.viral hepatitis panel negative. LFT's improving  3. Dairrhea resolved  4. Now with constipation. abd Xrays pending. Continue symptom management. DC narcotics. Will give a dose of relistor  5. Acute renal failure due to pre-renal state improved with [name] fluids  6. Enteritris. Continue symptom management. GI consult in progress  7. HTN  8. Asthma  9. Hx Bell's palsey  10. Severe protein calorie malnutrition  11. Hypokalemia due to vomiting. Improved  12. Mild thrombocytopenia. Stable No acute issues. No need for transfusion. Non-critical.          Total time spent with patient: 30 Minutes                  Care Plan discussed with: Patient, Care Manager, Nursing Staff, Consultant/Specialist and >50% of time spent in counseling and coordination of care    Discussed:  Care Plan and D/C Planning    Prophylaxis:  SCD's    Disposition:  Home w/Family           ___________________________________________________    Attending Physician: Marlene BastJeffrey G Wlliam Grosso, MD

## 2018-07-30 NOTE — Other (Signed)
Bedside and Verbal shift change report given to Idell Pickles, RN   (Cabin crew) by Katheran Awe, Charity fundraiser (offgoing nurse). Report included the following information SBAR, Kardex, MAR and Recent Results.

## 2018-07-30 NOTE — Progress Notes (Signed)
A member of [facility] [place] [place]                 GI Progress Note    Patient:  Travis [name]  Date of Birth:  1990/04/12  Age: 28 y.o.  Sex: male  PCP: None  MRN: [id number]                    Admit Date: 07/21/2018    Assessment:   1. Nausea, vomiting abdominal pain. CT with enteritis vs mild enterocolitis. ??Suspect likely infectious enteritis. ??Improving although still with??lower abdominal pain.  ?? Cdiff, stool cult neg  2. AKI. Improving.  3. Elevated transaminases. ??CT with unremarkable liver.LFTS trending down.   ?? Viral pnl NEG   ?? U/s RUQ-liver unremarkable, small amt perihepatic ascites  4. History of Bell's palsy    Plan:   ?? Diet as tol -bland   ?? Cont Bentyl for abd discomfort   ?? PPI,Antiemetics PRN  ?? OOB  ?? abd xray-r/o ileus  ?? If abd xray unremarkable, ok for D/c from GI perspective. F/u as OP w/ Dr. Zane Herald    Subjective:   Cont to have lower abd discomfort. Reports he is now not passing flatus or having BM  No N/v, tol diet   Objective:   Physical Exam:  Visit Vitals  BP 135/78 (BP 1 Location: Left arm, BP Patient Position: Supine)   Pulse 78   Temp 98.8 ??F (37.1 ??C)   Resp 20   Ht 5\' 9"  (1.753 m)   Wt 71 kg (156 lb 8.4 oz)   SpO2 99%   BMI 23.11 kg/m??      Temp (24hrs), Avg:98.8 ??F (37.1 ??C), Min:98.2 ??F (36.8 ??C), Max:99.5 ??F (37.5 ??C)      Intake/Output Summary (Last 24 hours) at 07/30/2018 0855  Last data filed at 07/30/2018 0604  Gross per 24 hour   Intake 3114.58 ml   Output ???   Net 3114.58 ml     GEN: NAD, sitting up  LUNGS: CTA bilta, nonlabored  CV: s1s2  Abd: soft, ND, scattered bowel sounds. Tender w/ palp-no rebound  labs    Recent Labs     07/30/18  0657 07/29/18  0523   WBC 2.6* 3.6*   RBC 4.21 4.32   HCT 35.6* 36.8*   MCV 84.6 85.2   MCHC 33.4 33.4     Recent Labs     07/30/18  0657 07/29/18  0523   NA 140 141   CO2 26 25   BUN 4* 4*     Ayesha Mohair NP-C  [facility]  Cell # [phone]

## 2018-07-30 NOTE — Progress Notes (Signed)
Progress Notes by Shelba FlakeBrownell, Terryn Redner A, NP at 07/30/18 [phone]                Author: Shelba FlakeBrownell, Easter Schinke A, NP  Service: Gastroenterology  Author Type: Nurse Practitioner       Filed: 07/30/18 0912  Date of Service: 07/30/18 0855  Status: Attested           Editor: Shelba FlakeBrownell, Kylene Zamarron A, NP (Nurse Practitioner)  Cosigner: Larene BeachSperling, Michael H, MD at 07/30/18 1621          Attestation signed by Larene BeachSperling, Michael H, MD at 07/30/18 1621          Still has lower abdominal discomfort and passed a few drops of blood    Stool vibrio pending would check Noro virus   Flex sig if rebleeds                                         A member of [facility]                   GI Progress Note      Patient:  Travis [name]   Date of Birth:  09/07/1990   Age: 28 y.o.   Sex: male   PCP: None   MRN: [id number]                      Admit Date: 07/21/2018        Assessment:     1.  Nausea, vomiting abdominal pain. CT with enteritis vs mild enterocolitis. ??Suspect likely infectious enteritis. ??Improving although  still with??lower abdominal pain.   ??  Cdiff, stool cult neg   2.  AKI. Improving.   3.  Elevated transaminases. ??CT with unremarkable liver.LFTS trending down.    ??  Viral pnl NEG    ??  U/s RUQ-liver unremarkable, small amt perihepatic ascites   4.  History of Bell's palsy        Plan:     ??  Diet as tol -bland    ??  Cont Bentyl for abd discomfort    ??  PPI,Antiemetics PRN   ??  OOB   ??  abd xray-r/o ileus   ??  If abd xray unremarkable, ok for D/c from GI perspective. F/u as OP w/ Dr. Zane HeraldPayman        Subjective:     Cont to have lower abd discomfort. Reports he is now not passing flatus or having BM   No N/v, tol diet      Objective:     Physical Exam:   Visit Vitals      BP  135/78 (BP 1 Location: Left arm, BP Patient Position: Supine)        Pulse  78     Temp  98.8 ??F (37.1 ??C)     Resp  20     Ht  5\' 9"  (1.753 m)     Wt  71 kg (156 lb 8.4 oz)     SpO2  99%        BMI  23.11 kg/m??          Temp (24hrs), Avg:98.8 ??F (37.1 ??C), Min:98.2 ??F (36.8 ??C), Max:99.5 ??F (37.5 ??C)         Intake/Output Summary (Last 24 hours) at 07/30/2018 0855   Last data  filed at 07/30/2018 0604     Gross per 24 hour        Intake  3114.58 ml        Output  --        Net  3114.58 ml        GEN: NAD, sitting up   LUNGS: CTA bilta, nonlabored   CV: s1s2   Abd: soft, ND, scattered bowel sounds. Tender w/ palp-no rebound   labs        Recent Labs            07/30/18   0657  07/29/18   0523     WBC  2.6*  3.6*     RBC  4.21  4.32     HCT  35.6*  36.8*     MCV  84.6  85.2         MCHC  33.4  33.4          Recent Labs            07/30/18   0657  07/29/18   0523     NA  140  141     CO2  26  25         BUN  4*  4*        Ayesha Mohair NP-C   [facility]   Cell # [phone]

## 2018-07-30 NOTE — Progress Notes (Signed)
Problem: Falls - Risk of  Goal: *Absence of Falls  Description: Document Travis [name] Fall Risk and appropriate interventions in the flowsheet.  07/30/2018 2057 by Corky Mull  Outcome: Progressing Towards Goal  Note: Fall Risk Interventions:            Medication Interventions: Bed/chair exit alarm, Patient to call before getting OOB         History of Falls Interventions: Bed/chair exit alarm      07/30/2018 2056 by Eze, Everest Amaefuna  Outcome: Progressing Towards Goal  Note: Fall Risk Interventions:            Medication Interventions: Bed/chair exit alarm, Patient to call before getting OOB         History of Falls Interventions: Bed/chair exit alarm         Problem: Patient Education: Go to Patient Education Activity  Goal: Patient/Family Education  07/30/2018 2057 by Corky Mull  Outcome: Progressing Towards Goal  07/30/2018 2056 by Corky Mull  Outcome: Progressing Towards Goal

## 2018-07-30 NOTE — Progress Notes (Signed)
Medical Progress Note      NAME: Travis [name]   DOB:  1991-02-25  MRM:  [id number]    Date/Time: 07/30/2018  12:51 PM         Subjective:     Still CO abd pain. No more diarrhea. Today he feels like he needs to have a BM but can't. A KUB has been ordered by GI and pending.     Past Medical History reviewed and unchanged from Admission History and Physical    Review of Systems   Constitutional: Negative.    HENT: Negative.    Eyes: Negative.    Respiratory: Negative.    Cardiovascular: Negative.    Gastrointestinal: Positive for abdominal distention, abdominal pain and constipation. Negative for blood in stool, diarrhea, nausea and vomiting.   Endocrine: Negative.    Genitourinary: Negative.    Musculoskeletal: Negative.    Skin: Negative.    Neurological: Negative.    Hematological: Negative.    Psychiatric/Behavioral: Negative.             Objective:       Vitals:      Last 24hrs VS reviewed since prior progress note. Most recent are:    Visit Vitals  BP 127/69 (BP 1 Location: Right arm, BP Patient Position: Supine)   Pulse 72   Temp 99 ??F (37.2 ??C)   Resp 20   Ht 5\' 9"  (1.753 m)   Wt 71 kg (156 lb 8.4 oz)   SpO2 100%   BMI 23.11 kg/m??     SpO2 Readings from Last 6 Encounters:   07/30/18 100%   04/10/18 99%   02/14/16 97%   01/27/16 99%   09/18/15 98%   06/05/15 99%            Intake/Output Summary (Last 24 hours) at 07/30/2018 1251  Last data filed at 07/30/2018 0604  Gross per 24 hour   Intake 3114.58 ml   Output ???   Net 3114.58 ml          Exam:      Physical Exam  Vitals signs reviewed.   Constitutional:       General: He is not in acute distress.     Appearance: Normal appearance.   HENT:      Head: Normocephalic.      Mouth/Throat:      Mouth: Mucous membranes are moist.   Eyes:      Pupils: Pupils are equal, round, and reactive to light.   Neck:      Musculoskeletal: Normal range of motion.   Cardiovascular:      Rate and Rhythm: Normal rate and regular rhythm.   Pulmonary:      Effort: Pulmonary effort is normal.       Breath sounds: Normal breath sounds.   Abdominal:      General: Bowel sounds are normal.      Palpations: Abdomen is soft.   Musculoskeletal: Normal range of motion.   Skin:     General: Skin is warm.   Neurological:      General: No focal deficit present.      Mental Status: He is alert.         Lab Data Reviewed: (see below)      Medications:  Current Facility-Administered Medications   Medication Dose Route Frequency   ??? dicyclomine (BENTYL) capsule 10 mg  10 mg Oral QID   ??? omeprazole (PRILOSEC) capsule 40 mg  40 mg Oral ACB   ???  dextrose 5% - 0.45% NaCl with KCl 20 mEq/L infusion  125 mL/hr IntraVENous CONTINUOUS   ??? loperamide (IMODIUM) capsule 2 mg  2 mg Oral Q4H PRN   ??? oxyCODONE-acetaminophen (PERCOCET) 5-325 mg per tablet 1 Tab  1 Tab Oral Q4H PRN   ??? simethicone (MYLICON) tablet 80 mg  80 mg Oral QID PRN   ??? ondansetron (ZOFRAN) injection 4 mg  4 mg IntraVENous Q6H PRN   ??? sodium chloride (NS) flush 5-10 mL  5-10 mL IntraVENous Q8H   ??? sodium chloride (NS) flush 5-10 mL  5-10 mL IntraVENous PRN   ??? naloxone (NARCAN) injection 0.1 mg  0.1 mg IntraVENous PRN   ??? albuterol (PROVENTIL VENTOLIN) nebulizer solution 2.5 mg  2.5 mg Nebulization Q4H PRN       ______________________________________________________________________      Lab Review:     Recent Labs     07/30/18  0657 07/29/18  0523 07/28/18  0502   WBC 2.6* 3.6* 2.4*   HGB 11.9* 12.3* 12.1*   HCT 35.6* 36.8* 37.0   PLT 113* 118* 119*     Recent Labs     07/30/18  0657 07/29/18  0523 07/28/18  0502   NA 140 141 140   K 3.9 3.8 3.7   CL 111* 112* 111*   CO2 26 25 25    GLU 88 91 85   BUN 4* 4* 4*   CREA 0.7 0.7 0.7   CA 7.9* 8.0* 7.8*   ALB  --  2.8* 2.7*   ALT  --  172* 180*        Assessment:     Active Problems:    AKI (acute kidney injury) (HCC) (07/22/2018)           Plan:     Risk of deterioration: medium             1. Intractable nausea and vomtiing with abd pain. Stool studies negative for Cdiff and neg cultures  2. Elevated LFT's.viral  hepatitis panel negative. LFT's improving  3. Dairrhea resolved  4. Now with constipation. abd Xrays pending. Continue symptom management. DC narcotics. Will give a dose of relistor  5. Acute renal failure due to pre-renal state improved with [name] fluids  6. Enteritris. Continue symptom management. GI consult in progress  7. HTN  8. Asthma  9. Hx Bell's palsey  10. Severe protein calorie malnutrition  11. Hypokalemia due to vomiting. Improved  12. Mild thrombocytopenia. Stable No acute issues. No need for transfusion. Non-critical.          Total time spent with patient: 30 Minutes                  Care Plan discussed with: Patient, Care Manager, Nursing Staff, Consultant/Specialist and >50% of time spent in counseling and coordination of care    Discussed:  Care Plan and D/C Planning    Prophylaxis:  SCD's    Disposition:  Home w/Family           ___________________________________________________    Attending Physician: Marlene BastJeffrey G Kory Rains, MD

## 2018-07-31 LAB — BASIC METABOLIC PANEL
Anion Gap: 3 mmol/L — ABNORMAL LOW (ref 5–15)
BUN: 5 mg/dl — ABNORMAL LOW (ref 7–25)
CO2: 26 mEq/L (ref 21–32)
Calcium: 8.3 mg/dl — ABNORMAL LOW (ref 8.5–10.1)
Chloride: 111 mEq/L — ABNORMAL HIGH (ref 98–107)
Creatinine: 0.6 mg/dl (ref 0.6–1.3)
EGFR IF NonAfrican American: >60
GFR African American: >60
Glucose: 82 mg/dl (ref 74–106)
Potassium: 3.8 mEq/L (ref 3.5–5.1)
Sodium: 139 mEq/L (ref 136–145)

## 2018-07-31 LAB — CBC WITH AUTO DIFFERENTIAL
Atypical Lymphocytes: 7.8 % — ABNORMAL HIGH (ref 0–0)
Band Neutrophils: 1.9 % (ref 0–11)
Basophils %: 0 % (ref 0–3)
Eosinophils %: 5.8 % — ABNORMAL HIGH (ref 0–5)
Hematocrit: 36.1 % — ABNORMAL LOW (ref 37.0–50.0)
Hemoglobin: 12 gm/dl — ABNORMAL LOW (ref 12.4–17.2)
Lymphocytes %: 19.4 % — ABNORMAL LOW (ref 28–48)
MCH: 28.2 pg (ref 23.0–34.6)
MCHC: 33.2 gm/dl (ref 30.0–36.0)
MCV: 84.7 fL (ref 80.0–98.0)
MPV: 13 fL — ABNORMAL HIGH (ref 6.0–10.0)
Monocytes %: 14.6 % — ABNORMAL HIGH (ref 1–13)
Myelocyte Percent: 1.9 % — ABNORMAL HIGH (ref 0–0)
Neutrophils %: 48.6 % (ref 34–64)
Nucleated RBCs: 0 (ref 0–0)
Platelet Comment: DECREASED
Platelets: 115 10*3/uL — ABNORMAL LOW (ref 140–450)
RBC: 4.26 M/uL (ref 3.80–5.70)
RDW-SD: 41.1 (ref 35.1–43.9)
WBC: 3.1 10*3/uL — ABNORMAL LOW (ref 4.0–11.0)

## 2018-07-31 LAB — MAGNESIUM
Magnesium: 1.5 mg/dl — ABNORMAL LOW (ref 1.6–2.6)
Magnesium: 1.5 mg/dl — ABNORMAL LOW (ref 1.6–2.6)

## 2018-07-31 LAB — CBC WITH AUTOMATED DIFF
ATYPICAL LYMPHS: 7.8 % — ABNORMAL HIGH (ref 0–0)
BAND NEUTROPHILS: 1.9 % (ref 0–11)
BASOPHILS: 0 % (ref 0–3)
EOSINOPHILS: 5.8 % — ABNORMAL HIGH (ref 0–5)
HCT: 36.1 % — ABNORMAL LOW (ref 37.0–50.0)
HGB: 12 gm/dl — ABNORMAL LOW (ref 12.4–17.2)
LYMPHOCYTES: 19.4 % — ABNORMAL LOW (ref 28–48)
MCH: 28.2 pg (ref 23.0–34.6)
MCHC: 33.2 gm/dl (ref 30.0–36.0)
MCV: 84.7 fL (ref 80.0–98.0)
MONOCYTES: 14.6 % — ABNORMAL HIGH (ref 1–13)
MPV: 13 fL — ABNORMAL HIGH (ref 6.0–10.0)
MYELOCYTES: 1.9 % — ABNORMAL HIGH (ref 0–0)
NEUTROPHILS: 48.6 % (ref 34–64)
NRBC: 0 (ref 0–0)
PLATELET COMMENTS: DECREASED
PLATELET: 115 10*3/uL — ABNORMAL LOW (ref 140–450)
RBC: 4.26 M/uL (ref 3.80–5.70)
RDW-SD: 41.1 (ref 35.1–43.9)
WBC: 3.1 10*3/uL — ABNORMAL LOW (ref 4.0–11.0)

## 2018-07-31 LAB — METABOLIC PANEL, BASIC
Anion gap: 3 mmol/L — ABNORMAL LOW (ref 5–15)
BUN: 5 mg/dl — ABNORMAL LOW (ref 7–25)
CO2: 26 mEq/L (ref 21–32)
Calcium: 8.3 mg/dl — ABNORMAL LOW (ref 8.5–10.1)
Chloride: 111 mEq/L — ABNORMAL HIGH (ref 98–107)
Creatinine: 0.6 mg/dl (ref 0.6–1.3)
GFR est AA: >60
GFR est non-AA: >60
Glucose: 82 mg/dl (ref 74–106)
Potassium: 3.8 mEq/L (ref 3.5–5.1)
Sodium: 139 mEq/L (ref 136–145)

## 2018-07-31 MED ORDER — BISACODYL 10 MG RECTAL SUPPOSITORY
10 mg | Freq: Every day | RECTAL | Status: DC
Start: 2018-07-31 — End: 2018-07-31

## 2018-07-31 MED ORDER — OMEPRAZOLE 40 MG CAP, DELAYED RELEASE
40 mg | ORAL_CAPSULE | Freq: Every day | ORAL | 0 refills | Status: DC
Start: 2018-07-31 — End: 2018-08-19

## 2018-07-31 MED ORDER — DICYCLOMINE 10 MG CAP
10 mg | ORAL_CAPSULE | Freq: Four times a day (QID) | ORAL | 0 refills | Status: DC
Start: 2018-07-31 — End: 2018-08-06

## 2018-07-31 MED FILL — BD POSIFLUSH NORMAL SALINE 0.9 % INJECTION SYRINGE: INTRAMUSCULAR | Qty: 10

## 2018-07-31 MED FILL — DICYCLOMINE 10 MG CAP: 10 mg | ORAL | Qty: 1

## 2018-07-31 MED FILL — OMEPRAZOLE 20 MG CAP, DELAYED RELEASE: 20 mg | ORAL | Qty: 2

## 2018-07-31 NOTE — Progress Notes (Signed)
D/c papers given and signed by pt. All questions answered. Awaiting for his ride.

## 2018-07-31 NOTE — Other (Signed)
Bedside and Verbal shift change report given to Amee Rn (oncoming nurse) by Everest Rn (offgoing nurse). Report included the following information SBAR, Kardex and MAR.

## 2018-07-31 NOTE — Progress Notes (Signed)
Problem: Falls - Risk of  Goal: *Absence of Falls  Description: Document Schmid Fall Risk and appropriate interventions in the flowsheet.  Outcome: Progressing Towards Goal  Note: Fall Risk Interventions:            Medication Interventions: Patient to call before getting OOB         History of Falls Interventions: Bed/chair exit alarm

## 2018-07-31 NOTE — Other (Addendum)
----------  DocumentID: [id number]------------------------------------------------              [facility]                       Patient Education Report         Name: Travis [name], Travis [name]                  Date: 07/22/2018    MRN: [id number]                    Time: 9:38:47 AM         Patient ordered video: 'Patient Safety: Stay Safe While you are in the Hospital'    from [id number] via phone number: [phone] at 9:38:47 AM    Description: This program outlines some of the precautions patients can take to ensure a speedy recovery without extra complications. The video emphasizes the importance of communicating with the healthcare team.    ----------DocumentID: [id number]------------------------------------------------                       [facility]          Patient Education Report - Discharge Summary        Date: 07/31/2018   Time: 5:26:01 PM   Name: Travis [name], Travis [name]   MRN: [id number]      Account Number: [id number]      Education History:        Patient ordered video: 'Patient Safety: Stay Safe While you are in the Hospital' from [id number] on 07/22/2018 09:38:47 AM

## 2018-07-31 NOTE — Progress Notes (Signed)
A member of [facility] [place]                 GI Progress Note    Patient:  Travis [name]  Date of Birth:  08/03/1990  Age: 28 y.o.  Sex: male  PCP: None  MRN: [id number]                    Admit Date: 07/21/2018    Assessment:   1. Nausea, vomiting abdominal pain.??CT with enteritis vs mild enterocolitis. ??Suspect likely infectious enteritis, now ileus on xray  ?? Cdiff, stool cult neg  2. AKI. Resolved   3. Elevated transaminases. ??CT with unremarkable liver.LFTS trending down.   ?? Viral pnl NEG   ?? U/s RUQ-liver unremarkable, small amt perihepatic ascites  4. History of Bell's palsy    Plan:   ?? Diet as tol??-bland   ?? Cont Bentyl for abd discomfort??  ?? PPI,Antiemetics PRN  ?? OOB  ?? Dulcolax sup   ?? No further GI recs or intervention, will s/o, avail PRN, thank you   Subjective:   tol diet  + flatus, + BM--loose, soft, nonbloody  No abd pain  No n/v  Noticed a few drops of blood from his penis yesterday after passing urine  Objective:   Physical Exam:  Visit Vitals  BP 136/81 (BP 1 Location: Left arm, BP Patient Position: Supine)   Pulse 73   Temp 99 ??F (37.2 ??C)   Resp 24   Ht 5\' 9"  (1.753 m)   Wt 71 kg (156 lb 8.4 oz)   SpO2 99%   BMI 23.11 kg/m??      Temp (24hrs), Avg:99.2 ??F (37.3 ??C), Min:98.2 ??F (36.8 ??C), Max:99.9 ??F (37.7 ??C)      Intake/Output Summary (Last 24 hours) at 07/31/2018 1053  Last data filed at 07/31/2018 [id number]  Gross per 24 hour   Intake 3079.17 ml   Output 100 ml   Net 2979.17 ml     GENERAL: alert, no distress  LUNG: normal respiratory effort  HEART: regular rate and rhythm  ABDOMEN: soft, nontender, nondistended, bowel sounds normoactive    Current Facility-Administered Medications   Medication Dose Route Frequency Provider Last Rate Last Dose   ??? bisacodyL (DULCOLAX) suppository 10 mg  10 mg Rectal DAILY Ayesha MohairBrownell, Lilyan Prete A, NP       ??? methylnaltrexone (RELISTOR) injection 8 mg  8 mg SubCUTAneous DAILY PRN Holladay, Versie StarksJeffrey G, MD        ??? dicyclomine (BENTYL) capsule 10 mg  10 mg Oral QID Holladay, Versie StarksJeffrey G, MD   10 mg at 07/31/18 0859   ??? omeprazole (PRILOSEC) capsule 40 mg  40 mg Oral ACB Holladay, Versie StarksJeffrey G, MD   40 mg at 07/31/18 0859   ??? [Held by provider] dextrose 5% - 0.45% NaCl with KCl 20 mEq/L infusion  125 mL/hr IntraVENous CONTINUOUS Holladay, Versie StarksJeffrey G, MD 125 mL/hr at 07/30/18 1152 125 mL/hr at 07/30/18 1152   ??? loperamide (IMODIUM) capsule 2 mg  2 mg Oral Q4H PRN Corene Corneailes, Nathan L, MD   2 mg at 07/29/18 1906   ??? simethicone (MYLICON) tablet 80 mg  80 mg Oral QID PRN Theodis Aguaserefe, Yared G, MD   80 mg at 07/30/18 1901   ??? ondansetron (ZOFRAN) injection 4 mg  4 mg IntraVENous Q6H PRN Jasarevic, Muhamed, MD   4 mg at 07/26/18 1511   ??? sodium chloride (NS) flush 5-10 mL  5-10  mL IntraVENous Q8H Holladay, Lorra Hals, MD   10 mL at 07/31/18 0600   ??? sodium chloride (NS) flush 5-10 mL  5-10 mL IntraVENous PRN Rosalene Billings, MD   10 mL at 07/23/18 0857   ??? naloxone (NARCAN) injection 0.1 mg  0.1 mg IntraVENous PRN Rosalene Billings, MD       ??? albuterol (PROVENTIL VENTOLIN) nebulizer solution 2.5 mg  2.5 mg Nebulization Q4H PRN Rosalene Billings, MD            DATA    CBC w/Diff   Lab Results   Component Value Date/Time    WBC 3.1 (L) 07/31/2018 06:27 AM    WBC 2.6 (L) 07/30/2018 06:57 AM    WBC 3.6 (L) 07/29/2018 05:23 AM    RBC 4.26 07/31/2018 06:27 AM    RBC 4.21 07/30/2018 06:57 AM    RBC 4.32 07/29/2018 05:23 AM    HGB 12.0 (L) 07/31/2018 06:27 AM    HGB 11.9 (L) 07/30/2018 06:57 AM    HGB 12.3 (L) 07/29/2018 05:23 AM    HCT 36.1 (L) 07/31/2018 06:27 AM    HCT 35.6 (L) 07/30/2018 06:57 AM    HCT 36.8 (L) 07/29/2018 05:23 AM    MCV 84.7 07/31/2018 06:27 AM    MCV 84.6 07/30/2018 06:57 AM    MCV 85.2 07/29/2018 05:23 AM    MCH 28.2 07/31/2018 06:27 AM    MCH 28.3 07/30/2018 06:57 AM    MCH 28.5 07/29/2018 05:23 AM    MCHC 33.2 07/31/2018 06:27 AM    MCHC 33.4 07/30/2018 06:57 AM    MCHC 33.4 07/29/2018 05:23 AM     PLT 115 (L) 07/31/2018 06:27 AM    PLT 113 (L) 07/30/2018 06:57 AM    PLT 118 (L) 07/29/2018 05:23 AM     Lab Results   Component Value Date/Time    BANDS 1.9 07/31/2018 06:27 AM    GRANS 48.6 07/31/2018 06:27 AM    GRANS 55.2 07/30/2018 06:57 AM    GRANS 57.6 07/29/2018 05:23 AM    MONOS 14.6 (H) 07/31/2018 06:27 AM    MONOS 11.4 07/30/2018 06:57 AM    MONOS 12.9 07/29/2018 05:23 AM    EOS 5.8 (H) 07/31/2018 06:27 AM    EOS 0.0 07/30/2018 06:57 AM    EOS 5.2 (H) 07/29/2018 05:23 AM    BASOS 0.0 07/31/2018 06:27 AM    BASOS 1.0 07/30/2018 06:57 AM    BASOS 0.3 07/29/2018 05:23 AM        Hepatic Function   Recent Labs     07/29/18  0523   ALT 172*   TBILI 0.6   CBIL 0.1   AP 55   ALB 2.8*   TP 6.7        Pancreatic Enzymes   No results for input(s): AML, LPSE in the last 72 hours.     Basic Metabolic Profile   Recent Labs     07/31/18  0627 07/31/18  0625 07/30/18  0657 07/29/18  0523   NA 139  --  140 141   K 3.8  --  3.9 3.8   CL 111*  --  111* 112*   CO2 26  --  26 25   BUN 5*  --  4* 4*   GLU 82  --  88 91   CREA 0.6  --  0.7 0.7   CA 8.3*  --  7.9* 8.0*   MG  --  1.5*  --   --  Coagulation   No results for input(s): PTP, INR, APTT, INREXT in the last 72 hours.       Stool FOB   Lab Results   Component Value Date/Time    Occult blood, stool NEGATIVE 07/28/2018 11:40 AM       Radiology Results  Ct Abd Pelv Wo Cont    Result Date: 07/22/2018  Abdomen and Pelvis CT scan. Without intravenous contrast. Comparison:  None Indication: AKI, diarrhea, vomiting.  Initial quality [facility] interpretation. Technique: 5 mm axial images without  Isovue 370 nonionic intravenous contrast. Oral contrast not given. Findings: Visualized lungs: Clear Hepatobiliary: Normal Stomach: Normal Pancreas: Normal Spleen: Normal Adrenal glands: Normal Kidneys/ureters: Normal Peritoneum/retroperitoneum:  Normal Lymph nodes: Normal Bowel: Fluid-filled colonic loops. Mild wall thickening of small bowel. Findings  consistent with enteritis versus mild enterocolitis. No obstruction. Normal appendix.Marland Kitchen.  Pelvic organs/Bladder: Normal Bones and soft tissues: Normal Other: None     Impression: 1. Findings consistent with enteritis versus mild enterocolitis. 2. Normal appendix DICOM format image data is available to non-affiliated external healthcare facilities or entities on a secure, media free, reciprocally searchable basis with patient authorization for 12 months following the date of the study.           Ayesha Mohairynthia Hodaya Curto NP-C  [facility]  Cell # [phone]

## 2018-07-31 NOTE — Discharge Summary (Signed)
Discharge Summary    Patient: Travis [name] Age: 28 y.o. Sex: male    Date of Birth: 10/12/1990 Admit Date: 07/21/2018 PCP: None   MRN: [id number]  CSN: [id number]       Author: Iline OvenBrian J. Myca Perno, MD  Service: [facility]  Pager: [phone]  Date: July 31, 2018    Encounter Summary:     Admission Date: 07/21/2018  Discharge Date: July 31, 2018  Length of Stay: 10 Days  Consultants: GI  Procedures: None  Disposition: Home or Self Care  Condition: Stable  Diet: Low-salt, low fat  Activities: No restrictions  Code Status: Full Code  Discharge Time: 35 minutes.    Discharge Diagnoses:     Intractable nausea and vomtiing with abd pain  Elevated LFT's.  Dairrhea resolved  Constipation.  Acute renal failure due to pre-renal state improved with [name] fluids  Enteritris. Continue symptom management.   Hypokalemia due to vomiting. Improved  Mild thrombocytopenia.     - Stool studies negative for Cdiff and neg cultures  - Viral hepatitis panel negative    ?? Hypertension  ?? Asthma  ?? Hx: Bell's palsey    Discharge Follow-up:     Issues Needing Addressed:    1. 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 Needs PCP    Follow-up Information     Follow up With Specialties Details Why Contact Info    [facility]  Call  [address]  [facility]  [place] [phone]  [phone]    Jonna CoupPayman, Gary H, MD Gastroenterology Schedule an appointment as soon as possible for a visit in 4 weeks  [address] [facility] [address] [address]  [address]  [place] [id number]  [phone]           Discharge Medications:     Current Discharge Medication List      START taking these medications    Details   dicyclomine (BENTYL) 10 mg capsule Take 1 Cap by mouth four (4) times daily.  Qty: 15 Cap, Refills: 0      omeprazole (PRILOSEC) 40 mg capsule Take 1 Cap by mouth Daily (before breakfast).  Qty: 30 Cap, Refills: 0         CONTINUE these medications which have NOT CHANGED    Details    albuterol (PROAIR HFA) 90 mcg/actuation inhaler Take 1 Puff by inhalation every four (4) hours as needed for Wheezing.  Qty: 1 Inhaler, Refills: 0             Discharge Instructions:     1. Please follow-up with [facility] to arrange PCP  2. Please follow-up with Dr. Levonne HubertPaymon (GI) in 4 weeks  3. Prescriptions for anti-spasm and anti-acid agents sent to pharmacy.    Hospital course and discharge diagnoses were discussed with the patient. Patient understands and is in agreement with discharge plan.  I answered any question that the patient and/or family had.     HPI (Per Admitting Provider):     Travis [name]??is a 28 y.o.??year old male??who presents with diffuse??abd pain with n/v for the last 24 hours. ??Pt last ate soup at 2000, but was unable to keep it down.??Patient has had 5-10 episodes of non-bloody diarrhea. No sick contacts, no fever. He is ??complaining of being thirsty. Noted to have Cr of 4.0, baseline normal. CT abdomen and pelvis pending.     Hospital Course:     Patient admitted. Had intractable nausea and vomtiing with abd pain.  Stool studies negative for Cdiff and neg cultures.  Elevated LFT's.viral hepatitis panel negative. LFT's were elevated -- hepatitis workup negative. Improved.  Diarrhea alternated with constipation. KUB showed non-specific ileus.  Was on some narcotics but discontinued.  Of note, AKI, AGMA and hypokalemia resolved with [name] fluids.  Follow-up with [facility] and GI.    Lab Trends:     K: 3.6 ? 3.1 ? 3.1 ? 3.8  CO2: 13 ? 21 ? 24 ? 26  AG: 16 ? 6 ? 4 ? 3  BUN: 38 ? 24 ? 15 ? 5  Cr: 4.0 ? 1.4 ? 1.2 ? 0.6  Mg: 2.4 ? 2.2 ? 2.4    TBili: 1.1 ? 1.2 ? 0.2 ? 0.2 ? 0.6  DBili: 0.1 ? 0.1 ? 0.1  AST: 18 ? 29 ? 142 ? 110 ? 92  A0: 87 ? 54 ? 48 ? 49 ? 55    Labs Results:     07/30/18: Na 140, K 3.9, CO2 26, AG 3, BUN 4, Cr 0.7, Ca 7.9  07/30/18: WBC 2.6, Hb 11.9, Hct 35.6, Plt 113, MCV 84.6  07/29/18: TBili 0.6, DBili 0.1, AST 92, A0 55, TP 6.7, Alb 2.8   07/28/18: OVA & PARASITES, STOOL = See report  07/27/18: U/A: SG <=1.005, pH 6.0, Prot -, Bld -, LE -, NI -  07/27/18: Lipase = 231  ??  Microbiology:     07/29/18: Hepatitis A, IgM (05:23) - NON-REACTIVE  07/29/18: Hepatitis B core, IgM (05:23) - NON-REACTIVE  07/29/18: Hepatitis B surface Ag (05:23) - NON-REACTIVE  07/29/18: Hepatitis C virus Ab (05:23) - NON-REACTIVE  07/28/18: Stool Culture (11:40) - No pathologic organisms  07/22/18: COVID-19 (INPATIENT TESTING) = Negative  07/22/18: C. Difficile PCR (04:07) - Negative    Radiology:     07/30/18: XR ABD (AP AND ERECT OR DECUB) (14:33)  ??  - Ileus  ??  07/28/18: [place] RUQ (23:28)  ??  - Small amounts of perihepatic ascites.  ??  07/22/18: CT ABD PELV WO CONT (00:20)  ??  - Findings consistent with enteritis versus mild enterocolitis.  - Normal appendix    Cardiology:     None    Other Results     None    Discharge Exam:     Visit Vitals  BP 136/81 (BP 1 Location: Left arm, BP Patient Position: Supine)   Pulse 73   Temp 99 ??F (37.2 ??C)   Resp 24   Ht 5\' 9"  (1.753 m)   Wt 71 kg (156 lb 8.4 oz)   SpO2 99%   BMI 23.11 kg/m??      General: Appears stated age.  Head: Normocephalic. Atraumatic  Eyes: Aniecteric scelera.  Mouth: Normal tongue.   Neck: Supple. Trachea midline.   Chest: No abnormal curvature of spine.  Lungs: Breath sounds present bilaterally  Heart: Regular rhythm. No extra sounds or murmurs.  Abdomen: Soft. Bowel sounds present.  Extremities: No amputations. No asymmetry  Skin: No rashes or lesions. No cyanosis.  MSK: No joint swelling / redness.  Neurologic: No new focal defects

## 2018-07-31 NOTE — Progress Notes (Signed)
Discharge Date: 07/31/2018    Discharge Location: Home     Discharge Needs: None    Transportation: Family will transport home    Communication with: Patient, RN, MD

## 2018-07-31 NOTE — Other (Signed)
To exit via wheelchair. Pt. Left in stable condition.

## 2018-07-31 NOTE — Progress Notes (Signed)
Progress Notes by Shelba Flake, NP at 07/31/18 1053                Author: Shelba Flake, NP  Service: Gastroenterology  Author Type: Nurse Practitioner       Filed: 07/31/18 1107  Date of Service: 07/31/18 1053  Status: Signed           Editor: Shelba Flake, NP (Nurse Practitioner)  Cosigner: Larene Beach, MD at 08/01/18 [phone]                       A member of [facility]                   GI Progress Note      Patient:  Travis [name]   Date of Birth:  1990/09/12   Age: 28 y.o.   Sex: male   PCP: None   MRN: [id number]                      Admit Date: 07/21/2018        Assessment:     1.  Nausea, vomiting abdominal pain.??CT with enteritis vs mild enterocolitis. ??Suspect likely infectious enteritis, now ileus on xray   ??  Cdiff, stool cult neg   2.  AKI. Resolved    3.  Elevated transaminases. ??CT with unremarkable liver.LFTS trending down.    ??  Viral pnl NEG    ??  U/s RUQ-liver unremarkable, small amt perihepatic ascites   4.  History of Bell's palsy        Plan:     ??  Diet as tol??-bland    ??  Cont Bentyl for abd discomfort??   ??  PPI,Antiemetics PRN   ??  OOB   ??  Dulcolax sup    ??  No further GI recs or intervention, will s/o, avail PRN, thank you      Subjective:     tol diet   + flatus, + BM--loose, soft, nonbloody   No abd pain   No n/v   Noticed a few drops of blood from his penis yesterday after passing urine     Objective:     Physical Exam:   Visit Vitals      BP  136/81 (BP 1 Location: Left arm, BP Patient Position: Supine)     Pulse  73     Temp  99 ??F (37.2 ??C)     Resp  24     Ht  5\' 9"  (1.753 m)     Wt  71 kg (156 lb 8.4 oz)     SpO2  99%        BMI  23.11 kg/m??         Temp (24hrs), Avg:99.2 ??F (37.3 ??C), Min:98.2 ??F (36.8 ??C), Max:99.9 ??F (37.7 ??C)         Intake/Output Summary (Last 24 hours) at 07/31/2018 1053   Last data filed at 07/31/2018 0644     Gross per 24 hour        Intake  3079.17 ml        Output  100 ml        Net   2979.17 ml        GENERAL: alert, no distress   LUNG: normal respiratory effort   HEART: regular rate and rhythm   ABDOMEN: soft, nontender, nondistended, bowel sounds  normoactive        Current Facility-Administered Medications             Medication  Dose  Route  Frequency  Provider  Last Rate  Last Dose              ?  bisacodyL (DULCOLAX) suppository 10 mg   10 mg  Rectal  DAILY  Ayesha Mohair A, NP           ?  methylnaltrexone (RELISTOR) injection 8 mg   8 mg  SubCUTAneous  DAILY PRN  Holladay, Versie Starks, MD           ?  dicyclomine (BENTYL) capsule 10 mg   10 mg  Oral  QID  Holladay, Versie Starks, MD     10 mg at 07/31/18 0859     ?  omeprazole (PRILOSEC) capsule 40 mg   40 mg  Oral  ACB  Holladay, Versie Starks, MD     40 mg at 07/31/18 0859     ?  [Held by provider] dextrose 5% - 0.45% NaCl with KCl 20 mEq/L infusion   125 mL/hr  IntraVENous  CONTINUOUS  Holladay, Versie Starks, MD  125 mL/hr at 07/30/18 1152  125 mL/hr at 07/30/18 1152     ?  loperamide (IMODIUM) capsule 2 mg   2 mg  Oral  Q4H PRN  Corene Cornea, MD     2 mg at 07/29/18 1906     ?  simethicone (MYLICON) tablet 80 mg   80 mg  Oral  QID PRN  Theodis Aguas, MD     80 mg at 07/30/18 1901     ?  ondansetron (ZOFRAN) injection 4 mg   4 mg  IntraVENous  Q6H PRN  Jasarevic, Muhamed, MD     4 mg at 07/26/18 1511              ?  sodium chloride (NS) flush 5-10 mL   5-10 mL  IntraVENous  Q8H  Holladay, Versie Starks, MD     10 mL at 07/31/18 0600              ?  sodium chloride (NS) flush 5-10 mL   5-10 mL  IntraVENous  PRN  Irving Burton, MD     10 mL at 07/23/18 0857     ?  naloxone (NARCAN) injection 0.1 mg   0.1 mg  IntraVENous  PRN  Irving Burton, MD                    ?  albuterol (PROVENTIL VENTOLIN) nebulizer solution 2.5 mg   2.5 mg  Nebulization  Q4H PRN  Irving Burton, MD                  DATA        CBC w/Diff     Lab Results      Component  Value  Date/Time        WBC  3.1 (L)  07/31/2018 06:27 AM        WBC  2.6 (L)  07/30/2018  06:57 AM        WBC  3.6 (L)  07/29/2018 05:23 AM        RBC  4.26  07/31/2018 06:27 AM        RBC  4.21  07/30/2018 06:57 AM        RBC  4.32  07/29/2018 05:23 AM  HGB  12.0 (L)  07/31/2018 06:27 AM        HGB  11.9 (L)  07/30/2018 06:57 AM        HGB  12.3 (L)  07/29/2018 05:23 AM        HCT  36.1 (L)  07/31/2018 06:27 AM        HCT  35.6 (L)  07/30/2018 06:57 AM        HCT  36.8 (L)  07/29/2018 05:23 AM        MCV  84.7  07/31/2018 06:27 AM        MCV  84.6  07/30/2018 06:57 AM        MCV  85.2  07/29/2018 05:23 AM        MCH  28.2  07/31/2018 06:27 AM        MCH  28.3  07/30/2018 06:57 AM        MCH  28.5  07/29/2018 05:23 AM        MCHC  33.2  07/31/2018 06:27 AM        MCHC  33.4  07/30/2018 06:57 AM        MCHC  33.4  07/29/2018 05:23 AM        PLT  115 (L)  07/31/2018 06:27 AM        PLT  113 (L)  07/30/2018 06:57 AM        PLT  118 (L)  07/29/2018 05:23 AM           Lab Results      Component  Value  Date/Time        BANDS  1.9  07/31/2018 06:27 AM        GRANS  48.6  07/31/2018 06:27 AM        GRANS  55.2  07/30/2018 06:57 AM        GRANS  57.6  07/29/2018 05:23 AM        MONOS  14.6 (H)  07/31/2018 06:27 AM        MONOS  11.4  07/30/2018 06:57 AM        MONOS  12.9  07/29/2018 05:23 AM        EOS  5.8 (H)  07/31/2018 06:27 AM        EOS  0.0  07/30/2018 06:57 AM        EOS  5.2 (H)  07/29/2018 05:23 AM        BASOS  0.0  07/31/2018 06:27 AM        BASOS  1.0  07/30/2018 06:57 AM        BASOS  0.3  07/29/2018 05:23 AM                  Hepatic Function     Recent Labs         07/29/18   0523      ALT  172*      TBILI  0.6      CBIL  0.1      AP  55      ALB  2.8*      TP  6.7                  Pancreatic Enzymes       No results for input(s): AML, LPSE in the last 72 hours.          Basic Metabolic Profile     Recent Labs  07/31/18   1610  07/31/18   0625  07/30/18   0657  07/29/18   0523      NA  139   --   140  141      K  3.8   --   3.9  3.8      CL  111*   --   111*  112*      CO2  26   --    26  25      BUN  5*   --   4*  4*      GLU  82   --   88  91      CREA  0.6   --   0.7  0.7      CA  8.3*   --   7.9*  8.0*      MG   --   1.5*   --    --                      Coagulation       No results for input(s): PTP, INR, APTT, INREXT in the last 72 hours.           Stool FOB      Lab Results         Component  Value  Date/Time            Occult blood, stool  NEGATIVE  07/28/2018 11:40 AM           Radiology Results   Ct Abd Pelv Wo Cont      Result Date: 07/22/2018   Abdomen and Pelvis CT scan. Without intravenous contrast. Comparison:  None Indication: AKI, diarrhea, vomiting.  Initial quality [facility] interpretation. Technique: 5 mm axial images without  Isovue 370 nonionic intravenous contrast. Oral contrast not  given. Findings: Visualized lungs: Clear Hepatobiliary: Normal Stomach: Normal Pancreas: Normal Spleen: Normal Adrenal glands: Normal Kidneys/ureters: Normal Peritoneum/retroperitoneum:  Normal Lymph nodes: Normal Bowel: Fluid-filled colonic loops. Mild  wall thickening of small bowel. Findings consistent with enteritis versus mild enterocolitis. No obstruction. Normal appendix.Marland Kitchen  Pelvic organs/Bladder: Normal Bones and soft tissues: Normal Other: None       Impression: 1. Findings consistent with enteritis versus mild enterocolitis. 2. Normal appendix DICOM format image data is available to non-affiliated external healthcare facilities or entities on a secure, media free, reciprocally searchable basis with  patient authorization for 12 months following the date of the study.                Ayesha Mohair NP-C   [facility]   Cell # [phone]

## 2018-07-31 NOTE — Progress Notes (Signed)
 D/c papers given and signed by pt. All questions answered. Awaiting for his ride.

## 2018-07-31 NOTE — Discharge Summary (Signed)
Discharge Summary    Patient: Travis [name] Age: 28 y.o. Sex: male    Date of Birth: 04/02/1990 Admit Date: 07/21/2018 PCP: None   MRN: [id number]  CSN: [id number]       Author: Iline OvenBrian J. Adryel Wortmann, MD  Service: [facility]  Pager: [phone]  Date: July 31, 2018    Encounter Summary:     Admission Date: 07/21/2018  Discharge Date: July 31, 2018  Length of Stay: 10 Days  Consultants: GI  Procedures: None  Disposition: Home or Self Care  Condition: Stable  Diet: Low-salt, low fat  Activities: No restrictions  Code Status: Full Code  Discharge Time: 35 minutes.    Discharge Diagnoses:     Intractable nausea and vomtiing with abd pain  Elevated LFT's.  Dairrhea resolved  Constipation.  Acute renal failure due to pre-renal state improved with [name] fluids  Enteritris. Continue symptom management.   Hypokalemia due to vomiting. Improved  Mild thrombocytopenia.     - Stool studies negative for Cdiff and neg cultures  - Viral hepatitis panel negative    ?? Hypertension  ?? Asthma  ?? Hx: Bell's palsey    Discharge Follow-up:     Issues Needing Addressed:    1. 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 Needs PCP    Follow-up Information     Follow up With Specialties Details Why Contact Info    [facility]  Call  [address]  [facility]  [place] [id number]  [phone]    Jonna CoupPayman, Gary H, MD Gastroenterology Schedule an appointment as soon as possible for a visit in 4 weeks  [address]  [address]  [place] [id number]  [phone]           Discharge Medications:     Current Discharge Medication List      START taking these medications    Details   dicyclomine (BENTYL) 10 mg capsule Take 1 Cap by mouth four (4) times daily.  Qty: 15 Cap, Refills: 0      omeprazole (PRILOSEC) 40 mg capsule Take 1 Cap by mouth Daily (before breakfast).  Qty: 30 Cap, Refills: 0         CONTINUE these medications which have NOT CHANGED    Details   albuterol (PROAIR HFA) 90 mcg/actuation inhaler Take  1 Puff by inhalation every four (4) hours as needed for Wheezing.  Qty: 1 Inhaler, Refills: 0             Discharge Instructions:     1. Please follow-up with [facility] to arrange PCP  2. Please follow-up with Dr. Levonne HubertPaymon (GI) in 4 weeks  3. Prescriptions for anti-spasm and anti-acid agents sent to pharmacy.    Hospital course and discharge diagnoses were discussed with the patient. Patient understands and is in agreement with discharge plan.  I answered any question that the patient and/or family had.     HPI (Per Admitting Provider):     Travis HamsWalter Freel [name]??is a 28 y.o.??year old male??who presents with diffuse??abd pain with n/v for the last 24 hours. ??Pt last ate soup at 2000, but was unable to keep it down.??Patient has had 5-10 episodes of non-bloody diarrhea. No sick contacts, no fever. He is ??complaining of being thirsty. Noted to have Cr of 4.0, baseline normal. CT abdomen and pelvis pending.     Hospital Course:     Patient admitted. Had intractable nausea and vomtiing with abd pain.  Stool studies negative for Cdiff and neg cultures.  Elevated LFT's.viral hepatitis panel negative. LFT's were elevated -- hepatitis workup negative. Improved.  Diarrhea alternated with constipation. KUB showed non-specific ileus.  Was on some narcotics but discontinued.  Of note, AKI, AGMA and hypokalemia resolved with [name] fluids.  Follow-up with [facility] and GI.    Lab Trends:     K: 3.6 ? 3.1 ? 3.1 ? 3.8  CO2: 13 ? 21 ? 24 ? 26  AG: 16 ? 6 ? 4 ? 3  BUN: 38 ? 24 ? 15 ? 5  Cr: 4.0 ? 1.4 ? 1.2 ? 0.6  Mg: 2.4 ? 2.2 ? 2.4    TBili: 1.1 ? 1.2 ? 0.2 ? 0.2 ? 0.6  DBili: 0.1 ? 0.1 ? 0.1  AST: 18 ? 29 ? 142 ? 110 ? 92  A0: 87 ? 54 ? 48 ? 49 ? 55    Labs Results:     07/30/18: Na 140, K 3.9, CO2 26, AG 3, BUN 4, Cr 0.7, Ca 7.9  07/30/18: WBC 2.6, Hb 11.9, Hct 35.6, Plt 113, MCV 84.6  07/29/18: TBili 0.6, DBili 0.1, AST 92, A0 55, TP 6.7, Alb 2.8  07/28/18: OVA & PARASITES, STOOL = See report  07/27/18: U/A: SG <=1.005, pH 6.0, Prot -,  Bld -, LE -, NI -  07/27/18: Lipase = 231  ??  Microbiology:     07/29/18: Hepatitis A, IgM (05:23) - NON-REACTIVE  07/29/18: Hepatitis B core, IgM (05:23) - NON-REACTIVE  07/29/18: Hepatitis B surface Ag (05:23) - NON-REACTIVE  07/29/18: Hepatitis C virus Ab (05:23) - NON-REACTIVE  07/28/18: Stool Culture (11:40) - No pathologic organisms  07/22/18: COVID-19 (INPATIENT TESTING) = Negative  07/22/18: C. Difficile PCR (04:07) - Negative    Radiology:     07/30/18: XR ABD (AP AND ERECT OR DECUB) (14:33)  ??  - Ileus  ??  07/28/18: [place] RUQ (23:28)  ??  - Small amounts of perihepatic ascites.  ??  07/22/18: CT ABD PELV WO CONT (00:20)  ??  - Findings consistent with enteritis versus mild enterocolitis.  - Normal appendix    Cardiology:     None    Other Results     None    Discharge Exam:     Visit Vitals  BP 136/81 (BP 1 Location: Left arm, BP Patient Position: Supine)   Pulse 73   Temp 99 ??F (37.2 ??C)   Resp 24   Ht 5\' 9"  (1.753 m)   Wt 71 kg (156 lb 8.4 oz)   SpO2 99%   BMI 23.11 kg/m??      General: Appears stated age.  Head: Normocephalic. Atraumatic  Eyes: Aniecteric scelera.  Mouth: Normal tongue.   Neck: Supple. Trachea midline.   Chest: No abnormal curvature of spine.  Lungs: Breath sounds present bilaterally  Heart: Regular rhythm. No extra sounds or murmurs.  Abdomen: Soft. Bowel sounds present.  Extremities: No amputations. No asymmetry  Skin: No rashes or lesions. No cyanosis.  MSK: No joint swelling / redness.  Neurologic: No new focal defects

## 2018-07-31 NOTE — Progress Notes (Signed)
Discharge Date: 07/31/2018    Discharge Location: Home     Discharge Needs: None    Transportation: Family will transport home    Communication with: Patient, RN, MD

## 2018-08-01 LAB — CULTURE, STOOL

## 2018-08-04 LAB — OVA AND PARASITE SCREEN

## 2018-08-04 LAB — OVA & PARASITES, STOOL

## 2018-08-06 ENCOUNTER — Emergency Department: Admit: 2018-08-06 | Payer: MEDICAID | Primary: Family Medicine

## 2018-08-06 ENCOUNTER — Inpatient Hospital Stay
Admit: 2018-08-06 | Discharge: 2018-08-12 | Disposition: A | Discharge status: Home / Self Care | Payer: MEDICAID | Source: Ambulatory Visit | Attending: Internal Medicine | Admitting: Internal Medicine

## 2018-08-06 DIAGNOSIS — B20 Human immunodeficiency virus [HIV] disease: Secondary | ICD-10-CM

## 2018-08-06 DIAGNOSIS — E860 Dehydration: Secondary | ICD-10-CM | POA: Insufficient documentation

## 2018-08-06 LAB — SEDIMENTATION RATE: Sed Rate: 24 mm/Hr — ABNORMAL HIGH (ref 0–15)

## 2018-08-06 LAB — COMPREHENSIVE METABOLIC PANEL
ALT: 82 U/L — ABNORMAL HIGH (ref 12–78)
AST: 45 U/L — ABNORMAL HIGH (ref 15–37)
Albumin: 4.3 gm/dl (ref 3.4–5.0)
Alkaline Phosphatase: 129 U/L — ABNORMAL HIGH (ref 45–117)
Anion Gap: 15 mmol/L (ref 5–15)
BUN: 75 mg/dl — ABNORMAL HIGH (ref 7–25)
CO2: 13 mEq/L — CL (ref 21–32)
Calcium: 9 mg/dl (ref 8.5–10.1)
Chloride: 104 mEq/L (ref 98–107)
Creatinine: 2.1 mg/dl — ABNORMAL HIGH (ref 0.6–1.3)
EGFR IF NonAfrican American: 40
GFR African American: 48
Glucose: 121 mg/dl — ABNORMAL HIGH (ref 74–106)
Potassium: 3.1 mEq/L — ABNORMAL LOW (ref 3.5–5.1)
Sodium: 132 mEq/L — ABNORMAL LOW (ref 136–145)
Total Bilirubin: 1 mg/dl (ref 0.2–1.0)
Total Protein: 10.2 gm/dl — ABNORMAL HIGH (ref 6.4–8.2)

## 2018-08-06 LAB — CBC WITH AUTO DIFFERENTIAL
Basophils %: 0.6 % (ref 0–3)
Eosinophils %: 0.4 % (ref 0–5)
Hematocrit: 51 % — ABNORMAL HIGH (ref 37.0–50.0)
Hemoglobin: 18 gm/dl — ABNORMAL HIGH (ref 12.4–17.2)
Immature Granulocytes: 0.2 % (ref 0.0–3.0)
Lymphocytes %: 16.8 % — ABNORMAL LOW (ref 28–48)
MCH: 28.2 pg (ref 23.0–34.6)
MCHC: 35.3 gm/dl (ref 30.0–36.0)
MCV: 79.8 fL — ABNORMAL LOW (ref 80.0–98.0)
MPV: 13.6 fL — ABNORMAL HIGH (ref 6.0–10.0)
Monocytes %: 18.1 % — ABNORMAL HIGH (ref 1–13)
Neutrophils %: 63.9 % (ref 34–64)
Nucleated RBCs: 0 (ref 0–0)
Platelets: 152 10*3/uL (ref 140–450)
RBC: 6.39 M/uL — ABNORMAL HIGH (ref 3.80–5.70)
RDW-SD: 37.4 (ref 35.1–43.9)
WBC: 4.8 10*3/uL (ref 4.0–11.0)

## 2018-08-06 LAB — POC BLOOD GAS + LACTIC ACID
BASE EXCESS: -16 mmol/L — ABNORMAL LOW (ref <=3)
BICARBONATE: 10.6 mmol/L — CL (ref 18.0–26.0)
Base Excess: -16 mmol/L — ABNORMAL LOW (ref <=3)
CO2 Total: 11 mmol/L — ABNORMAL LOW (ref 24–29)
CO2, TOTAL: 11 mmol/L — ABNORMAL LOW (ref 24–29)
HCO3: 10.6 mmol/L — CL (ref 18.0–26.0)
LACTIC ACID: 0.75 mmol/L (ref 0.40–2.00)
Lactic Acid: 0.75 mmol/L (ref 0.40–2.00)
O2 SAT: 98 % (ref 90–100)
O2 Sat: 98 % (ref 90–100)
PCO2: 20.7 mm Hg — CL (ref 35.0–45.0)
PCO2: 20.7 mm Hg — CL (ref 35.0–45.0)
PO2: 102 mm Hg — ABNORMAL HIGH (ref 75–100)
PO2: 102 mm Hg — ABNORMAL HIGH (ref 75–100)
Patient temp.: 97.4
Patient temperature: 97.4
pH: 7.315 — ABNORMAL LOW (ref 7.350–7.450)
pH: 7.315 — ABNORMAL LOW (ref 7.350–7.450)

## 2018-08-06 LAB — C-REACTIVE PROTEIN: CRP: <2.9 mg/L (ref 0.0–2.9)

## 2018-08-06 LAB — LIPASE
Lipase: 178 U/L (ref 73–393)
Lipase: 178 U/L (ref 73–393)

## 2018-08-06 LAB — SALICYLATE
Salicyclic Acid: <1.7 mg/dl — ABNORMAL LOW (ref 2.8–20.0)
Salicylate: <1.7 mg/dl — ABNORMAL LOW (ref 2.8–20.0)

## 2018-08-06 LAB — POCT GLUCOSE: POC Glucose: 101 mg/dL (ref 65–105)

## 2018-08-06 LAB — LACTIC ACID
LACTIC ACID: 1.3 mmol/L (ref 0.4–2.0)
Lactic Acid: 1.3 mmol/L (ref 0.4–2.0)

## 2018-08-06 LAB — CBC WITH AUTOMATED DIFF
BASOPHILS: 0.6 % (ref 0–3)
EOSINOPHILS: 0.4 % (ref 0–5)
HCT: 51 % — ABNORMAL HIGH (ref 37.0–50.0)
HGB: 18 gm/dl — ABNORMAL HIGH (ref 12.4–17.2)
IMMATURE GRANULOCYTES: 0.2 % (ref 0.0–3.0)
LYMPHOCYTES: 16.8 % — ABNORMAL LOW (ref 28–48)
MCH: 28.2 pg (ref 23.0–34.6)
MCHC: 35.3 gm/dl (ref 30.0–36.0)
MCV: 79.8 fL — ABNORMAL LOW (ref 80.0–98.0)
MONOCYTES: 18.1 % — ABNORMAL HIGH (ref 1–13)
MPV: 13.6 fL — ABNORMAL HIGH (ref 6.0–10.0)
NEUTROPHILS: 63.9 % (ref 34–64)
NRBC: 0 (ref 0–0)
PLATELET: 152 10*3/uL (ref 140–450)
RBC: 6.39 M/uL — ABNORMAL HIGH (ref 3.80–5.70)
RDW-SD: 37.4 (ref 35.1–43.9)
WBC: 4.8 10*3/uL (ref 4.0–11.0)

## 2018-08-06 LAB — METABOLIC PANEL, COMPREHENSIVE
ALT (SGPT): 82 U/L — ABNORMAL HIGH (ref 12–78)
AST (SGOT): 45 U/L — ABNORMAL HIGH (ref 15–37)
Albumin: 4.3 gm/dl (ref 3.4–5.0)
Alk. phosphatase: 129 U/L — ABNORMAL HIGH (ref 45–117)
Anion gap: 15 mmol/L (ref 5–15)
BUN: 75 mg/dl — ABNORMAL HIGH (ref 7–25)
Bilirubin, total: 1 mg/dl (ref 0.2–1.0)
CO2: 13 mEq/L — CL (ref 21–32)
Calcium: 9 mg/dl (ref 8.5–10.1)
Chloride: 104 mEq/L (ref 98–107)
Creatinine: 2.1 mg/dl — ABNORMAL HIGH (ref 0.6–1.3)
GFR est AA: 48
GFR est non-AA: 40
Glucose: 121 mg/dl — ABNORMAL HIGH (ref 74–106)
Potassium: 3.1 mEq/L — ABNORMAL LOW (ref 3.5–5.1)
Protein, total: 10.2 gm/dl — ABNORMAL HIGH (ref 6.4–8.2)
Sodium: 132 mEq/L — ABNORMAL LOW (ref 136–145)

## 2018-08-06 LAB — GLUCOSE, POC: Glucose (POC): 101 mg/dL (ref 65–105)

## 2018-08-06 LAB — C REACTIVE PROTEIN, QT: C-Reactive protein: <2.9 mg/L (ref 0.0–2.9)

## 2018-08-06 LAB — SED RATE (ESR): Sed rate (ESR): 24 mm/Hr — ABNORMAL HIGH (ref 0–15)

## 2018-08-06 MED ORDER — PANTOPRAZOLE 40 MG IV SOLR
40 mg | Freq: Once | INTRAVENOUS | Status: AC
Start: 2018-08-06 — End: 2018-08-06
  Administered 2018-08-06: 19:00:00 via INTRAVENOUS

## 2018-08-06 MED ORDER — SODIUM CHLORIDE 0.9% BOLUS IV
0.9 % | INTRAVENOUS | Status: AC
Start: 2018-08-06 — End: 2018-08-06
  Administered 2018-08-06: 17:00:00 via INTRAVENOUS

## 2018-08-06 MED ORDER — SODIUM CHLORIDE 0.9% BOLUS IV
0.9 % | INTRAVENOUS | Status: AC
Start: 2018-08-06 — End: 2018-08-06
  Administered 2018-08-06: 15:00:00 via INTRAVENOUS

## 2018-08-06 MED ORDER — ACETAMINOPHEN 650 MG RECTAL SUPPOSITORY
650 mg | RECTAL | Status: DC | PRN
Start: 2018-08-06 — End: 2018-08-12

## 2018-08-06 MED ORDER — ACETAMINOPHEN (TYLENOL) SOLUTION 32MG/ML
ORAL | Status: DC | PRN
Start: 2018-08-06 — End: 2018-08-12

## 2018-08-06 MED ORDER — DICYCLOMINE 10 MG CAP
10 mg | Freq: Four times a day (QID) | ORAL | Status: DC
Start: 2018-08-06 — End: 2018-08-12
  Administered 2018-08-06 – 2018-08-12 (×24): via ORAL

## 2018-08-06 MED ORDER — ONDANSETRON (PF) 4 MG/2 ML INJECTION
4 mg/2 mL | INTRAMUSCULAR | Status: DC | PRN
Start: 2018-08-06 — End: 2018-08-12
  Administered 2018-08-08 – 2018-08-11 (×2): via INTRAVENOUS

## 2018-08-06 MED ORDER — D5-1/2 NS & POTASSIUM CHLORIDE 20 MEQ/L IV
20 mEq/L | INTRAVENOUS | Status: DC
Start: 2018-08-06 — End: 2018-08-11
  Administered 2018-08-06 – 2018-08-10 (×6): via INTRAVENOUS

## 2018-08-06 MED ORDER — ALBUTEROL SULFATE 0.083 % (0.83 MG/ML) SOLN FOR INHALATION
2.5 mg /3 mL (0.083 %) | RESPIRATORY_TRACT | Status: DC | PRN
Start: 2018-08-06 — End: 2018-08-12

## 2018-08-06 MED ORDER — ACETAMINOPHEN 325 MG TABLET
325 mg | ORAL | Status: DC | PRN
Start: 2018-08-06 — End: 2018-08-12
  Administered 2018-08-08 (×2): via ORAL

## 2018-08-06 MED ORDER — ONDANSETRON (PF) 4 MG/2 ML INJECTION
4 mg/2 mL | Freq: Four times a day (QID) | INTRAMUSCULAR | Status: DC | PRN
Start: 2018-08-06 — End: 2018-08-06
  Administered 2018-08-06: 21:00:00 via INTRAVENOUS

## 2018-08-06 MED ORDER — MORPHINE 2 MG/ML INJECTION
2 mg/mL | INTRAMUSCULAR | Status: DC | PRN
Start: 2018-08-06 — End: 2018-08-12
  Administered 2018-08-06 – 2018-08-07 (×3): via INTRAVENOUS

## 2018-08-06 MED ORDER — METHYLPREDNISOLONE (PF) 40 MG/ML IJ SOLR
40 mg/mL | Freq: Two times a day (BID) | INTRAMUSCULAR | Status: DC
Start: 2018-08-06 — End: 2018-08-07
  Administered 2018-08-06 – 2018-08-07 (×2): via INTRAVENOUS

## 2018-08-06 MED ORDER — SODIUM CHLORIDE 0.9 % IJ SYRG
Freq: Once | INTRAMUSCULAR | Status: AC
Start: 2018-08-06 — End: 2018-08-06
  Administered 2018-08-06: 15:00:00 via INTRAVENOUS

## 2018-08-06 MED ORDER — D5-1/2 NS & POTASSIUM CHLORIDE 20 MEQ/L IV
20 mEq/L | INTRAVENOUS | Status: DC
Start: 2018-08-06 — End: 2018-08-06
  Administered 2018-08-06: 19:00:00 via INTRAVENOUS

## 2018-08-06 MED ORDER — NALOXONE 0.4 MG/ML INJECTION
0.4 mg/mL | INTRAMUSCULAR | Status: DC | PRN
Start: 2018-08-06 — End: 2018-08-12

## 2018-08-06 MED ORDER — ONDANSETRON (PF) 4 MG/2 ML INJECTION
4 mg/2 mL | Freq: Once | INTRAMUSCULAR | Status: AC
Start: 2018-08-06 — End: 2018-08-06
  Administered 2018-08-06: 15:00:00 via INTRAVENOUS

## 2018-08-06 MED FILL — ONDANSETRON (PF) 4 MG/2 ML INJECTION: 4 mg/2 mL | INTRAMUSCULAR | Qty: 2

## 2018-08-06 MED FILL — SOLU-MEDROL (PF) 40 MG/ML SOLUTION FOR INJECTION: 40 mg/mL | INTRAMUSCULAR | Qty: 1

## 2018-08-06 MED FILL — MORPHINE 2 MG/ML INJECTION: 2 mg/mL | INTRAMUSCULAR | Qty: 1

## 2018-08-06 MED FILL — PANTOPRAZOLE 40 MG IV SOLR: 40 mg | INTRAVENOUS | Qty: 40

## 2018-08-06 MED FILL — DICYCLOMINE 10 MG CAP: 10 mg | ORAL | Qty: 1

## 2018-08-06 NOTE — ED Notes (Signed)
Patient reports of having abdominal pain that's has been there since he was discharged on Friday 07/31/2018    Reports of vomiting twice today, 5 episodes of diarrhea today.    Reports of getting relief from the abdominal pain from vomiting

## 2018-08-06 NOTE — Progress Notes (Signed)
Travis [name] admitted on 08/06/2018 from ED from home with   Chief Complaint   Travis [name] presents with   ??? Abdominal Pain   ??? Vomiting   ??? Diarrhea          The Travis [name] is being treated for    PMH:   Past Medical History:   Diagnosis Date   ??? Asthma    ??? Hypertension    ??? Neurological disorder     Bell's palsy        Treatment Team: Treatment Team: Attending Provider: Garry HeaterHutchinson, Anne, DO; Primary Nurse: Cleophus MoltKariri, Eliud; Physician Assistant: Velia MeyerPeoples, Jessica, PA; Tech: Juanetta GoslingHaverty, Matthew; Consulting Provider: Richarda OverlieHutchinson, Anne, DO; Consulting Provider: Franky Machoicketts, Paul A, MD      The Travis [name] has been admitted to the hospital 1 times in the past 12 months.    Previous 4 Admission Dates Admission and Discharge Diagnosis Interventions Barriers Disposition                                 Travis [name] and Family/Caregivers Goals of Care: tx abd pain n/v    Caregivers Participating in Plan of Care/Discharge Plan with the Travis [name]: mom    Tentative dc plan: Home.     Anticipated DME needs for discharge: na    PRESCREENING COMPLETED FOR SNFna      Does the Travis [name] have appropriate clothing available to be worn at discharge? yes      The Travis [name] and care participants are willing to travel immediate area for discharge facility.  The Travis [name] and plan of care participants have been provided with a list of all available Rehab Facilities or Home Health agencies as applicable. CM will follow up with a list of facilities or agencies that are offer acceptance.    CM has disclosed any financial interest that [facility] may have with any facility or agency.    Anticipated Discharge Date: 6/14    The plan of care and discharge plan has been discussed with Richarda OverlieHutchinson, Anne, DO and all other appropriate providers and adjusted per interdisciplinary team recommendations and in discussion with the Travis [name] and the Travis [name] designated Care Plan Participants.    Barriers to Healthcare Success/ Readmission Risk Factors: Enterocolitis, aki    Consults:   Palliative Care Consult Recommended: n  Transitional Care Clinic Referral: n  Transitional Nurse Navigator Referral: n  Oncology Navigator Referral: n  SW consulted: n  [facility] Consulted: n  Outside Hospital/Community Resources Referrals and Collaboration: n    Food/Nutrition Needs:   n                   Dietician Consulted: n    RRAT Score: Low Risk            4       Total Score        4 Pt. Coverage (Medicare=5 , Medicaid, or Self-Pay=4)        Criteria that do not apply:    Has Seen PCP in Last 6 Months (Yes=3, No=0)    Married. Living with Significant Other. Assisted Living. LTAC. SNF. or   Rehab    Travis [name] Length of Stay (>5 days = 3)    IP Visits Last 12 Months (1-3=4, 4=9, >4=11)    Charlson Comorbidity Score (Age + Comorbid Conditions)           PCP: Paulla ForeKapoor, [name], MD . How do you get to your  doctor appointments?self    Specialists: none he states    Dialysis Unit: na    Pharmacy: [facility]. Are there any medications that you have trouble paying for? Any difficulty getting your medications?N    DME available at [place]:  na            Home O2 Provider: na    Home Environment and Prior Level of Function: Lives at [place] [address] @HOMEPHONE @. Lives with mom he states.  Multistory 3 Steps into home.  Responsibilities at home include adls    Prior to admission open services: Reader-    Extended Emergency Contact Information  Primary Emergency Contact: [place] Phone: [phone]  Work Phone: [phone]  Mobile Phone: [phone]  Relation: Mother     Transportation: family will transport home    Therapy Recommendations:    OT = n    PT = n    SLP =  n     RT Home O2 Evaluation =  n    Wound Care =  n    Case Management Assessment    ABUSE/NEGLECT SCREENING   Physical Abuse/Neglect: Denies   Sexual Abuse: Denies   Sexual Abuse: Denies    Other Abuse/Issues: Denies          PRIMARY DECISION MAKER                                   CARE MANAGEMENT INTERVENTIONS   Readmission Interview Completed: Not Applicable   PCP Verified by CM: Yes([name])           Mode of Transport at Discharge: Other (see comment)(mother)       Transition of Care Consult (CM Consult): Discharge Planning           MyChart Signup: No   Discharge Durable Medical Equipment: No   Physical Therapy Consult: No   Occupational Therapy Consult: No   Speech Therapy Consult: No   Current Support Network: Relative's Home, Family Lives Nearby   Reason for Referral: DCP Rounds   History Provided By: Travis [name], Medical Record   Travis [name] Orientation: Alert and Oriented, Person, Place, Situation, Self   Cognition: Alert   Support System Response: Concerned, Cooperative   Previous Living Arrangement: Lives with Family Independent   Home Accessibility: Steps, Multi Level Home   Prior Functional Level: Independent in ADLs/IADLs   Current Functional Level: Independent in ADLs/IADLs       Can Travis [name] return to prior living arrangement: Yes   Ability to make needs known:: Good   Family able to assist with home care needs:: Yes               Types of Needs Identified: Treatment Education, Disease Management Education       Confirm Follow Up Transport: Family                  DISCHARGE LOCATION   Discharge Placement: Home

## 2018-08-06 NOTE — ED Provider Notes (Signed)
[facility]  Emergency Department Treatment Report    Patient: Travis [name] Age: 28 y.o. Sex: male    Date of Birth: May 09, 1990 Admit Date: 08/06/2018 PCP: Janell Quiet, MD   MRN: [id number]  CSN: [id number]  MD: Oneita Hurt, MD   Room: 5216/5216 Time Dictated: 1:10 PM APP: [facility]     I hereby certify this patient for admission based upon medical necessity as ??  noted below:      Chief Complaint   Abdominal pain, vomiting, diarrhea  History of Present Illness   28 y.o. male with history of hypertension, asthma and Bell's palsy presenting with periumbilical abdominal pain, multiple episodes of vomiting and diarrhea.  Says he is unable to keep any food or drink down.  Patient was admitted for 1-1/2 weeks for similar symptoms.  He had a KUB that showed an ileus and CT scan that showed enteritis.  Patient was discharged on omeprazole and Bentyl and says his pain has gotten worse.  His pain got worse the day he was discharged on June 5 and says he never felt better before being discharged.  He has no mucus or blood in the stools.  He was negative for C. difficile and stool cultures were negative as well.  Says he also had hepatitis testing that was negative.  Denies chest pain, shortness of breath, cough or fever    Mother reports Sunday she tried to feed him a hot dog and he vomited this.  I asked her if she started a clear liquid diet and she says since then they have been trying clear liquids but patient continues to vomit and have diarrhea.  He is currently not on antibiotics. He has lost 13 lbs in 2 weeks    Review of Systems   Review of Systems   Constitutional: Negative for chills, diaphoresis and fever.   HENT: Negative for congestion and sore throat.    Respiratory: Negative for cough and shortness of breath.    Cardiovascular: Negative for chest pain.   Gastrointestinal: Positive for abdominal pain, diarrhea, nausea and vomiting. Negative for blood in stool.    Genitourinary: Negative for dysuria.   Musculoskeletal: Negative for back pain.   Skin: Negative for rash.   Neurological: Negative for sensory change, focal weakness, loss of consciousness and headaches.   Psychiatric/Behavioral: Negative for substance abuse.      All other systems reviewed and are negative.  Past Medical/Surgical History     Past Medical History:   Diagnosis Date   ??? Asthma    ??? Hypertension    ??? Neurological disorder     Bell's palsy     History reviewed. No pertinent surgical history.    Social History     Social History     Socioeconomic History   ??? Marital status: SINGLE     Spouse name: Not on file   ??? Number of children: Not on file   ??? Years of education: Not on file   ??? Highest education level: Not on file   Tobacco Use   ??? Smoking status: Never Smoker   ??? Smokeless tobacco: Never Used   Substance and Sexual Activity   ??? Alcohol use: No   ??? Drug use: No   ??? Sexual activity: Yes     Partners: Female     Birth control/protection: Condom       Family History   No family history of ulcerative colitis, diverticulitis or irritable bowel syndrome  Current Medications     Current Facility-Administered Medications   Medication Dose Route Frequency Provider Last Rate Last Dose   ??? albuterol (PROVENTIL VENTOLIN) nebulizer solution 2.5 mg  2.5 mg Nebulization Q4H PRN Hutchinson, Anne, DO       ??? dicyclomine (BENTYL) capsule 10 mg  10 mg Oral QID Hutchinson, Anne, DO   10 mg at 08/06/18 1713   ??? acetaminophen (TYLENOL) tablet 650 mg  650 mg Oral Q4H PRN Jonnie Kind, DO        Or   ??? acetaminophen (TYLENOL) solution 650 mg  650 mg Oral Q4H PRN Hutchinson, Anne, DO        Or   ??? acetaminophen (TYLENOL) suppository 650 mg  650 mg Rectal Q4H PRN Hutchinson, Anne, DO       ??? naloxone (NARCAN) injection 0.1 mg  0.1 mg IntraVENous PRN Hutchinson, Anne, DO       ??? dextrose 5% - 0.45% NaCl with KCl 20 mEq/L infusion  75 mL/hr IntraVENous CONTINUOUS Hutchinson, Anne, DO 75 mL/hr at 08/06/18 1709 75  mL/hr at 08/06/18 1709   ??? morphine injection 2 mg  2 mg IntraVENous Q4H PRN Jonnie Kind, DO   2 mg at 08/06/18 1719   ??? ondansetron (ZOFRAN) injection 4 mg  4 mg IntraVENous Q4H PRN Hutchinson, Anne, DO       ??? methylPREDNISolone (PF) (SOLU-MEDROL) injection 30 mg  30 mg IntraVENous Q12H Cheral Almas A, NP   30 mg at 08/06/18 1656       Allergies   No Known Allergies    Physical Exam     Visit Vitals  BP (!) 139/102 (BP 1 Location: Left arm)   Pulse 97   Temp 97.5 ??F (36.4 ??C)   Resp 21   Ht 5' 7"  (1.702 m)   Wt 70.9 kg (156 lb 4.9 oz)   SpO2 100%   BMI 24.48 kg/m??     Physical Exam  Vitals signs reviewed.   Constitutional:       Appearance: He is ill-appearing. He is not diaphoretic.   HENT:      Head: Normocephalic and atraumatic.      Mouth/Throat:      Comments: Very dry and cracked lips, dry oropharynx   Eyes:      Conjunctiva/sclera: Conjunctivae normal.      Pupils: Pupils are equal, round, and reactive to light.   Neck:      Musculoskeletal: Normal range of motion and neck supple.   Cardiovascular:      Rate and Rhythm: Regular rhythm. Tachycardia present.      Heart sounds: Normal heart sounds.   Pulmonary:      Effort: Pulmonary effort is normal.      Breath sounds: Normal breath sounds. No wheezing.   Abdominal:      General: Bowel sounds are normal.      Palpations: Abdomen is soft.      Tenderness: There is generalized abdominal tenderness. There is no guarding.   Musculoskeletal: Normal range of motion.         General: No deformity.   Lymphadenopathy:      Cervical: No cervical adenopathy.   Skin:     General: Skin is warm and dry.      Findings: No rash.   Neurological:      Mental Status: He is alert and oriented to person, place, and time.   Psychiatric:         Mood and  Affect: Mood normal.         Cognition and Memory: Memory normal.         Impression and Management Plan   28 year old male presented to ED for generalized abdominal pain.  Recently  admitted and discharged for similar symptoms with vomiting and diarrhea and continues to have multiple episodes of both.  He is afebrile.  He does have some generalized tenderness on exam.  Will repeat labs.  He appears very dry, will give 2 L [name] fluids and Zofran    Diagnostic Studies   Lab:   Recent Results (from the past 12 hour(s))   CBC WITH AUTOMATED DIFF    Collection Time: 08/06/18 11:14 AM   Result Value Ref Range    WBC 4.8 4.0 - 11.0 1000/mm3    RBC 6.39 (H) 3.80 - 5.70 M/uL    HGB 18.0 (H) 12.4 - 17.2 gm/dl    HCT 51.0 (H) 37.0 - 50.0 %    MCV 79.8 (L) 80.0 - 98.0 fL    MCH 28.2 23.0 - 34.6 pg    MCHC 35.3 30.0 - 36.0 gm/dl    PLATELET 152 140 - 450 1000/mm3    MPV 13.6 (H) 6.0 - 10.0 fL    RDW-SD 37.4 35.1 - 43.9      NRBC 0 0 - 0      IMMATURE GRANULOCYTES 0.2 0.0 - 3.0 %    NEUTROPHILS 63.9 34 - 64 %    LYMPHOCYTES 16.8 (L) 28 - 48 %    MONOCYTES 18.1 (H) 1 - 13 %    EOSINOPHILS 0.4 0 - 5 %    BASOPHILS 0.6 0 - 3 %   METABOLIC PANEL, COMPREHENSIVE    Collection Time: 08/06/18 11:14 AM   Result Value Ref Range    Sodium 132 (L) 136 - 145 mEq/L    Potassium 3.1 (L) 3.5 - 5.1 mEq/L    Chloride 104 98 - 107 mEq/L    CO2 13 (LL) 21 - 32 mEq/L    Glucose 121 (H) 74 - 106 mg/dl    BUN 75 (H) 7 - 25 mg/dl    Creatinine 2.1 (H) 0.6 - 1.3 mg/dl    GFR est AA 48.0      GFR est non-AA 40      Calcium 9.0 8.5 - 10.1 mg/dl    AST (SGOT) 45 (H) 15 - 37 U/L    ALT (SGPT) 82 (H) 12 - 78 U/L    Alk. phosphatase 129 (H) 45 - 117 U/L    Bilirubin, total 1.0 0.2 - 1.0 mg/dl    Protein, total 10.2 (H) 6.4 - 8.2 gm/dl    Albumin 4.3 3.4 - 5.0 gm/dl    Anion gap 15 5 - 15 mmol/L   LIPASE    Collection Time: 08/06/18 11:14 AM   Result Value Ref Range    Lipase 178 73 - 102 U/L   SALICYLATE    Collection Time: 08/06/18 11:14 AM   Result Value Ref Range    Salicylate <7.2 (L) 2.8 - 20.0 mg/dl   C REACTIVE PROTEIN, QT    Collection Time: 08/06/18 11:14 AM   Result Value Ref Range    C-Reactive protein <2.9 0.0 - 2.9 mg/L    POC BLOOD GAS + LACTIC ACID    Collection Time: 08/06/18 12:29 PM   Result Value Ref Range    pH 7.315 (L) 7.350 - 7.450      PCO2 20.7 (  LL) 35.0 - 45.0 mm Hg    PO2 102.0 (H) 75 - 100 mm Hg    BICARBONATE 10.6 (LL) 18.0 - 26.0 mmol/L    O2 SAT 98.0 90 - 100 %    CO2, TOTAL 11.0 (L) 24 - 29 mmol/L    Lactic Acid 0.75 0.40 - 2.00 mmol/L    BASE EXCESS -16 (L) -2 - 3 mmol/L    Patient temp. 97.4 F      Sample type Art      SITE L Radial      DEVICE Room Air      ALLENS TEST Pass     GLUCOSE, POC    Collection Time: 08/06/18  5:11 PM   Result Value Ref Range    Glucose (POC) 101 65 - 105 mg/dL       Imaging:    Ct Abd Pelv Wo Cont    Result Date: 08/06/2018  CT Abdomen and Pelvis without Indication: Abdominal pain. Nausea, vomiting, diarrhea. Comparison: 07/22/2018. TECHNIQUE: CT of the abdomen and pelvis WITHOUT intravenous contrast. Coronal and sagittal reformations were obtained. Oral contrast was given. All CT exams at this facility use one or more dose reduction techniques including automatic exposure control, mA/kV adjustment per patient's size, or iterative reconstruction technique. DICOM format imaged data is available to non-affiliated external healthcare facilities or entities on a secure, media free, reciprocal searchable basis with patient authorization for 12 months following the date of the study. DISCUSSION: ABSENCE OF INTRAVENOUS CONTRAST DECREASES SENSITIVITY FOR DETECTION OF FOCAL LESIONS AND VASCULAR PATHOLOGY. LOWER THORAX: Normal. HEPATOBILIARY: No focal hepatic lesions.  No biliary ductal dilatation. SPLEEN: No splenomegaly. PANCREAS: No focal masses or ductal dilatation. ADRENALS: No adrenal nodules. KIDNEYS/URETERS: No hydronephrosis, stones, or solid mass lesions. PELVIC ORGANS/BLADDER: Unremarkable. PERITONEUM / RETROPERITONEUM: No free air or fluid. LYMPH NODES: No lymphadenopathy. VESSELS: Unremarkable. GI TRACT: Multiple nondistended fluid-filled loops of small bowel with questionable  mild wall thickening. Fluid-filled colon with no significant wall thickening. No obstruction. Normal appendix. BONES AND SOFT TISSUES: No acute abnormality.     IMPRESSION: Numerous fluid-filled loops of small and large bowel with borderline diffuse small bowel wall thickening, concerning for infectious or inflammatory enterocolitis. Findings have slightly progressed since 07/22/2018.       Medical Decision Making/ED Course   Patient is severely dehydrated.  AKI of 2.1 creatinine.  Patient had AKI on last admission, creatinine was 4.0 and then normalized at discharge.  Patient appears severely dehydrated clinically.  Likely causing metabolic acidosis.  Discussed with patient needing admission again.  He has been given 2 L [name] fluid in ED which improved his tachycardia.  CT of abdomen shows slightly progressed enterocolitis.  Will consult [facility] and GI Dr. Georgianne Fick Payman's group.  Likely needs to continue clear liquid diet.  His white count and lactic acid is normal.  Patient is stable for admission. No episode of diarrhea or vomiting in ED.    Patient is resting comfortably.  Spoke to Dr. Tonye Royalty who accepts for admission.  Spoke to nurse practitioner Caren Griffins who states Dr. Dante Gang is looking at the CT scans.  They will evaluate the patient before starting steroids.  Patient stable for admission.  Medications   albuterol (PROVENTIL VENTOLIN) nebulizer solution 2.5 mg (has no administration in time range)   dicyclomine (BENTYL) capsule 10 mg (10 mg Oral Given 08/06/18 1713)   acetaminophen (TYLENOL) tablet 650 mg (has no administration in time range)     Or  acetaminophen (TYLENOL) solution 650 mg (has no administration in time range)     Or   acetaminophen (TYLENOL) suppository 650 mg (has no administration in time range)   naloxone [facility]) injection 0.1 mg (has no administration in time range)   dextrose 5% - 0.45% NaCl with KCl 20 mEq/L infusion (75 mL/hr IntraVENous Rate Verify 08/06/18 1709)    morphine injection 2 mg (2 mg IntraVENous Given 08/06/18 1719)   ondansetron (ZOFRAN) injection 4 mg (has no administration in time range)   methylPREDNISolone (PF) (SOLU-MEDROL) injection 30 mg (30 mg IntraVENous Given 08/06/18 1656)   sodium chloride (NS) flush 5-10 mL (10 mL IntraVENous Given 08/06/18 1124)   sodium chloride 0.9 % bolus infusion 1,000 mL (0 mL IntraVENous [name] Completed 08/06/18 1242)   ondansetron (ZOFRAN) injection 4 mg (4 mg IntraVENous Given 08/06/18 1123)   sodium chloride 0.9 % bolus infusion 1,000 mL (0 mL IntraVENous [name] Completed 08/06/18 1421)   pantoprazole (PROTONIX) 40 mg in 0.9% sodium chloride 10 mL injection (40 mg IntraVENous Given 08/06/18 1504)     Final Diagnosis       ICD-10-CM ICD-9-CM   1. AKI (acute kidney injury) ([place]) N17.9 584.9   2. Enterocolitis K52.9 558.9   3. Non-intractable vomiting with nausea, unspecified vomiting type R11.2 787.01   4. Diarrhea, unspecified type R19.7 787.91   5. Dehydration E86.0 276.51     Disposition     INPT MEDICAL          The patient was personally evaluated by myself and  [facility], Orlando Penner, MD who agrees with the above assessment and plan.    Clerance Lav, PA-C  August 06, 2018      *Portions of this electronic record were dictated using Systems analyst.  Unintended errors in translation may occur.    My signature above authenticates this document and my orders, the final ??  diagnosis (es), discharge prescription (s), and instructions in the Epic ??  record.  If you have any questions please contact [phone].  ??  Nursing notes have been reviewed by the physician/ advanced practice ??  Clinician.

## 2018-08-06 NOTE — Progress Notes (Signed)
Problem: Nausea/Vomiting (Adult)  Goal: *Absence of nausea/vomiting  Outcome: Progressing Towards Goal  Prop the bed up  Administer the prescribed Anti emetic

## 2018-08-06 NOTE — Consults (Addendum)
A member of [facility] [place], [place]                   Consult Note    Patient:  Travis [name]  Date of Birth:  02-03-1991  Age: 28 y.o.  Sex: male  PCP: None  MRN: [id number]  IMPRESSION:   1. N/v, abd pain, diarrhea (nonbloody). CT scan w/ numerous fluid filled loops of small and lg bowel w/ borderline diffuse small bowel wall thickening-concerning for infectious or inflammatory enterocolitis-progressed from prior CT -r/o IBD  ?? Admit recently for similar symptoms, CT w/ enteritis. Stool cultures were neg.   ?? COVID neg 5/27  2. Elevated LFTS--Liver unremarkable on CT imaging. Viral panel neg   3. AKI - dehyrdation from diarrhea?  4. HTN  5. Asthma  6. Bell's palsy  Patient Active Problem List    Diagnosis Date Noted   ??? AKI (acute kidney injury) ([place]) 07/22/2018      RECOMMENDATIONS:     ?? Inflammatory markers, ESR, sed rate, fecal cal protectin, Giardia, HIV  ?? Trial steroids  ?? Sips water/ice chips for now  ?? Acid suppression/antiemetics   ?? CS when pt can tol prep-need to r/o IBD      I have seen and examined the patient independently. The case and chart were reviewed, including all pertinent lab and imaging results. I agree with Ms. Brownell's assessment and plan.  28 y/o man recently discharged now re-admitted with RLQ abdominal pain, N/V and nonbloody diarrhea and 25lbs weight loss x 1 month.  WBC count is borderline low.  He has enteritis on CT, ddx infectious vs IBD, however it has progressed over the past 2 weeks.   Pt is homosexual, will check HIV status and stool for less common pathogens.  Will treat empirically with [name] steroids, and if related to Crohn's we should see improvement over next 3 days.    Bea Graff, M.D.   [facility] Office: [phone]     HPI:     We were asked by ER to evaluate Travis [name] for colitis. He  is a 28 y.o. male admitted on 08/06/2018 for No admission diagnoses are documented for this encounter.Marland Kitchen    PMH as listed above presented w/ n/v/d. Recently admitted for same, stool studies were neg at the time. CT on return to ER w/ worsening enterocolitis. Reports nonbloody stool 5-10 day, n/v w/o hematemesis, diffuse abd pain, weakness,fatigue. Unable to tol PO.wt loss of ~13lbs.  AF, wbc nml. hgb 18-likely hemoconcentration.        No Known Allergies    Prior to Admission Medications   Prescriptions Last Dose Informant Patient Reported? Taking?   albuterol (PROAIR HFA) 90 mcg/actuation inhaler   No No   Sig: Take 1 Puff by inhalation every four (4) hours as needed for Wheezing.   dicyclomine (BENTYL) 10 mg capsule   No No   Sig: Take 1 Cap by mouth four (4) times daily.   omeprazole (PRILOSEC) 40 mg capsule   No No   Sig: Take 1 Cap by mouth Daily (before breakfast).      Facility-Administered Medications: None       Current Facility-Administered Medications   Medication Dose Route Frequency Provider Last Rate Last Dose   ??? pantoprazole (PROTONIX) 40 mg in 0.9% sodium chloride 10 mL injection  40 mg IntraVENous ONCE Jonnie Kind, DO         Current Outpatient Medications  Medication Sig Dispense Refill   ??? dicyclomine (BENTYL) 10 mg capsule Take 1 Cap by mouth four (4) times daily. 15 Cap 0   ??? omeprazole (PRILOSEC) 40 mg capsule Take 1 Cap by mouth Daily (before breakfast). 30 Cap 0   ??? albuterol (PROAIR HFA) 90 mcg/actuation inhaler Take 1 Puff by inhalation every four (4) hours as needed for Wheezing. 1 Inhaler 0        Past Medical History:   Diagnosis Date   ??? Asthma    ??? Hypertension    ??? Neurological disorder     Bell's palsy       History reviewed. No pertinent surgical history.    History reviewed. No pertinent family history.    Social History     Tobacco Use   ??? Smoking status: Never Smoker   ??? Smokeless tobacco: Never Used   Substance Use Topics   ??? Alcohol use: No   ??? Drug use: No        Pertinent items are noted in HPI.    Vitals:    08/06/18 1232 08/06/18 1235 08/06/18 1314 08/06/18 1332    BP: (!) 145/99   (!) 143/93   Pulse: 99  88 94   Resp: 21   17   Temp:       SpO2: 100% 100%  100%   Weight:       Height:           Physical Exam:  GENERAL: alert, cooperative, cachectic  EYE: negative findings: anicteric sclera and conjunctivae and sclerae normal  THROAT & NECK: normal and no erythema or exudates noted.   LUNG: clear to auscultation bilaterally  normal respiratory effort  HEART: regular rate and rhythm, tacy  ABDOMEN: soft, tender diffuse, no rebound, nondistended, bowel sounds normoactive  EXTREMITIES: extremities normal, atraumatic, no cyanosis or edema, no edema, redness or tenderness in the calves or thighs  SKIN: Normal. and no rash or abnormalities  NEUROLOGIC: negative findings: alert, oriented x3  speech normal in context and clarity  RECTAL: deferred     DATA    CBC w/Diff   Lab Results   Component Value Date/Time    WBC 4.8 08/06/2018 11:14 AM    WBC 3.1 (L) 07/31/2018 06:27 AM    WBC 2.6 (L) 07/30/2018 06:57 AM    RBC 6.39 (H) 08/06/2018 11:14 AM    RBC 4.26 07/31/2018 06:27 AM    RBC 4.21 07/30/2018 06:57 AM    HGB 18.0 (H) 08/06/2018 11:14 AM    HGB 12.0 (L) 07/31/2018 06:27 AM    HGB 11.9 (L) 07/30/2018 06:57 AM    HCT 51.0 (H) 08/06/2018 11:14 AM    HCT 36.1 (L) 07/31/2018 06:27 AM    HCT 35.6 (L) 07/30/2018 06:57 AM    MCV 79.8 (L) 08/06/2018 11:14 AM    MCV 84.7 07/31/2018 06:27 AM    MCV 84.6 07/30/2018 06:57 AM    MCH 28.2 08/06/2018 11:14 AM    MCH 28.2 07/31/2018 06:27 AM    MCH 28.3 07/30/2018 06:57 AM    MCHC 35.3 08/06/2018 11:14 AM    MCHC 33.2 07/31/2018 06:27 AM    MCHC 33.4 07/30/2018 06:57 AM    PLT 152 08/06/2018 11:14 AM    PLT 115 (L) 07/31/2018 06:27 AM    PLT 113 (L) 07/30/2018 06:57 AM     Lab Results   Component Value Date/Time    BANDS 1.9 07/31/2018 06:27 AM    GRANS  63.9 08/06/2018 11:14 AM    GRANS 48.6 07/31/2018 06:27 AM    GRANS 55.2 07/30/2018 06:57 AM    MONOS 18.1 (H) 08/06/2018 11:14 AM    MONOS 14.6 (H) 07/31/2018 06:27 AM     MONOS 11.4 07/30/2018 06:57 AM    EOS 0.4 08/06/2018 11:14 AM    EOS 5.8 (H) 07/31/2018 06:27 AM    EOS 0.0 07/30/2018 06:57 AM    BASOS 0.6 08/06/2018 11:14 AM    BASOS 0.0 07/31/2018 06:27 AM    BASOS 1.0 07/30/2018 06:57 AM        Hepatic Function   Recent Labs     08/06/18  1114   ALT 82*   TBILI 1.0   AP 129*   ALB 4.3   TP 10.2*        Pancreatic Enzymes   Recent Labs     08/06/18  1114   LPSE 178        Basic Metabolic Profile   Recent Labs     08/06/18  1229 08/06/18  1114   NA  --  132*   K  --  3.1*   CL  --  104   CO2 11.0* 13*   BUN  --  75*   GLU  --  121*   CREA  --  2.1*   CA  --  9.0        POC Glucose  No components found for: [facility]    Coagulation   No results for input(s): PTP, INR, APTT, INREXT in the last 72 hours.       Stool FOB   Lab Results   Component Value Date/Time    Occult blood, stool NEGATIVE 07/28/2018 11:40 AM       Radiology Results  Ct Abd Pelv Wo Cont    Result Date: 08/06/2018  CT Abdomen and Pelvis without Indication: Abdominal pain. Nausea, vomiting, diarrhea. Comparison: 07/22/2018. TECHNIQUE: CT of the abdomen and pelvis WITHOUT intravenous contrast. Coronal and sagittal reformations were obtained. Oral contrast was given. All CT exams at this facility use one or more dose reduction techniques including automatic exposure control, mA/kV adjustment per patient's size, or iterative reconstruction technique. DICOM format imaged data is available to non-affiliated external healthcare facilities or entities on a secure, media free, reciprocal searchable basis with patient authorization for 12 months following the date of the study. DISCUSSION: ABSENCE OF INTRAVENOUS CONTRAST DECREASES SENSITIVITY FOR DETECTION OF FOCAL LESIONS AND VASCULAR PATHOLOGY. LOWER THORAX: Normal. HEPATOBILIARY: No focal hepatic lesions.  No biliary ductal dilatation. SPLEEN: No splenomegaly. PANCREAS: No focal masses or ductal dilatation. ADRENALS: No adrenal nodules.  KIDNEYS/URETERS: No hydronephrosis, stones, or solid mass lesions. PELVIC ORGANS/BLADDER: Unremarkable. PERITONEUM / RETROPERITONEUM: No free air or fluid. LYMPH NODES: No lymphadenopathy. VESSELS: Unremarkable. GI TRACT: Multiple nondistended fluid-filled loops of small bowel with questionable mild wall thickening. Fluid-filled colon with no significant wall thickening. No obstruction. Normal appendix. BONES AND SOFT TISSUES: No acute abnormality.     IMPRESSION: Numerous fluid-filled loops of small and large bowel with borderline diffuse small bowel wall thickening, concerning for infectious or inflammatory enterocolitis. Findings have slightly progressed since 07/22/2018.         Cheral Almas, NP-C  [facility]  Cell # [phone]  Office # [phone]  [address], [place]  [place], [place]

## 2018-08-06 NOTE — ED Notes (Signed)
Patient given a cup of water to drink

## 2018-08-06 NOTE — Progress Notes (Signed)
prelim code r note  ??  Last admission date 5/27-6/5    reason for last admission aki    MD lipps  ??  disposition at discharge home with [facility] follow up 6/11  ??  [place] of [place]  ??

## 2018-08-06 NOTE — ED Triage Notes (Signed)
Pt arrived to ER from [facility] for further of eval of NVD. Pt has recent dx of illeus.

## 2018-08-06 NOTE — ED Notes (Signed)
TRANSFER - OUT REPORT:    Verbal report given to [employer] r[facility]  [facility] (name) on Travis [name]  being transferred to [facility] (unit) for routine progression of care       Report consisted of patient???s Situation, Background, Assessment and   Recommendations(SBAR).     Information from the following report(s) SBAR, Kardex, ED Summary, MAR and Cardiac Rhythm sinus rythm  was reviewed with the receiving nurse.    Lines:   Peripheral [name] 08/06/18 Right Forearm (Active)        Opportunity for questions and clarification was provided.      Patient transported with:   Tech

## 2018-08-06 NOTE — Progress Notes (Signed)
[facility] Services: Medication History    Medication History Completed Prior to Order Reconciliation?  NO  If "no" and discrepancies were noted please contact attending physician or pharmacist to follow-up: N/A - Medication history was accurate at the time medications were ordered.    Information obtained from (list all that apply, 2 sources preferred): Patient and Other: [employer]   If a history was not reviewed directly with patient/caregiver please comment with the reason why: N/A     Antibiotic use in the last 90 days (3 months): NO      Missing Medication Identified  N/A  Number of medications: -  Indicate action taken: Updated Medication List N/A       Wrong Medication Identified YES   Number of medications: 1  Indicate action taken: Updated Medication List BENTYL 10 MG 1 CAP QID x 4 DAYS (07/31/18 - 08/04/18)       Wrong Dose/Interval/Route Identified N/A              Number of medications: -   Indicate action taken: Updated Medication List N/A         Is patient currently taking warfarin:  No        Medication Compliance Issues and/or Medication Concerns: N/A      Allergies: Patient has no known allergies.    Prior to Admission Medications:    Prior to Admission Medications   Prescriptions Last Dose Informant Patient Reported? Taking?   albuterol (PROAIR HFA) 90 mcg/actuation inhaler   No Yes   Sig: Take 1 Puff by inhalation every four (4) hours as needed for Wheezing.   omeprazole (PRILOSEC) 40 mg capsule   No Yes   Sig: Take 1 Cap by mouth Daily (before breakfast).      Facility-Administered Medications: None         Alexis N. Robinson, CPHT   Contact: [phone]

## 2018-08-06 NOTE — Progress Notes (Signed)
Code R Consult Received.    After Hours Consult:     No    Last admission dc date and dx     07/31/2018    AKI    What was the discharge plan     Home w TCC f/u    Now presents      abd pain N&V    Case Discussed with ED Physician:   N    Case Discussed with Attending Physician:    N    Appropriate for Admission:      Yes Inpatient     Anticipated Discharge Needs:     Home w TCC f/u    Disposition if discharged from the ED          Readmission interview done (epic)     N    Readmission assessment completed (compare AD)   Y    [facility] complete Y

## 2018-08-06 NOTE — Progress Notes (Signed)
Problem: Nausea/Vomiting (Adult)  Goal: *Absence of nausea/vomiting  Outcome: Not Progressing Towards Goal     Problem: Patient Education: Go to Patient Education Activity  Goal: Patient/Family Education  Outcome: Progressing Towards Goal     Problem: Pain  Goal: *Control of Pain  Outcome: Progressing Towards Goal     Problem: Patient Education: Go to Patient Education Activity  Goal: Patient/Family Education  Outcome: Not Progressing Towards Goal

## 2018-08-06 NOTE — ED Notes (Signed)
Notified lab to add on salicylate

## 2018-08-06 NOTE — H&P (Signed)
Admission History and Physical  Travis Kind D.O.     Patient: Travis [name] Age: 28 y.o. Sex: male    Date of Birth: 1990-08-14 Admit Date: 08/06/2018 Admit Doctor: No admitting provider for patient encounter.   MRN: [id number]  CSN: [id number]  PCP: None       Assessment / Plan     AKI, prerenal secondary to dehydration  Hyponatremia  Hypokalemia secondary to GI losses  Enterocolitis, suspect inflammatory bowel disease  Intractable nausea and vomiting  Intractable abdominal pain      Plan:  -Admit to medical floor  -Appreciate GI input, patient previously seen by Dr. Verne Spurr  -Maintain n.p.o. status with small sips of clears for now, patient previously noncompliant with discharge diet  -Continue Bentyl, Protonix, morphine and Zofran as needed  -Given 2 L normal saline bolus switched to D5 half-normal saline with potassium and monitor electrolytes  -Continue albuterol as needed  -Urine drug screen  -Prior stool studies and hepatitis panel were negative, LFTs show improvement today and lactic acid is fortunately normal      Diet: N p.o.  DVT ppx: SCDs      DISPO  -Pt to be admitted  at this time for reasons addressed above, continued hospitalization for ongoing assessment and treatment indicated     Anticipated Date of Discharge: 08/08/2018  Anticipated Disposition (home, SNF) : Home    Code Status: Full code    MPOA: Mother Elray Mcgregor [phone]      Chief Complaint:   Chief Complaint   Patient presents with   ??? Abdominal Pain   ??? Vomiting   ??? Diarrhea         HPI:   Travis [name] is a 28 y.o. year old male hospitalization from May 26 through June 5 for 10 days regarding intractable nausea and vomiting abdominal pain and LFTs.  Patient was diagnosed with enteritis, stool studies and hepatitis panel during that admission were negative, there is a suspicion for possible inflammatory bowel disease and at that time outpatient follow-up with GI was planned.  Patient was treated with  fluids, dietary changes.    Patient was home for approximately 2 days and states that he just never "got better ".  Reports initially stuck to clear liquid diets however mom states she tried to feed him a hot dog 2 days ago and he has had ongoing and worsening nausea vomiting abdominal pain since then.  He states he feels very weak and tired, feels like his mouth is always dry.  Has a steady state of nausea.  Took Bentyl at home without any relief.  Denies any blood in the vomit or in his stool.  Denies family history of inflammatory bowel disease.    ER course: CBC shows hemoglobin of 18 likely secondary to dehydration.  Metabolic work-up shows sodium 132 potassium 3.1 BUN of 75 and creatinine of 2.1 also consistent with prerenal dehydration.  Lactic acid normal.  CT of the abdomen and pelvis reveals numerous fluid-filled loops of small and large bowel with borderline diffuse small bowel wall thickening concerning for infectious or inflammatory enterocolitis.  Findings somewhat progressed from paired study May 27.  Patient was given 2 L normal saline, 4 mg Zofran.  GI consulted.      Review of Systems -   Review of Systems   Constitutional: Positive for malaise/fatigue. Negative for chills and fever.   HENT: Negative for congestion, ear pain, sinus pain and sore throat.  Eyes: Negative for blurred vision and photophobia.   Respiratory: Negative for cough, shortness of breath and wheezing.    Cardiovascular: Negative for chest pain, palpitations and leg swelling.   Gastrointestinal: Positive for abdominal pain, diarrhea, nausea and vomiting. Negative for constipation and heartburn.   Genitourinary: Negative for dysuria and flank pain.   Musculoskeletal: Negative for myalgias.   Skin: Negative for itching and rash.   Neurological: Positive for weakness. Negative for dizziness and headaches.   Endo/Heme/Allergies: Negative for polydipsia.   Psychiatric/Behavioral: Negative for depression and suicidal ideas. The  patient is not nervous/anxious and does not have insomnia.          Past Medical History:  Past Medical History:   Diagnosis Date   ??? Asthma    ??? Hypertension    ??? Neurological disorder     Bell's palsy       Past Surgical History:  History reviewed. No pertinent surgical history.    Family History:  History reviewed. No pertinent family history.    Social History:  Social History     Socioeconomic History   ??? Marital status: SINGLE     Spouse name: Not on file   ??? Number of children: Not on file   ??? Years of education: Not on file   ??? Highest education level: Not on file   Tobacco Use   ??? Smoking status: Never Smoker   ??? Smokeless tobacco: Never Used   Substance and Sexual Activity   ??? Alcohol use: No   ??? Drug use: No   ??? Sexual activity: Yes     Partners: Female     Birth control/protection: Condom       Home Medications:  Prior to Admission medications    Medication Sig Start Date End Date Taking? Authorizing Provider   dicyclomine (BENTYL) 10 mg capsule Take 1 Cap by mouth four (4) times daily. 07/31/18   Tonny Bollman, MD   omeprazole (PRILOSEC) 40 mg capsule Take 1 Cap by mouth Daily (before breakfast). 08/01/18   Tonny Bollman, MD   albuterol (PROAIR HFA) 90 mcg/actuation inhaler Take 1 Puff by inhalation every four (4) hours as needed for Wheezing. 01/27/16   Ward, Shirley Friar, NP       Allergies:  No Known Allergies      Physical Exam:     Visit Vitals  BP (!) 143/93   Pulse 94   Temp 97.4 ??F (36.3 ??C)   Resp 17   Ht '5\' 7"'$  (1.702 m)   Wt 64.9 kg (143 lb)   SpO2 100%   BMI 22.40 kg/m??     Physical Exam  Constitutional:       General: He is not in acute distress.     Appearance: He is not diaphoretic.   HENT:      Head: Normocephalic and atraumatic.      Right Ear: External ear normal.      Left Ear: External ear normal.      Nose: Nose normal.      Mouth/Throat:      Mouth: Mucous membranes are dry.      Dentition: Abnormal dentition.   Eyes:      General:         Right eye: No discharge.          Left eye: No discharge.      Conjunctiva/sclera: Conjunctivae normal.      Pupils: Pupils are equal, round, and reactive to light.  Neck:      Musculoskeletal: Neck supple.      Thyroid: No thyromegaly.      Vascular: No JVD.   Cardiovascular:      Rate and Rhythm: Normal rate and regular rhythm.      Heart sounds: Normal heart sounds. No murmur. No friction rub.   Pulmonary:      Effort: Pulmonary effort is normal. No respiratory distress.      Breath sounds: Normal breath sounds. No wheezing.   Abdominal:      General: Bowel sounds are normal. There is no distension.      Palpations: Abdomen is soft.      Tenderness: There is generalized abdominal tenderness. There is no guarding or rebound.   Musculoskeletal: Normal range of motion.   Skin:     General: Skin is warm and dry.   Neurological:      Mental Status: He is alert and oriented to person, place, and time.      Cranial Nerves: No cranial nerve deficit.      Coordination: Coordination normal.      Deep Tendon Reflexes: Reflexes are normal and symmetric.   Psychiatric:         Mood and Affect: Mood is depressed. Affect is flat.         Cognition and Memory: Memory normal.         Judgment: Judgment normal.         Intake and Output:  Current Shift:  06/11 0701 - 06/11 1900  In: 2000 [I.V.:2000]  Out: -   Last three shifts:  No intake/output data recorded.    Lab/Data Reviewed:  Recent Results (from the past 24 hour(s))   CBC WITH AUTOMATED DIFF    Collection Time: 08/06/18 11:14 AM   Result Value Ref Range    WBC 4.8 4.0 - 11.0 1000/mm3    RBC 6.39 (H) 3.80 - 5.70 M/uL    HGB 18.0 (H) 12.4 - 17.2 gm/dl    HCT 51.0 (H) 37.0 - 50.0 %    MCV 79.8 (L) 80.0 - 98.0 fL    MCH 28.2 23.0 - 34.6 pg    MCHC 35.3 30.0 - 36.0 gm/dl    PLATELET 152 140 - 450 1000/mm3    MPV 13.6 (H) 6.0 - 10.0 fL    RDW-SD 37.4 35.1 - 43.9      NRBC 0 0 - 0      IMMATURE GRANULOCYTES 0.2 0.0 - 3.0 %    NEUTROPHILS 63.9 34 - 64 %    LYMPHOCYTES 16.8 (L) 28 - 48 %     MONOCYTES 18.1 (H) 1 - 13 %    EOSINOPHILS 0.4 0 - 5 %    BASOPHILS 0.6 0 - 3 %   METABOLIC PANEL, COMPREHENSIVE    Collection Time: 08/06/18 11:14 AM   Result Value Ref Range    Sodium 132 (L) 136 - 145 mEq/L    Potassium 3.1 (L) 3.5 - 5.1 mEq/L    Chloride 104 98 - 107 mEq/L    CO2 13 (LL) 21 - 32 mEq/L    Glucose 121 (H) 74 - 106 mg/dl    BUN 75 (H) 7 - 25 mg/dl    Creatinine 2.1 (H) 0.6 - 1.3 mg/dl    GFR est AA 48.0      GFR est non-AA 40      Calcium 9.0 8.5 - 10.1 mg/dl    AST (SGOT) 45 (H) 15 -  37 U/L    ALT (SGPT) 82 (H) 12 - 78 U/L    Alk. phosphatase 129 (H) 45 - 117 U/L    Bilirubin, total 1.0 0.2 - 1.0 mg/dl    Protein, total 10.2 (H) 6.4 - 8.2 gm/dl    Albumin 4.3 3.4 - 5.0 gm/dl    Anion gap 15 5 - 15 mmol/L   LIPASE    Collection Time: 08/06/18 11:14 AM   Result Value Ref Range    Lipase 178 73 - 283 U/L   SALICYLATE    Collection Time: 08/06/18 11:14 AM   Result Value Ref Range    Salicylate <1.5 (L) 2.8 - 20.0 mg/dl   POC BLOOD GAS + LACTIC ACID    Collection Time: 08/06/18 12:29 PM   Result Value Ref Range    pH 7.315 (L) 7.350 - 7.450      PCO2 20.7 (LL) 35.0 - 45.0 mm Hg    PO2 102.0 (H) 75 - 100 mm Hg    BICARBONATE 10.6 (LL) 18.0 - 26.0 mmol/L    O2 SAT 98.0 90 - 100 %    CO2, TOTAL 11.0 (L) 24 - 29 mmol/L    Lactic Acid 0.75 0.40 - 2.00 mmol/L    BASE EXCESS -16 (L) -2 - 3 mmol/L    Patient temp. 97.4 F      Sample type Art      SITE L Radial      DEVICE Room Air      ALLENS TEST Pass         CT Results (most recent):  Results from Hospital Encounter encounter on 08/06/18   CT ABD PELV WO CONT    Narrative CT Abdomen and Pelvis without    Indication: Abdominal pain. Nausea, vomiting, diarrhea.    Comparison: 07/22/2018.    TECHNIQUE:   CT of the abdomen and pelvis WITHOUT intravenous contrast. Coronal and sagittal  reformations were obtained. Oral contrast was given.    All CT exams at this facility use one or more dose reduction techniques   including automatic exposure control, mA/kV adjustment per patient's size, or  iterative reconstruction technique. DICOM format imaged data is available to  non-affiliated external healthcare facilities or entities on a secure, media  free, reciprocal searchable basis with patient authorization for 12 months  following the date of the study.    DISCUSSION:    ABSENCE OF INTRAVENOUS CONTRAST DECREASES SENSITIVITY FOR DETECTION OF FOCAL  LESIONS AND VASCULAR PATHOLOGY.    LOWER THORAX: Normal.    HEPATOBILIARY: No focal hepatic lesions.  No biliary ductal dilatation.  SPLEEN: No splenomegaly.  PANCREAS: No focal masses or ductal dilatation.    ADRENALS: No adrenal nodules.  KIDNEYS/URETERS: No hydronephrosis, stones, or solid mass lesions.  PELVIC ORGANS/BLADDER: Unremarkable.    PERITONEUM / RETROPERITONEUM: No free air or fluid.  LYMPH NODES: No lymphadenopathy.  VESSELS: Unremarkable.    GI TRACT: Multiple nondistended fluid-filled loops of small bowel with  questionable mild wall thickening. Fluid-filled colon with no significant wall  thickening. No obstruction. Normal appendix.    BONES AND SOFT TISSUES: No acute abnormality.      Impression IMPRESSION:    Numerous fluid-filled loops of small and large bowel with borderline diffuse  small bowel wall thickening, concerning for infectious or inflammatory  enterocolitis. Findings have slightly progressed since 07/22/2018.         Travis Kind, DO    August 06, 2018  2:47 PM  Dragon medical dictation software was used for portions of this report.  Unintended voice transcription errors may have occurred.

## 2018-08-06 NOTE — Consults (Signed)
Consults  by Polo Riley, MD at 08/06/18 1452                Author: Polo Riley, MD  Service: Gastroenterology  Author Type: Physician       Filed: 08/06/18 1714  Date of Service: 08/06/18 1452  Status: Addendum          Editor: Polo Riley, MD (Physician)          Related Notes: Original Note by Othelia Pulling, NP (Nurse Practitioner) filed at 08/06/18  1544                          A member of [facility], [facility]                      Consult Note      Patient:  Travis [name]   Date of Birth:  02-13-1991   Age: 28 y.o.   Sex: male   PCP: None   MRN: [id number]     IMPRESSION:     1.  N/v, abd pain, diarrhea (nonbloody). CT scan w/ numerous fluid filled loops of small and lg bowel w/ borderline diffuse small bowel wall thickening-concerning  for infectious or inflammatory enterocolitis-progressed from prior CT -r/o IBD   ??  Admit recently for similar symptoms, CT w/ enteritis. Stool cultures were neg.    ??  COVID neg 5/27   2.  Elevated LFTS--Liver unremarkable on CT imaging. Viral panel neg    3.  AKI - dehyrdation from diarrhea?   4.  HTN   5.  Asthma   6.  Bell's palsy     Patient Active Problem List           Diagnosis  Date Noted         ?  AKI (acute kidney injury) ([place])  07/22/2018           RECOMMENDATIONS:        ??  Inflammatory markers, ESR, sed rate, fecal cal protectin, Giardia, HIV   ??  Trial steroids   ??  Sips water/ice chips for now   ??  Acid suppression/antiemetics    ??  CS when pt can tol prep-need to r/o IBD         I have seen and examined the patient independently. The case and chart were reviewed, including all pertinent lab and imaging results. I agree with Ms. Brownell's assessment and plan.  28 y/o  man recently discharged now re-admitted with RLQ abdominal pain, N/V and nonbloody diarrhea and 25lbs weight loss x 1 month.  WBC count is borderline low.  He has enteritis on CT, ddx infectious vs IBD, however it has progressed over  the past 2 weeks.    Pt is homosexual, will check HIV status and stool for less common pathogens.  Will treat empirically with [name] steroids, and if related to Crohn's we should see improvement over next 3 days.      Bea Graff, M.D.    [facility] [place] Office: [phone]         HPI:        We were asked by ER to evaluate Travis [name] for colitis. He   is a 28 y.o. male admitted on  08/06/2018 for No admission diagnoses are documented for this encounter.Marland Kitchen    PMH as listed above presented w/  n/v/d. Recently admitted for same, stool studies were neg at the time. CT on return to ER w/ worsening enterocolitis. Reports nonbloody stool 5-10 day, n/v w/o hematemesis, diffuse abd pain, weakness,fatigue. Unable to  tol PO.wt loss of ~13lbs.   AF, wbc nml. hgb 18-likely hemoconcentration.           No Known Allergies        Prior to Admission Medications     Prescriptions  Last Dose  Informant  Patient Reported?  Taking?      albuterol (PROAIR HFA) 90 mcg/actuation inhaler      No  No      Sig: Take 1 Puff by inhalation every four (4) hours as needed for Wheezing.      dicyclomine (BENTYL) 10 mg capsule      No  No      Sig: Take 1 Cap by mouth four (4) times daily.      omeprazole (PRILOSEC) 40 mg capsule      No  No      Sig: Take 1 Cap by mouth Daily (before breakfast).               Facility-Administered Medications: None             Current Facility-Administered Medications             Medication  Dose  Route  Frequency  Provider  Last Rate  Last Dose              ?  pantoprazole (PROTONIX) 40 mg in 0.9% sodium chloride 10 mL injection   40 mg  IntraVENous  ONCE  Hutchinson, Anne, DO                Current Outpatient Medications          Medication  Sig  Dispense  Refill           ?  dicyclomine (BENTYL) 10 mg capsule  Take 1 Cap by mouth four (4) times daily.  15 Cap  0     ?  omeprazole (PRILOSEC) 40 mg capsule  Take 1 Cap by mouth Daily (before breakfast).  30 Cap  0           ?   albuterol (PROAIR HFA) 90 mcg/actuation inhaler  Take 1 Puff by inhalation every four (4) hours as needed for Wheezing.  1 Inhaler  0              Past Medical History:        Diagnosis  Date         ?  Asthma       ?  Hypertension       ?  Neurological disorder            Bell's palsy           History reviewed. No pertinent surgical history.      History reviewed. No pertinent family history.        Social History          Tobacco Use         ?  Smoking status:  Never Smoker     ?  Smokeless tobacco:  Never Used       Substance Use Topics         ?  Alcohol use:  No         ?  Drug use:  No  Pertinent items are noted in HPI.        Vitals:             08/06/18 1232  08/06/18 1235  08/06/18 1314  08/06/18 1332           BP:  (!) 145/99      (!) 143/93     Pulse:  99    88  94     Resp:  21      17     Temp:             SpO2:  100%  100%    100%     Weight:                   Height:                   Physical Exam:   GENERAL: alert, cooperative, cachectic   EYE: negative findings: anicteric sclera and conjunctivae and sclerae normal   THROAT & NECK: normal and no erythema or exudates noted.    LUNG: clear to auscultation bilaterally   normal respiratory effort   HEART: regular rate and rhythm, tacy   ABDOMEN: soft, tender diffuse, no rebound, nondistended, bowel sounds normoactive   EXTREMITIES: extremities normal, atraumatic, no cyanosis or edema, no edema, redness or tenderness in the calves or thighs   SKIN: Normal. and no rash or abnormalities   NEUROLOGIC: negative findings: alert, oriented x3   speech normal in context and clarity   RECTAL: deferred       DATA        CBC w/Diff     Lab Results      Component  Value  Date/Time        WBC  4.8  08/06/2018 11:14 AM        WBC  3.1 (L)  07/31/2018 06:27 AM        WBC  2.6 (L)  07/30/2018 06:57 AM        RBC  6.39 (H)  08/06/2018 11:14 AM        RBC  4.26  07/31/2018 06:27 AM        RBC  4.21  07/30/2018 06:57 AM        HGB  18.0 (H)  08/06/2018 11:14 AM         HGB  12.0 (L)  07/31/2018 06:27 AM        HGB  11.9 (L)  07/30/2018 06:57 AM        HCT  51.0 (H)  08/06/2018 11:14 AM        HCT  36.1 (L)  07/31/2018 06:27 AM        HCT  35.6 (L)  07/30/2018 06:57 AM        MCV  79.8 (L)  08/06/2018 11:14 AM        MCV  84.7  07/31/2018 06:27 AM        MCV  84.6  07/30/2018 06:57 AM        MCH  28.2  08/06/2018 11:14 AM        MCH  28.2  07/31/2018 06:27 AM        MCH  28.3  07/30/2018 06:57 AM        MCHC  35.3  08/06/2018 11:14 AM        MCHC  33.2  07/31/2018 06:27 AM        MCHC  33.4  07/30/2018 06:57 AM        PLT  152  08/06/2018 11:14 AM        PLT  115 (L)  07/31/2018 06:27 AM        PLT  113 (L)  07/30/2018 06:57 AM           Lab Results      Component  Value  Date/Time        BANDS  1.9  07/31/2018 06:27 AM        GRANS  63.9  08/06/2018 11:14 AM        GRANS  48.6  07/31/2018 06:27 AM        GRANS  55.2  07/30/2018 06:57 AM        MONOS  18.1 (H)  08/06/2018 11:14 AM        MONOS  14.6 (H)  07/31/2018 06:27 AM        MONOS  11.4  07/30/2018 06:57 AM        EOS  0.4  08/06/2018 11:14 AM        EOS  5.8 (H)  07/31/2018 06:27 AM        EOS  0.0  07/30/2018 06:57 AM        BASOS  0.6  08/06/2018 11:14 AM        BASOS  0.0  07/31/2018 06:27 AM        BASOS  1.0  07/30/2018 06:57 AM                  Hepatic Function     Recent Labs         08/06/18   1114      ALT  82*      TBILI  1.0      AP  129*      ALB  4.3      TP  10.2*                  Pancreatic Enzymes     Recent Labs         08/06/18   1114      LPSE  178                  Basic Metabolic Profile     Recent Labs         08/06/18   1229  08/06/18   1114      NA   --   132*      K   --   3.1*      CL   --   104      CO2  11.0*  13*      BUN   --   75*      GLU   --   121*      CREA   --   2.1*      CA   --   9.0                POC Glucose   No components found for: [facility]        Coagulation       No results for input(s): PTP, INR, APTT, INREXT in the last 72 hours.           Stool FOB      Lab Results          Component  Value  Date/Time  Occult blood, stool  NEGATIVE  07/28/2018 11:40 AM           Radiology Results   Ct Abd Pelv Wo Cont      Result Date: 08/06/2018   CT Abdomen and Pelvis without Indication: Abdominal pain. Nausea, vomiting, diarrhea. Comparison: 07/22/2018. TECHNIQUE: CT of the abdomen and pelvis WITHOUT intravenous contrast. Coronal and sagittal reformations were obtained. Oral contrast was given.  All CT exams at this facility use one or more dose reduction techniques including automatic exposure control, mA/kV adjustment per patient's size, or iterative reconstruction technique. DICOM format imaged data is available to non-affiliated external  healthcare facilities or entities on a secure, media free, reciprocal searchable basis with patient authorization for 12 months following the date of the study. DISCUSSION: ABSENCE OF INTRAVENOUS CONTRAST DECREASES SENSITIVITY FOR DETECTION OF FOCAL LESIONS  AND VASCULAR PATHOLOGY. LOWER THORAX: Normal. HEPATOBILIARY: No focal hepatic lesions.  No biliary ductal dilatation. SPLEEN: No splenomegaly. PANCREAS: No focal masses or ductal dilatation. ADRENALS: No adrenal nodules. KIDNEYS/URETERS: No hydronephrosis,  stones, or solid mass lesions. PELVIC ORGANS/BLADDER: Unremarkable. PERITONEUM / RETROPERITONEUM: No free air or fluid. LYMPH NODES: No lymphadenopathy. VESSELS: Unremarkable. GI TRACT: Multiple nondistended fluid-filled loops of small bowel with questionable  mild wall thickening. Fluid-filled colon with no significant wall thickening. No obstruction. Normal appendix. BONES AND SOFT TISSUES: No acute abnormality.       IMPRESSION: Numerous fluid-filled loops of small and large bowel with borderline diffuse small bowel wall thickening, concerning for infectious or inflammatory enterocolitis. Findings have slightly progressed since 07/22/2018.             Cheral Almas, NP-C   [facility]   Cell # [phone]   Office #  [phone]   [address] [place]   [place], [place]

## 2018-08-06 NOTE — H&P (Signed)
H&P by Jonnie Kind, DO at 08/06/18 1447                Author: Jonnie Kind, DO  Service: Hospitalist  Author Type: Physician       Filed: 08/06/18 1716  Date of Service: 08/06/18 1447  Status: Signed          Editor: Jonnie Kind, DO (Physician)                          Admission History and Physical   [name]           Patient: Travis [name]  Age: 28 y.o.  Sex: male          Date of Birth: 28-Oct-1990  Admit Date: 08/06/2018  Admit Doctor: No admitting provider for patient encounter.         MRN: [id number]   CSN: [id number]   PCP: None             Assessment / Plan        AKI, prerenal secondary to dehydration   Hyponatremia   Hypokalemia secondary to GI losses   Enterocolitis, suspect inflammatory bowel disease   Intractable nausea and vomiting   Intractable abdominal pain         Plan:   -Admit to medical floor   -Appreciate GI input, patient previously seen by Dr. Verne Spurr   -Maintain n.p.o. status with small sips of clears for now, patient previously noncompliant with discharge diet   -Continue Bentyl, Protonix, morphine and Zofran as needed   -Given 2 L normal saline bolus switched to D5 half-normal saline with potassium and monitor electrolytes   -Continue albuterol as needed   -Urine drug screen   -Prior stool studies and hepatitis panel were negative, LFTs show improvement today and lactic acid is fortunately normal         Diet: N p.o.   DVT ppx: SCDs         DISPO   -Pt to be admitted  at this time for reasons addressed above, continued hospitalization for ongoing assessment and treatment indicated       Anticipated Date of Discharge: 08/08/2018   Anticipated Disposition (home, SNF) : Home      Code Status: Full code      MPOA: Mother Travis [name] [phone]         Chief Complaint:      Chief Complaint       Patient presents with        ?  Abdominal Pain     ?  Vomiting        ?  Diarrhea                HPI:     Travis [name] is a 28 y.o.  year old male hospitalization  from May 26 through June 5 for 10 days regarding intractable nausea and vomiting abdominal pain and LFTs.  Patient  was diagnosed with enteritis, stool studies and hepatitis panel during that admission were negative, there is a suspicion for possible inflammatory bowel disease and at that time outpatient follow-up with GI was planned.  Patient was treated with fluids,  dietary changes.      Patient was home for approximately 2 days and states that he just never "got better ".  Reports initially stuck to clear liquid diets however mom states she tried to feed him a hot dog 2  days ago and he has had ongoing and worsening nausea vomiting abdominal  pain since then.  He states he feels very weak and tired, feels like his mouth is always dry.  Has a steady state of nausea.  Took Bentyl at home without any relief.  Denies any blood in the vomit or in his stool.  Denies family history of inflammatory  bowel disease.      ER course: CBC shows hemoglobin of 18 likely secondary to dehydration.  Metabolic work-up shows sodium 132 potassium 3.1 BUN of 75 and creatinine  of 2.1 also consistent with prerenal dehydration.  Lactic acid normal.  CT of the abdomen and pelvis reveals numerous fluid-filled loops of small and large bowel with borderline diffuse small bowel wall thickening concerning for infectious or inflammatory  enterocolitis.  Findings somewhat progressed from paired study May 27.  Patient was given 2 L normal saline, 4 mg Zofran.  GI consulted.         Review of Systems -    Review of Systems    Constitutional: Positive for malaise/fatigue. Negative for chills and fever.    HENT: Negative for congestion, ear pain, sinus pain and sore throat.     Eyes: Negative for blurred vision and photophobia.    Respiratory: Negative for cough, shortness of breath and wheezing.     Cardiovascular: Negative for chest pain, palpitations and leg swelling.    Gastrointestinal: Positive for abdominal pain, diarrhea , nausea and  vomiting.  Negative for constipation and heartburn.    Genitourinary: Negative for dysuria and flank pain.    Musculoskeletal: Negative for myalgias.    Skin: Negative for itching and rash.    Neurological: Positive for weakness. Negative for dizziness and headaches.    Endo/Heme/Allergies: Negative for polydipsia.    Psychiatric/Behavioral: Negative for depression and suicidal ideas. The patient is not nervous/anxious and does not have insomnia.              Past Medical History:     Past Medical History:        Diagnosis  Date         ?  Asthma       ?  Hypertension       ?  Neurological disorder            Bell's palsy           Past Surgical History:   History reviewed. No pertinent surgical history.      Family History:   History reviewed. No pertinent family history.      Social History:     Social History          Socioeconomic History         ?  Marital status:  SINGLE              Spouse name:  Not on file         ?  Number of children:  Not on file     ?  Years of education:  Not on file     ?  Highest education level:  Not on file       Tobacco Use         ?  Smoking status:  Never Smoker     ?  Smokeless tobacco:  Never Used       Substance and Sexual Activity         ?  Alcohol use:  No     ?  Drug use:  No     ?  Sexual activity:  Yes              Partners:  Female              Birth control/protection:  Condom           Home Medications:     Prior to Admission medications             Medication  Sig  Start Date  End Date  Taking?  Authorizing Provider            dicyclomine (BENTYL) 10 mg capsule  Take 1 Cap by mouth four (4) times daily.  07/31/18      Tonny Bollman, MD     omeprazole (PRILOSEC) 40 mg capsule  Take 1 Cap by mouth Daily (before breakfast).  08/01/18      Tonny Bollman, MD            albuterol (PROAIR HFA) 90 mcg/actuation inhaler  Take 1 Puff by inhalation every four (4) hours as needed for Wheezing.  01/27/16      Ward, Shirley Friar, NP           Allergies:   No Known Allergies            Physical Exam:        Visit Vitals      BP  (!) 143/93     Pulse  94     Temp  97.4 ??F (36.3 ??C)     Resp  17     Ht  5' 7"  (1.702 m)     Wt  64.9 kg (143 lb)     SpO2  100%        BMI  22.40 kg/m??        Physical Exam   Constitutional :        General: He is not in acute distress.     Appearance: He is not diaphoretic.    HENT:       Head: Normocephalic and atraumatic.      Right Ear: External ear normal.      Left Ear: External ear normal.      Nose: Nose normal.      Mouth/Throat:      Mouth: Mucous membranes are  dry.      Dentition: Abnormal dentition.   Eyes:       General:         Right eye: No discharge.         Left eye: No discharge.      Conjunctiva/sclera: Conjunctivae normal.      Pupils: Pupils are equal, round, and reactive to light.   Neck:       Musculoskeletal: Neck supple.      Thyroid: No thyromegaly.      Vascular: No JVD.    Cardiovascular:       Rate and Rhythm: Normal rate and regular rhythm.      Heart sounds: Normal heart sounds. No murmur. No friction rub.    Pulmonary:       Effort: Pulmonary effort is normal. No respiratory distress.      Breath sounds: Normal breath sounds. No wheezing.    Abdominal:      General: Bowel sounds are normal. There is no distension.      Palpations: Abdomen is soft.      Tenderness: There is  generalized abdominal tenderness. There is no  guarding or rebound.     Musculoskeletal: Normal range of motion.    Skin:      General: Skin is warm and dry.   Neurological :       Mental Status: He is alert and oriented to person, place, and time.      Cranial Nerves: No cranial nerve deficit.      Coordination: Coordination normal.      Deep Tendon Reflexes: Reflexes are normal and symmetric.   Psychiatric:          Mood and Affect: Mood is depressed. Affect is flat .         Cognition and Memory: Memory normal.         Judgment: Judgment normal.             Intake and Output:   Current Shift:  06/11 0701 - 06/11 1900   In: 2000 [I.V.:2000]   Out: -    Last three  shifts:  No intake/output data recorded.      Lab/Data Reviewed:     Recent Results (from the past 24 hour(s))     CBC WITH AUTOMATED DIFF          Collection Time: 08/06/18 11:14 AM         Result  Value  Ref Range            WBC  4.8  4.0 - 11.0 1000/mm3       RBC  6.39 (H)  3.80 - 5.70 M/uL       HGB  18.0 (H)  12.4 - 17.2 gm/dl       HCT  51.0 (H)  37.0 - 50.0 %       MCV  79.8 (L)  80.0 - 98.0 fL       MCH  28.2  23.0 - 34.6 pg       MCHC  35.3  30.0 - 36.0 gm/dl       PLATELET  152  140 - 450 1000/mm3       MPV  13.6 (H)  6.0 - 10.0 fL       RDW-SD  37.4  35.1 - 43.9         NRBC  0  0 - 0         IMMATURE GRANULOCYTES  0.2  0.0 - 3.0 %       NEUTROPHILS  63.9  34 - 64 %       LYMPHOCYTES  16.8 (L)  28 - 48 %       MONOCYTES  18.1 (H)  1 - 13 %       EOSINOPHILS  0.4  0 - 5 %       BASOPHILS  0.6  0 - 3 %       METABOLIC PANEL, COMPREHENSIVE          Collection Time: 08/06/18 11:14 AM         Result  Value  Ref Range            Sodium  132 (L)  136 - 145 mEq/L       Potassium  3.1 (L)  3.5 - 5.1 mEq/L       Chloride  104  98 - 107 mEq/L       CO2  13 (LL)  21 - 32 mEq/L       Glucose  121 (H)  74 - 106 mg/dl  BUN  75 (H)  7 - 25 mg/dl       Creatinine  2.1 (H)  0.6 - 1.3 mg/dl       GFR est AA  48.0          GFR est non-AA  40          Calcium  9.0  8.5 - 10.1 mg/dl       AST (SGOT)  45 (H)  15 - 37 U/L       ALT (SGPT)  82 (H)  12 - 78 U/L       Alk. phosphatase  129 (H)  45 - 117 U/L       Bilirubin, total  1.0  0.2 - 1.0 mg/dl       Protein, total  10.2 (H)  6.4 - 8.2 gm/dl       Albumin  4.3  3.4 - 5.0 gm/dl       Anion gap  15  5 - 15 mmol/L       LIPASE          Collection Time: 08/06/18 11:14 AM         Result  Value  Ref Range            Lipase  178  73 - 016 U/L       SALICYLATE          Collection Time: 08/06/18 11:14 AM         Result  Value  Ref Range            Salicylate  <0.1 (L)  2.8 - 20.0 mg/dl       POC BLOOD GAS + LACTIC ACID          Collection Time: 08/06/18 12:29 PM         Result   Value  Ref Range            pH  7.315 (L)  7.350 - 7.450         PCO2  20.7 (LL)  35.0 - 45.0 mm Hg       PO2  102.0 (H)  75 - 100 mm Hg       BICARBONATE  10.6 (LL)  18.0 - 26.0 mmol/L       O2 SAT  98.0  90 - 100 %       CO2, TOTAL  11.0 (L)  24 - 29 mmol/L       Lactic Acid  0.75  0.40 - 2.00 mmol/L       BASE EXCESS  -16 (L)  -2 - 3 mmol/L       Patient temp.  97.4 F          Sample type  Art          SITE  L Radial          DEVICE  Room Air               ALLENS TEST  Pass              CT Results (most recent):     Results from Hospital Encounter encounter on 08/06/18     CT ABD PELV WO CONT           Narrative  CT Abdomen and Pelvis without      Indication: Abdominal pain. Nausea, vomiting, diarrhea.      Comparison: 07/22/2018.      TECHNIQUE:  CT of the abdomen and pelvis WITHOUT intravenous contrast. Coronal and sagittal   reformations were obtained. Oral contrast was given.      All CT exams at this facility use one or more dose reduction techniques   including automatic exposure control, mA/kV adjustment per patient's size, or   iterative reconstruction technique. DICOM format imaged data is available to   non-affiliated external healthcare facilities or entities on a secure, media   free, reciprocal searchable basis with patient authorization for 12 months   following the date of the study.      DISCUSSION:      ABSENCE OF INTRAVENOUS CONTRAST DECREASES SENSITIVITY FOR DETECTION OF FOCAL   LESIONS AND VASCULAR PATHOLOGY.      LOWER THORAX: Normal.      HEPATOBILIARY: No focal hepatic lesions.  No biliary ductal dilatation.   SPLEEN: No splenomegaly.   PANCREAS: No focal masses or ductal dilatation.      ADRENALS: No adrenal nodules.   KIDNEYS/URETERS: No hydronephrosis, stones, or solid mass lesions.   PELVIC ORGANS/BLADDER: Unremarkable.      PERITONEUM / RETROPERITONEUM: No free air or fluid.   LYMPH NODES: No lymphadenopathy.   VESSELS: Unremarkable.      GI TRACT: Multiple nondistended fluid-filled  loops of small bowel with   questionable mild wall thickening. Fluid-filled colon with no significant wall   thickening. No obstruction. Normal appendix.      BONES AND SOFT TISSUES: No acute abnormality.              Impression  IMPRESSION:      Numerous fluid-filled loops of small and large bowel with borderline diffuse   small bowel wall thickening, concerning for infectious or inflammatory   enterocolitis. Findings have slightly progressed since 07/22/2018.              Jonnie Kind, DO      August 06, 2018   2:47 PM      Dragon medical dictation software was used for portions of this report.   Unintended voice transcription errors may have occurred.

## 2018-08-06 NOTE — Progress Notes (Signed)
Problem: Nausea/Vomiting (Adult)  Goal: *Absence of nausea/vomiting  Outcome: Not Progressing Towards Goal     Problem: Patient Education: Go to Patient Education Activity  Goal: Patient/Family Education  Outcome: Progressing Towards Goal     Problem: Pain  Goal: *Control of Pain  Outcome: Progressing Towards Goal     Problem: Patient Education: Go to Patient Education Activity  Goal: Patient/Family Education  Outcome: Not Progressing Towards Goal

## 2018-08-06 NOTE — Progress Notes (Signed)
 Patient admitted on 08/06/2018 from ED from home with   Chief Complaint   Patient presents with   . Abdominal Pain   . Vomiting   . Diarrhea          The patient is being treated for    PMH:   Past Medical History:   Diagnosis Date   . Asthma    . Hypertension    . Neurological disorder     Bell's palsy        Treatment Team: Treatment Team: Attending Provider: Dwane [name] [name]; Primary Nurse: Travis [name]; Physician Assistant: Travis Raisin, PA; Tech: Travis [name]; Consulting Provider: Dwane Arlean, DO; Consulting Provider: Cleone Deward LABOR, MD      The patient [name] been admitted to the [name] 1 times in the past 12 months.    Previous 4 Admission Dates Admission and Discharge Diagnosis Interventions Barriers Disposition                                 Patient and Family/Caregivers Goals of Care: tx abd pain n/v    Caregivers Participating in Plan of Care/Discharge Plan with the patient: mom    Tentative dc plan: Home.     Anticipated DME needs for discharge: na    PRESCREENING COMPLETED FOR SNFna      Does the patient have appropriate clothing available to be worn at discharge? yes      The patient and care participants are willing to travel immediate area for discharge facility.  The patient and plan of care participants have been provided with a list of all available Rehab Facilities or Home Health agencies as applicable. CM will follow up with a list of facilities or agencies that are offer acceptance.    CM [name] disclosed any financial interest that Travis [name] may have with any facility or agency.    Anticipated Discharge Date: 6/14    The plan of care and discharge plan [name] been discussed with Travis Arlean, DO and all other appropriate providers and adjusted per interdisciplinary team recommendations and in discussion with the patient and the patient designated Care Plan Participants.    Barriers to Healthcare Success/ Readmission Risk Factors: Enterocolitis, aki    Consults:  Palliative Care  Consult Recommended: n  Transitional Care Clinic Referral: n  Transitional Nurse Navigator Referral: n  Oncology Navigator Referral: n  SW consulted: n  [facility] Consulted: n  Outside [name]/Community Resources Referrals and Collaboration: n    Food/Nutrition Needs:   n                   Dietician Consulted: n    RRAT Score: Low Risk            4       Total Score        4 Pt. Coverage (Medicare=5 , Medicaid, or Self-Pay=4)        Criteria that do not apply:    [name] Seen PCP in Last 6 Months (Yes=3, No=0)    Married. Living with Significant Other. Assisted Living. LTAC. SNF. or   Rehab    Patient Length of Stay (>5 days = 3)    IP Visits Last 12 Months (1-3=4, 4=9, >4=11)    Charlson Comorbidity Score (Age + Comorbid Conditions)           PCP: Travis [name], [name], MD . How do you get to your  doctor appointments?self    Specialists: none he states    Dialysis Unit: na    Pharmacy: [facility]. Are there any medications that you have trouble paying for? Any difficulty getting your medications?N    DME available at Home:na    Home O2 L Flow:  na            Home O2 Provider: na    Home Environment and Prior Level of Function: Lives at [address]  [place] [address] @HOMEPHONE @. Lives with mom he states.  Multistory 3 Steps into home.  Responsibilities at home include adls    Prior to admission open services: na    Home Health Agency-  Personal Care Agency-    Extended Emergency Contact Information  Primary Emergency Contact: Travis [name]   [place]  Home Phone: [phone]  Work Phone: [phone]  Mobile Phone: [phone]  Relation: Mother     Transportation: family will transport home    Therapy Recommendations:    OT = n    PT = n    SLP =  n     RT Home O2 Evaluation =  n    Wound Care =  n    Case Management Assessment    ABUSE/NEGLECT SCREENING   Physical Abuse/Neglect: Denies   Sexual Abuse: Denies   Sexual Abuse: Denies   Other Abuse/Issues:  Denies          PRIMARY DECISION MAKER                                   CARE MANAGEMENT INTERVENTIONS   Readmission Interview Completed: Not Applicable   PCP Verified by CM: Yes(Travis [name])           Mode of Transport at Discharge: Other (see comment)(mother)       Transition of Care Consult (CM Consult): Discharge Planning           MyChart Signup: No   Discharge Durable Medical Equipment: No   Physical Therapy Consult: No   Occupational Therapy Consult: No   Speech Therapy Consult: No   Current Support Network: Relative's Home, Family Lives Nearby   Reason for Referral: DCP Rounds   History Provided By: Patient, Medical Record   Patient Orientation: Alert and Oriented, Person, Place, Situation, Self   Cognition: Alert   Support System Response: Concerned, Cooperative   Previous Living Arrangement: Lives with Family Independent   Home Accessibility: Steps, Multi Level Home   Prior Functional Level: Independent in ADLs/IADLs   Current Functional Level: Independent in ADLs/IADLs       Can patient return to prior living arrangement: Yes   Ability to make needs known:: Good   Family able to assist with home care needs:: Yes               Types of Needs Identified: Treatment Education, Disease Management Education       Confirm Follow Up Transport: Family                  DISCHARGE LOCATION   Discharge Placement: Home

## 2018-08-06 NOTE — Progress Notes (Signed)
Code R Consult Received.    After Hours Consult:     No    Last admission dc date and dx     07/31/2018    AKI    What was the discharge plan     Home w TCC f/u    Now presents      abd pain N&V    Case Discussed with ED Physician:   N    Case Discussed with Attending Physician:    N    Appropriate for Admission:      Yes Inpatient     Anticipated Discharge Needs:     Home w TCC f/u    Disposition if discharged from the ED          Readmission interview done (epic)     N    Readmission assessment completed (compare AD)   Mikael Spray complete Y

## 2018-08-06 NOTE — ED Notes (Signed)
 TRANSFER - OUT REPORT:    Verbal report given to [employer] r[facility]  [facility] west (name) on Travis [name]  being transferred to [facility] west (unit) for routine progression of care       Report consisted of patient's Situation, Background, Assessment and   Recommendations(SBAR).     Information from the following report(s) SBAR, Kardex, ED Summary, [facility] and Cardiac Rhythm sinus rythm  was reviewed with the receiving nurse.    Lines:   Peripheral [name] 08/06/18 Right Forearm (Active)        Opportunity for questions and clarification was provided.      Patient transported with:   [employer]

## 2018-08-06 NOTE — ED Notes (Signed)
Notified lab to add on salicylate

## 2018-08-06 NOTE — Progress Notes (Signed)
Problem: Nausea/Vomiting (Adult)  Goal: *Absence of nausea/vomiting  Outcome: Progressing Towards Goal  Prop the bed up  Administer the prescribed Anti emetic

## 2018-08-06 NOTE — Progress Notes (Signed)
 [facility] Pharmacy Services: Medication History    Medication History Completed Prior to Order Reconciliation?  NO  If no and discrepancies were noted please contact attending physician or pharmacist to follow-up: N/A - Medication history was accurate at the time medications were ordered.    Information obtained from (list all that apply, 2 sources preferred): Patient and Other: [employer]   If a history was not reviewed directly with patient/caregiver please comment with the reason why: N/A     Antibiotic use in the last 90 days (3 months): NO      Missing Medication Identified  N/A  Number of medications: -  Indicate action taken: Updated Medication List N/A       Wrong Medication Identified YES   Number of medications: 1  Indicate action taken: Updated Medication List BENTYL 10 MG 1 CAP QID x 4 DAYS (07/31/18 - 08/04/18)       Wrong Dose/Interval/Route Identified N/A              Number of medications: -   Indicate action taken: Updated Medication List N/A         Is patient currently taking warfarin:  No        Medication Compliance Issues and/or Medication Concerns: N/A      Allergies: Patient has no known allergies.    Prior to Admission Medications:    Prior to Admission Medications   Prescriptions Last Dose Informant Patient Reported? Taking?   albuterol (PROAIR HFA) 90 mcg/actuation inhaler   No Yes   Sig: Take 1 Puff by inhalation every four (4) hours as needed for Wheezing.   omeprazole (PRILOSEC) 40 mg capsule   No Yes   Sig: Take 1 Cap by mouth Daily (before breakfast).      Facility-Administered Medications: None         Alexis N. Lang, CPHT   Contact: [phone]

## 2018-08-06 NOTE — ED Provider Notes (Signed)
ED Provider Notes by Clerance Lav, [place] at 08/06/18 1310                Author: Clerance Lav, [place]  Service: Emergency Medicine  Author Type: Physician Assistant       Filed: 08/06/18 1737  Date of Service: 08/06/18 1310  Status: Attested           Editor: Clerance Lav, [place] (Physician Assistant)  Cosigner: Oneita Hurt, MD at 08/06/18 1805          Attestation signed by Oneita Hurt, MD at 08/06/18 1805          I have discussed the patient with the mid-level provider. I agree with their evaluation and treatment plan as documented here. I did not see this patient personally,  but I was available to see the patient at their request.                                    [facility]   Emergency Department Treatment Report          Patient: Travis [name]  Age: 28 y.o.  Sex: male          Date of Birth: [birth year]  Admit Date: 08/06/2018  PCP: Janell Quiet, MD     MRN: [id number]   CSN: [id number]   MD: Oneita Hurt, MD         Room: 5216/5216  Time Dictated: 1:10 PM  APP: Yalitza Teed        I hereby certify this patient for admission based upon medical necessity as ??   noted below:         Chief Complaint    Abdominal pain, vomiting, diarrhea     History of Present Illness     28 y.o. male  with history of hypertension, asthma and Bell's palsy presenting with periumbilical abdominal pain, multiple episodes of vomiting and diarrhea.  Says he is unable to keep any food or drink down.  Patient was admitted for 1-1/2 weeks for similar symptoms.   He had a KUB that showed an ileus and CT scan that showed enteritis.  Patient was discharged on omeprazole and Bentyl and says his pain has gotten worse.  His pain got worse the day he was discharged on June 5 and says he never felt better before being  discharged.  He has no mucus or blood in the stools.  He was negative for C. difficile and stool cultures were negative as well.  Says he also had hepatitis testing that was negative.   Denies chest pain, shortness of breath, cough or fever      Mother reports Sunday she tried to feed him a hot dog and he vomited this.  I asked her if she started a clear liquid diet and she says since then they have been trying clear liquids but patient continues to vomit and have diarrhea.  He is currently not  on antibiotics. He has lost 13 lbs in 2 weeks        Review of Systems     Review of Systems    Constitutional: Negative for chills, diaphoresis and fever.    HENT: Negative for congestion and sore throat.     Respiratory: Negative for cough and shortness of breath.     Cardiovascular: Negative for chest pain.    Gastrointestinal: Positive for abdominal pain, diarrhea ,  nausea and vomiting.  Negative for blood in stool.    Genitourinary: Negative for dysuria.    Musculoskeletal: Negative for back pain.    Skin: Negative for rash.    Neurological: Negative for sensory change, focal weakness, loss of consciousness and headaches.    Psychiatric/Behavioral: Negative for substance abuse.        All other systems reviewed and are negative.     Past Medical/Surgical History          Past Medical History:        Diagnosis  Date         ?  Asthma       ?  Hypertension       ?  Neurological disorder            Bell's palsy        History reviewed. No pertinent surgical history.        Social History          Social History          Socioeconomic History         ?  Marital status:  SINGLE              Spouse name:  Not on file         ?  Number of children:  Not on file     ?  Years of education:  Not on file     ?  Highest education level:  Not on file       Tobacco Use         ?  Smoking status:  Never Smoker     ?  Smokeless tobacco:  Never Used       Substance and Sexual Activity         ?  Alcohol use:  No     ?  Drug use:  No     ?  Sexual activity:  Yes              Partners:  Female              Birth control/protection:  Condom             Family History     No family history of ulcerative colitis,  diverticulitis or irritable bowel syndrome        Current Medications          Current Facility-Administered Medications             Medication  Dose  Route  Frequency  Provider  Last Rate  Last Dose              ?  albuterol (PROVENTIL VENTOLIN) nebulizer solution 2.5 mg   2.5 mg  Nebulization  Q4H PRN  Jonnie Kind, DO           ?  dicyclomine (BENTYL) capsule 10 mg   10 mg  Oral  QID  Hutchinson, Anne, DO     10 mg at 08/06/18 1713     ?  acetaminophen (TYLENOL) tablet 650 mg   650 mg  Oral  Q4H PRN  Jonnie Kind, DO                Or              ?  acetaminophen (TYLENOL) solution 650 mg   650 mg  Oral  Q4H PRN  Jonnie Kind, DO  Or              ?  acetaminophen (TYLENOL) suppository 650 mg   650 mg  Rectal  Q4H PRN  Hutchinson, Anne, DO           ?  naloxone (NARCAN) injection 0.1 mg   0.1 mg  IntraVENous  PRN  Hutchinson, Anne, DO           ?  dextrose 5% - 0.45% NaCl with KCl 20 mEq/L infusion   75 mL/hr  IntraVENous  CONTINUOUS  Hutchinson, Anne, DO  75 mL/hr at 08/06/18 1709  75 mL/hr at 08/06/18 1709     ?  morphine injection 2 mg   2 mg  IntraVENous  Q4H PRN  Jonnie Kind, DO     2 mg at 08/06/18 1719     ?  ondansetron (ZOFRAN) injection 4 mg   4 mg  IntraVENous  Q4H PRN  Hutchinson, Anne, DO                    ?  methylPREDNISolone (PF) (SOLU-MEDROL) injection 30 mg   30 mg  IntraVENous  Q12H  Cheral Almas A, NP     30 mg at 08/06/18 1656             Allergies     No Known Allergies        Physical Exam        Visit Vitals      BP  (!) 139/102 (BP 1 Location: Left arm)     Pulse  97     Temp  97.5 ??F (36.4 ??C)     Resp  21     Ht  5' 7" (1.702 m)     Wt  70.9 kg (156 lb 4.9 oz)     SpO2  100%        BMI  24.48 kg/m??        Physical Exam   Vitals signs reviewed.   Constitutional:        Appearance: He is ill-appearing. He is not diaphoretic.   HENT:       Head: Normocephalic and atraumatic.      Mouth/Throat:      Comments: Very dry and cracked lips, dry oropharynx    Eyes:       Conjunctiva/sclera: Conjunctivae normal.      Pupils: Pupils are equal, round, and reactive to light.    Neck:       Musculoskeletal: Normal range of motion and neck supple.   Cardiovascular :       Rate and Rhythm: Regular rhythm. Tachycardia present.      Heart sounds: Normal heart sounds.   Pulmonary:       Effort: Pulmonary effort is normal.      Breath sounds: Normal breath sounds. No wheezing.    Abdominal:      General: Bowel sounds are normal.      Palpations: Abdomen is soft.      Tenderness: There is generalized  abdominal tenderness. There is no guarding.     Musculoskeletal: Normal range of motion.          General: No deformity.     Lymphadenopathy:       Cervical: No cervical adenopathy.   Skin :      General: Skin is warm and dry.      Findings: No rash.    Neurological:  Mental Status: He is alert and oriented to person, place, and time.    Psychiatric:         Mood and Affect: Mood normal.         Cognition and Memory: Memory normal.               Impression and Management Plan     28 year old male presented to ED for generalized abdominal pain.  Recently admitted and discharged for similar symptoms with vomiting and diarrhea and continues to  have multiple episodes of both.  He is afebrile.  He does have some generalized tenderness on exam.  Will repeat labs.  He appears very dry, will give 2 L [name] fluids and Zofran        Diagnostic Studies     Lab:      Recent Results (from the past 12 hour(s))     CBC WITH AUTOMATED DIFF          Collection Time: 08/06/18 11:14 AM         Result  Value  Ref Range            WBC  4.8  4.0 - 11.0 1000/mm3       RBC  6.39 (H)  3.80 - 5.70 M/uL       HGB  18.0 (H)  12.4 - 17.2 gm/dl       HCT  51.0 (H)  37.0 - 50.0 %       MCV  79.8 (L)  80.0 - 98.0 fL       MCH  28.2  23.0 - 34.6 pg       MCHC  35.3  30.0 - 36.0 gm/dl       PLATELET  152  140 - 450 1000/mm3       MPV  13.6 (H)  6.0 - 10.0 fL       RDW-SD  37.4  35.1 - 43.9         NRBC  0  0 - 0          IMMATURE GRANULOCYTES  0.2  0.0 - 3.0 %       NEUTROPHILS  63.9  34 - 64 %       LYMPHOCYTES  16.8 (L)  28 - 48 %       MONOCYTES  18.1 (H)  1 - 13 %       EOSINOPHILS  0.4  0 - 5 %       BASOPHILS  0.6  0 - 3 %       METABOLIC PANEL, COMPREHENSIVE          Collection Time: 08/06/18 11:14 AM         Result  Value  Ref Range            Sodium  132 (L)  136 - 145 mEq/L            Potassium  3.1 (L)  3.5 - 5.1 mEq/L       Chloride  104  98 - 107 mEq/L       CO2  13 (LL)  21 - 32 mEq/L       Glucose  121 (H)  74 - 106 mg/dl       BUN  75 (H)  7 - 25 mg/dl       Creatinine  2.1 (H)  0.6 - 1.3 mg/dl       GFR est AA  48.0          GFR est non-AA  40          Calcium  9.0  8.5 - 10.1 mg/dl       AST (SGOT)  45 (H)  15 - 37 U/L       ALT (SGPT)  82 (H)  12 - 78 U/L       Alk. phosphatase  129 (H)  45 - 117 U/L       Bilirubin, total  1.0  0.2 - 1.0 mg/dl       Protein, total  10.2 (H)  6.4 - 8.2 gm/dl       Albumin  4.3  3.4 - 5.0 gm/dl       Anion gap  15  5 - 15 mmol/L       LIPASE          Collection Time: 08/06/18 11:14 AM         Result  Value  Ref Range            Lipase  178  73 - 831 U/L       SALICYLATE          Collection Time: 08/06/18 11:14 AM         Result  Value  Ref Range            Salicylate  <5.1 (L)  2.8 - 20.0 mg/dl       C REACTIVE PROTEIN, QT          Collection Time: 08/06/18 11:14 AM         Result  Value  Ref Range            C-Reactive protein  <2.9  0.0 - 2.9 mg/L       POC BLOOD GAS + LACTIC ACID          Collection Time: 08/06/18 12:29 PM         Result  Value  Ref Range            pH  7.315 (L)  7.350 - 7.450         PCO2  20.7 (LL)  35.0 - 45.0 mm Hg       PO2  102.0 (H)  75 - 100 mm Hg       BICARBONATE  10.6 (LL)  18.0 - 26.0 mmol/L       O2 SAT  98.0  90 - 100 %       CO2, TOTAL  11.0 (L)  24 - 29 mmol/L       Lactic Acid  0.75  0.40 - 2.00 mmol/L       BASE EXCESS  -16 (L)  -2 - 3 mmol/L       Patient temp.  97.4 F          Sample type  Art          SITE  L Radial          DEVICE   Room Air          ALLENS TEST  Pass          GLUCOSE, POC          Collection Time: 08/06/18  5:11 PM         Result  Value  Ref Range            Glucose (POC)  101  65 - 105  mg/dL           Imaging:     Ct Abd Pelv Wo Cont      Result Date: 08/06/2018   CT Abdomen and Pelvis without Indication: Abdominal pain. Nausea, vomiting, diarrhea. Comparison: 07/22/2018. TECHNIQUE: CT of the abdomen and pelvis WITHOUT intravenous contrast. Coronal and sagittal reformations were obtained. Oral contrast was given.  All CT exams at this facility use one or more dose reduction techniques including automatic exposure control, mA/kV adjustment per patient's size, or iterative reconstruction technique. DICOM format imaged data is available to non-affiliated external  healthcare facilities or entities on a secure, media free, reciprocal searchable basis with patient authorization for 12 months following the date of the study. DISCUSSION: ABSENCE OF INTRAVENOUS CONTRAST DECREASES SENSITIVITY FOR DETECTION OF FOCAL LESIONS  AND VASCULAR PATHOLOGY. LOWER THORAX: Normal. HEPATOBILIARY: No focal hepatic lesions.  No biliary ductal dilatation. SPLEEN: No splenomegaly. PANCREAS: No focal masses or ductal dilatation. ADRENALS: No adrenal nodules. KIDNEYS/URETERS: No hydronephrosis,  stones, or solid mass lesions. PELVIC ORGANS/BLADDER: Unremarkable. PERITONEUM / RETROPERITONEUM: No free air or fluid. LYMPH NODES: No lymphadenopathy. VESSELS: Unremarkable. GI TRACT: Multiple nondistended fluid-filled loops of small bowel with questionable  mild wall thickening. Fluid-filled colon with no significant wall thickening. No obstruction. Normal appendix. BONES AND SOFT TISSUES: No acute abnormality.       IMPRESSION: Numerous fluid-filled loops of small and large bowel with borderline diffuse small bowel wall thickening, concerning for infectious or inflammatory enterocolitis. Findings have slightly progressed since 07/22/2018.            Medical  Decision Making/ED Course     Patient is severely dehydrated.  AKI of 2.1 creatinine.  Patient had AKI on last admission, creatinine was 4.0 and then normalized at discharge.  Patient appears  severely dehydrated clinically.  Likely causing metabolic acidosis.  Discussed with patient needing admission again.  He has been given 2 L [name] fluid in ED which improved his tachycardia.  CT of abdomen shows slightly progressed enterocolitis.  Will consult  [facility] and GI Dr. Georgianne Fick Payman's group.  Likely needs to continue clear liquid diet.  His white count and lactic acid is normal.  Patient is stable for admission. No episode of diarrhea or vomiting in ED.      Patient is resting comfortably.   Spoke to Dr. Tonye Royalty who accepts for admission.  Spoke to nurse practitioner Caren Griffins who states Dr. Dante Gang is looking at the CT scans.  They will evaluate the patient before starting steroids.  Patient stable for admission.     Medications       albuterol (PROVENTIL VENTOLIN) nebulizer solution 2.5 mg (has no administration in time range)     dicyclomine (BENTYL) capsule 10 mg (10 mg Oral Given 08/06/18 1713)     acetaminophen (TYLENOL) tablet 650 mg (has no administration in time range)       Or     acetaminophen (TYLENOL) solution 650 mg (has no administration in time range)       Or     acetaminophen (TYLENOL) suppository 650 mg (has no administration in time range)     naloxone [facility]) injection 0.1 mg (has no administration in time range)     dextrose 5% - 0.45% NaCl with KCl 20 mEq/L infusion (75 mL/hr IntraVENous Rate Verify 08/06/18 1709)     morphine injection 2 mg (2 mg IntraVENous Given 08/06/18 1719)     ondansetron (ZOFRAN) injection 4 mg (has no  administration in time range)     methylPREDNISolone (PF) (SOLU-MEDROL) injection 30 mg (30 mg IntraVENous Given 08/06/18 1656)     sodium chloride (NS) flush 5-10 mL (10 mL IntraVENous Given 08/06/18 1124)     sodium chloride 0.9 % bolus infusion 1,000 mL (0 mL  IntraVENous [name] Completed 08/06/18 1242)     ondansetron (ZOFRAN) injection 4 mg (4 mg IntraVENous Given 08/06/18 1123)     sodium chloride 0.9 % bolus infusion 1,000 mL (0 mL IntraVENous [name] Completed 08/06/18 1421)       pantoprazole (PROTONIX) 40 mg in 0.9% sodium chloride 10 mL injection (40 mg IntraVENous Given 08/06/18 1504)          Final Diagnosis                 ICD-10-CM  ICD-9-CM          1.  AKI (acute kidney injury) ([place])  N17.9  584.9     2.  Enterocolitis  K52.9  558.9     3.  Non-intractable vomiting with nausea, unspecified vomiting type  R11.2  787.01     4.  Diarrhea, unspecified type  R19.7  787.91          5.  Dehydration  E86.0  276.51          Disposition        INPT MEDICAL               The patient was personally evaluated by myself and  [facility], Orlando Penner, MD who agrees with the above assessment and plan.      Clerance Lav, PA-C   August 06, 2018         *Portions of this electronic record were dictated using Systems analyst.  Unintended errors in translation may occur.      My signature above authenticates this document and my orders, the final ??   diagnosis (es), discharge prescription (s), and instructions in the Epic ??   record.   If you have any questions please contact [phone].   ??   Nursing notes have been reviewed by the physician/ advanced practice ??   Clinician.

## 2018-08-06 NOTE — ED Notes (Signed)
Pt arrived to ER from [facility] for further of eval of NVD. Pt has recent dx of illeus.

## 2018-08-06 NOTE — Progress Notes (Signed)
 prelim code r note    Last admission date 5/27-6/5    reason for last admission aki    MD lipps    disposition at discharge home with [facility]cc follow up 6/11    [place]

## 2018-08-06 NOTE — ED Notes (Signed)
Patient given a cup of water to drink.

## 2018-08-06 NOTE — ED Notes (Signed)
Patient reports of having abdominal pain that's has been there since he was discharged on Friday 07/31/2018    Reports of vomiting twice today, 5 episodes of diarrhea today.    Reports of getting relief from the abdominal pain from vomiting

## 2018-08-07 LAB — CBC WITH AUTO DIFFERENTIAL
Basophils %: 0.3 % (ref 0–3)
Eosinophils %: 0 % (ref 0–5)
Hematocrit: 44.3 % (ref 37.0–50.0)
Hemoglobin: 15.4 gm/dl (ref 12.4–17.2)
Immature Granulocytes: 0.3 % (ref 0.0–3.0)
Lymphocytes %: 31.8 % (ref 28–48)
MCH: 28.4 pg (ref 23.0–34.6)
MCHC: 34.8 gm/dl (ref 30.0–36.0)
MCV: 81.7 fL (ref 80.0–98.0)
MPV: 13.8 fL — ABNORMAL HIGH (ref 6.0–10.0)
Monocytes %: 13.1 % — ABNORMAL HIGH (ref 1–13)
Neutrophils %: 54.5 % (ref 34–64)
Nucleated RBCs: 0 (ref 0–0)
Platelets: 141 10*3/uL (ref 140–450)
RBC: 5.42 M/uL (ref 3.80–5.70)
RDW-SD: 38.6 (ref 35.1–43.9)
WBC: 2.9 10*3/uL — ABNORMAL LOW (ref 4.0–11.0)

## 2018-08-07 LAB — COMPREHENSIVE METABOLIC PANEL
ALT: 72 U/L (ref 12–78)
AST: 61 U/L — ABNORMAL HIGH (ref 15–37)
Albumin: 3.4 gm/dl (ref 3.4–5.0)
Alkaline Phosphatase: 107 U/L (ref 45–117)
Anion Gap: 8 mmol/L (ref 5–15)
BUN: 48 mg/dl — ABNORMAL HIGH (ref 7–25)
CO2: 18 mEq/L — ABNORMAL LOW (ref 21–32)
Calcium: 8.3 mg/dl — ABNORMAL LOW (ref 8.5–10.1)
Chloride: 107 mEq/L (ref 98–107)
Creatinine: 1.7 mg/dl — ABNORMAL HIGH (ref 0.6–1.3)
EGFR IF NonAfrican American: 51
GFR African American: >60
Glucose: 118 mg/dl — ABNORMAL HIGH (ref 74–106)
Potassium: 3.7 mEq/L (ref 3.5–5.1)
Sodium: 134 mEq/L — ABNORMAL LOW (ref 136–145)
Total Bilirubin: 0.9 mg/dl (ref 0.2–1.0)
Total Protein: 8.6 gm/dl — ABNORMAL HIGH (ref 6.4–8.2)

## 2018-08-07 LAB — DRUG SCREEN, URINE
Amphetamine: NEGATIVE
Amphetamines: NEGATIVE
Barbiturates: NEGATIVE
Barbiturates: NEGATIVE
Benzodiazapines: NEGATIVE
Benzodiazepines: NEGATIVE
Cocaine: NEGATIVE
Cocaine: NEGATIVE
Marijuana: NEGATIVE
Marijuana: NEGATIVE
Methadone: NEGATIVE
Methadone: NEGATIVE
Opiates: POSITIVE — AB
Opiates: POSITIVE — AB
Phencyclidine: NEGATIVE
Phencyclidine: NEGATIVE

## 2018-08-07 LAB — METABOLIC PANEL, COMPREHENSIVE
ALT (SGPT): 72 U/L (ref 12–78)
AST (SGOT): 61 U/L — ABNORMAL HIGH (ref 15–37)
Albumin: 3.4 gm/dl (ref 3.4–5.0)
Alk. phosphatase: 107 U/L (ref 45–117)
Anion gap: 8 mmol/L (ref 5–15)
BUN: 48 mg/dl — ABNORMAL HIGH (ref 7–25)
Bilirubin, total: 0.9 mg/dl (ref 0.2–1.0)
CO2: 18 mEq/L — ABNORMAL LOW (ref 21–32)
Calcium: 8.3 mg/dl — ABNORMAL LOW (ref 8.5–10.1)
Chloride: 107 mEq/L (ref 98–107)
Creatinine: 1.7 mg/dl — ABNORMAL HIGH (ref 0.6–1.3)
GFR est AA: >60
GFR est non-AA: 51
Glucose: 118 mg/dl — ABNORMAL HIGH (ref 74–106)
Potassium: 3.7 mEq/L (ref 3.5–5.1)
Protein, total: 8.6 gm/dl — ABNORMAL HIGH (ref 6.4–8.2)
Sodium: 134 mEq/L — ABNORMAL LOW (ref 136–145)

## 2018-08-07 LAB — CBC WITH AUTOMATED DIFF
BASOPHILS: 0.3 % (ref 0–3)
EOSINOPHILS: 0 % (ref 0–5)
HCT: 44.3 % (ref 37.0–50.0)
HGB: 15.4 gm/dl (ref 12.4–17.2)
IMMATURE GRANULOCYTES: 0.3 % (ref 0.0–3.0)
LYMPHOCYTES: 31.8 % (ref 28–48)
MCH: 28.4 pg (ref 23.0–34.6)
MCHC: 34.8 gm/dl (ref 30.0–36.0)
MCV: 81.7 fL (ref 80.0–98.0)
MONOCYTES: 13.1 % — ABNORMAL HIGH (ref 1–13)
MPV: 13.8 fL — ABNORMAL HIGH (ref 6.0–10.0)
NEUTROPHILS: 54.5 % (ref 34–64)
NRBC: 0 (ref 0–0)
PLATELET: 141 10*3/uL (ref 140–450)
RBC: 5.42 M/uL (ref 3.80–5.70)
RDW-SD: 38.6 (ref 35.1–43.9)
WBC: 2.9 10*3/uL — ABNORMAL LOW (ref 4.0–11.0)

## 2018-08-07 MED FILL — MORPHINE 2 MG/ML INJECTION: 2 mg/mL | INTRAMUSCULAR | Qty: 1

## 2018-08-07 MED FILL — DICYCLOMINE 10 MG CAP: 10 mg | ORAL | Qty: 1

## 2018-08-07 MED FILL — SOLU-MEDROL (PF) 40 MG/ML SOLUTION FOR INJECTION: 40 mg/mL | INTRAMUSCULAR | Qty: 1

## 2018-08-07 NOTE — Progress Notes (Signed)
Bedside and Verbal shift change report given to Lilian K (RN) (oncoming nurse) by Fraulein Z (RN) (offgoing nurse). Report included the following information SBAR, Kardex, Intake/Output and MAR.

## 2018-08-07 NOTE — Progress Notes (Signed)
Stool and Urine sample collected and sent to the Lab.

## 2018-08-07 NOTE — Progress Notes (Addendum)
A member of [facility] [place]                 GI Progress Note    Patient:  Travis [name]  Date of Birth:  05/13/1990  Age: 28 y.o.  Sex: male  PCP: Paulla ForeKapoor, [name], MD  MRN: [id number]                    Admit Date: 08/06/2018    Assessment:   1. N/v, abd pain, diarrhea (nonbloody). CT scan w/ numerous fluid filled loops of small and lg bowel w/ borderline diffuse small bowel wall thickening-concerning for infectious or inflammatory enterocolitis-progressed from prior CT -r/o IBD  ?? Admit recently for similar symptoms, CT w/ enteritis. Stool cultures were neg.   ?? CRP nml, LA nml, Sed rate 24  ?? COVID neg 5/27  2. Elevated LFTS--Liver unremarkable on CT imaging. Viral panel neg   3. AKI - dehyrdation from diarrhea?  4. HTN  5. Asthma  6. Bell's palsy    Plan:   ?? f/u fecal cal protectin, Giardia, HIV  ?? Cont [name] steroids  ?? Trial clears, advance to bland/low residue diet as tol   ?? Acid suppression/antiemetics   ?? CS when pt can tol prep-need to r/o IBD      I have seen and examined the patient independently. The case and chart were reviewed, including all pertinent lab and imaging results. I agree with Ms. Brownell's assessment and plan.  Minimally improved since starting steroids, certainly no worse.  Will continue to monitor clinically over the next couple of days.  Await remainder of stool studies and HIV.  Agree with colonoscopy early next week, timing TBD pending clinical course.    Dalia HeadingPaul Juelz Whittenberg, M.D.   [facility]   [place] Office: [phone]     Subjective:   Nausea overnight, denies currently  Abd pain-some improvement from yesterday  One BM overnight. No blood in stool  Objective:   Physical Exam:  Visit Vitals  BP 128/78 (BP Patient Position: Supine)   Pulse 89   Temp 97.5 ??F (36.4 ??C)   Resp 18   Ht 5\' 7"  (1.702 m)   Wt 70.9 kg (156 lb 4.9 oz)   SpO2 100%   BMI 24.48 kg/m??       Temp (24hrs), Avg:97.6 ??F (36.4 ??C), Min:97.3 ??F (36.3 ??C), Max:98.2 ??F (36.8 ??C)      Intake/Output Summary (Last 24 hours) at 08/07/2018 [id number]  Last data filed at 08/07/2018 [id number]  Gross per 24 hour   Intake 3213.75 ml   Output 850 ml   Net 2363.75 ml     GENERAL: alert, no distress  LUNG: normal respiratory effort  HEART: regular rate and rhythm  ABDOMEN: soft, ND, NABS, mild diffuse tenderness    Current Facility-Administered Medications   Medication Dose Route Frequency Provider Last Rate Last Dose   ??? albuterol (PROVENTIL VENTOLIN) nebulizer solution 2.5 mg  2.5 mg Nebulization Q4H PRN Hutchinson, Anne, DO       ??? dicyclomine (BENTYL) capsule 10 mg  10 mg Oral QID Hutchinson, Anne, DO   10 mg at 08/07/18 [id number]   ??? acetaminophen (TYLENOL) tablet 650 mg  650 mg Oral Q4H PRN Richarda OverlieHutchinson, Anne, DO        Or   ??? acetaminophen (TYLENOL) solution 650 mg  650 mg Oral Q4H PRN Hutchinson, Anne, DO        Or   ??? acetaminophen (  TYLENOL) suppository 650 mg  650 mg Rectal Q4H PRN Hutchinson, Anne, DO       ??? naloxone (NARCAN) injection 0.1 mg  0.1 mg IntraVENous PRN Hutchinson, Anne, DO       ??? dextrose 5% - 0.45% NaCl with KCl 20 mEq/L infusion  75 mL/hr IntraVENous CONTINUOUS Richarda OverlieHutchinson, Anne, DO 75 mL/hr at 08/07/18 0714 75 mL/hr at 08/07/18 0714   ??? morphine injection 2 mg  2 mg IntraVENous Q4H PRN Richarda OverlieHutchinson, Anne, DO   2 mg at 08/06/18 1719   ??? ondansetron (ZOFRAN) injection 4 mg  4 mg IntraVENous Q4H PRN Hutchinson, Anne, DO       ??? methylPREDNISolone (PF) (SOLU-MEDROL) injection 30 mg  30 mg IntraVENous Q12H Ayesha MohairBrownell, Cynthia A, NP   30 mg at 08/07/18 0817        DATA    CBC w/Diff   Lab Results   Component Value Date/Time    WBC 2.9 (L) 08/07/2018 02:59 AM    WBC 4.8 08/06/2018 11:14 AM    WBC 3.1 (L) 07/31/2018 06:27 AM    RBC 5.42 08/07/2018 02:59 AM    RBC 6.39 (H) 08/06/2018 11:14 AM    RBC 4.26 07/31/2018 06:27 AM    HGB 15.4 08/07/2018 02:59 AM    HGB 18.0 (H) 08/06/2018 11:14 AM     HGB 12.0 (L) 07/31/2018 06:27 AM    HCT 44.3 08/07/2018 02:59 AM    HCT 51.0 (H) 08/06/2018 11:14 AM    HCT 36.1 (L) 07/31/2018 06:27 AM    MCV 81.7 08/07/2018 02:59 AM    MCV 79.8 (L) 08/06/2018 11:14 AM    MCV 84.7 07/31/2018 06:27 AM    MCH 28.4 08/07/2018 02:59 AM    MCH 28.2 08/06/2018 11:14 AM    MCH 28.2 07/31/2018 06:27 AM    MCHC 34.8 08/07/2018 02:59 AM    MCHC 35.3 08/06/2018 11:14 AM    MCHC 33.2 07/31/2018 06:27 AM    PLT 141 08/07/2018 02:59 AM    PLT 152 08/06/2018 11:14 AM    PLT 115 (L) 07/31/2018 06:27 AM     Lab Results   Component Value Date/Time    BANDS 1.9 07/31/2018 06:27 AM    GRANS 54.5 08/07/2018 02:59 AM    GRANS 63.9 08/06/2018 11:14 AM    GRANS 48.6 07/31/2018 06:27 AM    MONOS 13.1 (H) 08/07/2018 02:59 AM    MONOS 18.1 (H) 08/06/2018 11:14 AM    MONOS 14.6 (H) 07/31/2018 06:27 AM    EOS 0.0 08/07/2018 02:59 AM    EOS 0.4 08/06/2018 11:14 AM    EOS 5.8 (H) 07/31/2018 06:27 AM    BASOS 0.3 08/07/2018 02:59 AM    BASOS 0.6 08/06/2018 11:14 AM    BASOS 0.0 07/31/2018 06:27 AM        Hepatic Function   Recent Labs     08/07/18  0259 08/06/18  1114   ALT 72 82*   TBILI 0.9 1.0   AP 107 129*   ALB 3.4 4.3   TP 8.6* 10.2*        Pancreatic Enzymes   Recent Labs     08/06/18  1114   LPSE 178        Basic Metabolic Profile   Recent Labs     08/07/18  0259 08/06/18  1229 08/06/18  1114   NA 134*  --  132*   K 3.7  --  3.1*   CL 107  --  104   CO2 18* 11.0* 13*   BUN 48*  --  75*   GLU 118*  --  121*   CREA 1.7*  --  2.1*   CA 8.3*  --  9.0        POC Glucose  No components found for: [facility]    Coagulation   No results for input(s): PTP, INR, APTT, INREXT in the last 72 hours.       Stool FOB   Lab Results   Component Value Date/Time    Occult blood, stool NEGATIVE 07/28/2018 11:40 AM       Radiology Results  Ct Abd Pelv Wo Cont    Result Date: 08/06/2018  CT Abdomen and Pelvis without Indication: Abdominal pain. Nausea, vomiting, diarrhea. Comparison: 07/22/2018. TECHNIQUE: CT of the abdomen  and pelvis WITHOUT intravenous contrast. Coronal and sagittal reformations were obtained. Oral contrast was given. All CT exams at this facility use one or more dose reduction techniques including automatic exposure control, mA/kV adjustment per patient's size, or iterative reconstruction technique. DICOM format imaged data is available to non-affiliated external healthcare facilities or entities on a secure, media free, reciprocal searchable basis with patient authorization for 12 months following the date of the study. DISCUSSION: ABSENCE OF INTRAVENOUS CONTRAST DECREASES SENSITIVITY FOR DETECTION OF FOCAL LESIONS AND VASCULAR PATHOLOGY. LOWER THORAX: Normal. HEPATOBILIARY: No focal hepatic lesions.  No biliary ductal dilatation. SPLEEN: No splenomegaly. PANCREAS: No focal masses or ductal dilatation. ADRENALS: No adrenal nodules. KIDNEYS/URETERS: No hydronephrosis, stones, or solid mass lesions. PELVIC ORGANS/BLADDER: Unremarkable. PERITONEUM / RETROPERITONEUM: No free air or fluid. LYMPH NODES: No lymphadenopathy. VESSELS: Unremarkable. GI TRACT: Multiple nondistended fluid-filled loops of small bowel with questionable mild wall thickening. Fluid-filled colon with no significant wall thickening. No obstruction. Normal appendix. BONES AND SOFT TISSUES: No acute abnormality.     IMPRESSION: Numerous fluid-filled loops of small and large bowel with borderline diffuse small bowel wall thickening, concerning for infectious or inflammatory enterocolitis. Findings have slightly progressed since 07/22/2018.           Cheral Almas NP-C  [facility]  Cell # [phone]  Office # [phone]  [address] [place]  [place], [place]

## 2018-08-07 NOTE — Progress Notes (Signed)
Chaplain visit no family present.

## 2018-08-07 NOTE — Progress Notes (Signed)
Hospitalist Progress Note               NAME:  Travis [name]   DOB:   March 22, 1990   MRN:   [id number]     PCP:  Janell Quiet, MD  Date/Time:  08/07/2018 11:30 AM      Assessment/Plan:   1. Enterocolitis appreciat gi assistance  Work up for ibd ongoing  2. Dehydration/abnormal electrolytes working   3. htn off meds   4. Asthma noton contrller   5. Hx of bells paulsy   6. Recent abn lft resolved  7. Hx of multiple abscesses  8. Urine pos for opiates he denies   9. Called mom        Subjective:   Still uncomfortable but better      ROS:   ROS  Slept ok   No headache    Objective:   Physical Exam:     Visit Vitals  BP 128/78 (BP Patient Position: Supine)   Pulse 89   Temp 97.5 ??F (36.4 ??C)   Resp 18   Ht 5' 7"  (1.702 m)   Wt 70.9 kg (156 lb 4.9 oz)   SpO2 100%   BMI 24.48 kg/m??      O2 Device: Room air    Temp (24hrs), Avg:97.6 ??F (36.4 ??C), Min:97.3 ??F (36.3 ??C), Max:98.2 ??F (36.8 ??C)    06/12 0701 - 06/12 1900  In: 540 [I.V.:540]  Out: -    06/10 1901 - 06/12 0700  In: 2673.8 [P.O.:160; I.V.:2513.8]  Out: 850 [Urine:550]    Physical Exam  Constitutional:       Appearance: He is overweight. He is ill-appearing. He is not toxic-appearing or diaphoretic.   HENT:      Head: Normocephalic and atraumatic.      Mouth/Throat:      Mouth: Mucous membranes are dry.   Eyes:      General: No scleral icterus.  Neck:      Musculoskeletal: Neck supple.   Cardiovascular:      Rate and Rhythm: Normal rate and regular rhythm.   Pulmonary:      Effort: Pulmonary effort is normal. No respiratory distress.      Breath sounds: Normal breath sounds. No stridor. No wheezing, rhonchi or rales.   Chest:      Chest wall: No tenderness.   Abdominal:      General: Bowel sounds are normal. There is distension.      Tenderness: There is abdominal tenderness.   Musculoskeletal:         General: No swelling.   Neurological:      General: No focal deficit present.      Mental Status: He is alert and oriented to person, place, and time.   Psychiatric:          Mood and Affect: Mood normal.         Behavior: Behavior normal. Behavior is cooperative.         Thought Content: Thought content normal.         Judgment: Judgment normal.       Data Review:       24 Hour Results:  Recent Results (from the past 24 hour(s))   POC BLOOD GAS + LACTIC ACID    Collection Time: 08/06/18 12:29 PM   Result Value Ref Range    pH 7.315 (L) 7.350 - 7.450      PCO2 20.7 (LL) 35.0 - 45.0 mm Hg  PO2 102.0 (H) 75 - 100 mm Hg    BICARBONATE 10.6 (LL) 18.0 - 26.0 mmol/L    O2 SAT 98.0 90 - 100 %    CO2, TOTAL 11.0 (L) 24 - 29 mmol/L    Lactic Acid 0.75 0.40 - 2.00 mmol/L    BASE EXCESS -16 (L) -2 - 3 mmol/L    Patient temp. 97.4 F      Sample type Art      SITE L Radial      DEVICE Room Air      ALLENS TEST Pass     LACTIC ACID    Collection Time: 08/06/18  4:40 PM   Result Value Ref Range    Lactic Acid 1.3 0.4 - 2.0 mmol/L   SED RATE (ESR)    Collection Time: 08/06/18  4:40 PM   Result Value Ref Range    Sed rate (ESR) 24 (H) 0 - 15 mm/Hr   GLUCOSE, POC    Collection Time: 08/06/18  5:11 PM   Result Value Ref Range    Glucose (POC) 101 65 - 105 mg/dL   DRUG SCREEN, URINE    Collection Time: 08/07/18  2:45 AM   Result Value Ref Range    Amphetamine NEGATIVE NEGATIVE      Barbiturates NEGATIVE NEGATIVE      Benzodiazepines NEGATIVE NEGATIVE      Cocaine NEGATIVE NEGATIVE      Marijuana NEGATIVE NEGATIVE      Methadone NEGATIVE NEGATIVE      Opiates POSITIVE (A) NEGATIVE      Phencyclidine NEGATIVE NEGATIVE     CBC WITH AUTOMATED DIFF    Collection Time: 08/07/18  2:59 AM   Result Value Ref Range    WBC 2.9 (L) 4.0 - 11.0 1000/mm3    RBC 5.42 3.80 - 5.70 M/uL    HGB 15.4 12.4 - 17.2 gm/dl    HCT 44.3 37.0 - 50.0 %    MCV 81.7 80.0 - 98.0 fL    MCH 28.4 23.0 - 34.6 pg    MCHC 34.8 30.0 - 36.0 gm/dl    PLATELET 141 140 - 450 1000/mm3    MPV 13.8 (H) 6.0 - 10.0 fL    RDW-SD 38.6 35.1 - 43.9      NRBC 0 0 - 0      IMMATURE GRANULOCYTES 0.3 0.0 - 3.0 %    NEUTROPHILS 54.5 34 - 64 %     LYMPHOCYTES 31.8 28 - 48 %    MONOCYTES 13.1 (H) 1 - 13 %    EOSINOPHILS 0.0 0 - 5 %    BASOPHILS 0.3 0 - 3 %   METABOLIC PANEL, COMPREHENSIVE    Collection Time: 08/07/18  2:59 AM   Result Value Ref Range    Sodium 134 (L) 136 - 145 mEq/L    Potassium 3.7 3.5 - 5.1 mEq/L    Chloride 107 98 - 107 mEq/L    CO2 18 (L) 21 - 32 mEq/L    Glucose 118 (H) 74 - 106 mg/dl    BUN 48 (H) 7 - 25 mg/dl    Creatinine 1.7 (H) 0.6 - 1.3 mg/dl    GFR est AA >60      GFR est non-AA 51      Calcium 8.3 (L) 8.5 - 10.1 mg/dl    AST (SGOT) 61 (H) 15 - 37 U/L    ALT (SGPT) 72 12 - 78 U/L    Alk. phosphatase 107  45 - 117 U/L    Bilirubin, total 0.9 0.2 - 1.0 mg/dl    Protein, total 8.6 (H) 6.4 - 8.2 gm/dl    Albumin 3.4 3.4 - 5.0 gm/dl    Anion gap 8 5 - 15 mmol/L       Problem List:  Problem List as of 08/07/2018 Date Reviewed: 28-May-2014          Codes Class Noted - Resolved    Dehydration ICD-10-CM: E86.0  ICD-9-CM: 276.51  08/06/2018 - Present        Enterocolitis ICD-10-CM: K52.9  ICD-9-CM: 558.9  08/06/2018 - Present        Intractable nausea and vomiting ICD-10-CM: R11.2  ICD-9-CM: 536.2  08/06/2018 - Present        AKI (acute kidney injury) ([place]) ICD-10-CM: N17.9  ICD-9-CM: 584.9  07/22/2018 - Present               Medications reviewed  Current Facility-Administered Medications   Medication Dose Route Frequency   ??? albuterol (PROVENTIL VENTOLIN) nebulizer solution 2.5 mg  2.5 mg Nebulization Q4H PRN   ??? dicyclomine (BENTYL) capsule 10 mg  10 mg Oral QID   ??? acetaminophen (TYLENOL) tablet 650 mg  650 mg Oral Q4H PRN    Or   ??? acetaminophen (TYLENOL) solution 650 mg  650 mg Oral Q4H PRN    Or   ??? acetaminophen (TYLENOL) suppository 650 mg  650 mg Rectal Q4H PRN   ??? naloxone (NARCAN) injection 0.1 mg  0.1 mg IntraVENous PRN   ??? dextrose 5% - 0.45% NaCl with KCl 20 mEq/L infusion  75 mL/hr IntraVENous CONTINUOUS   ??? morphine injection 2 mg  2 mg IntraVENous Q4H PRN   ??? ondansetron (ZOFRAN) injection 4 mg  4 mg IntraVENous Q4H PRN    ??? methylPREDNISolone (PF) (SOLU-MEDROL) injection 30 mg  30 mg IntraVENous Q12H        No Known Allergies    Care Plan discussed with: Patient/Family and Nurse        Peter Minium, MD  August 07, 2018  11:30 AM

## 2018-08-07 NOTE — Progress Notes (Signed)
Problem: Falls - Risk of  Goal: *Absence of Falls  Description: Document Schmid Fall Risk and appropriate interventions in the flowsheet.  Outcome: Progressing Towards Goal  Note: Fall Risk Interventions:  Mobility Interventions: Bed/chair exit alarm         Medication Interventions: Assess postural VS orthostatic hypotension, Bed/chair exit alarm, Patient to call before getting OOB, Teach patient to arise slowly    Elimination Interventions: Bed/chair exit alarm, Call light in reach, Patient to call for help with toileting needs, Urinal in reach

## 2018-08-07 NOTE — Other (Signed)
Patient having complaints of abdominal pain x 2. No nausea or vomiting. Patient is not ambulating in the halls, ambulating in room and to the bathroom . Will continue to monitor.

## 2018-08-07 NOTE — Progress Notes (Signed)
Problem: Nausea/Vomiting (Adult)  Goal: *Absence of nausea/vomiting  Outcome: Progressing Towards Goal     Problem: Falls - Risk of  Goal: *Absence of Falls  Description: Document Schmid Fall Risk and appropriate interventions in the flowsheet.  Outcome: Progressing Towards Goal  Note: Fall Risk Interventions:  Mobility Interventions: Bed/chair exit alarm         Medication Interventions: Assess postural VS orthostatic hypotension, Bed/chair exit alarm, Patient to call before getting OOB, Teach patient to arise slowly    Elimination Interventions: Bed/chair exit alarm, Call light in reach, Patient to call for help with toileting needs, Urinal in reach

## 2018-08-07 NOTE — Progress Notes (Signed)
Hospitalist Progress Note               NAME:  Travis [name] IV   DOB:   March 22, 1990   MRN:   [id number]     PCP:  Janell Quiet, MD  Date/Time:  08/07/2018 11:30 AM      Assessment/Plan:   1. Enterocolitis appreciat gi assistance  Work up for ibd ongoing  2. Dehydration/abnormal electrolytes working   3. htn off meds   4. Asthma noton contrller   5. Hx of bells paulsy   6. Recent abn lft resolved  7. Hx of multiple abscesses  8. Urine pos for opiates he denies   9. Called mom        Subjective:   Still uncomfortable but better      ROS:   ROS  Slept ok   No headache    Objective:   Physical Exam:     Visit Vitals  BP 128/78 (BP Patient Position: Supine)   Pulse 89   Temp 97.5 ??F (36.4 ??C)   Resp 18   Ht 5' 7"  (1.702 m)   Wt 70.9 kg (156 lb 4.9 oz)   SpO2 100%   BMI 24.48 kg/m??      O2 Device: Room air    Temp (24hrs), Avg:97.6 ??F (36.4 ??C), Min:97.3 ??F (36.3 ??C), Max:98.2 ??F (36.8 ??C)    06/12 0701 - 06/12 1900  In: 540 [I.V.:540]  Out: -    06/10 1901 - 06/12 0700  In: 2673.8 [P.O.:160; I.V.:2513.8]  Out: 850 [Urine:550]    Physical Exam  Constitutional:       Appearance: He is overweight. He is ill-appearing. He is not toxic-appearing or diaphoretic.   HENT:      Head: Normocephalic and atraumatic.      Mouth/Throat:      Mouth: Mucous membranes are dry.   Eyes:      General: No scleral icterus.  Neck:      Musculoskeletal: Neck supple.   Cardiovascular:      Rate and Rhythm: Normal rate and regular rhythm.   Pulmonary:      Effort: Pulmonary effort is normal. No respiratory distress.      Breath sounds: Normal breath sounds. No stridor. No wheezing, rhonchi or rales.   Chest:      Chest wall: No tenderness.   Abdominal:      General: Bowel sounds are normal. There is distension.      Tenderness: There is abdominal tenderness.   Musculoskeletal:         General: No swelling.   Neurological:      General: No focal deficit present.      Mental Status: He is alert and oriented to person, place, and time.   Psychiatric:          Mood and Affect: Mood normal.         Behavior: Behavior normal. Behavior is cooperative.         Thought Content: Thought content normal.         Judgment: Judgment normal.       Data Review:       24 Hour Results:  Recent Results (from the past 24 hour(s))   POC BLOOD GAS + LACTIC ACID    Collection Time: 08/06/18 12:29 PM   Result Value Ref Range    pH 7.315 (L) 7.350 - 7.450      PCO2 20.7 (LL) 35.0 - 45.0 mm Hg  PO2 102.0 (H) 75 - 100 mm Hg    BICARBONATE 10.6 (LL) 18.0 - 26.0 mmol/L    O2 SAT 98.0 90 - 100 %    CO2, TOTAL 11.0 (L) 24 - 29 mmol/L    Lactic Acid 0.75 0.40 - 2.00 mmol/L    BASE EXCESS -16 (L) -2 - 3 mmol/L    Patient temp. 97.4 F      Sample type Art      SITE L Radial      DEVICE Room Air      ALLENS TEST Pass     LACTIC ACID    Collection Time: 08/06/18  4:40 PM   Result Value Ref Range    Lactic Acid 1.3 0.4 - 2.0 mmol/L   SED RATE (ESR)    Collection Time: 08/06/18  4:40 PM   Result Value Ref Range    Sed rate (ESR) 24 (H) 0 - 15 mm/Hr   GLUCOSE, POC    Collection Time: 08/06/18  5:11 PM   Result Value Ref Range    Glucose (POC) 101 65 - 105 mg/dL   DRUG SCREEN, URINE    Collection Time: 08/07/18  2:45 AM   Result Value Ref Range    Amphetamine NEGATIVE NEGATIVE      Barbiturates NEGATIVE NEGATIVE      Benzodiazepines NEGATIVE NEGATIVE      Cocaine NEGATIVE NEGATIVE      Marijuana NEGATIVE NEGATIVE      Methadone NEGATIVE NEGATIVE      Opiates POSITIVE (A) NEGATIVE      Phencyclidine NEGATIVE NEGATIVE     CBC WITH AUTOMATED DIFF    Collection Time: 08/07/18  2:59 AM   Result Value Ref Range    WBC 2.9 (L) 4.0 - 11.0 1000/mm3    RBC 5.42 3.80 - 5.70 M/uL    HGB 15.4 12.4 - 17.2 gm/dl    HCT 44.3 37.0 - 50.0 %    MCV 81.7 80.0 - 98.0 fL    MCH 28.4 23.0 - 34.6 pg    MCHC 34.8 30.0 - 36.0 gm/dl    PLATELET 141 140 - 450 1000/mm3    MPV 13.8 (H) 6.0 - 10.0 fL    RDW-SD 38.6 35.1 - 43.9      NRBC 0 0 - 0      IMMATURE GRANULOCYTES 0.3 0.0 - 3.0 %    NEUTROPHILS 54.5 34 - 64 %    LYMPHOCYTES  31.8 28 - 48 %    MONOCYTES 13.1 (H) 1 - 13 %    EOSINOPHILS 0.0 0 - 5 %    BASOPHILS 0.3 0 - 3 %   METABOLIC PANEL, COMPREHENSIVE    Collection Time: 08/07/18  2:59 AM   Result Value Ref Range    Sodium 134 (L) 136 - 145 mEq/L    Potassium 3.7 3.5 - 5.1 mEq/L    Chloride 107 98 - 107 mEq/L    CO2 18 (L) 21 - 32 mEq/L    Glucose 118 (H) 74 - 106 mg/dl    BUN 48 (H) 7 - 25 mg/dl    Creatinine 1.7 (H) 0.6 - 1.3 mg/dl    GFR est AA >60      GFR est non-AA 51      Calcium 8.3 (L) 8.5 - 10.1 mg/dl    AST (SGOT) 61 (H) 15 - 37 U/L    ALT (SGPT) 72 12 - 78 U/L    Alk. phosphatase 107  45 - 117 U/L    Bilirubin, total 0.9 0.2 - 1.0 mg/dl    Protein, total 8.6 (H) 6.4 - 8.2 gm/dl    Albumin 3.4 3.4 - 5.0 gm/dl    Anion gap 8 5 - 15 mmol/L       Problem List:  Problem List as of 08/07/2018 Date Reviewed: 05/05/14          Codes Class Noted - Resolved    Dehydration ICD-10-CM: E86.0  ICD-9-CM: 276.51  08/06/2018 - Present        Enterocolitis ICD-10-CM: K52.9  ICD-9-CM: 558.9  08/06/2018 - Present        Intractable nausea and vomiting ICD-10-CM: R11.2  ICD-9-CM: 536.2  08/06/2018 - Present        AKI (acute kidney injury) ([place]) ICD-10-CM: N17.9  ICD-9-CM: 584.9  07/22/2018 - Present               Medications reviewed  Current Facility-Administered Medications   Medication Dose Route Frequency   ??? albuterol (PROVENTIL VENTOLIN) nebulizer solution 2.5 mg  2.5 mg Nebulization Q4H PRN   ??? dicyclomine (BENTYL) capsule 10 mg  10 mg Oral QID   ??? acetaminophen (TYLENOL) tablet 650 mg  650 mg Oral Q4H PRN    Or   ??? acetaminophen (TYLENOL) solution 650 mg  650 mg Oral Q4H PRN    Or   ??? acetaminophen (TYLENOL) suppository 650 mg  650 mg Rectal Q4H PRN   ??? naloxone (NARCAN) injection 0.1 mg  0.1 mg IntraVENous PRN   ??? dextrose 5% - 0.45% NaCl with KCl 20 mEq/L infusion  75 mL/hr IntraVENous CONTINUOUS   ??? morphine injection 2 mg  2 mg IntraVENous Q4H PRN   ??? ondansetron (ZOFRAN) injection 4 mg  4 mg IntraVENous Q4H PRN   ??? methylPREDNISolone  (PF) (SOLU-MEDROL) injection 30 mg  30 mg IntraVENous Q12H        No Known Allergies    Care Plan discussed with: Patient/Family and Nurse        Peter Minium, MD  August 07, 2018  11:30 AM

## 2018-08-07 NOTE — Progress Notes (Signed)
Progress Notes by Franky Machoicketts, Kenedie Dirocco A, MD at 08/07/18 [phone]                Author: Franky Machoicketts, Breyon Blass A, MD  Service: Gastroenterology  Author Type: Physician       Filed: 08/07/18 0953  Date of Service: 08/07/18 0826  Status: Addendum          Editor: Franky Machoicketts, Tully Burgo A, MD (Physician)          Related Notes: Original Note by Shelba FlakeBrownell, Cynthia A, NP (Nurse Practitioner) filed at 08/07/18  [phone]                       A member of [facility] [place], [facility]                   GI Progress Note      Patient:  Travis [name]   Date of Birth:  01/21/1991   Age: 28 y.o.   Sex: male   PCP: Paulla ForeKapoor, [name], MD   MRN: [id number]                      Admit Date: 08/06/2018        Assessment:     1.  N/v, abd pain, diarrhea (nonbloody). CT scan w/ numerous fluid filled loops of small and lg bowel w/ borderline diffuse small bowel  wall thickening-concerning for infectious or inflammatory enterocolitis-progressed from prior CT -r/o IBD   ??  Admit recently for similar symptoms, CT w/ enteritis. Stool cultures were neg.    ??  CRP nml, LA nml, Sed rate 24   ??  COVID neg 5/27   2.  Elevated LFTS--Liver unremarkable on CT imaging. Viral panel neg    3.  AKI - dehyrdation from diarrhea?   4.  HTN   5.  Asthma   6.  Bell's palsy        Plan:     ??  f/u fecal cal protectin, Giardia, HIV   ??  Cont [name] steroids   ??  Trial clears, advance to bland/low residue diet as tol    ??  Acid suppression/antiemetics    ??  CS when pt can tol prep-need to r/o IBD         I have seen and examined the patient independently. The case and chart were reviewed, including all pertinent lab and imaging results. I agree with Ms. Brownell's assessment and plan.  Minimally  improved since starting steroids, certainly no worse.  Will continue to monitor clinically over the next couple of days.  Await remainder of stool studies and HIV.  Agree with colonoscopy early next week, timing TBD pending clinical course.      Dalia HeadingPaul Mishayla Sliwinski, M.D.     [facility]    [place] Office: [phone]         Subjective:     Nausea overnight, denies currently   Abd pain-some improvement from yesterday   One BM overnight. No blood in stool     Objective:     Physical Exam:   Visit Vitals      BP  128/78 (BP Patient Position: Supine)     Pulse  89     Temp  97.5 ??F (36.4 ??C)     Resp  18     Ht  5\' 7"  (1.702 m)     Wt  70.9 kg (156 lb 4.9 oz)  SpO2  100%        BMI  24.48 kg/m??         Temp (24hrs), Avg:97.6 ??F (36.4 ??C), Min:97.3 ??F (36.3 ??C), Max:98.2 ??F (36.8 ??C)         Intake/Output Summary (Last 24 hours) at 08/07/2018 4540   Last data filed at 08/07/2018 9811     Gross per 24 hour        Intake  3213.75 ml        Output  850 ml        Net  2363.75 ml        GENERAL: alert, no distress   LUNG: normal respiratory effort   HEART: regular rate and rhythm   ABDOMEN: soft, ND, NABS, mild diffuse tenderness        Current Facility-Administered Medications             Medication  Dose  Route  Frequency  Provider  Last Rate  Last Dose              ?  albuterol (PROVENTIL VENTOLIN) nebulizer solution 2.5 mg   2.5 mg  Nebulization  Q4H PRN  Richarda Overlie, DO           ?  dicyclomine (BENTYL) capsule 10 mg   10 mg  Oral  QID  Richarda Overlie, DO     10 mg at 08/07/18 9147     ?  acetaminophen (TYLENOL) tablet 650 mg   650 mg  Oral  Q4H PRN  Richarda Overlie, DO                Or              ?  acetaminophen (TYLENOL) solution 650 mg   650 mg  Oral  Q4H PRN  Richarda Overlie, DO                Or              ?  acetaminophen (TYLENOL) suppository 650 mg   650 mg  Rectal  Q4H PRN  Hutchinson, Anne, DO                    ?  naloxone (NARCAN) injection 0.1 mg   0.1 mg  IntraVENous  PRN  Hutchinson, Anne, DO           ?  dextrose 5% - 0.45% NaCl with KCl 20 mEq/L infusion   75 mL/hr  IntraVENous  CONTINUOUS  Hutchinson, Anne, DO  75 mL/hr at 08/07/18 0714  75 mL/hr at 08/07/18 0714     ?  morphine injection 2 mg   2 mg  IntraVENous  Q4H PRN  Richarda Overlie, DO     2 mg at 08/06/18 1719     ?  ondansetron (ZOFRAN) injection 4 mg   4 mg  IntraVENous  Q4H PRN  Hutchinson, Anne, DO                    ?  methylPREDNISolone (PF) (SOLU-MEDROL) injection 30 mg   30 mg  IntraVENous  Q12H  Ayesha Mohair A, NP     30 mg at 08/07/18 8295            DATA        CBC w/Diff     Lab Results      Component  Value  Date/Time  WBC  2.9 (L)  08/07/2018 02:59 AM        WBC  4.8  08/06/2018 11:14 AM        WBC  3.1 (L)  07/31/2018 06:27 AM        RBC  5.42  08/07/2018 02:59 AM        RBC  6.39 (H)  08/06/2018 11:14 AM        RBC  4.26  07/31/2018 06:27 AM        HGB  15.4  08/07/2018 02:59 AM        HGB  18.0 (H)  08/06/2018 11:14 AM        HGB  12.0 (L)  07/31/2018 06:27 AM        HCT  44.3  08/07/2018 02:59 AM        HCT  51.0 (H)  08/06/2018 11:14 AM        HCT  36.1 (L)  07/31/2018 06:27 AM        MCV  81.7  08/07/2018 02:59 AM        MCV  79.8 (L)  08/06/2018 11:14 AM        MCV  84.7  07/31/2018 06:27 AM        MCH  28.4  08/07/2018 02:59 AM        MCH  28.2  08/06/2018 11:14 AM        MCH  28.2  07/31/2018 06:27 AM        MCHC  34.8  08/07/2018 02:59 AM        MCHC  35.3  08/06/2018 11:14 AM        MCHC  33.2  07/31/2018 06:27 AM        PLT  141  08/07/2018 02:59 AM        PLT  152  08/06/2018 11:14 AM        PLT  115 (L)  07/31/2018 06:27 AM           Lab Results      Component  Value  Date/Time        BANDS  1.9  07/31/2018 06:27 AM        GRANS  54.5  08/07/2018 02:59 AM        GRANS  63.9  08/06/2018 11:14 AM        GRANS  48.6  07/31/2018 06:27 AM        MONOS  13.1 (H)  08/07/2018 02:59 AM        MONOS  18.1 (H)  08/06/2018 11:14 AM        MONOS  14.6 (H)  07/31/2018 06:27 AM        EOS  0.0  08/07/2018 02:59 AM        EOS  0.4  08/06/2018 11:14 AM        EOS  5.8 (H)  07/31/2018 06:27 AM        BASOS  0.3  08/07/2018 02:59 AM        BASOS  0.6  08/06/2018 11:14 AM        BASOS  0.0  07/31/2018 06:27 AM                  Hepatic Function     Recent Labs          08/07/18   0259  08/06/18   1114      ALT  72  82*  TBILI  0.9  1.0      AP  107  129*      ALB  3.4  4.3      TP  8.6*  10.2*                  Pancreatic Enzymes     Recent Labs         08/06/18   1114      LPSE  178                  Basic Metabolic Profile     Recent Labs         08/07/18   0259  08/06/18   1229  08/06/18   1114      NA  134*   --   132*      K  3.7   --   3.1*      CL  107   --   104      CO2  18*  11.0*  13*      BUN  48*   --   75*      GLU  118*   --   121*      CREA  1.7*   --   2.1*      CA  8.3*   --   9.0                POC Glucose   No components found for: [facility]        Coagulation       No results for input(s): PTP, INR, APTT, INREXT in the last 72 hours.           Stool FOB      Lab Results         Component  Value  Date/Time            Occult blood, stool  NEGATIVE  07/28/2018 11:40 AM           Radiology Results   Ct Abd Pelv Wo Cont      Result Date: 08/06/2018   CT Abdomen and Pelvis without Indication: Abdominal pain. Nausea, vomiting, diarrhea. Comparison: 07/22/2018. TECHNIQUE: CT of the abdomen and pelvis WITHOUT intravenous contrast. Coronal and sagittal reformations were obtained. Oral contrast was given.  All CT exams at this facility use one or more dose reduction techniques including automatic exposure control, mA/kV adjustment per patient's size, or iterative reconstruction technique. DICOM format imaged data is available to non-affiliated external  healthcare facilities or entities on a secure, media free, reciprocal searchable basis with patient authorization for 12 months following the date of the study. DISCUSSION: ABSENCE OF INTRAVENOUS CONTRAST DECREASES SENSITIVITY FOR DETECTION OF FOCAL LESIONS  AND VASCULAR PATHOLOGY. LOWER THORAX: Normal. HEPATOBILIARY: No focal hepatic lesions.  No biliary ductal dilatation. SPLEEN: No splenomegaly. PANCREAS: No focal masses or ductal dilatation. ADRENALS: No adrenal nodules. KIDNEYS/URETERS: No hydronephrosis,  stones, or  solid mass lesions. PELVIC ORGANS/BLADDER: Unremarkable. PERITONEUM / RETROPERITONEUM: No free air or fluid. LYMPH NODES: No lymphadenopathy. VESSELS: Unremarkable. GI TRACT: Multiple nondistended fluid-filled loops of small bowel with questionable  mild wall thickening. Fluid-filled colon with no significant wall thickening. No obstruction. Normal appendix. BONES AND SOFT TISSUES: No acute abnormality.       IMPRESSION: Numerous fluid-filled loops of small and large bowel with borderline diffuse small bowel wall thickening, concerning for infectious or inflammatory enterocolitis. Findings have slightly progressed since  07/22/2018.                Cheral Almas NP-C   [facility]   Cell # [phone]   Office # [phone]   [address], [place]   [place], [place]

## 2018-08-07 NOTE — Progress Notes (Signed)
Problem: Nausea/Vomiting (Adult)  Goal: *Absence of nausea/vomiting  Outcome: Progressing Towards Goal     Problem: Falls - Risk of  Goal: *Absence of Falls  Description: Document Bridgette Habermann Fall Risk and appropriate interventions in the flowsheet.  Outcome: Progressing Towards Goal  Note: Fall Risk Interventions:  Mobility Interventions: Bed/chair exit alarm         Medication Interventions: Assess postural VS orthostatic hypotension, Bed/chair exit alarm, Patient to call before getting OOB, Teach patient to arise slowly    Elimination Interventions: Bed/chair exit alarm, Call light in reach, Patient to call for help with toileting needs, Urinal in reach

## 2018-08-07 NOTE — Progress Notes (Signed)
Problem: Falls - Risk of  Goal: *Absence of Falls  Description: Document Travis [name] Fall Risk and appropriate interventions in the flowsheet.  Outcome: Progressing Towards Goal  Note: Fall Risk Interventions:  Mobility Interventions: Bed/chair exit alarm         Medication Interventions: Assess postural VS orthostatic hypotension, Bed/chair exit alarm, Patient to call before getting OOB, Teach patient to arise slowly    Elimination Interventions: Bed/chair exit alarm, Call light in reach, Patient to call for help with toileting needs, Urinal in reach

## 2018-08-07 NOTE — Progress Notes (Signed)
Stool and Urine sample collected and sent to the Lab.

## 2018-08-07 NOTE — Progress Notes (Signed)
Chaplain visit no family present.

## 2018-08-08 LAB — HIV 1/2 AB SCREEN W RFLX CONFIRM
HIV 1/2 AG/AB 4TH GENERATION: REACTIVE — AB
HIV 1/2 AG/AB 4TH GENERATION: REACTIVE — AB
HIV 2 ANTIBODY, 86009057: NEGATIVE
HIV 2 Antibody: NEGATIVE
HIV-1 Ab: POSITIVE — AB
HIV-1 Ab: POSITIVE — AB

## 2018-08-08 LAB — CRYPTOSPOR/GIARDIA AG
CRYPTOSPORIDIUM, CRYPTOSPORIDIUM: POSITIVE — AB
Cryptosporidium: POSITIVE — AB
Giardia Ag: POSITIVE — AB
Giardia Ag: POSITIVE — AB

## 2018-08-08 LAB — HIV 1/2 ANTIGEN/ANTIBODY, FOURTH GENERATION W/RFL
HIV P24 ANTIGEN, RAPID, HIVP24AG: REACTIVE — AB
HIV RAPID: NONREACTIVE

## 2018-08-08 LAB — HIV-1/2 ANTIBODY, DIFFERENTIATION
HIV 2 ANTIBODY, 86009057: NEGATIVE
HIV 2 Antibody: NEGATIVE
HIV-1 Ab: POSITIVE — AB
HIV-1 Ab: POSITIVE — AB

## 2018-08-08 LAB — HIV 1/2 AG/AB, 4TH GENERATION,W RFLX CONFIRM
HIV P24 ANTIGEN, RAPID: REACTIVE — AB
HIV Rapid: NONREACTIVE

## 2018-08-08 MED ORDER — NITAZOXANIDE 500 MG TAB
500 mg | Freq: Two times a day (BID) | ORAL | Status: DC
Start: 2018-08-08 — End: 2018-08-08

## 2018-08-08 MED ORDER — NITAZOXANIDE 500 MG TAB
500 mg | Freq: Two times a day (BID) | ORAL | Status: AC
Start: 2018-08-08 — End: 2018-08-11
  Administered 2018-08-08 – 2018-08-11 (×6): via ORAL

## 2018-08-08 MED FILL — ALINIA 500 MG TABLET: 500 mg | ORAL | Qty: 2

## 2018-08-08 MED FILL — ONDANSETRON (PF) 4 MG/2 ML INJECTION: 4 mg/2 mL | INTRAMUSCULAR | Qty: 2

## 2018-08-08 MED FILL — SOLU-MEDROL (PF) 40 MG/ML SOLUTION FOR INJECTION: 40 mg/mL | INTRAMUSCULAR | Qty: 1

## 2018-08-08 MED FILL — DICYCLOMINE 10 MG CAP: 10 mg | ORAL | Qty: 1

## 2018-08-08 MED FILL — ACETAMINOPHEN 325 MG TABLET: 325 mg | ORAL | Qty: 2

## 2018-08-08 NOTE — Consults (Addendum)
INFECTIOUS DISEASE CONSULT NOTE       Requested by: Dr  [name]    Reason for consult: chronic diarrhea    Date of admission: 08/06/2018    Date of consult: August 08, 2018      ABX:     Current abx Prior abx    nitazoxinide 6/13      ASSESSMENT:       cryptosporidia/ Giardia concomitant infection   source unknown    HIV- new diagnosis    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    neutropenia with adequate anc             nkda         RECOMMENDATIONS:     1- I asked micro to repeat the testing on the stool that resulted in BOTH + for giardia AND crypto- possible- with exposure to contaminated source water,  but suspicious for error  HIV testing in place and still pending-   Stool for O&P may pick up giardia as well  If both still (+), will send out for multiplex pcr on stool  Rx  With nitazoxanide 1000 mg po bid x 3 d, then, if improved,may  decrease  tp 500 mg po bid to complete  x 14 - 28 d  Monitor stool count  HIV AN +  CD4, VL IP  Will obtain genomic testing - for appropriate HAART  determination   HIV baseline labs- after review of [facility] w/u- to avoid duplication   update vaccines when CD4 adequate  Pt will need to be set up with long term provider for management as outpt for this                     MICROBIOLOGY:       5/27  Neg c diff  6/2    Stool culture neg  6/12  + for BOTH giardia and cryptosporidia-- lab repeated test with same results  LINES AND CATHETERS:   PIV    HPI:   28 yo XY with chronic diarrhea x "18 days" per pt has been [facility] and  worked up by IM and  GI on several occassions and was re-admitted on 6/11 with RLQ abdominal pain, N/V and nonbloody diarrhea and 25lbs weight loss x 1 month. WBC count was borderline low. He has enteritis on CT, ddx infectious vs IBD, however it has progressed over the past 2 weeks. GI consultant checked   stool for less common pathogens and   empirically with IV steroids for IBS to assess clinical response   C diff eval and stool culture neg in past.  Stool for giardia and cryptosporidia AG from 6/12 were positive. HIV test was ordered ad returns with HIV An +, pcr IP.    Pt is MSM.  Has syphilis in march of this year and had std wy with penicillin shot q w x 3 at [facility].  Extensive testing took place , pt uncertain which tests were actually done.      Past medical history:     Past Medical History:   Diagnosis Date   ??? Asthma    ??? Hypertension    ??? Neurological disorder     Bell's palsy       History reviewed. No pertinent surgical history.     Social History:     Social History     Socioeconomic History   ??? Marital status: SINGLE  Spouse name: Not on file   ??? Number of children: Not on file   ??? Years of education: Not on file   ??? Highest education level: Not on file   Occupational History   ??? Not on file   Social Needs   ??? Financial resource strain: Not on file   ??? Food insecurity     Worry: Not on file     Inability: Not on file   ??? Transportation needs     Medical: Not on file     Non-medical: Not on file   Tobacco Use   ??? Smoking status: Never Smoker   ??? Smokeless tobacco: Never Used   Substance and Sexual Activity   ??? Alcohol use: No   ??? Drug use: No   ??? Sexual activity: Yes     Partners: Female     Birth control/protection: Condom   Lifestyle   ??? Physical activity     Days per week: Not on file     Minutes per session: Not on file   ??? Stress: Not on file   Relationships   ??? Social Wellsite geologistconnections     Talks on phone: Not on file     Gets together: Not on file     Attends religious service: Not on file     Active member of club or organization: Not on file     Attends meetings of clubs or organizations: Not on file     Relationship status: Not on file   ??? Intimate partner violence     Fear of current or ex partner: Not on file     Emotionally abused: Not on file     Physically abused: Not on file     Forced sexual activity: Not on file    Other Topics Concern   ??? Not on file   Social History Narrative   ??? Not on file       Family History:   History reviewed. No pertinent family history.    Allergies:   No Known Allergies      Home Medications:     Medications Prior to Admission   Medication Sig   ??? omeprazole (PRILOSEC) 40 mg capsule Take 1 Cap by mouth Daily (before breakfast).   ??? albuterol (PROAIR HFA) 90 mcg/actuation inhaler Take 1 Puff by inhalation every four (4) hours as needed for Wheezing.       Current Medications:     Current Facility-Administered Medications   Medication Dose Route Frequency   ??? albuterol (PROVENTIL VENTOLIN) nebulizer solution 2.5 mg  2.5 mg Nebulization Q4H PRN   ??? dicyclomine (BENTYL) capsule 10 mg  10 mg Oral QID   ??? acetaminophen (TYLENOL) tablet 650 mg  650 mg Oral Q4H PRN    Or   ??? acetaminophen (TYLENOL) solution 650 mg  650 mg Oral Q4H PRN    Or   ??? acetaminophen (TYLENOL) suppository 650 mg  650 mg Rectal Q4H PRN   ??? naloxone (NARCAN) injection 0.1 mg  0.1 mg IntraVENous PRN   ??? dextrose 5% - 0.45% NaCl with KCl 20 mEq/L infusion  75 mL/hr IntraVENous CONTINUOUS   ??? morphine injection 2 mg  2 mg IntraVENous Q4H PRN   ??? ondansetron (ZOFRAN) injection 4 mg  4 mg IntraVENous Q4H PRN       Review of Systems:   12 points ROS done. Pertinent positives and negatives are as follows, ROS otherwise negative.    Constitutional: Positive for  malaise/fatigue. Negative for chills and fever.   HENT: Negative for congestion, ear pain, sinus pain and sore throat.    Eyes: Negative for blurred vision and photophobia.   Respiratory: Negative for cough, shortness of breath and wheezing.    Cardiovascular: Negative for chest pain, palpitations and leg swelling.   Gastrointestinal: Positive for abdominal pain, diarrhea, nausea and vomiting. Negative for constipation and heartburn.   Genitourinary: Negative for dysuria and flank pain.   Musculoskeletal: Negative for myalgias.   Skin: Negative for itching and rash.    Neurological: Positive for weakness. Negative for dizziness and headaches.   Endo/Heme/Allergies: Negative for polydipsia.   Psychiatric/Behavioral: Negative for depression and suicidal ideas. The patient is not nervous/anxious and does not have insomnia.        Physical Exam:  Vitals  Temp (24hrs), Avg:97.8 ??F (36.6 ??C), Min:97.4 ??F (36.3 ??C), Max:98.3 ??F (36.8 ??C)    Visit Vitals  BP (!) 155/101 (BP 1 Location: Right arm, BP Patient Position: Supine)   Pulse 79   Temp 97.8 ??F (36.6 ??C)   Resp 18   Ht 5\' 7"  (1.702 m)   Wt 70.9 kg (156 lb 4.9 oz)   SpO2 100%   BMI 24.48 kg/m??       General: slender, 28 y.o. year-old, male, in no acute distress  HEENT: Normocephalic, anicteric sclerae, Pupils equal, round reactive to light, no oropharyngeal lesions. No sinus tenderness. Dentition in mediocre repair  Neck: Supple, no lymphadenopathy, masses or thyromegaly  Chest: Symmetrical expansion  Lungs: Clear to auscultation bilaterally, no dullness  Heart: Regular rhythm, no murmur, no rub or gallop, No JVD  Abdomen: Soft, some TTP, no rebound.slightly distended, no organomegaly, BS+  Musculoskeletal: Normal strength/tone. No edema. No clubbing or cyanosis  CNS: AAOx3. Cranial nerves II-XII intact. Grossly normal.No NR  SKIN: No skin lesion or rash. Dry, warm, intact          Labs: Results:   Chemistry Recent Labs     08/07/18  0259 08/06/18  1229 08/06/18  1114   GLU 118*  --  121*   NA 134*  --  132*   K 3.7  --  3.1*   CL 107  --  104   CO2 18* 11.0* 13*   BUN 48*  --  75*   CREA 1.7*  --  2.1*   CA 8.3*  --  9.0   AGAP 8  --  15   AP 107  --  129*   TP 8.6*  --  10.2*   ALB 3.4  --  4.3      CBC w/Diff Recent Labs     08/07/18  0259 08/06/18  1114   WBC 2.9* 4.8   RBC 5.42 6.39*   HGB 15.4 18.0*   HCT 44.3 51.0*   PLT 141 152   GRANS 54.5 63.9   LYMPH 31.8 16.8*   EOS 0.0 0.4      Microbiology No results for input(s): CULT in the last 72 hours.       Imaging-    Results from Hospital Encounter encounter on 07/21/18    XR ABD (AP AND ERECT OR DECUB)    Narrative Clinical history: Abdominal pain, ileus    EXAMINATION:  Supine and upright projections of the abdomen 07/30/2018    FINDINGS:  Air is noted in the ascending, transverse and portions of the descending colon  and rectosigmoid. No dilated loops of small or bowel. Multiple air-fluid levels  on the upright projection.      Impression IMPRESSION:  Ileus.         Results from [place] encounter on 08/06/18   CT ABD PELV WO CONT    Narrative CT Abdomen and Pelvis without    Indication: Abdominal pain. Nausea, vomiting, diarrhea.    Comparison: 07/22/2018.    TECHNIQUE:   CT of the abdomen and pelvis WITHOUT intravenous contrast. Coronal and sagittal  reformations were obtained. Oral contrast was given.    All CT exams at this facility use one or more dose reduction techniques  including automatic exposure control, mA/kV adjustment per patient's size, or  iterative reconstruction technique. DICOM format imaged data is available to  non-affiliated external healthcare facilities or entities on a secure, media  free, reciprocal searchable basis with patient authorization for 12 months  following the date of the study.    DISCUSSION:    ABSENCE OF INTRAVENOUS CONTRAST DECREASES SENSITIVITY FOR DETECTION OF FOCAL  LESIONS AND VASCULAR PATHOLOGY.    LOWER THORAX: Normal.    HEPATOBILIARY: No focal hepatic lesions.  No biliary ductal dilatation.  SPLEEN: No splenomegaly.  PANCREAS: No focal masses or ductal dilatation.    ADRENALS: No adrenal nodules.  KIDNEYS/URETERS: No hydronephrosis, stones, or solid mass lesions.  PELVIC ORGANS/BLADDER: Unremarkable.    PERITONEUM / RETROPERITONEUM: No free air or fluid.  LYMPH NODES: No lymphadenopathy.  VESSELS: Unremarkable.    GI TRACT: Multiple nondistended fluid-filled loops of small bowel with  questionable mild wall thickening. Fluid-filled colon with no significant wall  thickening. No obstruction. Normal appendix.     BONES AND SOFT TISSUES: No acute abnormality.      Impression IMPRESSION:    Numerous fluid-filled loops of small and large bowel with borderline diffuse  small bowel wall thickening, concerning for infectious or inflammatory  enterocolitis. Findings have slightly progressed since 07/22/2018.         ---------------------------------------------------------------------------------------------------------------  I have independently examined the patient and reviewed all lab studies and imgaing as well as review of nursing notes and physican notes       Jearld Adjutant, MD  08/08/2018    [facility]  [phone]

## 2018-08-08 NOTE — Other (Signed)
Bedside and Verbal shift change report given to Ascencion Dike, RN (oncoming nurse) by Sunnie Nielsen (offgoing nurse). Report included the following information SBAR and Kardex.

## 2018-08-08 NOTE — Progress Notes (Signed)
Problem: Nausea/Vomiting (Adult)  Goal: *Absence of nausea/vomiting  Outcome: Progressing Towards Goal     Problem: Pain  Goal: *Control of Pain  Outcome: Progressing Towards Goal

## 2018-08-08 NOTE — Other (Signed)
Bedside and verbal shift change report given to Carrissa Taitano (Cabin crew) by Gardiner Ramus (offgoing nurse). Reports included the following information SBAR, Kardex and MAR.

## 2018-08-08 NOTE — Progress Notes (Signed)
Hospitalist Progress Note               NAME:  Eriverto Byrnes IV   DOB:   1990-06-21   MRN:   [id number]     PCP:  Janell Quiet, MD  Date/Time:  08/08/2018 11:17 AM      Assessment/Plan:   1. Enterocolitis appreciat gi assistance  Work up for ibd ongoing and looks like its cryptosporidium and giardia are posiive is likely he may be hiv+ his lymphocytes are low/ I bet I went over his probablily of hiv treatmetn etc  Once w get labs back        Will hae to check with his regular Drr if she is comfortable treating it or if needs ID               referral advance diet  2. Dehydration/abnormal electrolytes working   3. htn off meds  Been up but is in pain so  4. Asthma noton contrller   5. Hx of bells paulsy   6. Recent abn lft resolved  7. Hx of multiple abscesses  8. Urine pos for opiates he denies        Subjective:   Still abdominal cramping denies well h20/ camping etc  ROS:   ROS  No fever  No chest pain   Objective:       Visit Vitals  BP 135/86 (BP 1 Location: Right arm, BP Patient Position: Supine)   Pulse 67   Temp 97.9 ??F (36.6 ??C)   Resp 18   Ht 5\' 7"  (1.702 m)   Wt 70.9 kg (156 lb 4.9 oz)   SpO2 100%   BMI 24.48 kg/m??      O2 Device: Room air    Temp (24hrs), Avg:97.8 ??F (36.6 ??C), Min:97.4 ??F (36.3 ??C), Max:98.3 ??F (36.8 ??C)    No intake/output data recorded.   06/11 1901 - 06/13 0700  In: 3388.8 [P.O.:575; I.V.:2813.8]  Out: 1550 [Urine:1250]    Physical Exam  Constitutional:       Appearance: He is underweight. He is not ill-appearing, toxic-appearing or diaphoretic.   HENT:      Head: Normocephalic and atraumatic.   Eyes:      General: No scleral icterus.  Neck:      Musculoskeletal: Neck supple.   Cardiovascular:      Rate and Rhythm: Normal rate and regular rhythm.      Heart sounds: No murmur.   Pulmonary:      Effort: Pulmonary effort is normal. No respiratory distress.      Breath sounds: No stridor. No wheezing, rhonchi or rales.   Chest:      Chest wall: No tenderness.   Abdominal:       Palpations: Abdomen is soft. There is no mass.      Tenderness: There is abdominal tenderness. There is no guarding.   Skin:     Coloration: Skin is not jaundiced.   Neurological:      Mental Status: He is alert and oriented to person, place, and time.   Psychiatric:         Mood and Affect: Mood normal.         Behavior: Behavior is cooperative.       Data Review:       24 Hour Results:  No results found for this or any previous visit (from the past 24 hour(s)).    Problem List:  Problem List as of 08/08/2018 Date  Reviewed: 05/03/2014          Codes Class Noted - Resolved    Dehydration ICD-10-CM: E86.0  ICD-9-CM: 276.51  08/06/2018 - Present        Enterocolitis ICD-10-CM: K52.9  ICD-9-CM: 558.9  08/06/2018 - Present        Intractable nausea and vomiting ICD-10-CM: R11.2  ICD-9-CM: 536.2  08/06/2018 - Present        AKI (acute kidney injury) (HCC) ICD-10-CM: N17.9  ICD-9-CM: 584.9  07/22/2018 - Present               Medications reviewed  Current Facility-Administered Medications   Medication Dose Route Frequency   ??? albuterol (PROVENTIL VENTOLIN) nebulizer solution 2.5 mg  2.5 mg Nebulization Q4H PRN   ??? dicyclomine (BENTYL) capsule 10 mg  10 mg Oral QID   ??? acetaminophen (TYLENOL) tablet 650 mg  650 mg Oral Q4H PRN    Or   ??? acetaminophen (TYLENOL) solution 650 mg  650 mg Oral Q4H PRN    Or   ??? acetaminophen (TYLENOL) suppository 650 mg  650 mg Rectal Q4H PRN   ??? naloxone (NARCAN) injection 0.1 mg  0.1 mg IntraVENous PRN   ??? dextrose 5% - 0.45% NaCl with KCl 20 mEq/L infusion  75 mL/hr IntraVENous CONTINUOUS   ??? morphine injection 2 mg  2 mg IntraVENous Q4H PRN   ??? ondansetron (ZOFRAN) injection 4 mg  4 mg IntraVENous Q4H PRN        No Known Allergies    Care Plan discussed with: Patient/Family, Nurse and Consultant gastro    Total time spent with patient: 40 minutes.    Omar PersonJulius Aldahir Litaker, MD  August 08, 2018  11:17 AM

## 2018-08-08 NOTE — Progress Notes (Signed)
A member of [facility]        GI Progress Note    Admit Date: 08/06/2018    Assessment:   1. Recurrent N/v, abd pain, diarrhea??(nonbloody). CT scan showing infectious or inflammatory enterocolitis-progressed from prior CT  ?? Admit recently for similar symptoms, CT w/ enteritis. Stool cultures/C.diff were neg.   ?? +cryptosporidium/giardia ag  ?? COVID neg 5/27  2. Elevated LFTS--Liver unremarkable on CT imaging. Viral panel neg??  3. AKI - dehyrdation from diarrhea?  4. HTN  5. Asthma  6. Bell's palsy      Plan:   ?? IV steroids d/c'd  ?? Await HIV and CD4 status  ?? Nitazoxanide 1000mg  bid - not on formulary per pharmacist  ?? ID consult     Case discussed with Dr. Sabra Heck and Dr. Glendora Score  Pt and his mother updated at bedside    Bea Graff, M.D.   [facility] [place] Office: [phone]     Subjective:     Still with diarrhea and some abd pain. No vomiting or bloody stools  No fever      Objective:   Physical Exam:  Visit Vitals  BP (!) 155/101 (BP 1 Location: Right arm, BP Patient Position: Supine)   Pulse 79   Temp 97.8 ??F (36.6 ??C)   Resp 18   Ht 5\' 7"  (1.702 m)   Wt 70.9 kg (156 lb 4.9 oz)   SpO2 100%   BMI 24.48 kg/m??         Gen: aaox3, NAD, gaunt  CV: RRR no m  Pulm: CTA bilat  Abd: soft, NT, ND +BS  Ext: no LE edema bilat    Current Facility-Administered Medications   Medication Dose Route Frequency   ??? albuterol (PROVENTIL VENTOLIN) nebulizer solution 2.5 mg  2.5 mg Nebulization Q4H PRN   ??? dicyclomine (BENTYL) capsule 10 mg  10 mg Oral QID   ??? acetaminophen (TYLENOL) tablet 650 mg  650 mg Oral Q4H PRN    Or   ??? acetaminophen (TYLENOL) solution 650 mg  650 mg Oral Q4H PRN    Or   ??? acetaminophen (TYLENOL) suppository 650 mg  650 mg Rectal Q4H PRN   ??? naloxone (NARCAN) injection 0.1 mg  0.1 mg IntraVENous PRN   ??? dextrose 5% - 0.45% NaCl with KCl 20 mEq/L infusion  75 mL/hr IntraVENous CONTINUOUS   ??? morphine injection 2 mg  2 mg IntraVENous Q4H PRN    ??? ondansetron (ZOFRAN) injection 4 mg  4 mg IntraVENous Q4H PRN       Labs    CBC w/Diff   Recent Labs     08/07/18  0259 08/06/18  1114   WBC 2.9* 4.8   HGB 15.4 18.0*   HCT 44.3 51.0*   MCV 81.7 79.8*   PLT 141 152        Hepatic Function   Recent Labs     08/07/18  0259 08/06/18  1114   ALT 72 82*   AP 107 129*   TBILI 0.9 1.0   ALB 3.4 4.3   TP 8.6* 10.2*        Pancreatic Enzymes   Recent Labs     08/06/18  1114   LPSE 178        Basic Metabolic Profile   Recent Labs     08/07/18  0259 08/06/18  1229 08/06/18  1114   NA 134*  --  132*  K 3.7  --  3.1*   CL 107  --  104   CO2 18* 11.0* 13*   BUN 48*  --  75*   GLU 118*  --  121*   CREA 1.7*  --  2.1*   CA 8.3*  --  9.0        Coagulation   No results for input(s): PTP, INR, APTT, INREXT in the last 72 hours.         Dalia HeadingPaul Phallon Haydu, M.D.  [facility]  [place] office [phone]

## 2018-08-08 NOTE — Consults (Signed)
Consults by Travis [name],  Travis Schleifer, MD at 08/08/18 1025                Author: Alfonso [name], Travis Whitson, MD  Service: Infectious Disease  Author Type: Physician       Filed: 08/08/18 1610  Date of Service: 08/08/18 1025  Status: Addendum          Editor: Travis [name], Travis Sunderlin, MD (Physician)          Related Notes: Original Note by Travis [name], Travis Vogel, MD (Physician) filed at 08/08/18 1604                                                                                       INFECTIOUS DISEASE CONSULT NOTE          Requested by: Dr  Travis [name]      Reason for consult: chronic diarrhea      Date of admission: 08/06/2018      Date of consult: August 08, 2018           ABX:           Current abx  Prior abx         nitazoxinide 6/13            ASSESSMENT:            cryptosporidia/ Giardia concomitant infection    source unknown        HIV- new diagnosis        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        neutropenia with adequate anc                         nkda                RECOMMENDATIONS:        1- I asked micro to repeat the testing on the stool that resulted in BOTH + for giardia AND crypto- possible- with exposure to contaminated source water,  but suspicious for error   HIV testing in place and still pending-    Stool for O&P may pick up giardia as well   If both still (+), will send out for multiplex pcr on stool   Rx  With nitazoxanide 1000 mg po bid x 3 d, then, if improved,may  decrease  tp 500 mg po bid to complete  x 14 - 28 d   Monitor stool count   HIV AN +   CD4, VL IP   Will obtain genomic testing - for appropriate HAART  determination    HIV baseline labs- after review of [facility] w/u- to avoid duplication    update vaccines when CD4 adequate   Pt will need to be set up with long term provider for management as outpt for this                           MICROBIOLOGY:           5/27  Neg c diff   6/2    Stool culture neg   6/12  +  for BOTH giardia and cryptosporidia-- lab repeated test with same results     LINES AND CATHETERS:      PIV        HPI:     28 yo XY with chronic diarrhea x "18 days" per pt has been [facility] and  worked up by IM and  GI on several occassions and was re-admitted on 6/11 with RLQ  abdominal pain, N/V and nonbloody diarrhea and 25lbs weight loss x 1 month. WBC count was borderline low. He has enteritis on CT, ddx infectious vs IBD, however it has progressed over the past 2 weeks. GI consultant checked   stool for less common pathogens  and  empirically with IV steroids for IBS to assess clinical response    C diff eval and stool culture neg in past.  Stool for giardia and cryptosporidia AG from 6/12 were positive. HIV test was ordered ad returns with HIV An +, pcr IP.      Pt is MSM.  Has syphilis in march of this year and had std wy with penicillin shot q w x 3 at [facility].  Extensive testing took place , pt uncertain which tests were actually done.          Past medical history:          Past Medical History:        Diagnosis  Date         ?  Asthma       ?  Hypertension       ?  Neurological disorder            Bell's palsy           History reviewed. No pertinent surgical history.         Social History:          Social History          Socioeconomic History         ?  Marital status:  SINGLE              Spouse name:  Not on file         ?  Number of children:  Not on file     ?  Years of education:  Not on file     ?  Highest education level:  Not on file       Occupational History        ?  Not on file       Social Needs         ?  Financial resource strain:  Not on file        ?  Food insecurity              Worry:  Not on file         Inability:  Not on file        ?  Transportation needs              Medical:  Not on file         Non-medical:  Not on file       Tobacco Use         ?  Smoking status:  Never Smoker     ?  Smokeless tobacco:  Never Used       Substance and Sexual Activity         ?  Alcohol use:  No     ?  Drug use:  No     ?  Sexual activity:  Yes              Partners:  Female          Birth control/protection:  Condom       Lifestyle        ?  Physical activity              Days per week:  Not on file         Minutes per session:  Not on file         ?  Stress:  Not on file       Relationships        ?  Social Engineer, manufacturing systems on phone:  Not on file         Gets together:  Not on file         Attends religious service:  Not on file         Active member of club or organization:  Not on file         Attends meetings of clubs or organizations:  Not on file         Relationship status:  Not on file        ?  Intimate partner violence              Fear of current or ex partner:  Not on file         Emotionally abused:  Not on file         Physically abused:  Not on file         Forced sexual activity:  Not on file        Other Topics  Concern        ?  Not on file       Social History Narrative        ?  Not on file             Family History:     History reviewed. No pertinent family history.        Allergies:     No Known Allergies           Home Medications:          Medications Prior to Admission        Medication  Sig         ?  omeprazole (PRILOSEC) 40 mg capsule  Take 1 Cap by mouth Daily (before breakfast).         ?  albuterol (PROAIR HFA) 90 mcg/actuation inhaler  Take 1 Puff by inhalation every four (4) hours as needed for Wheezing.             Current Medications:          Current Facility-Administered Medications          Medication  Dose  Route  Frequency           ?  albuterol (PROVENTIL VENTOLIN) nebulizer solution 2.5 mg   2.5 mg  Nebulization  Q4H PRN     ?  dicyclomine (BENTYL) capsule 10 mg   10 mg  Oral  QID     ?  acetaminophen (TYLENOL) tablet 650 mg   650 mg  Oral  Q4H PRN          Or           ?  acetaminophen (TYLENOL) solution 650 mg   650 mg  Oral  Q4H PRN          Or           ?  acetaminophen (TYLENOL) suppository 650 mg   650 mg  Rectal  Q4H PRN     ?  naloxone (NARCAN) injection 0.1 mg   0.1 mg  IntraVENous  PRN     ?  dextrose 5% - 0.45% NaCl with KCl 20  mEq/L infusion   75 mL/hr  IntraVENous  CONTINUOUS     ?  morphine injection 2 mg   2 mg  IntraVENous  Q4H PRN           ?  ondansetron (ZOFRAN) injection 4 mg   4 mg  IntraVENous  Q4H PRN             Review of Systems:     12 points ROS done. Pertinent positives and negatives are as follows, ROS otherwise negative.      Constitutional: Positive for malaise/fatigue. Negative for chills and  fever.    HENT: Negative for congestion, ear pain, sinus pain and sore throat.     Eyes: Negative for blurred vision and photophobia.    Respiratory: Negative for cough, shortness of breath and wheezing.     Cardiovascular: Negative for chest pain, palpitations and leg swelling.    Gastrointestinal: Positive for abdominal pain, diarrhea , nausea and vomiting. Negative  for constipation and heartburn.    Genitourinary: Negative for dysuria and flank pain.    Musculoskeletal: Negative for myalgias.    Skin: Negative for itching and rash.    Neurological: Positive for weakness. Negative for dizziness and headaches.    Endo/Heme/Allergies: Negative for polydipsia.    Psychiatric/Behavioral: Negative for depression and suicidal ideas. The patient is not nervous/anxious and does not have insomnia.           Physical Exam:   Vitals   Temp (24hrs), Avg:97.8 ??F (36.6 ??C), Min:97.4 ??F (36.3 ??C), Max:98.3 ??F (36.8 ??C)      Visit Vitals      BP  (!) 155/101 (BP 1 Location: Right arm, BP Patient Position: Supine)     Pulse  79     Temp  97.8 ??F (36.6 ??C)     Resp  18     Ht   (1.702 m)     Wt  70.9 kg (156 lb 4.9 oz)     SpO2  100%        BMI  24.48 kg/m??           General: slender, 28 y.o. year-old, male , in no acute distress   HEENT: Normocephalic, anicteric sclerae, Pupils equal, round reactive to light, no oropharyngeal lesions. No sinus tenderness. Dentition in mediocre repair   Neck: Supple, no lymphadenopathy, masses or thyromegaly   Chest: Symmetrical expansion   Lungs: Clear to auscultation bilaterally, no dullness   Heart:  Regular rhythm, no murmur, no rub or gallop, No JVD   Abdomen: Soft, some TTP, no rebound.slightly distended, no organomegaly, BS+   Musculoskeletal: Normal strength/tone. No edema. No clubbing or cyanosis   CNS: AAOx3. Cranial nerves II-XII intact. Grossly normal.No NR   SKIN: No skin lesion or rash. Dry, warm, intact                 Labs:  Results:        Chemistry  Recent Labs  08/07/18   0259  08/06/18   1229  08/06/18   1114      GLU  118*   --   121*      NA  134*   --   132*      K  3.7   --   3.1*      CL  107   --   104      CO2  18*  11.0*  13*      BUN  48*   --   75*      CREA  1.7*   --   2.1*      CA  8.3*   --   9.0      AGAP  8   --   15      AP  107   --   129*      TP  8.6*   --   10.2*      ALB  3.4   --   4.3                 CBC w/Diff  Recent Labs         08/07/18   0259  08/06/18   1114      WBC  2.9*  4.8      RBC  5.42  6.39*      HGB  15.4  18.0*      HCT  44.3  51.0*      PLT  141  152      GRANS  54.5  63.9      LYMPH  31.8  16.8*      EOS  0.0  0.4                 Microbiology  No results for input(s): CULT in the last 72 hours.           Imaging-        Results from Hospital Encounter encounter on 07/21/18     XR ABD (AP AND ERECT OR DECUB)           Narrative  Clinical history: Abdominal pain, ileus      EXAMINATION:   Supine and upright projections of the abdomen 07/30/2018      FINDINGS:   Air is noted in the ascending, transverse and portions of the descending colon   and rectosigmoid. No dilated loops of small or bowel. Multiple air-fluid levels   on the upright projection.              Impression  IMPRESSION:   Ileus.                Results from Hospital Encounter encounter on 08/06/18     CT ABD PELV WO CONT           Narrative  CT Abdomen and Pelvis without      Indication: Abdominal pain. Nausea, vomiting, diarrhea.      Comparison: 07/22/2018.      TECHNIQUE:    CT of the abdomen and pelvis WITHOUT intravenous contrast. Coronal and sagittal   reformations were obtained.  Oral contrast was given.      All CT exams at this facility use one or more dose reduction techniques   including automatic exposure control, mA/kV adjustment per patient's size, or   iterative reconstruction technique. DICOM format imaged data is available to   non-affiliated external healthcare facilities  or entities on a secure, media   free, reciprocal searchable basis with patient authorization for 12 months   following the date of the study.      DISCUSSION:      ABSENCE OF INTRAVENOUS CONTRAST DECREASES SENSITIVITY FOR DETECTION OF FOCAL   LESIONS AND VASCULAR PATHOLOGY.      LOWER THORAX: Normal.      HEPATOBILIARY: No focal hepatic lesions.  No biliary ductal dilatation.   SPLEEN: No splenomegaly.   PANCREAS: No focal masses or ductal dilatation.      ADRENALS: No adrenal nodules.   KIDNEYS/URETERS: No hydronephrosis, stones, or solid mass lesions.   PELVIC ORGANS/BLADDER: Unremarkable.      PERITONEUM / RETROPERITONEUM: No free air or fluid.   LYMPH NODES: No lymphadenopathy.   VESSELS: Unremarkable.      GI TRACT: Multiple nondistended fluid-filled loops of small bowel with   questionable mild wall thickening. Fluid-filled colon with no significant wall   thickening. No obstruction. Normal appendix.      BONES AND SOFT TISSUES: No acute abnormality.              Impression  IMPRESSION:      Numerous fluid-filled loops of small and large bowel with borderline diffuse   small bowel wall thickening, concerning for infectious or inflammatory   enterocolitis. Findings have slightly progressed since 07/22/2018.              ---------------------------------------------------------------------------------------------------------------   I have independently examined the patient and reviewed all lab studies and imgaing as well as review of nursing notes and physican notes          Jearld Adjutant, MD   08/08/2018      [facility]   [phone]

## 2018-08-08 NOTE — Progress Notes (Signed)
Progress Notes by Franky Machoicketts, Ashawna Hanback A, MD at 08/08/18 1010                Author: Franky Machoicketts, Wynn Kernes A, MD  Service: Gastroenterology  Author Type: Physician       Filed: 08/08/18 1020  Date of Service: 08/08/18 1010  Status: Signed          Editor: Franky Machoicketts, Mikell Kazlauskas A, MD (Physician)                       A member of [facility]            GI Progress Note      Admit Date: 08/06/2018        Assessment:     1.  Recurrent N/v, abd pain, diarrhea??(nonbloody). CT scan showing infectious or inflammatory enterocolitis-progressed from prior CT   ??  Admit recently for similar symptoms, CT w/ enteritis. Stool cultures/C.diff were neg.    ??  +cryptosporidium/giardia ag   ??  COVID neg 5/27   2.  Elevated LFTS--Liver unremarkable on CT imaging. Viral panel neg??   3.  AKI - dehyrdation from diarrhea?   4.  HTN   5.  Asthma   6.  Bell's palsy           Plan:     ??  IV steroids d/c'd   ??  Await HIV and CD4 status   ??  Nitazoxanide 1000mg  bid - not on formulary per pharmacist   ??  ID consult       Case discussed with Dr. Hyacinth MeekerMiller and Dr. Glynda JaegerMooney   Pt and his mother updated at bedside      Dalia HeadingPaul Azel Gumina, M.D.    [facility]    [place] Office: [phone]         Subjective:        Still with diarrhea and some abd pain. No vomiting or bloody stools   No fever           Objective:     Physical Exam:   Visit Vitals      BP  (!) 155/101 (BP 1 Location: Right arm, BP Patient Position: Supine)     Pulse  79     Temp  97.8 ??F (36.6 ??C)     Resp  18     Ht  5\' 7"  (1.702 m)     Wt  70.9 kg (156 lb 4.9 oz)     SpO2  100%        BMI  24.48 kg/m??              Gen: aaox3, NAD, gaunt   CV: RRR no m   Pulm: CTA bilat   Abd: soft, NT, ND +BS   Ext: no LE edema bilat        Current Facility-Administered Medications          Medication  Dose  Route  Frequency           ?  albuterol (PROVENTIL VENTOLIN) nebulizer solution 2.5 mg   2.5 mg  Nebulization  Q4H PRN     ?  dicyclomine (BENTYL) capsule 10 mg    10 mg  Oral  QID     ?  acetaminophen (TYLENOL) tablet 650 mg   650 mg  Oral  Q4H PRN          Or           ?  acetaminophen (TYLENOL) solution 650 mg   650 mg  Oral  Q4H PRN          Or           ?  acetaminophen (TYLENOL) suppository 650 mg   650 mg  Rectal  Q4H PRN     ?  naloxone (NARCAN) injection 0.1 mg   0.1 mg  IntraVENous  PRN     ?  dextrose 5% - 0.45% NaCl with KCl 20 mEq/L infusion   75 mL/hr  IntraVENous  CONTINUOUS     ?  morphine injection 2 mg   2 mg  IntraVENous  Q4H PRN           ?  ondansetron (ZOFRAN) injection 4 mg   4 mg  IntraVENous  Q4H PRN           Labs        CBC w/Diff     Recent Labs         08/07/18   0259  08/06/18   1114      WBC  2.9*  4.8      HGB  15.4  18.0*      HCT  44.3  51.0*      MCV  81.7  79.8*      PLT  141  152                  Hepatic Function     Recent Labs         08/07/18   0259  08/06/18   1114      ALT  72  82*      AP  107  129*      TBILI  0.9  1.0      ALB  3.4  4.3      TP  8.6*  10.2*                  Pancreatic Enzymes     Recent Labs         08/06/18   1114      LPSE  178                  Basic Metabolic Profile     Recent Labs         08/07/18   0259  08/06/18   1229  08/06/18   1114      NA  134*   --   132*      K  3.7   --   3.1*      CL  107   --   104      CO2  18*  11.0*  13*      BUN  48*   --   75*      GLU  118*   --   121*      CREA  1.7*   --   2.1*      CA  8.3*   --   9.0                  Coagulation       No results for input(s): PTP, INR, APTT, INREXT in the last 72 hours.              Bea Graff, M.D.   [facility] [place] office [phone]

## 2018-08-08 NOTE — Progress Notes (Signed)
Hospitalist Progress Note               NAME:  Travis [name]   DOB:   1990-06-20   MRN:   [id number]     PCP:  Janell Quiet, MD  Date/Time:  08/08/2018 11:17 AM      Assessment/Plan:   1. Enterocolitis appreciat gi assistance  Work up for ibd ongoing and looks like its cryptosporidium and giardia are posiive is likely he may be hiv+ his lymphocytes are low/ I bet I went over his probablily of hiv treatmetn etc  Once w get labs back        Will hae to check with his regular Drr if she is comfortable treating it or if needs ID               referral advance diet  2. Dehydration/abnormal electrolytes working   3. htn off meds  Been up but is in pain so  4. Asthma noton contrller   5. Hx of bells paulsy   6. Recent abn lft resolved  7. Hx of multiple abscesses  8. Urine pos for opiates he denies        Subjective:   Still abdominal cramping denies well h20/ camping etc  ROS:   ROS  No fever  No chest pain   Objective:       Visit Vitals  BP 135/86 (BP 1 Location: Right arm, BP Patient Position: Supine)   Pulse 67   Temp 97.9 ??F (36.6 ??C)   Resp 18   Ht 5\' 7"  (1.702 m)   Wt 70.9 kg (156 lb 4.9 oz)   SpO2 100%   BMI 24.48 kg/m??      O2 Device: Room air    Temp (24hrs), Avg:97.8 ??F (36.6 ??C), Min:97.4 ??F (36.3 ??C), Max:98.3 ??F (36.8 ??C)    No intake/output data recorded.   06/11 1901 - 06/13 0700  In: 3388.8 [P.O.:575; I.V.:2813.8]  Out: 1550 [Urine:1250]    Physical Exam  Constitutional:       Appearance: He is underweight. He is not ill-appearing, toxic-appearing or diaphoretic.   HENT:      Head: Normocephalic and atraumatic.   Eyes:      General: No scleral icterus.  Neck:      Musculoskeletal: Neck supple.   Cardiovascular:      Rate and Rhythm: Normal rate and regular rhythm.      Heart sounds: No murmur.   Pulmonary:      Effort: Pulmonary effort is normal. No respiratory distress.      Breath sounds: No stridor. No wheezing, rhonchi or rales.   Chest:      Chest wall: No tenderness.   Abdominal:      Palpations:  Abdomen is soft. There is no mass.      Tenderness: There is abdominal tenderness. There is no guarding.   Skin:     Coloration: Skin is not jaundiced.   Neurological:      Mental Status: He is alert and oriented to person, place, and time.   Psychiatric:         Mood and Affect: Mood normal.         Behavior: Behavior is cooperative.       Data Review:       24 Hour Results:  No results found for this or any previous visit (from the past 24 hour(s)).    Problem List:  Problem List as of 08/08/2018 Date  Reviewed: 05/03/2014          Codes Class Noted - Resolved    Dehydration ICD-10-CM: E86.0  ICD-9-CM: 276.51  08/06/2018 - Present        Enterocolitis ICD-10-CM: K52.9  ICD-9-CM: 558.9  08/06/2018 - Present        Intractable nausea and vomiting ICD-10-CM: R11.2  ICD-9-CM: 536.2  08/06/2018 - Present        AKI (acute kidney injury) (HCC) ICD-10-CM: N17.9  ICD-9-CM: 584.9  07/22/2018 - Present               Medications reviewed  Current Facility-Administered Medications   Medication Dose Route Frequency   ??? albuterol (PROVENTIL VENTOLIN) nebulizer solution 2.5 mg  2.5 mg Nebulization Q4H PRN   ??? dicyclomine (BENTYL) capsule 10 mg  10 mg Oral QID   ??? acetaminophen (TYLENOL) tablet 650 mg  650 mg Oral Q4H PRN    Or   ??? acetaminophen (TYLENOL) solution 650 mg  650 mg Oral Q4H PRN    Or   ??? acetaminophen (TYLENOL) suppository 650 mg  650 mg Rectal Q4H PRN   ??? naloxone (NARCAN) injection 0.1 mg  0.1 mg IntraVENous PRN   ??? dextrose 5% - 0.45% NaCl with KCl 20 mEq/L infusion  75 mL/hr IntraVENous CONTINUOUS   ??? morphine injection 2 mg  2 mg IntraVENous Q4H PRN   ??? ondansetron (ZOFRAN) injection 4 mg  4 mg IntraVENous Q4H PRN        No Known Allergies    Care Plan discussed with: Patient/Family, Nurse and Consultant gastro    Total time spent with patient: 40 minutes.    Omar PersonJulius Rosabel Sermeno, MD  August 08, 2018  11:17 AM

## 2018-08-09 LAB — CBC WITH AUTO DIFFERENTIAL
Basophils %: 0.6 % (ref 0–3)
Eosinophils %: 1.3 % (ref 0–5)
Hematocrit: 36.3 % — ABNORMAL LOW (ref 37.0–50.0)
Hemoglobin: 12.9 gm/dl (ref 12.4–17.2)
Immature Granulocytes: 0.3 % (ref 0.0–3.0)
Lymphocytes %: 20.4 % — ABNORMAL LOW (ref 28–48)
MCH: 28.4 pg (ref 23.0–34.6)
MCHC: 35.5 gm/dl (ref 30.0–36.0)
MCV: 79.8 fL — ABNORMAL LOW (ref 80.0–98.0)
MPV: 13.7 fL — ABNORMAL HIGH (ref 6.0–10.0)
Monocytes %: 22 % — ABNORMAL HIGH (ref 1–13)
Neutrophils %: 55.4 % (ref 34–64)
Nucleated RBCs: 0 (ref 0–0)
Platelets: 113 10*3/uL — ABNORMAL LOW (ref 140–450)
RBC: 4.55 M/uL (ref 3.80–5.70)
RDW-SD: 37.4 (ref 35.1–43.9)
WBC: 3.1 10*3/uL — ABNORMAL LOW (ref 4.0–11.0)

## 2018-08-09 LAB — CBC WITH AUTOMATED DIFF
BASOPHILS: 0.6 % (ref 0–3)
EOSINOPHILS: 1.3 % (ref 0–5)
HCT: 36.3 % — ABNORMAL LOW (ref 37.0–50.0)
HGB: 12.9 gm/dl (ref 12.4–17.2)
IMMATURE GRANULOCYTES: 0.3 % (ref 0.0–3.0)
LYMPHOCYTES: 20.4 % — ABNORMAL LOW (ref 28–48)
MCH: 28.4 pg (ref 23.0–34.6)
MCHC: 35.5 gm/dl (ref 30.0–36.0)
MCV: 79.8 fL — ABNORMAL LOW (ref 80.0–98.0)
MONOCYTES: 22 % — ABNORMAL HIGH (ref 1–13)
MPV: 13.7 fL — ABNORMAL HIGH (ref 6.0–10.0)
NEUTROPHILS: 55.4 % (ref 34–64)
NRBC: 0 (ref 0–0)
PLATELET: 113 10*3/uL — ABNORMAL LOW (ref 140–450)
RBC: 4.55 M/uL (ref 3.80–5.70)
RDW-SD: 37.4 (ref 35.1–43.9)
WBC: 3.1 10*3/uL — ABNORMAL LOW (ref 4.0–11.0)

## 2018-08-09 MED FILL — DICYCLOMINE 10 MG CAP: 10 mg | ORAL | Qty: 1

## 2018-08-09 MED FILL — ALINIA 500 MG TABLET: 500 mg | ORAL | Qty: 2

## 2018-08-09 NOTE — Other (Signed)
Bedside and Verbal shift change report given to Rachelle A Ventura, RN (oncoming nurse) by Kasandra, RN (offgoing nurse). Report included the following information SBAR and Kardex.

## 2018-08-09 NOTE — Progress Notes (Signed)
Problem: Falls - Risk of  Goal: *Absence of Falls  Description: Document Schmid Fall Risk and appropriate interventions in the flowsheet.  Outcome: Progressing Towards Goal  Note: Fall Risk Interventions:  Mobility Interventions: Bed/chair exit alarm         Medication Interventions: Bed/chair exit alarm    Elimination Interventions: Call light in reach, Bed/chair exit alarm, Urinal in reach, Toilet paper/wipes in reach, Patient to call for help with toileting needs

## 2018-08-09 NOTE — Other (Signed)
Bedside and Verbal shift change report given to Kasandra F Keller (oncoming nurse) by Rachelle, RN (offgoing nurse). Report included the following information SBAR and Kardex.

## 2018-08-09 NOTE — Progress Notes (Signed)
Problem: Falls - Risk of  Goal: *Absence of Falls  Description: Document Schmid Fall Risk and appropriate interventions in the flowsheet.  Outcome: Progressing Towards Goal  Note: Fall Risk Interventions:  Mobility Interventions: Bed/chair exit alarm, Patient to call before getting OOB         Medication Interventions: Bed/chair exit alarm, Patient to call before getting OOB    Elimination Interventions: Call light in reach, Bed/chair exit alarm, Toilet paper/wipes in reach

## 2018-08-09 NOTE — Consults (Addendum)
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    neutropenia with adequate anc             nkda         RECOMMENDATIONS:     1- micro  repeated the testing on the stool that resulted in BOTH + for giardia AND crypto- possible- with exposure to contaminated source water,  Again +  HIV testing + HIV An, pcr, cd4 IP, genomic testing for resistance ordered    Rx  With nitazoxanide 1000 mg po bid x 3 d, then, if improved,may  decrease  to 500 mg po bid to complete  x 14 - 28 d  Monitor stool count  HIV AN +  CD4, VL IP  Will obtain genomic testing - for appropriate HAART  determination   HIV baseline labs- after review of [facility] w/u- to avoid duplication   update vaccines when CD4 adequate- when out pt with other ID provider  Pt will need to be set up with long term provider for management as outpt for this- DW pt in detail                     MICROBIOLOGY:       5/27  Neg c diff  6/2    Stool culture neg  6/12  + for BOTH giardia and cryptosporidia-- lab repeated test with same results  LINES AND CATHETERS:   PIV    CC   Still significant # stools. At least 5/24h.   No great improvement as yet    Past medical history:     Past Medical History:   Diagnosis Date   ??? Asthma    ??? Hypertension    ??? Neurological disorder     Bell's palsy       History reviewed. No pertinent surgical history.           Current Medications:     Current Facility-Administered Medications   Medication Dose Route Frequency   ??? nitazoxanide (ALINIA) tablet 1,000 mg  1,000 mg Oral BID WITH MEALS    ??? albuterol (PROVENTIL VENTOLIN) nebulizer solution 2.5 mg  2.5 mg Nebulization Q4H PRN   ??? dicyclomine (BENTYL) capsule 10 mg  10 mg Oral QID   ??? acetaminophen (TYLENOL) tablet 650 mg  650 mg Oral Q4H PRN    Or   ??? acetaminophen (TYLENOL) solution 650 mg  650 mg Oral Q4H PRN    Or   ??? acetaminophen (TYLENOL) suppository 650 mg  650 mg Rectal Q4H PRN   ??? naloxone (NARCAN) injection 0.1 mg  0.1 mg IntraVENous PRN   ??? dextrose 5% - 0.45% NaCl with KCl 20 mEq/L infusion  75 mL/hr IntraVENous CONTINUOUS   ??? morphine injection 2 mg  2 mg IntraVENous Q4H PRN   ??? ondansetron (ZOFRAN) injection 4 mg  4 mg IntraVENous Q4H PRN         Physical Exam:  Vitals  Temp (24hrs), Avg:98.6 ??F (37 ??C), Min:98.4 ??F (36.9 ??C), Max:98.8 ??F (37.1 ??C)    Visit Vitals  BP 137/68 (BP Patient Position: Supine)   Pulse 75   Temp 98.6 ??F (37 ??C)   Resp 16   Ht 5\' 7"  (1.702 m)   Wt 72.3 kg (159 lb 6.3 oz)   SpO2 100%   BMI 24.96 kg/m??       General: slender, 28 y.o. year-old, male, in no acute distress  HEENT: Normocephalic, anicteric sclerae, Pupils equal, round reactive to light, no oropharyngeal lesions. No sinus tenderness. Dentition in mediocre repair  Neck: Supple, no lymphadenopathy, masses or thyromegaly  Chest: Symmetrical expansion  Lungs: Clear to auscultation bilaterally, no dullness  Heart: Regular rhythm, no murmur, no rub or gallop, No JVD  Abdomen: Soft, some TTP, no rebound.slightly distended, no organomegaly, BS+  Musculoskeletal: Normal strength/tone. No edema. No clubbing or cyanosis  CNS: AAOx3. Cranial nerves II-XII intact. Grossly normal.No NR  SKIN: No skin lesion or rash. Dry, warm, intact          Labs: Results:   Chemistry Recent Labs     08/07/18  0259 08/06/18  1229   GLU 118*  --    NA 134*  --    K 3.7  --    CL 107  --    CO2 18* 11.0*   BUN 48*  --    CREA 1.7*  --    CA 8.3*  --    AGAP 8  --    AP 107  --    TP 8.6*  --    ALB 3.4  --       CBC w/Diff Recent Labs     08/07/18  0259   WBC 2.9*   RBC 5.42    HGB 15.4   HCT 44.3   PLT 141   GRANS 54.5   LYMPH 31.8   EOS 0.0      Microbiology No results for input(s): CULT in the last 72 hours.       Imaging-    Results from Hospital Encounter encounter on 07/21/18   XR ABD (AP AND ERECT OR DECUB)    Narrative Clinical history: Abdominal pain, ileus    EXAMINATION:  Supine and upright projections of the abdomen 07/30/2018    FINDINGS:  Air is noted in the ascending, transverse and portions of the descending colon  and rectosigmoid. No dilated loops of small or bowel. Multiple air-fluid levels  on the upright projection.      Impression IMPRESSION:  Ileus.         Results from Hospital Encounter encounter on 08/06/18   CT ABD PELV WO CONT    Narrative CT Abdomen and Pelvis without    Indication: Abdominal pain. Nausea, vomiting, diarrhea.    Comparison: 07/22/2018.    TECHNIQUE:   CT of the abdomen and pelvis WITHOUT intravenous contrast. Coronal and sagittal  reformations were obtained. Oral contrast was given.    All CT exams at this facility use one or more dose reduction techniques  including automatic exposure control, mA/kV adjustment per patient's size, or  iterative reconstruction technique. DICOM format imaged data is available to  non-affiliated external healthcare facilities or entities on a secure, media  free, reciprocal searchable basis with patient authorization for 12 months  following the date of the study.    DISCUSSION:    ABSENCE OF INTRAVENOUS CONTRAST DECREASES SENSITIVITY FOR DETECTION OF FOCAL  LESIONS AND VASCULAR PATHOLOGY.    LOWER THORAX: Normal.    HEPATOBILIARY: No focal hepatic lesions.  No biliary ductal dilatation.  SPLEEN: No splenomegaly.  PANCREAS: No focal masses or ductal dilatation.    ADRENALS: No adrenal nodules.  KIDNEYS/URETERS: No hydronephrosis, stones, or solid mass lesions.  PELVIC ORGANS/BLADDER: Unremarkable.    PERITONEUM / RETROPERITONEUM: No free air or fluid.  LYMPH NODES: No lymphadenopathy.  VESSELS: Unremarkable.     GI TRACT: Multiple nondistended fluid-filled loops of small bowel with  questionable mild wall thickening. Fluid-filled colon with no significant wall  thickening. No obstruction. Normal appendix.    BONES AND SOFT TISSUES: No acute abnormality.      Impression IMPRESSION:    Numerous fluid-filled loops of small and large bowel with borderline diffuse  small bowel wall thickening, concerning for infectious or inflammatory  enterocolitis. Findings have slightly progressed since 07/22/2018.         ---------------------------------------------------------------------------------------------------------------  I have independently examined the patient and reviewed all lab studies and imgaing as well as review of nursing notes and physican notes       Alfonso EllisMartha Shakiah Wester, MD  08/09/2018    [facility]  [phone]

## 2018-08-09 NOTE — Progress Notes (Signed)
Pt had 3 x BM today during day shift. Will endorse to night shift nurse.

## 2018-08-09 NOTE — Progress Notes (Signed)
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    neutropenia with adequate anc             nkda         RECOMMENDATIONS:     1- I asked micro to repeat the testing on the stool that resulted in BOTH + for giardia AND crypto- possible- with exposure to contaminated source water,  but suspicious for error  HIV testing in place and still pending-   Stool for O&P may pick up giardia as well  If both still (+), will send out for multiplex pcr on stool  Rx  With nitazoxanide 1000 mg po bid x 3 d, then, if improved,may  decrease  tp 500 mg po bid to complete  x 14 - 28 d  Monitor stool count  HIV AN +  CD4, VL IP  Will obtain genomic testing - for appropriate HAART  determination   HIV baseline labs- after review of [facility] w/u- to avoid duplication   update vaccines when CD4 adequate  Pt will need to be set up with long term provider for management as outpt for this                     MICROBIOLOGY:       5/27  Neg c diff  6/2    Stool culture neg  6/12  + for BOTH giardia and cryptosporidia-- lab repeated test with same results  LINES AND CATHETERS:   PIV    HPI:   28 yo XY with chronic diarrhea x "18 days" per pt has [facility] and  worked up by IM and  GI on several occassions and was re-admitted on 6/11 with RLQ abdominal pain, N/V and nonbloody diarrhea and 25lbs weight loss x 1 month. WBC count was borderline low. He has enteritis on CT, ddx infectious vs IBD, however it has progressed over the past 2 weeks. GI consultant checked   stool for less common pathogens and  empirically with IV steroids for IBS to assess clinical response    C diff eval and stool culture neg in past.  Stool for giardia and cryptosporidia AG from 6/12 were positive. HIV test was ordered ad returns with HIV An +, pcr IP.    Pt is MSM.  Has syphilis in march of this year and had std wy with penicillin shot q w x 3 at [facility].  Extensive testing took place , pt uncertain which tests were actually done.      Past medical history:     Past Medical History:   Diagnosis Date   ??? Asthma    ??? Hypertension    ??? Neurological disorder     Bell's palsy       History reviewed. No pertinent surgical history.     Social History:     Social History     Socioeconomic History   ??? Marital status: SINGLE     Spouse name: Not on  file   ??? Number of children: Not on file   ??? Years of education: Not on file   ??? Highest education level: Not on file   Occupational History   ??? Not on file   Social Needs   ??? Financial resource strain: Not on file   ??? Food insecurity     Worry: Not on file     Inability: Not on file   ??? Transportation needs     Medical: Not on file     Non-medical: Not on file   Tobacco Use   ??? Smoking status: Never Smoker   ??? Smokeless tobacco: Never Used   Substance and Sexual Activity   ??? Alcohol use: No   ??? Drug use: No   ??? Sexual activity: Yes     Partners: Female     Birth control/protection: Condom   Lifestyle   ??? Physical activity     Days per week: Not on file     Minutes per session: Not on file   ??? Stress: Not on file   Relationships   ??? Social Wellsite geologistconnections     Talks on phone: Not on file     Gets together: Not on file     Attends religious service: Not on file     Active member of club or organization: Not on file     Attends meetings of clubs or organizations: Not on file     Relationship status: Not on file   ??? Intimate partner violence     Fear of current or ex partner: Not on file     Emotionally abused: Not on file     Physically abused: Not on file     Forced sexual activity: Not on file   Other Topics Concern   ??? Not on file   Social History Narrative   ??? Not on file        Family History:   History reviewed. No pertinent family history.    Allergies:   No Known Allergies      Home Medications:     Medications Prior to Admission   Medication Sig   ??? omeprazole (PRILOSEC) 40 mg capsule Take 1 Cap by mouth Daily (before breakfast).   ??? albuterol (PROAIR HFA) 90 mcg/actuation inhaler Take 1 Puff by inhalation every four (4) hours as needed for Wheezing.       Current Medications:     Current Facility-Administered Medications   Medication Dose Route Frequency   ??? nitazoxanide (ALINIA) tablet 1,000 mg  1,000 mg Oral BID WITH MEALS   ??? albuterol (PROVENTIL VENTOLIN) nebulizer solution 2.5 mg  2.5 mg Nebulization Q4H PRN   ??? dicyclomine (BENTYL) capsule 10 mg  10 mg Oral QID   ??? acetaminophen (TYLENOL) tablet 650 mg  650 mg Oral Q4H PRN    Or   ??? acetaminophen (TYLENOL) solution 650 mg  650 mg Oral Q4H PRN    Or   ??? acetaminophen (TYLENOL) suppository 650 mg  650 mg Rectal Q4H PRN   ??? naloxone (NARCAN) injection 0.1 mg  0.1 mg IntraVENous PRN   ??? dextrose 5% - 0.45% NaCl with KCl 20 mEq/L infusion  75 mL/hr IntraVENous CONTINUOUS   ??? morphine injection 2 mg  2 mg IntraVENous Q4H PRN   ??? ondansetron (ZOFRAN) injection 4 mg  4 mg IntraVENous Q4H PRN       Review of Systems:   12 points ROS done. Pertinent positives and negatives are  as follows, ROS otherwise negative.    Constitutional: Positive for malaise/fatigue. Negative for chills and fever.   HENT: Negative for congestion, ear pain, sinus pain and sore throat.    Eyes: Negative for blurred vision and photophobia.   Respiratory: Negative for cough, shortness of breath and wheezing.    Cardiovascular: Negative for chest pain, palpitations and leg swelling.   Gastrointestinal: Positive for abdominal pain, diarrhea, nausea and vomiting. Negative for constipation and heartburn.   Genitourinary: Negative for dysuria and flank pain.   Musculoskeletal: Negative for myalgias.   Skin: Negative for itching and rash.    Neurological: Positive for weakness. Negative for dizziness and headaches.   Endo/Heme/Allergies: Negative for polydipsia.   Psychiatric/Behavioral: Negative for depression and suicidal ideas. The patient is not nervous/anxious and does not have insomnia.        Physical Exam:  Vitals  Temp (24hrs), Avg:98.4 ??F (36.9 ??C), Min:97.9 ??F (36.6 ??C), Max:98.8 ??F (37.1 ??C)    Visit Vitals  BP 144/90 (BP Patient Position: Supine)   Pulse 89   Temp 98.4 ??F (36.9 ??C)   Resp 16   Ht 5\' 7"  (1.702 m)   Wt 72.3 kg (159 lb 6.3 oz)   SpO2 100%   BMI 24.96 kg/m??       General: slender, 28 y.o. year-old, male, in no acute distress  HEENT: Normocephalic, anicteric sclerae, Pupils equal, round reactive to light, no oropharyngeal lesions. No sinus tenderness. Dentition in mediocre repair  Neck: Supple, no lymphadenopathy, masses or thyromegaly  Chest: Symmetrical expansion  Lungs: Clear to auscultation bilaterally, no dullness  Heart: Regular rhythm, no murmur, no rub or gallop, No JVD  Abdomen: Soft, some TTP, no rebound.slightly distended, no organomegaly, BS+  Musculoskeletal: Normal strength/tone. No edema. No clubbing or cyanosis  CNS: AAOx3. Cranial nerves II-XII intact. Grossly normal.No NR  SKIN: No skin lesion or rash. Dry, warm, intact          Labs: Results:   Chemistry Recent Labs     08/07/18  0259 08/06/18  1229 08/06/18  1114   GLU 118*  --  121*   NA 134*  --  132*   K 3.7  --  3.1*   CL 107  --  104   CO2 18* 11.0* 13*   BUN 48*  --  75*   CREA 1.7*  --  2.1*   CA 8.3*  --  9.0   AGAP 8  --  15   AP 107  --  129*   TP 8.6*  --  10.2*   ALB 3.4  --  4.3      CBC w/Diff Recent Labs     08/07/18  0259 08/06/18  1114   WBC 2.9* 4.8   RBC 5.42 6.39*   HGB 15.4 18.0*   HCT 44.3 51.0*   PLT 141 152   GRANS 54.5 63.9   LYMPH 31.8 16.8*   EOS 0.0 0.4      Microbiology No results for input(s): CULT in the last 72 hours.       Imaging-    Results from Hospital Encounter encounter on 07/21/18   XR ABD (AP AND ERECT OR DECUB)     Narrative Clinical history: Abdominal pain, ileus    EXAMINATION:  Supine and upright projections of the abdomen 07/30/2018    FINDINGS:  Air is noted in the ascending, transverse and portions of the descending colon  and rectosigmoid. No dilated loops of small  or bowel. Multiple air-fluid levels  on the upright projection.      Impression IMPRESSION:  Ileus.         Results from [place] encounter on 08/06/18   CT ABD PELV WO CONT    Narrative CT Abdomen and Pelvis without    Indication: Abdominal pain. Nausea, vomiting, diarrhea.    Comparison: 07/22/2018.    TECHNIQUE:   CT of the abdomen and pelvis WITHOUT intravenous contrast. Coronal and sagittal  reformations were obtained. Oral contrast was given.    All CT exams at this facility use one or more dose reduction techniques  including automatic exposure control, mA/kV adjustment per patient's size, or  iterative reconstruction technique. DICOM format imaged data is available to  non-affiliated external healthcare facilities or entities on a secure, media  free, reciprocal searchable basis with patient authorization for 12 months  following the date of the study.    DISCUSSION:    ABSENCE OF INTRAVENOUS CONTRAST DECREASES SENSITIVITY FOR DETECTION OF FOCAL  LESIONS AND VASCULAR PATHOLOGY.    LOWER THORAX: Normal.    HEPATOBILIARY: No focal hepatic lesions.  No biliary ductal dilatation.  SPLEEN: No splenomegaly.  PANCREAS: No focal masses or ductal dilatation.    ADRENALS: No adrenal nodules.  KIDNEYS/URETERS: No hydronephrosis, stones, or solid mass lesions.  PELVIC ORGANS/BLADDER: Unremarkable.    PERITONEUM / RETROPERITONEUM: No free air or fluid.  LYMPH NODES: No lymphadenopathy.  VESSELS: Unremarkable.    GI TRACT: Multiple nondistended fluid-filled loops of small bowel with  questionable mild wall thickening. Fluid-filled colon with no significant wall  thickening. No obstruction. Normal appendix.    BONES AND SOFT TISSUES: No acute abnormality.       Impression IMPRESSION:    Numerous fluid-filled loops of small and large bowel with borderline diffuse  small bowel wall thickening, concerning for infectious or inflammatory  enterocolitis. Findings have slightly progressed since 07/22/2018.         ---------------------------------------------------------------------------------------------------------------  I have independently examined the patient and reviewed all lab studies and imgaing as well as review of nursing notes and physican notes       Jearld Adjutant, MD  08/09/2018    [facility]  [phone]

## 2018-08-09 NOTE — Progress Notes (Addendum)
A member of [facility]        GI Progress Note    Admit Date: 08/06/2018    Assessment:   1. 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   2. HIV + - new diagnosis.    3. Elevated LFTS--Liver unremarkable on CT imaging. Viral panel neg??  4. AKI   5. Co-morbidities - HTN, Asthma, Bell's palsy    Plan:   ?? Continue nitazoxanide bid for 2-4 weeks.  Appreciate ID recs  ?? HIV PCR/CD4 levels, and HIV resistance panel  pending  ?? Will need close follow up with Dr. Jake Michaelis and ID as outpatient  ?? No plan for endoscopic evaluation    Will be available as needed.  Please call if questions.    Bea Graff, M.D.   [facility] [place] Office: [phone]     Subjective:     Still with diarrhea 2-3 episodes, nonbloody.  +abd discomfort/fullness. No vomiting.      Objective:   Physical Exam:  Visit Vitals  BP 144/90 (BP Patient Position: Supine)   Pulse 89   Temp 98.4 ??F (36.9 ??C)   Resp 16   Ht 5\' 7"  (1.702 m)   Wt 72.3 kg (159 lb 6.3 oz)   SpO2 100%   BMI 24.96 kg/m??         Gen: aaox3, NAD, gaunt  CV: RRR no m  Pulm: CTA bilat  Abd: soft, NT, ND +BS   Ext: no LE edema bilat    Current Facility-Administered Medications   Medication Dose Route Frequency   ??? nitazoxanide (ALINIA) tablet 1,000 mg  1,000 mg Oral BID WITH MEALS   ??? albuterol (PROVENTIL VENTOLIN) nebulizer solution 2.5 mg  2.5 mg Nebulization Q4H PRN   ??? dicyclomine (BENTYL) capsule 10 mg  10 mg Oral QID   ??? acetaminophen (TYLENOL) tablet 650 mg  650 mg Oral Q4H PRN    Or   ??? acetaminophen (TYLENOL) solution 650 mg  650 mg Oral Q4H PRN    Or   ??? acetaminophen (TYLENOL) suppository 650 mg  650 mg Rectal Q4H PRN   ??? naloxone (NARCAN) injection 0.1 mg  0.1 mg IntraVENous PRN   ??? dextrose 5% - 0.45% NaCl with KCl 20 mEq/L infusion  75 mL/hr IntraVENous CONTINUOUS   ??? morphine injection 2 mg  2 mg IntraVENous Q4H PRN   ??? ondansetron (ZOFRAN) injection 4 mg  4 mg IntraVENous Q4H PRN       Labs     CBC w/Diff   Recent Labs     08/07/18  0259 08/06/18  1114   WBC 2.9* 4.8   HGB 15.4 18.0*   HCT 44.3 51.0*   MCV 81.7 79.8*   PLT 141 152        Hepatic Function   Recent Labs     08/07/18  0259 08/06/18  1114   ALT 72 82*   AP 107 129*   TBILI 0.9 1.0   ALB 3.4 4.3   TP 8.6* 10.2*        Pancreatic Enzymes   Recent Labs     08/06/18  1114   LPSE 178        Basic Metabolic Profile   Recent Labs     08/07/18  0259 08/06/18  1229 08/06/18  1114   NA 134*  --  132*   K 3.7  --  3.1*   CL 107  --  104  CO2 18* 11.0* 13*   BUN 48*  --  75*   GLU 118*  --  121*   CREA 1.7*  --  2.1*   CA 8.3*  --  9.0        Coagulation   No results for input(s): PTP, INR, APTT, INREXT in the last 72 hours.         Dalia HeadingPaul Deanne Bedgood, M.D.  [facility]  [place] office [phone]

## 2018-08-09 NOTE — Progress Notes (Signed)
Overnight, pt had 2x loose BM. Will endorse.

## 2018-08-09 NOTE — Progress Notes (Signed)
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 Dehydration/abnormal electrolytes working   3. htn off meds  Been up but is in pain so  4. Asthma noton contrller   5. Hx of bells paulsy   6. Recent abn lft resolved  7. Hx of multiple abscesses  8. Urine pos for opiates he denies        Subjective:   Feels better stomach wise feels like eating better    ROS:   ROS  No headache  No rash    Objective:   Physical Exam:     Visit Vitals  BP 144/90 (BP Patient Position: Supine)   Pulse 89   Temp 98.4 ??F (36.9 ??C)   Resp 16   Ht 5\' 7"  (1.702 m)   Wt 72.3 kg (159 lb 6.3 oz)   SpO2 100%   BMI 24.96 kg/m??      O2 Device: Room air    Temp (24hrs), Avg:98.3 ??F (36.8 ??C), Min:97.8 ??F (36.6 ??C), Max:98.8 ??F (37.1 ??C)    No intake/output data recorded.   06/12 1901 - 06/14 0700  In: 2936.3 [P.O.:240; I.V.:2696.3]  Out: -     Physical Exam  Constitutional:       General: He is awake.      Appearance: He is underweight. He is ill-appearing.   HENT:      Head: Normocephalic and atraumatic.   Eyes:      General: No scleral icterus.  Neck:      Musculoskeletal: Neck supple.   Cardiovascular:      Rate and Rhythm: Normal rate and regular rhythm.   Pulmonary:      Effort: Pulmonary effort is normal. No respiratory distress.      Breath sounds: No stridor. No wheezing, rhonchi or rales.   Chest:      Chest wall: No tenderness.   Abdominal:      General: Bowel sounds are normal. There is no distension.      Tenderness: There is abdominal tenderness. There is no guarding.   Skin:      Coloration: Skin is not jaundiced.      Findings: No bruising.   Neurological:      Mental Status: He is alert and oriented to person, place, and time.   Psychiatric:         Mood and Affect: Mood normal.         Behavior: Behavior normal. Behavior is cooperative.         Thought Content: Thought content normal.         Judgment: Judgment normal.       Data Review:       24 Hour Results:  Recent Results (from the past 24 hour(s))   HIV 1/2 AG/AB, 4TH GENERATION,W RFLX CONFIRM    Collection Time: 08/08/18 10:42 AM   Result  Value Ref Range    HIV Rapid NONREACTIVE NONREACTIVE      HIV P24 ANTIGEN, RAPID REACTIVE (A) NONREACTIVE         Problem List:  Problem List as of 08/09/2018 Date Reviewed: 05/03/2014          Codes Class Noted - Resolved    Dehydration ICD-10-CM: E86.0  ICD-9-CM: 276.51  08/06/2018 - Present        Enterocolitis ICD-10-CM: K52.9  ICD-9-CM: 558.9  08/06/2018 - Present        Intractable nausea and vomiting ICD-10-CM: R11.2  ICD-9-CM: 536.2  08/06/2018 - Present        AKI (acute kidney injury) (HCC) ICD-10-CM: N17.9  ICD-9-CM: 584.9  07/22/2018 - Present               Medications reviewed  Current Facility-Administered Medications   Medication Dose Route Frequency   ??? nitazoxanide (ALINIA) tablet 1,000 mg  1,000 mg Oral BID WITH MEALS   ??? albuterol (PROVENTIL VENTOLIN) nebulizer solution 2.5 mg  2.5 mg Nebulization Q4H PRN   ??? dicyclomine (BENTYL) capsule 10 mg  10 mg Oral QID   ??? acetaminophen (TYLENOL) tablet 650 mg  650 mg Oral Q4H PRN    Or   ??? acetaminophen (TYLENOL) solution 650 mg  650 mg Oral Q4H PRN    Or   ??? acetaminophen (TYLENOL) suppository 650 mg  650 mg Rectal Q4H PRN   ??? naloxone (NARCAN) injection 0.1 mg  0.1 mg IntraVENous PRN   ??? dextrose 5% - 0.45% NaCl with KCl 20 mEq/L infusion  75 mL/hr IntraVENous CONTINUOUS   ??? morphine injection 2 mg  2 mg IntraVENous Q4H PRN   ??? ondansetron (ZOFRAN) injection 4 mg  4 mg IntraVENous Q4H PRN        No Known Allergies     Care Plan discussed with: Patient/Family, Nurse and Consultant gi    Total time spent with patient: 37 minutes.    Omar PersonJulius Mannie Wineland, MD  August 09, 2018  7:33 AM

## 2018-08-09 NOTE — Consults (Signed)
Consults by Alfonso EllisMooney,  Nedim Oki, MD at 08/09/18 1211                Author: Alfonso EllisMooney, Ericah Scotto, MD  Service: Infectious Disease  Author Type: Physician       Filed: 08/10/18 0947  Date of Service: 08/09/18 1211  Status: Addendum          Editor: Alfonso EllisMooney, Evalyn Shultis, MD (Physician)          Related Notes: Original Note by Alfonso EllisMooney, Tallula Grindle, MD (Physician) filed at 08/09/18 1216                                                                                       INFECTIOUS DISEASE PROGRESS    NOTE          Requested by: Dr  [name]      Reason for consult: chronic diarrhea      Date of admission: 08/06/2018      Date of consult: August 09, 2018           ABX:           Current abx  Prior abx         nitazoxinide 6/13  1            ASSESSMENT:            cryptosporidia/ Giardia concomitant infection    source unknown        HIV- new diagnosis        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        neutropenia with adequate anc                         nkda                RECOMMENDATIONS:        1- micro  repeated the testing on the stool that resulted in BOTH + for giardia AND crypto- possible- with exposure to contaminated source water,  Again +   HIV testing + HIV An, pcr, cd4 IP, genomic testing for resistance ordered      Rx  With nitazoxanide 1000 mg po bid x 3 d, then, if improved,may  decrease  to 500 mg po bid to complete  x 14 - 28 d   Monitor stool count   HIV AN +   CD4, VL IP   Will obtain genomic testing - for appropriate HAART  determination    HIV baseline labs- after review of [facility] w/u- to avoid duplication    update vaccines when CD4 adequate- when out pt with other ID provider   Pt will need to be set up with long term provider for management as outpt for this- DW pt in detail                           MICROBIOLOGY:           5/27  Neg c diff   6/2    Stool culture neg   6/12  + for BOTH giardia and cryptosporidia--  lab repeated test with same results     LINES AND CATHETERS:     PIV        CC     Still  significant # stools. At least 5/24h.   No great improvement as yet        Past medical history:          Past Medical History:        Diagnosis  Date         ?  Asthma       ?  Hypertension       ?  Neurological disorder            Bell's palsy           History reviewed. No pertinent surgical history.                  Current Medications:          Current Facility-Administered Medications          Medication  Dose  Route  Frequency           ?  nitazoxanide (ALINIA) tablet 1,000 mg   1,000 mg  Oral  BID WITH MEALS     ?  albuterol (PROVENTIL VENTOLIN) nebulizer solution 2.5 mg   2.5 mg  Nebulization  Q4H PRN     ?  dicyclomine (BENTYL) capsule 10 mg   10 mg  Oral  QID     ?  acetaminophen (TYLENOL) tablet 650 mg   650 mg  Oral  Q4H PRN          Or           ?  acetaminophen (TYLENOL) solution 650 mg   650 mg  Oral  Q4H PRN          Or           ?  acetaminophen (TYLENOL) suppository 650 mg   650 mg  Rectal  Q4H PRN     ?  naloxone (NARCAN) injection 0.1 mg   0.1 mg  IntraVENous  PRN     ?  dextrose 5% - 0.45% NaCl with KCl 20 mEq/L infusion   75 mL/hr  IntraVENous  CONTINUOUS     ?  morphine injection 2 mg   2 mg  IntraVENous  Q4H PRN           ?  ondansetron (ZOFRAN) injection 4 mg   4 mg  IntraVENous  Q4H PRN              Physical Exam:   Vitals   Temp (24hrs), Avg:98.6 ??F (37 ??C), Min:98.4 ??F (36.9 ??C), Max:98.8 ??F (37.1 ??C)      Visit Vitals      BP  137/68 (BP Patient Position: Supine)     Pulse  75     Temp  98.6 ??F (37 ??C)     Resp  16     Ht  5\' 7"  (1.702 m)     Wt  72.3 kg (159 lb 6.3 oz)     SpO2  100%        BMI  24.96 kg/m??           General: slender, 28 y.o. year-old, male , in no acute distress   HEENT: Normocephalic, anicteric sclerae, Pupils equal, round reactive to light, no oropharyngeal lesions. No sinus tenderness. Dentition in mediocre repair   Neck: Supple, no lymphadenopathy, masses or thyromegaly  Chest: Symmetrical expansion   Lungs: Clear to auscultation bilaterally, no dullness   Heart:  Regular rhythm, no murmur, no rub or gallop, No JVD   Abdomen: Soft, some TTP, no rebound.slightly distended, no organomegaly, BS+   Musculoskeletal: Normal strength/tone. No edema. No clubbing or cyanosis   CNS: AAOx3. Cranial nerves II-XII intact. Grossly normal.No NR   SKIN: No skin lesion or rash. Dry, warm, intact                 Labs:  Results:        Chemistry  Recent Labs         08/07/18   0259  08/06/18   1229      GLU  118*   --       NA  134*   --       K  3.7   --       CL  107   --       CO2  18*  11.0*      BUN  48*   --       CREA  1.7*   --       CA  8.3*   --       AGAP  8   --       AP  107   --       TP  8.6*   --       ALB  3.4   --                  CBC w/Diff  Recent Labs         08/07/18   0259      WBC  2.9*      RBC  5.42      HGB  15.4      HCT  44.3      PLT  141      GRANS  54.5      LYMPH  31.8      EOS  0.0                 Microbiology  No results for input(s): CULT in the last 72 hours.           Imaging-        Results from Hospital Encounter encounter on 07/21/18     XR ABD (AP AND ERECT OR DECUB)           Narrative  Clinical history: Abdominal pain, ileus      EXAMINATION:   Supine and upright projections of the abdomen 07/30/2018      FINDINGS:   Air is noted in the ascending, transverse and portions of the descending colon   and rectosigmoid. No dilated loops of small or bowel. Multiple air-fluid levels   on the upright projection.              Impression  IMPRESSION:   Ileus.                Results from Hospital Encounter encounter on 08/06/18     CT ABD PELV WO CONT           Narrative  CT Abdomen and Pelvis without      Indication: Abdominal pain. Nausea, vomiting, diarrhea.      Comparison: 07/22/2018.      TECHNIQUE:    CT of the abdomen and pelvis WITHOUT intravenous contrast. Coronal and  sagittal   reformations were obtained. Oral contrast was given.      All CT exams at this facility use one or more dose reduction techniques   including automatic exposure control, mA/kV  adjustment per patient's size, or   iterative reconstruction technique. DICOM format imaged data is available to   non-affiliated external healthcare facilities or entities on a secure, media   free, reciprocal searchable basis with patient authorization for 12 months   following the date of the study.      DISCUSSION:      ABSENCE OF INTRAVENOUS CONTRAST DECREASES SENSITIVITY FOR DETECTION OF FOCAL   LESIONS AND VASCULAR PATHOLOGY.      LOWER THORAX: Normal.      HEPATOBILIARY: No focal hepatic lesions.  No biliary ductal dilatation.   SPLEEN: No splenomegaly.   PANCREAS: No focal masses or ductal dilatation.      ADRENALS: No adrenal nodules.   KIDNEYS/URETERS: No hydronephrosis, stones, or solid mass lesions.   PELVIC ORGANS/BLADDER: Unremarkable.      PERITONEUM / RETROPERITONEUM: No free air or fluid.   LYMPH NODES: No lymphadenopathy.   VESSELS: Unremarkable.      GI TRACT: Multiple nondistended fluid-filled loops of small bowel with   questionable mild wall thickening. Fluid-filled colon with no significant wall   thickening. No obstruction. Normal appendix.      BONES AND SOFT TISSUES: No acute abnormality.              Impression  IMPRESSION:      Numerous fluid-filled loops of small and large bowel with borderline diffuse   small bowel wall thickening, concerning for infectious or inflammatory   enterocolitis. Findings have slightly progressed since 07/22/2018.              ---------------------------------------------------------------------------------------------------------------   I have independently examined the patient and reviewed all lab studies and imgaing as well as review of nursing notes and physican notes          Jearld Adjutant, MD   08/09/2018      [facility]   [phone]

## 2018-08-09 NOTE — Progress Notes (Signed)
Progress Notes by Franky Machoicketts, Jaxsen Bernhart A, MD at 08/09/18 [phone]                Author: Franky Machoicketts, Bennet Kujawa A, MD  Service: Gastroenterology  Author Type: Physician       Filed: 08/09/18 0958  Date of Service: 08/09/18 0944  Status: Addendum          Editor: Franky Machoicketts, Camerin Ladouceur A, MD (Physician)          Related Notes: Original Note by Franky Machoicketts, Maythe Deramo A, MD (Physician) filed at 08/09/18 0957                       A member of [facility]            GI Progress Note      Admit Date: 08/06/2018        Assessment:     1.  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    2.  HIV + - new diagnosis.     3.  Elevated LFTS--Liver unremarkable on CT imaging. Viral panel neg??   4.  AKI    5.  Co-morbidities - HTN, Asthma, Bell's palsy        Plan:     ??  Continue nitazoxanide bid for 2-4 weeks.  Appreciate ID recs   ??  HIV PCR/CD4 levels, and HIV resistance panel  pending   ??  Will need close follow up with Dr. Edwin DadaKapoor and ID as outpatient   ??  No plan for endoscopic evaluation      Will be available as needed.  Please call if questions.      Dalia HeadingPaul Sylas Twombly, M.D.    [facility]    [place] Office: [phone]         Subjective:        Still with diarrhea 2-3 episodes, nonbloody.  +abd discomfort/fullness. No vomiting.           Objective:     Physical Exam:   Visit Vitals      BP  144/90 (BP Patient Position: Supine)     Pulse  89     Temp  98.4 ??F (36.9 ??C)     Resp  16     Ht  5\' 7"  (1.702 m)     Wt  72.3 kg (159 lb 6.3 oz)     SpO2  100%        BMI  24.96 kg/m??              Gen: aaox3, NAD, gaunt   CV: RRR no m   Pulm: CTA bilat   Abd: soft, NT, ND +BS    Ext: no LE edema bilat        Current Facility-Administered Medications          Medication  Dose  Route  Frequency           ?  nitazoxanide (ALINIA) tablet 1,000 mg   1,000 mg  Oral  BID WITH MEALS     ?  albuterol (PROVENTIL VENTOLIN) nebulizer solution 2.5 mg   2.5 mg  Nebulization  Q4H PRN     ?  dicyclomine  (BENTYL) capsule 10 mg   10 mg  Oral  QID     ?  acetaminophen (TYLENOL) tablet 650 mg   650 mg  Oral  Q4H PRN          Or           ?  acetaminophen (TYLENOL) solution 650 mg   650 mg  Oral  Q4H PRN          Or           ?  acetaminophen (TYLENOL) suppository 650 mg   650 mg  Rectal  Q4H PRN     ?  naloxone (NARCAN) injection 0.1 mg   0.1 mg  IntraVENous  PRN     ?  dextrose 5% - 0.45% NaCl with KCl 20 mEq/L infusion   75 mL/hr  IntraVENous  CONTINUOUS     ?  morphine injection 2 mg   2 mg  IntraVENous  Q4H PRN           ?  ondansetron (ZOFRAN) injection 4 mg   4 mg  IntraVENous  Q4H PRN           Labs        CBC w/Diff     Recent Labs         08/07/18   0259  08/06/18   1114      WBC  2.9*  4.8      HGB  15.4  18.0*      HCT  44.3  51.0*      MCV  81.7  79.8*      PLT  141  152                  Hepatic Function     Recent Labs         08/07/18   0259  08/06/18   1114      ALT  72  82*      AP  107  129*      TBILI  0.9  1.0      ALB  3.4  4.3      TP  8.6*  10.2*                  Pancreatic Enzymes     Recent Labs         08/06/18   1114      LPSE  178                  Basic Metabolic Profile     Recent Labs         08/07/18   0259  08/06/18   1229  08/06/18   1114      NA  134*   --   132*      K  3.7   --   3.1*      CL  107   --   104      CO2  18*  11.0*  13*      BUN  48*   --   75*      GLU  118*   --   121*      CREA  1.7*   --   2.1*      CA  8.3*   --   9.0                  Coagulation       No results for input(s): PTP, INR, APTT, INREXT in the last 72 hours.              Bea Graff, M.D.   [facility] [place] office [phone]

## 2018-08-09 NOTE — Progress Notes (Signed)
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 Dehydration/abnormal electrolytes working   3. htn off meds  Been up but is in pain so  4. Asthma noton contrller   5. Hx of bells paulsy   6. Recent abn lft resolved  7. Hx of multiple abscesses  8. Urine pos for opiates he denies        Subjective:   Feels better stomach wise feels like eating better    ROS:   ROS  No headache  No rash    Objective:   Physical Exam:     Visit Vitals  BP 144/90 (BP Patient Position: Supine)   Pulse 89   Temp 98.4 ??F (36.9 ??C)   Resp 16   Ht 5\' 7"  (1.702 m)   Wt 72.3 kg (159 lb 6.3 oz)   SpO2 100%   BMI 24.96 kg/m??      O2 Device: Room air    Temp (24hrs), Avg:98.3 ??F (36.8 ??C), Min:97.8 ??F (36.6 ??C), Max:98.8 ??F (37.1 ??C)    No intake/output data recorded.   06/12 1901 - 06/14 0700  In: 2936.3 [P.O.:240; I.V.:2696.3]  Out: -     Physical Exam  Constitutional:       General: He is awake.      Appearance: He is underweight. He is ill-appearing.   HENT:      Head: Normocephalic and atraumatic.   Eyes:      General: No scleral icterus.  Neck:      Musculoskeletal: Neck supple.   Cardiovascular:      Rate and Rhythm: Normal rate and regular rhythm.   Pulmonary:      Effort: Pulmonary effort is normal. No respiratory distress.      Breath sounds: No stridor. No wheezing, rhonchi or rales.   Chest:      Chest wall: No tenderness.   Abdominal:      General: Bowel sounds are normal. There is no distension.      Tenderness: There is abdominal tenderness. There is no guarding.   Skin:     Coloration: Skin is not  jaundiced.      Findings: No bruising.   Neurological:      Mental Status: He is alert and oriented to person, place, and time.   Psychiatric:         Mood and Affect: Mood normal.         Behavior: Behavior normal. Behavior is cooperative.         Thought Content: Thought content normal.         Judgment: Judgment normal.       Data Review:       24 Hour Results:  Recent Results (from the past 24 hour(s))   HIV 1/2 AG/AB, 4TH GENERATION,W RFLX CONFIRM    Collection Time: 08/08/18 10:42 AM   Result  Value Ref Range    HIV Rapid NONREACTIVE NONREACTIVE      HIV P24 ANTIGEN, RAPID REACTIVE (A) NONREACTIVE         Problem List:  Problem List as of 08/09/2018 Date Reviewed: 13-May-2014          Codes Class Noted - Resolved    Dehydration ICD-10-CM: E86.0  ICD-9-CM: 276.51  08/06/2018 - Present        Enterocolitis ICD-10-CM: K52.9  ICD-9-CM: 558.9  08/06/2018 - Present        Intractable nausea and vomiting ICD-10-CM: R11.2  ICD-9-CM: 536.2  08/06/2018 - Present        AKI (acute kidney injury) ([place]) ICD-10-CM: N17.9  ICD-9-CM: 584.9  07/22/2018 - Present               Medications reviewed  Current Facility-Administered Medications   Medication Dose Route Frequency   ??? nitazoxanide (ALINIA) tablet 1,000 mg  1,000 mg Oral BID WITH MEALS   ??? albuterol (PROVENTIL VENTOLIN) nebulizer solution 2.5 mg  2.5 mg Nebulization Q4H PRN   ??? dicyclomine (BENTYL) capsule 10 mg  10 mg Oral QID   ??? acetaminophen (TYLENOL) tablet 650 mg  650 mg Oral Q4H PRN    Or   ??? acetaminophen (TYLENOL) solution 650 mg  650 mg Oral Q4H PRN    Or   ??? acetaminophen (TYLENOL) suppository 650 mg  650 mg Rectal Q4H PRN   ??? naloxone (NARCAN) injection 0.1 mg  0.1 mg IntraVENous PRN   ??? dextrose 5% - 0.45% NaCl with KCl 20 mEq/L infusion  75 mL/hr IntraVENous CONTINUOUS   ??? morphine injection 2 mg  2 mg IntraVENous Q4H PRN   ??? ondansetron (ZOFRAN) injection 4 mg  4 mg IntraVENous Q4H PRN        No Known Allergies    Care Plan discussed with: Patient/Family, Nurse  and Consultant gi    Total time spent with patient: 37 minutes.    Peter Minium, MD  August 09, 2018  7:33 AM

## 2018-08-09 NOTE — Progress Notes (Signed)
Problem: Falls - Risk of  Goal: *Absence of Falls  Description: Document Travis [name] Fall Risk and appropriate interventions in the flowsheet.  Outcome: Progressing Towards Goal  Note: Fall Risk Interventions:  Mobility Interventions: Bed/chair exit alarm, Patient to call before getting OOB         Medication Interventions: Bed/chair exit alarm, Patient to call before getting OOB    Elimination Interventions: Call light in reach, Bed/chair exit alarm, Toilet paper/wipes in reach

## 2018-08-09 NOTE — Progress Notes (Signed)
Overnight, pt had 2x loose BM. Will endorse.

## 2018-08-09 NOTE — Progress Notes (Signed)
Progress  Notes by Alfonso EllisMooney, Ifeoluwa Bartz, MD at 08/09/18 1012                Author: Alfonso EllisMooney, Mckaylin Bastien, MD  Service: Infectious Disease  Author Type: Physician       Filed: 08/10/18 0947  Date of Service: 08/09/18 1012  Status: Signed          Editor: Alfonso EllisMooney, Romanita Fager, MD (Physician)                                                                                       INFECTIOUS DISEASE PROGRESS NOTE          Requested by: Dr  [name]      Reason for consult: chronic diarrhea      Date of admission: 08/06/2018              ABX:           Current abx  Prior abx         nitazoxinide 6/13  1            ASSESSMENT:            cryptosporidia/ Giardia concomitant infection    source unknown        HIV- new diagnosis        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        neutropenia with adequate anc                         nkda                RECOMMENDATIONS:        1- I asked micro to repeat the testing on the stool that resulted in BOTH + for giardia AND crypto- possible- with exposure to contaminated source water,  but suspicious for error   HIV testing in place and still pending-    Stool for O&P may pick up giardia as well   If both still (+), will send out for multiplex pcr on stool   Rx  With nitazoxanide 1000 mg po bid x 3 d, then, if improved,may  decrease  tp 500 mg po bid to complete  x 14 - 28 d   Monitor stool count   HIV AN +   CD4, VL IP   Will obtain genomic testing - for appropriate HAART  determination    HIV baseline labs- after review of [facility] w/u- to avoid duplication    update vaccines when CD4 adequate   Pt will need to be set up with long term provider for management as outpt for this                           MICROBIOLOGY:           5/27  Neg c diff   6/2    Stool culture neg   6/12  + for BOTH giardia and cryptosporidia-- lab repeated test with same results     LINES AND CATHETERS:     PIV  HPI:     28 yo XY with chronic diarrhea x "18 days" per pt has been admitted to [facility] and  worked up by IM  and  GI on several occassions and was re-admitted on 6/11 with RLQ  abdominal pain, N/V and nonbloody diarrhea and 25lbs weight loss x 1 month. WBC count was borderline low. He has enteritis on CT, ddx infectious vs IBD, however it has progressed over the past 2 weeks. GI consultant checked   stool for less common pathogens  and  empirically with IV steroids for IBS to assess clinical response    C diff eval and stool culture neg in past.  Stool for giardia and cryptosporidia AG from 6/12 were positive. HIV test was ordered ad returns with HIV An +, pcr IP.      Pt is MSM.  Has syphilis in march of this year and had std wy with penicillin shot q w x 3 at [facility].  Extensive testing took place , pt uncertain which tests were actually done.          Past medical history:          Past Medical History:        Diagnosis  Date         ?  Asthma       ?  Hypertension       ?  Neurological disorder            Bell's palsy           History reviewed. No pertinent surgical history.         Social History:          Social History          Socioeconomic History         ?  Marital status:  SINGLE              Spouse name:  Not on file         ?  Number of children:  Not on file     ?  Years of education:  Not on file     ?  Highest education level:  Not on file       Occupational History        ?  Not on file       Social Needs         ?  Financial resource strain:  Not on file        ?  Food insecurity              Worry:  Not on file         Inability:  Not on file        ?  Transportation needs              Medical:  Not on file         Non-medical:  Not on file       Tobacco Use         ?  Smoking status:  Never Smoker     ?  Smokeless tobacco:  Never Used       Substance and Sexual Activity         ?  Alcohol use:  No     ?  Drug use:  No     ?  Sexual activity:  Yes              Partners:  Female         Birth control/protection:  Condom       Lifestyle        ?  Physical activity              Days per week:  Not on file          Minutes per session:  Not on file         ?  Stress:  Not on file       Relationships        ?  Social Engineer, manufacturing systems on phone:  Not on file         Gets together:  Not on file         Attends religious service:  Not on file         Active member of club or organization:  Not on file         Attends meetings of clubs or organizations:  Not on file         Relationship status:  Not on file        ?  Intimate partner violence              Fear of current or ex partner:  Not on file         Emotionally abused:  Not on file         Physically abused:  Not on file         Forced sexual activity:  Not on file        Other Topics  Concern        ?  Not on file       Social History Narrative        ?  Not on file             Family History:     History reviewed. No pertinent family history.        Allergies:     No Known Allergies           Home Medications:          Medications Prior to Admission        Medication  Sig         ?  omeprazole (PRILOSEC) 40 mg capsule  Take 1 Cap by mouth Daily (before breakfast).         ?  albuterol (PROAIR HFA) 90 mcg/actuation inhaler  Take 1 Puff by inhalation every four (4) hours as needed for Wheezing.             Current Medications:          Current Facility-Administered Medications          Medication  Dose  Route  Frequency           ?  nitazoxanide (ALINIA) tablet 1,000 mg   1,000 mg  Oral  BID WITH MEALS     ?  albuterol (PROVENTIL VENTOLIN) nebulizer solution 2.5 mg   2.5 mg  Nebulization  Q4H PRN     ?  dicyclomine (BENTYL) capsule 10 mg   10 mg  Oral  QID     ?  acetaminophen (TYLENOL) tablet 650 mg   650 mg  Oral  Q4H PRN          Or           ?  acetaminophen (TYLENOL) solution 650 mg   650 mg  Oral  Q4H PRN          Or           ?  acetaminophen (TYLENOL) suppository 650 mg   650 mg  Rectal  Q4H PRN     ?  naloxone (NARCAN) injection 0.1 mg   0.1 mg  IntraVENous  PRN     ?  dextrose 5% - 0.45% NaCl with KCl 20 mEq/L infusion   75 mL/hr  IntraVENous   CONTINUOUS     ?  morphine injection 2 mg   2 mg  IntraVENous  Q4H PRN           ?  ondansetron (ZOFRAN) injection 4 mg   4 mg  IntraVENous  Q4H PRN             Review of Systems:     12 points ROS done. Pertinent positives and negatives are as follows, ROS otherwise negative.      Constitutional: Positive for malaise/fatigue. Negative for chills and  fever.    HENT: Negative for congestion, ear pain, sinus pain and sore throat.     Eyes: Negative for blurred vision and photophobia.    Respiratory: Negative for cough, shortness of breath and wheezing.     Cardiovascular: Negative for chest pain, palpitations and leg swelling.    Gastrointestinal: Positive for abdominal pain, diarrhea , nausea and vomiting. Negative  for constipation and heartburn.    Genitourinary: Negative for dysuria and flank pain.    Musculoskeletal: Negative for myalgias.    Skin: Negative for itching and rash.    Neurological: Positive for weakness. Negative for dizziness and headaches.    Endo/Heme/Allergies: Negative for polydipsia.    Psychiatric/Behavioral: Negative for depression and suicidal ideas. The patient is not nervous/anxious and does not have insomnia.           Physical Exam:   Vitals   Temp (24hrs), Avg:98.4 ??F (36.9 ??C), Min:97.9 ??F (36.6 ??C), Max:98.8 ??F (37.1 ??C)      Visit Vitals      BP  144/90 (BP Patient Position: Supine)     Pulse  89     Temp  98.4 ??F (36.9 ??C)     Resp  16     Ht   (1.702 m)     Wt  72.3 kg (159 lb 6.3 oz)     SpO2  100%        BMI  24.96 kg/m??           General: slender, 28 y.o. year-old, male , in no acute distress   HEENT: Normocephalic, anicteric sclerae, Pupils equal, round reactive to light, no oropharyngeal lesions. No sinus tenderness. Dentition in mediocre repair   Neck: Supple, no lymphadenopathy, masses or thyromegaly   Chest: Symmetrical expansion   Lungs: Clear to auscultation bilaterally, no dullness   Heart: Regular rhythm, no murmur, no rub or gallop, No JVD   Abdomen: Soft, some  TTP, no rebound.slightly distended, no organomegaly, BS+   Musculoskeletal: Normal strength/tone. No edema. No clubbing or cyanosis   CNS: AAOx3. Cranial nerves II-XII intact. Grossly normal.No NR   SKIN: No skin lesion or rash. Dry, warm, intact                 Labs:  Results:        Chemistry  Recent Labs         08/07/18  0259  08/06/18   1229  08/06/18   1114      GLU  118*   --   121*      NA  134*   --   132*      K  3.7   --   3.1*      CL  107   --   104      CO2  18*  11.0*  13*      BUN  48*   --   75*      CREA  1.7*   --   2.1*      CA  8.3*   --   9.0      AGAP  8   --   15      AP  107   --   129*      TP  8.6*   --   10.2*      ALB  3.4   --   4.3                 CBC w/Diff  Recent Labs         08/07/18   0259  08/06/18   1114      WBC  2.9*  4.8      RBC  5.42  6.39*      HGB  15.4  18.0*      HCT  44.3  51.0*      PLT  141  152      GRANS  54.5  63.9      LYMPH  31.8  16.8*      EOS  0.0  0.4                 Microbiology  No results for input(s): CULT in the last 72 hours.           Imaging-        Results from Hospital Encounter encounter on 07/21/18     XR ABD (AP AND ERECT OR DECUB)           Narrative  Clinical history: Abdominal pain, ileus      EXAMINATION:   Supine and upright projections of the abdomen 07/30/2018      FINDINGS:   Air is noted in the ascending, transverse and portions of the descending colon   and rectosigmoid. No dilated loops of small or bowel. Multiple air-fluid levels   on the upright projection.              Impression  IMPRESSION:   Ileus.                Results from Hospital Encounter encounter on 08/06/18     CT ABD PELV WO CONT           Narrative  CT Abdomen and Pelvis without      Indication: Abdominal pain. Nausea, vomiting, diarrhea.      Comparison: 07/22/2018.      TECHNIQUE:    CT of the abdomen and pelvis WITHOUT intravenous contrast. Coronal and sagittal   reformations were obtained. Oral contrast was given.      All CT exams at this facility use one or more  dose reduction techniques   including automatic exposure control, mA/kV adjustment per patient's size, or   iterative reconstruction technique. DICOM format imaged data is available to   non-affiliated external healthcare facilities or entities on  a secure, media   free, reciprocal searchable basis with patient authorization for 12 months   following the date of the study.      DISCUSSION:      ABSENCE OF INTRAVENOUS CONTRAST DECREASES SENSITIVITY FOR DETECTION OF FOCAL   LESIONS AND VASCULAR PATHOLOGY.      LOWER THORAX: Normal.      HEPATOBILIARY: No focal hepatic lesions.  No biliary ductal dilatation.   SPLEEN: No splenomegaly.   PANCREAS: No focal masses or ductal dilatation.      ADRENALS: No adrenal nodules.   KIDNEYS/URETERS: No hydronephrosis, stones, or solid mass lesions.   PELVIC ORGANS/BLADDER: Unremarkable.      PERITONEUM / RETROPERITONEUM: No free air or fluid.   LYMPH NODES: No lymphadenopathy.   VESSELS: Unremarkable.      GI TRACT: Multiple nondistended fluid-filled loops of small bowel with   questionable mild wall thickening. Fluid-filled colon with no significant wall   thickening. No obstruction. Normal appendix.      BONES AND SOFT TISSUES: No acute abnormality.              Impression  IMPRESSION:      Numerous fluid-filled loops of small and large bowel with borderline diffuse   small bowel wall thickening, concerning for infectious or inflammatory   enterocolitis. Findings have slightly progressed since 07/22/2018.              ---------------------------------------------------------------------------------------------------------------   I have independently examined the patient and reviewed all lab studies and imgaing as well as review of nursing notes and physican notes          Alfonso EllisMartha Capricia Serda, MD   08/09/2018      [facility]   [phone]

## 2018-08-09 NOTE — Progress Notes (Signed)
Pt had 3 x BM today during day shift. Will endorse to night shift nurse.

## 2018-08-09 NOTE — Progress Notes (Signed)
Problem: Falls - Risk of  Goal: *Absence of Falls  Description: Document Travis [name] Fall Risk and appropriate interventions in the flowsheet.  Outcome: Progressing Towards Goal  Note: Fall Risk Interventions:  Mobility Interventions: Bed/chair exit alarm         Medication Interventions: Bed/chair exit alarm    Elimination Interventions: Call light in reach, Bed/chair exit alarm, Urinal in reach, Toilet paper/wipes in reach, Patient to call for help with toileting needs

## 2018-08-10 LAB — CBC WITH AUTO DIFFERENTIAL
Basophils %: 0.4 % (ref 0–3)
Eosinophils %: 2.5 % (ref 0–5)
Hematocrit: 36.4 % — ABNORMAL LOW (ref 37.0–50.0)
Hemoglobin: 12.9 gm/dl (ref 12.4–17.2)
Immature Granulocytes: 0.4 % (ref 0.0–3.0)
Lymphocytes %: 11.9 % — ABNORMAL LOW (ref 28–48)
MCH: 28.3 pg (ref 23.0–34.6)
MCHC: 35.4 gm/dl (ref 30.0–36.0)
MCV: 79.8 fL — ABNORMAL LOW (ref 80.0–98.0)
MPV: 13.8 fL — ABNORMAL HIGH (ref 6.0–10.0)
Monocytes %: 16.6 % — ABNORMAL HIGH (ref 1–13)
Neutrophils %: 68.2 % — ABNORMAL HIGH (ref 34–64)
Nucleated RBCs: 0 (ref 0–0)
Platelets: 122 10*3/uL — ABNORMAL LOW (ref 140–450)
RBC: 4.56 M/uL (ref 3.80–5.70)
RDW-SD: 38.5 (ref 35.1–43.9)
WBC: 4.7 10*3/uL (ref 4.0–11.0)

## 2018-08-10 LAB — CBC WITH AUTOMATED DIFF
BASOPHILS: 0.4 % (ref 0–3)
EOSINOPHILS: 2.5 % (ref 0–5)
HCT: 36.4 % — ABNORMAL LOW (ref 37.0–50.0)
HGB: 12.9 gm/dl (ref 12.4–17.2)
IMMATURE GRANULOCYTES: 0.4 % (ref 0.0–3.0)
LYMPHOCYTES: 11.9 % — ABNORMAL LOW (ref 28–48)
MCH: 28.3 pg (ref 23.0–34.6)
MCHC: 35.4 gm/dl (ref 30.0–36.0)
MCV: 79.8 fL — ABNORMAL LOW (ref 80.0–98.0)
MONOCYTES: 16.6 % — ABNORMAL HIGH (ref 1–13)
MPV: 13.8 fL — ABNORMAL HIGH (ref 6.0–10.0)
NEUTROPHILS: 68.2 % — ABNORMAL HIGH (ref 34–64)
NRBC: 0 (ref 0–0)
PLATELET: 122 10*3/uL — ABNORMAL LOW (ref 140–450)
RBC: 4.56 M/uL (ref 3.80–5.70)
RDW-SD: 38.5 (ref 35.1–43.9)
WBC: 4.7 10*3/uL (ref 4.0–11.0)

## 2018-08-10 MED FILL — ALINIA 500 MG TABLET: 500 mg | ORAL | Qty: 2

## 2018-08-10 MED FILL — DICYCLOMINE 10 MG CAP: 10 mg | ORAL | Qty: 1

## 2018-08-10 NOTE — Consults (Signed)
INFECTIOUS DISEASE PROGRESS   NOTE       Requested by: Dr  Walden Field    Reason for consult: chronic diarrhea    Date of admission: 08/06/2018    Date of consult: August 10, 2018      ABX:     Current abx Prior abx    nitazoxinide 6/13  2      ASSESSMENT:       cryptosporidia/ Giardia concomitant infection   source unknown    HIV- new diagnosis    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    neutropenia with adequate anc             nkda         RECOMMENDATIONS:     1- micro  repeated the testing on the stool that resulted in BOTH + for giardia AND crypto- possible- with exposure to contaminated source water,  Again +  HIV testing + HIV An, pcr, cd4 IP, genomic testing for resistance ordered    Rx  With nitazoxanide 1000 mg po bid x 3 d, then, if improved,may  decrease  to 500 mg po bid to complete  x 14 - 28 d  Monitor stool count  HIV AN +  CD4, VL IP  Will obtain genomic testing - for appropriate HAART  determination   HIV baseline labs- after review of [facility] w/u- to avoid duplication   update vaccines when CD4 adequate- when out pt with other ID provider  Pt will need to be set up with long term provider for management as outpt for this- DW pt in detail    He is to see [facility] as new pt for his HIV management and [facility] can folow him on the nitazoxinide- he will need 2-4 weeks    Would like to have the presscrition filled for 1000 mg bid to complete 14 d prior to leaving [facility]- as outside pharmacies may not carry    DW Dr Ninetta Lights                     MICROBIOLOGY:       5/27  Neg c diff  6/2    Stool culture neg  6/12  + for BOTH giardia and cryptosporidia-- lab repeated test with same results  LINES AND CATHETERS:   PIV    CC   Still significant # stools. A bit better.  Eating. No other co    Past medical history:     Past Medical History:   Diagnosis Date   ??? Asthma    ??? Hypertension    ??? Neurological disorder     Bell's palsy        History reviewed. No pertinent surgical history.           Current Medications:     Current Facility-Administered Medications   Medication Dose Route Frequency   ??? nitazoxanide (ALINIA) tablet 1,000 mg  1,000 mg Oral BID WITH MEALS   ??? albuterol (PROVENTIL VENTOLIN) nebulizer solution 2.5 mg  2.5 mg Nebulization Q4H PRN   ??? dicyclomine (BENTYL) capsule 10 mg  10 mg Oral QID   ??? acetaminophen (TYLENOL) tablet 650 mg  650 mg Oral Q4H PRN    Or   ??? acetaminophen (TYLENOL) solution 650 mg  650 mg Oral Q4H PRN    Or   ??? acetaminophen (TYLENOL) suppository 650 mg  650 mg Rectal Q4H PRN   ??? naloxone (NARCAN) injection 0.1  mg  0.1 mg IntraVENous PRN   ??? dextrose 5% - 0.45% NaCl with KCl 20 mEq/L infusion  75 mL/hr IntraVENous CONTINUOUS   ??? morphine injection 2 mg  2 mg IntraVENous Q4H PRN   ??? ondansetron (ZOFRAN) injection 4 mg  4 mg IntraVENous Q4H PRN         Physical Exam:  Vitals  Temp (24hrs), Avg:98.5 ??F (36.9 ??C), Min:98.1 ??F (36.7 ??C), Max:98.9 ??F (37.2 ??C)    Visit Vitals  BP 131/73 (BP 1 Location: Right arm, BP Patient Position: Supine)   Pulse 82   Temp 98.5 ??F (36.9 ??C)   Resp 16   Ht 5\' 7"  (1.702 m)   Wt 72.3 kg (159 lb 6.3 oz)   SpO2 100%   BMI 24.96 kg/m??       General: slender, 28 y.o. year-old, male, in no acute distress  HEENT: Normocephalic, anicteric sclerae, Pupils equal, round reactive to light, no oropharyngeal lesions. No sinus tenderness. Dentition in mediocre repair  Neck: Supple, no lymphadenopathy, masses or thyromegaly  Chest: Symmetrical expansion  Lungs: Clear to auscultation bilaterally, no dullness  Heart: Regular rhythm, no murmur, no rub or gallop, No JVD  Abdomen: Soft, some TTP, no rebound.slightly distended, no organomegaly, BS+  Musculoskeletal: Normal strength/tone. No edema. No clubbing or cyanosis  CNS: AAOx3. Cranial nerves II-XII intact. Grossly normal.No NR  SKIN: No skin lesion or rash. Dry, warm, intact          Labs: Results:    Chemistry No results for input(s): GLU, NA, K, CL, CO2, BUN, CREA, CA, AGAP, BUCR, TBIL, AP, TP, ALB, GLOB, AGRAT in the last 72 hours.    No lab exists for component: GPT   CBC w/Diff Recent Labs     08/10/18  0312 08/09/18  1253   WBC 4.7 3.1*   RBC 4.56 4.55   HGB 12.9 12.9   HCT 36.4* 36.3*   PLT 122* 113*   GRANS 68.2* 55.4   LYMPH 11.9* 20.4*   EOS 2.5 1.3      Microbiology No results for input(s): CULT in the last 72 hours.       Imaging-    Results from Hospital Encounter encounter on 07/21/18   XR ABD (AP AND ERECT OR DECUB)    Narrative Clinical history: Abdominal pain, ileus    EXAMINATION:  Supine and upright projections of the abdomen 07/30/2018    FINDINGS:  Air is noted in the ascending, transverse and portions of the descending colon  and rectosigmoid. No dilated loops of small or bowel. Multiple air-fluid levels  on the upright projection.      Impression IMPRESSION:  Ileus.         Results from Hospital Encounter encounter on 08/06/18   CT ABD PELV WO CONT    Narrative CT Abdomen and Pelvis without    Indication: Abdominal pain. Nausea, vomiting, diarrhea.    Comparison: 07/22/2018.    TECHNIQUE:   CT of the abdomen and pelvis WITHOUT intravenous contrast. Coronal and sagittal  reformations were obtained. Oral contrast was given.    All CT exams at this facility use one or more dose reduction techniques  including automatic exposure control, mA/kV adjustment per patient's size, or  iterative reconstruction technique. DICOM format imaged data is available to  non-affiliated external healthcare facilities or entities on a secure, media  free, reciprocal searchable basis with patient authorization for 12 months  following the date of the study.  DISCUSSION:    ABSENCE OF INTRAVENOUS CONTRAST DECREASES SENSITIVITY FOR DETECTION OF FOCAL  LESIONS AND VASCULAR PATHOLOGY.    LOWER THORAX: Normal.    HEPATOBILIARY: No focal hepatic lesions.  No biliary ductal dilatation.  SPLEEN: No splenomegaly.   PANCREAS: No focal masses or ductal dilatation.    ADRENALS: No adrenal nodules.  KIDNEYS/URETERS: No hydronephrosis, stones, or solid mass lesions.  PELVIC ORGANS/BLADDER: Unremarkable.    PERITONEUM / RETROPERITONEUM: No free air or fluid.  LYMPH NODES: No lymphadenopathy.  VESSELS: Unremarkable.    GI TRACT: Multiple nondistended fluid-filled loops of small bowel with  questionable mild wall thickening. Fluid-filled colon with no significant wall  thickening. No obstruction. Normal appendix.    BONES AND SOFT TISSUES: No acute abnormality.      Impression IMPRESSION:    Numerous fluid-filled loops of small and large bowel with borderline diffuse  small bowel wall thickening, concerning for infectious or inflammatory  enterocolitis. Findings have slightly progressed since 07/22/2018.         ---------------------------------------------------------------------------------------------------------------  I have independently examined the patient and reviewed all lab studies and imgaing as well as review of nursing notes and physican notes       Alfonso EllisMartha Cashmere Dingley, MD  08/10/2018    [facility]  [phone]

## 2018-08-10 NOTE — Progress Notes (Signed)
Problem: Nausea/Vomiting (Adult)  Goal: *Absence of nausea/vomiting  Outcome: Progressing Towards Goal     Problem: Patient Education: Go to Patient Education Activity  Goal: Patient/Family Education  Outcome: Progressing Towards Goal     Problem: Pain  Goal: *Control of Pain  Outcome: Progressing Towards Goal     Problem: Patient Education: Go to Patient Education Activity  Goal: Patient/Family Education  Outcome: Progressing Towards Goal     Problem: Falls - Risk of  Goal: *Absence of Falls  Description: Document Schmid Fall Risk and appropriate interventions in the flowsheet.  Outcome: Progressing Towards Goal  Note: Fall Risk Interventions:  Mobility Interventions: Bed/chair exit alarm         Medication Interventions: Assess postural VS orthostatic hypotension    Elimination Interventions: Bed/chair exit alarm              Problem: Patient Education: Go to Patient Education Activity  Goal: Patient/Family Education  Outcome: Progressing Towards Goal

## 2018-08-10 NOTE — Progress Notes (Signed)
INTERNAL MEDICINE PROGRESS NOTE    Date of note:      August 10, 2018    Patient:               Travis [name], 28 y.o., male  Admit Date:        08/06/2018  Length of Stay:  4 day(s)    Problem List:   Patient Active Problem List   Diagnosis Code   ??? AKI (acute kidney injury) ([place]) N17.9   ??? Dehydration E86.0   ??? Enterocolitis K52.9   ??? Intractable nausea and vomiting R11.2       Subjective + interval history     Had total 8 BMs over 24 hours. BMs are little more formed now. Not as watery. Still has lot of abdominal cramping and bloating after meals. Bentyl is helping somewhat.     Assessment :     Enterocolitis - stool culture positive for giardia, cryptococcus  New diagnosis of HIV  Dehydration  AKI present on admission  Metabolic acidosis  Hypertension  Asthma  History of Bell's palsy  Elevated LFTs - resolved  Moderate protein calorie malnutrition  Unintentional weight loss ~ 20 lbs in couple of months    Plan :     ID input appreciated. Nitazoxanide 1000 mg po BID x 3 days -> then decrease to 500 mg Po BID to complete 14-28 day course    New diagnosis of HIV. ID on board. HIV RNA and Genomic testing ordered    Continue [name] hydration     SCD and ambulation for DVT ppx ( borderline low platelet count, so not on heparin)    I updated his mom at length on phone [phone]      - Code status: full code    Recommend to continue hospitalization.  Expected date of discharge: tbd  Plan for disposition - tbd    Objective:       Visit Vitals  BP 141/76 (BP 1 Location: Right arm, BP Patient Position: Supine)   Pulse 93   Temp 98.9 ??F (37.2 ??C)   Resp 16   Ht 5\' 7"  (1.702 m)   Wt 72.3 kg (159 lb 6.3 oz)   SpO2 100%   BMI 24.96 kg/m??         .  Intake/Output Summary (Last 24 hours) at 08/10/2018 0921  Last data filed at 08/10/2018 0540  Gross per 24 hour   Intake 2090.33 ml   Output ???   Net 2090.33 ml       Physical Exam:     GEN - AAOx3, anxious, tired  HEENT - , mucous membranes moist  Neck - supple, no JVD   Cardiac - RRR, S1, S2, no murmurs  Chest/Lungs - clear to auscultation without wheezes or rhonchi  Abdomen - soft, mild generalized tenderness without guarding  Extremities - no clubbing/ cyanosis/ edema  Neuro - CN 2-12 intact. No focal deficits. No motor or sensory deficit appreciated.   Skin - no rashes or lesions    Current medications:       Current Facility-Administered Medications:   ???  nitazoxanide (ALINIA) tablet 1,000 mg, 1,000 mg, Oral, BID WITH MEALS, Jearld Adjutant, MD, 1,000 mg at 08/09/18 1736  ???  albuterol (PROVENTIL VENTOLIN) nebulizer solution 2.5 mg, 2.5 mg, Nebulization, Q4H PRN, Hutchinson, Anne, DO  ???  dicyclomine (BENTYL) capsule 10 mg, 10 mg, Oral, QID, Hutchinson, Anne, DO, 10 mg at 08/09/18 2151  ???  acetaminophen (TYLENOL)  tablet 650 mg, 650 mg, Oral, Q4H PRN, 650 mg at 08/08/18 1418 **OR** acetaminophen (TYLENOL) solution 650 mg, 650 mg, Oral, Q4H PRN **OR** acetaminophen (TYLENOL) suppository 650 mg, 650 mg, Rectal, Q4H PRN, Hutchinson, Anne, DO  ???  naloxone (NARCAN) injection 0.1 mg, 0.1 mg, IntraVENous, PRN, Hutchinson, Anne, DO  ???  dextrose 5% - 0.45% NaCl with KCl 20 mEq/L infusion, 75 mL/hr, IntraVENous, CONTINUOUS, Hutchinson, Anne, DO, Last Rate: 75 mL/hr at 08/10/18 0539, 75 mL/hr at 08/10/18 0539  ???  morphine injection 2 mg, 2 mg, IntraVENous, Q4H PRN, Hutchinson, Anne, DO, 2 mg at 08/07/18 1855  ???  ondansetron (ZOFRAN) injection 4 mg, 4 mg, IntraVENous, Q4H PRN, Hutchinson, Anne, DO, 4 mg at 08/08/18 0807    Labs:     Recent Results (from the past 24 hour(s))   CBC WITH AUTOMATED DIFF    Collection Time: 08/09/18 12:53 PM   Result Value Ref Range    WBC 3.1 (L) 4.0 - 11.0 1000/mm3    RBC 4.55 3.80 - 5.70 M/uL    HGB 12.9 12.4 - 17.2 gm/dl    HCT 16.136.3 (L) 09.637.0 - 50.0 %    MCV 79.8 (L) 80.0 - 98.0 fL    MCH 28.4 23.0 - 34.6 pg    MCHC 35.5 30.0 - 36.0 gm/dl    PLATELET 045113 (L) 409140 - 450 1000/mm3    MPV 13.7 (H) 6.0 - 10.0 fL    RDW-SD 37.4 35.1 - 43.9      NRBC 0 0 - 0       IMMATURE GRANULOCYTES 0.3 0.0 - 3.0 %    NEUTROPHILS 55.4 34 - 64 %    LYMPHOCYTES 20.4 (L) 28 - 48 %    MONOCYTES 22.0 (H) 1 - 13 %    EOSINOPHILS 1.3 0 - 5 %    BASOPHILS 0.6 0 - 3 %   CBC WITH AUTOMATED DIFF    Collection Time: 08/10/18  3:12 AM   Result Value Ref Range    WBC 4.7 4.0 - 11.0 1000/mm3    RBC 4.56 3.80 - 5.70 M/uL    HGB 12.9 12.4 - 17.2 gm/dl    HCT 81.136.4 (L) 91.437.0 - 50.0 %    MCV 79.8 (L) 80.0 - 98.0 fL    MCH 28.3 23.0 - 34.6 pg    MCHC 35.4 30.0 - 36.0 gm/dl    PLATELET 782122 (L) 956140 - 450 1000/mm3    MPV 13.8 (H) 6.0 - 10.0 fL    RDW-SD 38.5 35.1 - 43.9      NRBC 0 0 - 0      IMMATURE GRANULOCYTES 0.4 0.0 - 3.0 %    NEUTROPHILS 68.2 (H) 34 - 64 %    LYMPHOCYTES 11.9 (L) 28 - 48 %    MONOCYTES 16.6 (H) 1 - 13 %    EOSINOPHILS 2.5 0 - 5 %    BASOPHILS 0.4 0 - 3 %       XR Results:  Results from Hospital Encounter encounter on 07/21/18   XR ABD (AP AND ERECT OR DECUB)    Narrative Clinical history: Abdominal pain, ileus    EXAMINATION:  Supine and upright projections of the abdomen 07/30/2018    FINDINGS:  Air is noted in the ascending, transverse and portions of the descending colon  and rectosigmoid. No dilated loops of small or bowel. Multiple air-fluid levels  on the upright projection.      Impression IMPRESSION:  Ileus.         CT Results:  Results from Hospital Encounter encounter on 08/06/18   CT ABD PELV WO CONT    Narrative CT Abdomen and Pelvis without    Indication: Abdominal pain. Nausea, vomiting, diarrhea.    Comparison: 07/22/2018.    TECHNIQUE:   CT of the abdomen and pelvis WITHOUT intravenous contrast. Coronal and sagittal  reformations were obtained. Oral contrast was given.    All CT exams at this facility use one or more dose reduction techniques  including automatic exposure control, mA/kV adjustment per patient's size, or  iterative reconstruction technique. DICOM format imaged data is available to  non-affiliated external healthcare facilities or entities on a secure, media   free, reciprocal searchable basis with patient authorization for 12 months  following the date of the study.    DISCUSSION:    ABSENCE OF INTRAVENOUS CONTRAST DECREASES SENSITIVITY FOR DETECTION OF FOCAL  LESIONS AND VASCULAR PATHOLOGY.    LOWER THORAX: Normal.    HEPATOBILIARY: No focal hepatic lesions.  No biliary ductal dilatation.  SPLEEN: No splenomegaly.  PANCREAS: No focal masses or ductal dilatation.    ADRENALS: No adrenal nodules.  KIDNEYS/URETERS: No hydronephrosis, stones, or solid mass lesions.  PELVIC ORGANS/BLADDER: Unremarkable.    PERITONEUM / RETROPERITONEUM: No free air or fluid.  LYMPH NODES: No lymphadenopathy.  VESSELS: Unremarkable.    GI TRACT: Multiple nondistended fluid-filled loops of small bowel with  questionable mild wall thickening. Fluid-filled colon with no significant wall  thickening. No obstruction. Normal appendix.    BONES AND SOFT TISSUES: No acute abnormality.      Impression IMPRESSION:    Numerous fluid-filled loops of small and large bowel with borderline diffuse  small bowel wall thickening, concerning for infectious or inflammatory  enterocolitis. Findings have slightly progressed since 07/22/2018.         MRI Results:  No results found for this or any previous visit.    Nuclear Medicine Results:  No results found for this or any previous visit.    US Results:  Results from Hospital Encounter encounter on 07/21/18   US RUQ    Narrative Clinical history: Elevated liver function studies    EXAMINATION:  Upper quadrant ultrasound 07/28/2018    FINDINGS:  Liver demonstrates homogeneous echotexture. No intrahepatic bile duct  dilatation. Bile duct measures 4 mm. No stones are seen in the gallbladder.  Gallbladder wall measures 2 mm. Sonographic Eulah PontMurphy sign is negative.    Pancreas, proximal aorta and IVC are within normal limits. Right kidney measures  10.4 x 5.6 x 3.7 cm. Normal corticomedullary differentiation. No hydronephrosis.  Small amounts of perihepatic ascites.       Impression IMPRESSION:  Small amounts of perihepatic ascites.         IR Results:  No results found for this or any previous visit.    VAS/US Results:  No results found for this or any previous visit.      Total clinical care time was 38   minutes of which more than 50% was spent in coordination of care and counseling (time spent with patient/family face to face, physical exam, reviewing laboratory and imaging investigations, speaking with physicians and nursing staff involved in this patient's care).     Tia MaskerUCHITA Briella Hobday,  MD  [facility]  [facility]  August 10, 2018  Time: 9:21 AM

## 2018-08-10 NOTE — Progress Notes (Signed)
Dr. Mooney requesting HIV follow up appointment at [facility]

## 2018-08-10 NOTE — Other (Signed)
Overnight, pt had 4x loose Bowel movements. Will endorse

## 2018-08-10 NOTE — Consults (Signed)
Consults by Alfonso EllisMooney,  Kimbery Harwood, MD at 08/10/18 1554                Author: Alfonso EllisMooney, Saryah Loper, MD  Service: Infectious Disease  Author Type: Physician       Filed: 08/10/18 1557  Date of Service: 08/10/18 1554  Status: Signed          Editor: Alfonso EllisMooney, Audric Venn, MD (Physician)                                                                                       INFECTIOUS DISEASE PROGRESS    NOTE          Requested by: Dr  [name]      Reason for consult: chronic diarrhea      Date of admission: 08/06/2018      Date of consult: August 10, 2018           ABX:           Current abx  Prior abx         nitazoxinide 6/13  2            ASSESSMENT:            cryptosporidia/ Giardia concomitant infection    source unknown        HIV- new diagnosis        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        neutropenia with adequate anc                         nkda                RECOMMENDATIONS:        1- micro  repeated the testing on the stool that resulted in BOTH + for giardia AND crypto- possible- with exposure to contaminated source water,  Again +   HIV testing + HIV An, pcr, cd4 IP, genomic testing for resistance ordered      Rx  With nitazoxanide 1000 mg po bid x 3 d, then, if improved,may  decrease  to 500 mg po bid to complete  x 14 - 28 d   Monitor stool count   HIV AN +   CD4, VL IP   Will obtain genomic testing - for appropriate HAART  determination    HIV baseline labs- after review of [facility] w/u- to avoid duplication    update vaccines when CD4 adequate- when out pt with other ID provider   Pt will need to be set up with long term provider for management as outpt for this- DW pt in detail      He is to see [facility] as new pt for his HIV management and [facility] can folow him on the nitazoxinide- he will need 2-4 weeks      Would like to have the presscrition filled for 1000 mg bid to complete 14 d prior to leaving [facility]- as outside pharmacies may not carry      DW Dr Sandie AnoSimoes  MICROBIOLOGY:            5/27  Neg c diff   6/2    Stool culture neg   6/12  + for BOTH giardia and cryptosporidia-- lab repeated test with same results     LINES AND CATHETERS:     PIV        CC     Still significant # stools. A bit better.  Eating. No other co        Past medical history:          Past Medical History:        Diagnosis  Date         ?  Asthma       ?  Hypertension       ?  Neurological disorder            Bell's palsy           History reviewed. No pertinent surgical history.                  Current Medications:          Current Facility-Administered Medications          Medication  Dose  Route  Frequency           ?  nitazoxanide (ALINIA) tablet 1,000 mg   1,000 mg  Oral  BID WITH MEALS     ?  albuterol (PROVENTIL VENTOLIN) nebulizer solution 2.5 mg   2.5 mg  Nebulization  Q4H PRN           ?  dicyclomine (BENTYL) capsule 10 mg   10 mg  Oral  QID           ?  acetaminophen (TYLENOL) tablet 650 mg   650 mg  Oral  Q4H PRN          Or           ?  acetaminophen (TYLENOL) solution 650 mg   650 mg  Oral  Q4H PRN          Or           ?  acetaminophen (TYLENOL) suppository 650 mg   650 mg  Rectal  Q4H PRN     ?  naloxone (NARCAN) injection 0.1 mg   0.1 mg  IntraVENous  PRN     ?  dextrose 5% - 0.45% NaCl with KCl 20 mEq/L infusion   75 mL/hr  IntraVENous  CONTINUOUS     ?  morphine injection 2 mg   2 mg  IntraVENous  Q4H PRN           ?  ondansetron (ZOFRAN) injection 4 mg   4 mg  IntraVENous  Q4H PRN              Physical Exam:   Vitals   Temp (24hrs), Avg:98.5 ??F (36.9 ??C), Min:98.1 ??F (36.7 ??C), Max:98.9 ??F (37.2 ??C)      Visit Vitals      BP  131/73 (BP 1 Location: Right arm, BP Patient Position: Supine)     Pulse  82     Temp  98.5 ??F (36.9 ??C)     Resp  16     Ht  5\' 7"  (1.702 m)     Wt  72.3 kg (159 lb 6.3 oz)     SpO2  100%        BMI  24.96 kg/m??  General: slender, 28 y.o. year-old, male , in no acute distress   HEENT: Normocephalic, anicteric sclerae, Pupils equal, round reactive to light, no oropharyngeal  lesions. No sinus tenderness. Dentition in mediocre repair   Neck: Supple, no lymphadenopathy, masses or thyromegaly   Chest: Symmetrical expansion   Lungs: Clear to auscultation bilaterally, no dullness   Heart: Regular rhythm, no murmur, no rub or gallop, No JVD   Abdomen: Soft, some TTP, no rebound.slightly distended, no organomegaly, BS+   Musculoskeletal: Normal strength/tone. No edema. No clubbing or cyanosis   CNS: AAOx3. Cranial nerves II-XII intact. Grossly normal.No NR   SKIN: No skin lesion or rash. Dry, warm, intact                 Labs:  Results:        Chemistry  No results for input(s): GLU, NA, K, CL, CO2, BUN, CREA, CA, AGAP, BUCR, TBIL, AP, TP, ALB, GLOB, AGRAT in the last 72 hours.      No lab exists for component: GPT        CBC w/Diff  Recent Labs         08/10/18   0312  08/09/18   1253      WBC  4.7  3.1*      RBC  4.56  4.55      HGB  12.9  12.9      HCT  36.4*  36.3*      PLT  122*  113*      GRANS  68.2*  55.4      LYMPH  11.9*  20.4*      EOS  2.5  1.3                 Microbiology  No results for input(s): CULT in the last 72 hours.           Imaging-        Results from [place] encounter on 07/21/18     XR ABD (AP AND ERECT OR DECUB)           Narrative  Clinical history: Abdominal pain, ileus      EXAMINATION:   Supine and upright projections of the abdomen 07/30/2018      FINDINGS:   Air is noted in the ascending, transverse and portions of the descending colon   and rectosigmoid. No dilated loops of small or bowel. Multiple air-fluid levels   on the upright projection.              Impression  IMPRESSION:   Ileus.                Results from [place] encounter on 08/06/18     CT ABD PELV WO CONT           Narrative  CT Abdomen and Pelvis without      Indication: Abdominal pain. Nausea, vomiting, diarrhea.      Comparison: 07/22/2018.      TECHNIQUE:    CT of the abdomen and pelvis WITHOUT intravenous contrast. Coronal and sagittal   reformations were obtained. Oral  contrast was given.      All CT exams at this facility use one or more dose reduction techniques   including automatic exposure control, mA/kV adjustment per patient's size, or   iterative reconstruction technique. DICOM format imaged data is available to   non-affiliated external healthcare facilities or entities on a secure, media   free, reciprocal  searchable basis with patient authorization for 12 months   following the date of the study.      DISCUSSION:      ABSENCE OF INTRAVENOUS CONTRAST DECREASES SENSITIVITY FOR DETECTION OF FOCAL   LESIONS AND VASCULAR PATHOLOGY.      LOWER THORAX: Normal.      HEPATOBILIARY: No focal hepatic lesions.  No biliary ductal dilatation.   SPLEEN: No splenomegaly.   PANCREAS: No focal masses or ductal dilatation.      ADRENALS: No adrenal nodules.   KIDNEYS/URETERS: No hydronephrosis, stones, or solid mass lesions.   PELVIC ORGANS/BLADDER: Unremarkable.      PERITONEUM / RETROPERITONEUM: No free air or fluid.   LYMPH NODES: No lymphadenopathy.   VESSELS: Unremarkable.      GI TRACT: Multiple nondistended fluid-filled loops of small bowel with   questionable mild wall thickening. Fluid-filled colon with no significant wall   thickening. No obstruction. Normal appendix.      BONES AND SOFT TISSUES: No acute abnormality.              Impression  IMPRESSION:      Numerous fluid-filled loops of small and large bowel with borderline diffuse   small bowel wall thickening, concerning for infectious or inflammatory   enterocolitis. Findings have slightly progressed since 07/22/2018.              ---------------------------------------------------------------------------------------------------------------   I have independently examined the patient and reviewed all lab studies and imgaing as well as review of nursing notes and physican notes          Alfonso EllisMartha Skylor Schnapp, MD   08/10/2018      [facility]   [phone]

## 2018-08-10 NOTE — Progress Notes (Signed)
Progress Notes by Salley SlaughterSimoes, Irlanda Croghan S, MD at 08/10/18 [id number]                Author: Salley SlaughterSimoes, Maebry Obrien S, MD  Service: Hospitalist  Author Type: Physician       Filed: 08/10/18 0945  Date of Service: 08/10/18 0921  Status: Signed          Editor: Salley SlaughterSimoes, Jayro Mcmath S, MD (Physician)                          INTERNAL MEDICINE PROGRESS NOTE      Date of note:      August 10, 2018      Patient:               Travis [name],  28 y.o., male   Admit Date:        08/06/2018   Length of Stay:  4 day(s)      Problem List:      Patient Active Problem List        Diagnosis  Code         ?  AKI (acute kidney injury) (HCC)  N17.9     ?  Dehydration  E86.0         ?  Enterocolitis  K52.9         ?  Intractable nausea and vomiting  R11.2             Subjective + interval history        Had total 8 BMs over 24 hours. BMs are little more formed now. Not as watery. Still has lot of abdominal cramping and bloating after meals. Bentyl is helping somewhat.         Assessment :        Enterocolitis - stool culture positive for giardia, cryptococcus   New diagnosis of HIV   Dehydration   AKI present on admission   Metabolic acidosis   Hypertension   Asthma   History of Bell's palsy   Elevated LFTs - resolved   Moderate protein calorie malnutrition   Unintentional weight loss ~ 20 lbs in couple of months        Plan :        ID input appreciated. Nitazoxanide 1000 mg po BID x 3 days -> then decrease to 500 mg Po BID to complete 14-28 day course      New diagnosis of HIV. ID on board. HIV RNA and Genomic testing ordered      Continue [name] hydration       SCD and ambulation for DVT ppx ( borderline low platelet count, so not on heparin)      I updated his mom at length on phone [phone]         - Code status: full code      Recommend to continue hospitalization.   Expected date of discharge: tbd   Plan for disposition - tbd        Objective:           Visit Vitals      BP  141/76 (BP 1 Location: Right arm, BP Patient Position: Supine)      Pulse  93     Temp  98.9 ??F (37.2 ??C)     Resp  16     Ht  5\' 7"  (1.702 m)     Wt  72.3 kg (159 lb 6.3 oz)  SpO2  100%        BMI  24.96 kg/m??              .   Intake/Output Summary (Last 24 hours) at 08/10/2018 0921   Last data filed at 08/10/2018 0540     Gross per 24 hour        Intake  2090.33 ml        Output  --        Net  2090.33 ml             Physical Exam:        GEN - AAOx3, anxious, tired   HEENT - , mucous membranes moist   Neck - supple, no JVD   Cardiac - RRR, S1, S2, no murmurs   Chest/Lungs - clear to auscultation without wheezes or rhonchi   Abdomen - soft, mild generalized tenderness without guarding   Extremities - no clubbing/ cyanosis/ edema   Neuro - CN 2-12 intact. No focal deficits. No motor or sensory deficit appreciated.    Skin - no rashes or lesions        Current medications:           Current Facility-Administered Medications:    ?  nitazoxanide (ALINIA) tablet 1,000 mg, 1,000 mg, Oral, BID WITH MEALS, Jearld Adjutant, MD, 1,000 mg at 08/09/18 1736   ?  albuterol (PROVENTIL VENTOLIN) nebulizer solution 2.5 mg, 2.5 mg, Nebulization, Q4H PRN, Hutchinson, Anne, DO   ?  dicyclomine (BENTYL) capsule 10 mg, 10 mg, Oral, QID, Hutchinson, Anne, DO, 10 mg at 08/09/18 2151   ?  acetaminophen (TYLENOL) tablet 650 mg, 650 mg, Oral, Q4H PRN, 650 mg at 08/08/18 1418 **OR** acetaminophen (TYLENOL) solution 650 mg, 650 mg, Oral, Q4H PRN **OR** acetaminophen (TYLENOL) suppository 650 mg, 650 mg, Rectal, Q4H PRN, Hutchinson,  Anne, DO   ?  naloxone (NARCAN) injection 0.1 mg, 0.1 mg, IntraVENous, PRN, Hutchinson, Anne, DO   ?  dextrose 5% - 0.45% NaCl with KCl 20 mEq/L infusion, 75 mL/hr, IntraVENous, CONTINUOUS, Hutchinson, Anne, DO, Last Rate: 75 mL/hr at 08/10/18 0539, 75 mL/hr at 08/10/18 0539   ?  morphine injection 2 mg, 2 mg, IntraVENous, Q4H PRN, Hutchinson, Anne, DO, 2 mg at 08/07/18 1855   ?  ondansetron (ZOFRAN) injection 4 mg, 4 mg, IntraVENous, Q4H PRN, Hutchinson, Anne, DO, 4 mg at  08/08/18 0807        Labs:          Recent Results (from the past 24 hour(s))     CBC WITH AUTOMATED DIFF          Collection Time: 08/09/18 12:53 PM         Result  Value  Ref Range            WBC  3.1 (L)  4.0 - 11.0 1000/mm3       RBC  4.55  3.80 - 5.70 M/uL       HGB  12.9  12.4 - 17.2 gm/dl       HCT  36.3 (L)  37.0 - 50.0 %       MCV  79.8 (L)  80.0 - 98.0 fL       MCH  28.4  23.0 - 34.6 pg       MCHC  35.5  30.0 - 36.0 gm/dl       PLATELET  113 (L)  140 - 450 1000/mm3  MPV  13.7 (H)  6.0 - 10.0 fL       RDW-SD  37.4  35.1 - 43.9         NRBC  0  0 - 0         IMMATURE GRANULOCYTES  0.3  0.0 - 3.0 %       NEUTROPHILS  55.4  34 - 64 %       LYMPHOCYTES  20.4 (L)  28 - 48 %       MONOCYTES  22.0 (H)  1 - 13 %       EOSINOPHILS  1.3  0 - 5 %       BASOPHILS  0.6  0 - 3 %       CBC WITH AUTOMATED DIFF          Collection Time: 08/10/18  3:12 AM         Result  Value  Ref Range            WBC  4.7  4.0 - 11.0 1000/mm3       RBC  4.56  3.80 - 5.70 M/uL       HGB  12.9  12.4 - 17.2 gm/dl       HCT  10.936.4 (L)  60.437.0 - 50.0 %       MCV  79.8 (L)  80.0 - 98.0 fL       MCH  28.3  23.0 - 34.6 pg       MCHC  35.4  30.0 - 36.0 gm/dl       PLATELET  540122 (L)  140 - 450 1000/mm3       MPV  13.8 (H)  6.0 - 10.0 fL       RDW-SD  38.5  35.1 - 43.9         NRBC  0  0 - 0         IMMATURE GRANULOCYTES  0.4  0.0 - 3.0 %       NEUTROPHILS  68.2 (H)  34 - 64 %       LYMPHOCYTES  11.9 (L)  28 - 48 %       MONOCYTES  16.6 (H)  1 - 13 %       EOSINOPHILS  2.5  0 - 5 %            BASOPHILS  0.4  0 - 3 %           XR Results:     Results from Hospital Encounter encounter on 07/21/18     XR ABD (AP AND ERECT OR DECUB)           Narrative  Clinical history: Abdominal pain, ileus      EXAMINATION:   Supine and upright projections of the abdomen 07/30/2018      FINDINGS:   Air is noted in the ascending, transverse and portions of the descending colon   and rectosigmoid. No dilated loops of small or bowel. Multiple air-fluid levels   on the  upright projection.              Impression  IMPRESSION:   Ileus.              CT Results:     Results from Hospital Encounter encounter on 08/06/18     CT ABD PELV WO CONT           Narrative  CT Abdomen  and Pelvis without      Indication: Abdominal pain. Nausea, vomiting, diarrhea.      Comparison: 07/22/2018.      TECHNIQUE:    CT of the abdomen and pelvis WITHOUT intravenous contrast. Coronal and sagittal   reformations were obtained. Oral contrast was given.      All CT exams at this facility use one or more dose reduction techniques   including automatic exposure control, mA/kV adjustment per patient's size, or   iterative reconstruction technique. DICOM format imaged data is available to   non-affiliated external healthcare facilities or entities on a secure, media   free, reciprocal searchable basis with patient authorization for 12 months   following the date of the study.      DISCUSSION:      ABSENCE OF INTRAVENOUS CONTRAST DECREASES SENSITIVITY FOR DETECTION OF FOCAL   LESIONS AND VASCULAR PATHOLOGY.      LOWER THORAX: Normal.      HEPATOBILIARY: No focal hepatic lesions.  No biliary ductal dilatation.   SPLEEN: No splenomegaly.   PANCREAS: No focal masses or ductal dilatation.      ADRENALS: No adrenal nodules.   KIDNEYS/URETERS: No hydronephrosis, stones, or solid mass lesions.   PELVIC ORGANS/BLADDER: Unremarkable.      PERITONEUM / RETROPERITONEUM: No free air or fluid.   LYMPH NODES: No lymphadenopathy.   VESSELS: Unremarkable.      GI TRACT: Multiple nondistended fluid-filled loops of small bowel with   questionable mild wall thickening. Fluid-filled colon with no significant wall   thickening. No obstruction. Normal appendix.      BONES AND SOFT TISSUES: No acute abnormality.              Impression  IMPRESSION:      Numerous fluid-filled loops of small and large bowel with borderline diffuse   small bowel wall thickening, concerning for infectious or inflammatory   enterocolitis. Findings have  slightly progressed since 07/22/2018.              MRI Results:   No results found for this or any previous visit.      Nuclear Medicine Results:   No results found for this or any previous visit.      US Results:     Results from Hospital Encounter encounter on 07/21/18     US RUQ           Narrative  Clinical history: Elevated liver function studies      EXAMINATION:   Upper quadrant ultrasound 07/28/2018      FINDINGS:   Liver demonstrates homogeneous echotexture. No intrahepatic bile duct   dilatation. Bile duct measures 4 mm. No stones are seen in the gallbladder.   Gallbladder wall measures 2 mm. Sonographic Eulah PontMurphy sign is negative.      Pancreas, proximal aorta and IVC are within normal limits. Right kidney measures   10.4 x 5.6 x 3.7 cm. Normal corticomedullary differentiation. No hydronephrosis.   Small amounts of perihepatic ascites.              Impression  IMPRESSION:   Small amounts of perihepatic ascites.              IR Results:   No results found for this or any previous visit.      VAS/US Results:   No results found for this or any previous visit.         Total clinical care time was 38   minutes of which  more than 50% was spent in coordination of care and counseling (time spent with patient/family face to face, physical exam, reviewing laboratory and imaging investigations, speaking with physicians and  nursing staff involved in this patient's care).       Tia Masker,  MD   [facility]   August 10, 2018   Time: 9:21 AM

## 2018-08-10 NOTE — Progress Notes (Signed)
Dr. Glynda Jaeger requesting HIV follow up appointment at [facility]

## 2018-08-10 NOTE — Progress Notes (Signed)
Problem: Nausea/Vomiting (Adult)  Goal: *Absence of nausea/vomiting  Outcome: Progressing Towards Goal     Problem: Patient Education: Go to Patient Education Activity  Goal: Patient/Family Education  Outcome: Progressing Towards Goal     Problem: Pain  Goal: *Control of Pain  Outcome: Progressing Towards Goal     Problem: Patient Education: Go to Patient Education Activity  Goal: Patient/Family Education  Outcome: Progressing Towards Goal     Problem: Falls - Risk of  Goal: *Absence of Falls  Description: Document Schmid Fall Risk and appropriate interventions in the flowsheet.  Outcome: Progressing Towards Goal  Note: Fall Risk Interventions:  Mobility Interventions: Bed/chair exit alarm         Medication Interventions: Assess postural VS orthostatic hypotension    Elimination Interventions: Bed/chair exit alarm              Problem: Patient Education: Go to Patient Education Activity  Goal: Patient/Family Education  Outcome: Progressing Towards Goal

## 2018-08-11 LAB — COMPREHENSIVE METABOLIC PANEL
ALT: 181 U/L — ABNORMAL HIGH (ref 12–78)
AST: 139 U/L — ABNORMAL HIGH (ref 15–37)
Albumin: 3 gm/dl — ABNORMAL LOW (ref 3.4–5.0)
Alkaline Phosphatase: 90 U/L (ref 45–117)
Anion Gap: 9 mmol/L (ref 5–15)
BUN: 16 mg/dl (ref 7–25)
CO2: 17 mEq/L — ABNORMAL LOW (ref 21–32)
Calcium: 8 mg/dl — ABNORMAL LOW (ref 8.5–10.1)
Chloride: 113 mEq/L — ABNORMAL HIGH (ref 98–107)
Creatinine: 0.8 mg/dl (ref 0.6–1.3)
EGFR IF NonAfrican American: >60
GFR African American: >60
Glucose: 91 mg/dl (ref 74–106)
Potassium: 2.9 mEq/L — CL (ref 3.5–5.1)
Sodium: 139 mEq/L (ref 136–145)
Total Bilirubin: 0.3 mg/dl (ref 0.2–1.0)
Total Protein: 7.5 gm/dl (ref 6.4–8.2)

## 2018-08-11 LAB — CBC WITH AUTO DIFFERENTIAL
Basophils %: 0.3 % (ref 0–3)
Eosinophils %: 2.4 % (ref 0–5)
Hematocrit: 37.5 % (ref 37.0–50.0)
Hemoglobin: 13.1 gm/dl (ref 12.4–17.2)
Immature Granulocytes: 0.3 % (ref 0.0–3.0)
Lymphocytes %: 15.2 % — ABNORMAL LOW (ref 28–48)
MCH: 28.1 pg (ref 23.0–34.6)
MCHC: 34.9 gm/dl (ref 30.0–36.0)
MCV: 80.5 fL (ref 80.0–98.0)
MPV: 13.5 fL — ABNORMAL HIGH (ref 6.0–10.0)
Monocytes %: 13.5 % — ABNORMAL HIGH (ref 1–13)
Neutrophils %: 68.3 % — ABNORMAL HIGH (ref 34–64)
Nucleated RBCs: 0 (ref 0–0)
Platelets: 137 10*3/uL — ABNORMAL LOW (ref 140–450)
RBC: 4.66 M/uL (ref 3.80–5.70)
RDW-SD: 39.9 (ref 35.1–43.9)
WBC: 6.2 10*3/uL (ref 4.0–11.0)

## 2018-08-11 LAB — POTASSIUM
Potassium: 9.1 mEq/L (ref 3.5–5.1)
Potassium: 9.1 mEq/L — CR (ref 3.5–5.1)
Potassium: 3.4 mEq/L — ABNORMAL LOW (ref 3.5–5.1)
Potassium: 3.4 mEq/L — ABNORMAL LOW (ref 3.5–5.1)

## 2018-08-11 LAB — HIV-1 RNA QT BY PCR
Copies/ML: 1150000 Copies/mL — ABNORMAL HIGH
Copies/mL: 1150000 Copies/mL — ABNORMAL HIGH
LOG COPIES/ML: 6.06 Log cps/mL — ABNORMAL HIGH
Log copies/mL: 6.06 Log cps/mL — ABNORMAL HIGH

## 2018-08-11 LAB — HIV 1/2 AG/AB 4TH GENERATION
HIV 1/2 AG/AB 4TH GENERATION: REACTIVE — AB
HIV 1/2 AG/AB 4TH GENERATION: REACTIVE — AB

## 2018-08-11 LAB — HIV-1/2 ANTIBODY, DIFFERENTIATION
HIV 2 ANTIBODY, 86009057: NEGATIVE
HIV 2 Antibody: NEGATIVE
HIV-1 Ab: POSITIVE — AB
HIV-1 Ab: POSITIVE — AB

## 2018-08-11 LAB — METABOLIC PANEL, COMPREHENSIVE
ALT (SGPT): 181 U/L — ABNORMAL HIGH (ref 12–78)
AST (SGOT): 139 U/L — ABNORMAL HIGH (ref 15–37)
Albumin: 3 gm/dl — ABNORMAL LOW (ref 3.4–5.0)
Alk. phosphatase: 90 U/L (ref 45–117)
Anion gap: 9 mmol/L (ref 5–15)
BUN: 16 mg/dl (ref 7–25)
Bilirubin, total: 0.3 mg/dl (ref 0.2–1.0)
CO2: 17 mEq/L — ABNORMAL LOW (ref 21–32)
Calcium: 8 mg/dl — ABNORMAL LOW (ref 8.5–10.1)
Chloride: 113 mEq/L — ABNORMAL HIGH (ref 98–107)
Creatinine: 0.8 mg/dl (ref 0.6–1.3)
GFR est AA: >60
GFR est non-AA: >60
Glucose: 91 mg/dl (ref 74–106)
Potassium: 2.9 mEq/L — CL (ref 3.5–5.1)
Protein, total: 7.5 gm/dl (ref 6.4–8.2)
Sodium: 139 mEq/L (ref 136–145)

## 2018-08-11 LAB — CBC WITH AUTOMATED DIFF
BASOPHILS: 0.3 % (ref 0–3)
EOSINOPHILS: 2.4 % (ref 0–5)
HCT: 37.5 % (ref 37.0–50.0)
HGB: 13.1 gm/dl (ref 12.4–17.2)
IMMATURE GRANULOCYTES: 0.3 % (ref 0.0–3.0)
LYMPHOCYTES: 15.2 % — ABNORMAL LOW (ref 28–48)
MCH: 28.1 pg (ref 23.0–34.6)
MCHC: 34.9 gm/dl (ref 30.0–36.0)
MCV: 80.5 fL (ref 80.0–98.0)
MONOCYTES: 13.5 % — ABNORMAL HIGH (ref 1–13)
MPV: 13.5 fL — ABNORMAL HIGH (ref 6.0–10.0)
NEUTROPHILS: 68.3 % — ABNORMAL HIGH (ref 34–64)
NRBC: 0 (ref 0–0)
PLATELET: 137 10*3/uL — ABNORMAL LOW (ref 140–450)
RBC: 4.66 M/uL (ref 3.80–5.70)
RDW-SD: 39.9 (ref 35.1–43.9)
WBC: 6.2 10*3/uL (ref 4.0–11.0)

## 2018-08-11 MED ORDER — POTASSIUM CHLORIDE SR 20 MEQ TAB, PARTICLES/CRYSTALS
20 mEq | ORAL | Status: AC
Start: 2018-08-11 — End: 2018-08-11
  Administered 2018-08-11: 10:00:00 via ORAL

## 2018-08-11 MED ORDER — NITAZOXANIDE 500 MG TAB
500 mg | Freq: Two times a day (BID) | ORAL | Status: DC
Start: 2018-08-11 — End: 2018-08-12
  Administered 2018-08-12 (×2): via ORAL

## 2018-08-11 MED ORDER — NITAZOXANIDE 500 MG TAB
500 mg | Freq: Two times a day (BID) | ORAL | Status: DC
Start: 2018-08-11 — End: 2018-08-11

## 2018-08-11 MED ORDER — POTASSIUM CHLORIDE SR 20 MEQ TAB, PARTICLES/CRYSTALS
20 mEq | Freq: Two times a day (BID) | ORAL | Status: DC
Start: 2018-08-11 — End: 2018-08-12
  Administered 2018-08-11 – 2018-08-12 (×3): via ORAL

## 2018-08-11 MED FILL — DICYCLOMINE 10 MG CAP: 10 mg | ORAL | Qty: 1

## 2018-08-11 MED FILL — ONDANSETRON (PF) 4 MG/2 ML INJECTION: 4 mg/2 mL | INTRAMUSCULAR | Qty: 2

## 2018-08-11 MED FILL — ALINIA 500 MG TABLET: 500 mg | ORAL | Qty: 2

## 2018-08-11 MED FILL — POTASSIUM CHLORIDE SR 20 MEQ TAB, PARTICLES/CRYSTALS: 20 mEq | ORAL | Qty: 2

## 2018-08-11 NOTE — Progress Notes (Signed)
Problem: Falls - Risk of  Goal: *Absence of Falls  Description: Document Schmid Fall Risk and appropriate interventions in the flowsheet.  Outcome: Progressing Towards Goal  Note: Fall Risk Interventions:  Mobility Interventions: Bed/chair exit alarm, Patient to call before getting OOB         Medication Interventions: Bed/chair exit alarm, Patient to call before getting OOB    Elimination Interventions: Bed/chair exit alarm, Call light in reach

## 2018-08-11 NOTE — Progress Notes (Addendum)
Page SentPAGER ID: [phone] >MESSAGE [phone], Mr. Scannell. Alex from Laboratory called with a critical potassium result of 9. please advise. Crystal G, RN #[phone]

## 2018-08-11 NOTE — Other (Signed)
Bedside and Verbal shift change report given to Sheerie A Boston   (oncoming nurse) by Crystal RN (offgoing nurse). Report included the following information SBAR, Kardex and MAR.

## 2018-08-11 NOTE — Progress Notes (Addendum)
PAGER ID: [phone]   MESSAGE: [phone] [name], Travis IV. DX Enterocolitis/dehydration. Critical lab Potassium 2.9. Sheerie [phone]      Awaiting MD call back.     0535 Dr Morris called back with new order Potassium 40 meq PO x1 dose. Repeat lab at 1100. Will continue to monitor

## 2018-08-11 NOTE — Progress Notes (Signed)
08/11/18 0515   Critical Result Types   Type of Critical Result Laboratory   Critical Lab Result Types   Critical Lab Value Electrolytes   Potassium Value 2.9   Notification Information   Notified By (Name) Anne Marie Howal   Time of Critical Result Notification 0511   Verbal Readback Provided Yes   Provider Notification   Was Provider Notified Yes

## 2018-08-11 NOTE — Progress Notes (Signed)
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    neutropenia with adequate anc             nkda         RECOMMENDATIONS:     1- micro  repeated the testing on the stool that resulted in BOTH + for giardia AND crypto- possible- with exposure to contaminated source water,  Again +  HIV testing + HIV An, pcr, cd4 IP, genomic testing for resistance ordered    Rx  With nitazoxanide 1000 mg po bid till dc , then 500 mg bid to complete    x 14 - 28 d  Monitor stool count  HIV AN +  CD4, VL IP  Will obtain genomic testing - for appropriate HAART  determination - sent out 6/15-IP  HIV baseline labs- after review of [facility] w/u- to avoid duplication   update vaccines when CD4 adequate- when out pt with other ID provider  Pt will need to be set up with long term provider for management as outpt for this- [facility] pt in detail- [facility] ID is best choice    He is to see [facility] as new pt for his HIV management and [facility] can folow him on the nitazoxinide- he will need 2-4 weeks    Would like to have the presscrition filled for 500 mg bid to complete 14 d prior to leaving [facility]- as outside pharmacies may not carry. [facility]- appreciate the assist    [facility] Dr Sandie AnoSimoes                     MICROBIOLOGY:       5/27  Neg c diff  6/2    Stool culture neg  6/12  + for BOTH giardia and cryptosporidia-- lab repeated test with same results  LINES AND CATHETERS:   PIV    CC    better today..  Eating. One episode of vomiting this am- feels better.  No other co    Past medical history:     Past Medical History:   Diagnosis Date   ??? Asthma    ??? Hypertension     ??? Neurological disorder     Bell's palsy       History reviewed. No pertinent surgical history.           Current Medications:     Current Facility-Administered Medications   Medication Dose Route Frequency   ??? nitazoxanide (ALINIA) tablet 1,000 mg  1,000 mg Oral BID WITH MEALS   ??? albuterol (PROVENTIL VENTOLIN) nebulizer solution 2.5 mg  2.5 mg Nebulization Q4H PRN   ??? dicyclomine (BENTYL) capsule 10 mg  10 mg Oral QID   ??? acetaminophen (TYLENOL) tablet 650 mg  650 mg Oral Q4H PRN    Or   ??? acetaminophen (TYLENOL) solution 650 mg  650 mg Oral Q4H PRN    Or   ???  acetaminophen (TYLENOL) suppository 650 mg  650 mg Rectal Q4H PRN   ??? naloxone (NARCAN) injection 0.1 mg  0.1 mg IntraVENous PRN   ??? dextrose 5% - 0.45% NaCl with KCl 20 mEq/L infusion  75 mL/hr IntraVENous CONTINUOUS   ??? morphine injection 2 mg  2 mg IntraVENous Q4H PRN   ??? ondansetron (ZOFRAN) injection 4 mg  4 mg IntraVENous Q4H PRN         Physical Exam:  Vitals  Temp (24hrs), Avg:98.6 ??F (37 ??C), Min:98.1 ??F (36.7 ??C), Max:99.8 ??F (37.7 ??C)    Visit Vitals  BP 149/85 (BP 1 Location: Right arm, BP Patient Position: Supine)   Pulse (!) 104   Temp 98.5 ??F (36.9 ??C)   Resp 18   Ht 5\' 7"  (1.702 m)   Wt 72.3 kg (159 lb 6.3 oz)   SpO2 100%   BMI 24.96 kg/m??       General: slender, 28 y.o. year-old, male, in no acute distress  HEENT: Normocephalic, anicteric sclerae, Pupils equal, round reactive to light, no oropharyngeal lesions. No sinus tenderness. Dentition in mediocre repair  Neck: Supple, no lymphadenopathy, masses or thyromegaly  Chest: Symmetrical expansion  Lungs: Clear to auscultation bilaterally, no dullness  Heart: Regular rhythm, no murmur, no rub or gallop, No JVD  Abdomen: Soft, some TTP, no rebound.slightly distended, no organomegaly, BS+  Musculoskeletal: Normal strength/tone. No edema. No clubbing or cyanosis  CNS: AAOx3. Cranial nerves II-XII intact. Grossly normal.No NR  SKIN: No skin lesion or rash. Dry, warm, intact           Labs: Results:   Chemistry Recent Labs     08/11/18  0259   GLU 91   NA 139   K 2.9*   CL 113*   CO2 17*   BUN 16   CREA 0.8   CA 8.0*   AGAP 9   AP 90   TP 7.5   ALB 3.0*      CBC w/Diff Recent Labs     08/11/18  0259 08/10/18  0312 08/09/18  1253   WBC 6.2 4.7 3.1*   RBC 4.66 4.56 4.55   HGB 13.1 12.9 12.9   HCT 37.5 36.4* 36.3*   PLT 137* 122* 113*   GRANS 68.3* 68.2* 55.4   LYMPH 15.2* 11.9* 20.4*   EOS 2.4 2.5 1.3      Microbiology No results for input(s): CULT in the last 72 hours.       Imaging-    Results from [place] encounter on 07/21/18   XR ABD (AP AND ERECT OR DECUB)    Narrative Clinical history: Abdominal pain, ileus    EXAMINATION:  Supine and upright projections of the abdomen 07/30/2018    FINDINGS:  Air is noted in the ascending, transverse and portions of the descending colon  and rectosigmoid. No dilated loops of small or bowel. Multiple air-fluid levels  on the upright projection.      Impression IMPRESSION:  Ileus.         Results from [facility] encounter on 08/06/18   CT ABD PELV WO CONT    Narrative CT Abdomen and Pelvis without    Indication: Abdominal pain. Nausea, vomiting, diarrhea.    Comparison: 07/22/2018.    TECHNIQUE:   CT of the abdomen and pelvis WITHOUT intravenous contrast. Coronal and sagittal  reformations were obtained. Oral contrast was given.    All CT exams at this facility use one or more dose  reduction techniques  including automatic exposure control, mA/kV adjustment per patient's size, or  iterative reconstruction technique. DICOM format imaged data is available to  non-affiliated external healthcare facilities or entities on a secure, media  free, reciprocal searchable basis with patient authorization for 12 months  following the date of the study.    DISCUSSION:    ABSENCE OF INTRAVENOUS CONTRAST DECREASES SENSITIVITY FOR DETECTION OF FOCAL  LESIONS AND VASCULAR PATHOLOGY.    LOWER THORAX: Normal.     HEPATOBILIARY: No focal hepatic lesions.  No biliary ductal dilatation.  SPLEEN: No splenomegaly.  PANCREAS: No focal masses or ductal dilatation.    ADRENALS: No adrenal nodules.  KIDNEYS/URETERS: No hydronephrosis, stones, or solid mass lesions.  PELVIC ORGANS/BLADDER: Unremarkable.    PERITONEUM / RETROPERITONEUM: No free air or fluid.  LYMPH NODES: No lymphadenopathy.  VESSELS: Unremarkable.    GI TRACT: Multiple nondistended fluid-filled loops of small bowel with  questionable mild wall thickening. Fluid-filled colon with no significant wall  thickening. No obstruction. Normal appendix.    BONES AND SOFT TISSUES: No acute abnormality.      Impression IMPRESSION:    Numerous fluid-filled loops of small and large bowel with borderline diffuse  small bowel wall thickening, concerning for infectious or inflammatory  enterocolitis. Findings have slightly progressed since 07/22/2018.         ---------------------------------------------------------------------------------------------------------------  I have independently examined the patient and reviewed all lab studies and imgaing as well as review of nursing notes and physican notes       Alfonso EllisMartha Whitley Strycharz, MD  08/11/2018    [facility]  [phone]

## 2018-08-11 NOTE — Progress Notes (Signed)
Results for Travis [name], Travis [name] (MRN [id number]) as of 08/11/2018 13:31   Ref. Range 08/11/2018 12:38   Potassium Latest Ref Range: 3.5 - 5.1 mEq/L 3.4 (L)

## 2018-08-11 NOTE — Progress Notes (Signed)
New orders for potassium redraw. Lab tech please hold potassium IV fluids before drawing and do not take blood from same arm as IV fluids. Thanks.

## 2018-08-11 NOTE — Progress Notes (Signed)
Per charge nurse Armie, patient vomited moderate amount of yellow emesis, zofran given. Will continue to monitor.

## 2018-08-11 NOTE — Progress Notes (Signed)
08/11/18 1144   Critical Result Types   Type of Critical Result Laboratory   Critical Lab Result Types   Critical Lab Value Electrolytes   Potassium Value 9   Notification Information   Notified By (Name) alex   Time of Critical Result Notification 1145   Verbal Readback Provided Yes   Provider Notification   Was Provider Notified Yes   Name of Provider Simoes   Time Provider Notified 1145   Date Provider Notified 08/11/18

## 2018-08-11 NOTE — Progress Notes (Signed)
INTERNAL MEDICINE PROGRESS NOTE    Date of note:      August 11, 2018    Patient:               Travis [name], 28 y.o., male  Admit Date:        08/06/2018  Length of Stay:  5 day(s)    Problem List:   Patient Active Problem List   Diagnosis Code   ??? AKI (acute kidney injury) ([place]) N17.9   ??? Dehydration E86.0   ??? Enterocolitis K52.9   ??? Intractable nausea and vomiting R11.2       Subjective + interval history     Had total of 3 BMs over the past 24 hours. Had one episode of vomiting this morning but has been able to eat almost 100% of his meals. Abdominal cramping is better    Assessment :   ??  Enterocolitis - stool culture positive for giardia, cryptococcus  New diagnosis of HIV  Dehydration  AKI present on admission  Metabolic acidosis  Hypertension  Asthma  History of Bell's palsy  Elevated LFTs - resolved  Moderate protein calorie malnutrition  Unintentional weight loss ~ 20 lbs in couple of months  ??  Plan :   ??  ID input appreciated. Nitazoxanide 1000 mg po BID  - discussed with Dr. Glendora Score who will kindly talk to pharmacy to arrange this medication at the time of discharge    Potassium is very low 2.9 - oral replacement ordered. K level rechecked at noon. Improved to 3.4     New diagnosis of HIV. ID on board. HIV RNA  1.15 million copies/ml. CD4 count is pending  ??  SCD and ambulation for DVT ppx ( borderline low platelet count, so not on heparin)  ??  I updated his mom at length on phone [phone]    Possible discharge tomorrow if continues to improve  ??  ??  - Code status: full code  ??  Recommend to continue hospitalization.  Expected date of discharge: tbd  Plan for disposition - tbd    Objective:       Visit Vitals  BP 149/85 (BP 1 Location: Right arm, BP Patient Position: Supine)   Pulse (!) 104   Temp 98.5 ??F (36.9 ??C)   Resp 18   Ht 5' 7"  (1.702 m)   Wt 72.3 kg (159 lb 6.3 oz)   SpO2 100%   BMI 24.96 kg/m??             Intake/Output Summary (Last 24 hours) at 08/11/2018 1015   Last data filed at 08/11/2018 0304  Gross per 24 hour   Intake 903.75 ml   Output 1000 ml   Net -96.25 ml       Physical Exam:   GEN - AAOx3, looks tired, but not in distress  HEENT - , mucous membranes moist  Neck - supple, no JVD  Cardiac - RRR, S1, S2, no murmurs  Chest/Lungs - clear to auscultation without wheezes or rhonchi  Abdomen - soft, mild generalized tenderness without guarding  Extremities - no clubbing/ cyanosis/ edema  Neuro - CN 2-12 intact. No focal deficits. No motor or sensory deficit appreciated.   Skin - no rashes or lesions    Current medications:       Current Facility-Administered Medications:   ???  potassium chloride (K-DUR, KLOR-CON) SR tablet 40 mEq, 40 mEq, Oral, BID, Christabella Alvira S, MD  ???  albuterol (PROVENTIL VENTOLIN)  nebulizer solution 2.5 mg, 2.5 mg, Nebulization, Q4H PRN, Hutchinson, Anne, DO  ???  dicyclomine (BENTYL) capsule 10 mg, 10 mg, Oral, QID, Hutchinson, Anne, DO, 10 mg at 08/11/18 0913  ???  acetaminophen (TYLENOL) tablet 650 mg, 650 mg, Oral, Q4H PRN, 650 mg at 08/08/18 1418 **OR** acetaminophen (TYLENOL) solution 650 mg, 650 mg, Oral, Q4H PRN **OR** acetaminophen (TYLENOL) suppository 650 mg, 650 mg, Rectal, Q4H PRN, Hutchinson, Anne, DO  ???  naloxone (NARCAN) injection 0.1 mg, 0.1 mg, IntraVENous, PRN, Hutchinson, Anne, DO  ???  dextrose 5% - 0.45% NaCl with KCl 20 mEq/L infusion, 75 mL/hr, IntraVENous, CONTINUOUS, Hutchinson, Anne, DO, Last Rate: 75 mL/hr at 08/10/18 0539, 75 mL/hr at 08/10/18 0539  ???  morphine injection 2 mg, 2 mg, IntraVENous, Q4H PRN, Hutchinson, Anne, DO, 2 mg at 08/07/18 1855  ???  ondansetron (ZOFRAN) injection 4 mg, 4 mg, IntraVENous, Q4H PRN, Hutchinson, Anne, DO, 4 mg at 08/11/18 1324    Labs:     Recent Results (from the past 24 hour(s))   CBC WITH AUTOMATED DIFF    Collection Time: 08/11/18  2:59 AM   Result Value Ref Range    WBC 6.2 4.0 - 11.0 1000/mm3    RBC 4.66 3.80 - 5.70 M/uL    HGB 13.1 12.4 - 17.2 gm/dl    HCT 37.5 37.0 - 50.0 %     MCV 80.5 80.0 - 98.0 fL    MCH 28.1 23.0 - 34.6 pg    MCHC 34.9 30.0 - 36.0 gm/dl    PLATELET 137 (L) 140 - 450 1000/mm3    MPV 13.5 (H) 6.0 - 10.0 fL    RDW-SD 39.9 35.1 - 43.9      NRBC 0 0 - 0      IMMATURE GRANULOCYTES 0.3 0.0 - 3.0 %    NEUTROPHILS 68.3 (H) 34 - 64 %    LYMPHOCYTES 15.2 (L) 28 - 48 %    MONOCYTES 13.5 (H) 1 - 13 %    EOSINOPHILS 2.4 0 - 5 %    BASOPHILS 0.3 0 - 3 %   METABOLIC PANEL, COMPREHENSIVE    Collection Time: 08/11/18  2:59 AM   Result Value Ref Range    Sodium 139 136 - 145 mEq/L    Potassium 2.9 (LL) 3.5 - 5.1 mEq/L    Chloride 113 (H) 98 - 107 mEq/L    CO2 17 (L) 21 - 32 mEq/L    Glucose 91 74 - 106 mg/dl    BUN 16 7 - 25 mg/dl    Creatinine 0.8 0.6 - 1.3 mg/dl    GFR est AA >60      GFR est non-AA >60      Calcium 8.0 (L) 8.5 - 10.1 mg/dl    AST (SGOT) 139 (H) 15 - 37 U/L    ALT (SGPT) 181 (H) 12 - 78 U/L    Alk. phosphatase 90 45 - 117 U/L    Bilirubin, total 0.3 0.2 - 1.0 mg/dl    Protein, total 7.5 6.4 - 8.2 gm/dl    Albumin 3.0 (L) 3.4 - 5.0 gm/dl    Anion gap 9 5 - 15 mmol/L       XR Results:  Results from Hospital Encounter encounter on 07/21/18   XR ABD (AP AND ERECT OR DECUB)    Narrative Clinical history: Abdominal pain, ileus    EXAMINATION:  Supine and upright projections of the abdomen 07/30/2018    FINDINGS:  Pharmacist, community  is noted in the ascending, transverse and portions of the descending colon  and rectosigmoid. No dilated loops of small or bowel. Multiple air-fluid levels  on the upright projection.      Impression IMPRESSION:  Ileus.         CT Results:  Results from Hospital Encounter encounter on 08/06/18   CT ABD PELV WO CONT    Narrative CT Abdomen and Pelvis without    Indication: Abdominal pain. Nausea, vomiting, diarrhea.    Comparison: 07/22/2018.    TECHNIQUE:   CT of the abdomen and pelvis WITHOUT intravenous contrast. Coronal and sagittal  reformations were obtained. Oral contrast was given.    All CT exams at this facility use one or more dose reduction techniques   including automatic exposure control, mA/kV adjustment per patient's size, or  iterative reconstruction technique. DICOM format imaged data is available to  non-affiliated external healthcare facilities or entities on a secure, media  free, reciprocal searchable basis with patient authorization for 12 months  following the date of the study.    DISCUSSION:    ABSENCE OF INTRAVENOUS CONTRAST DECREASES SENSITIVITY FOR DETECTION OF FOCAL  LESIONS AND VASCULAR PATHOLOGY.    LOWER THORAX: Normal.    HEPATOBILIARY: No focal hepatic lesions.  No biliary ductal dilatation.  SPLEEN: No splenomegaly.  PANCREAS: No focal masses or ductal dilatation.    ADRENALS: No adrenal nodules.  KIDNEYS/URETERS: No hydronephrosis, stones, or solid mass lesions.  PELVIC ORGANS/BLADDER: Unremarkable.    PERITONEUM / RETROPERITONEUM: No free air or fluid.  LYMPH NODES: No lymphadenopathy.  VESSELS: Unremarkable.    GI TRACT: Multiple nondistended fluid-filled loops of small bowel with  questionable mild wall thickening. Fluid-filled colon with no significant wall  thickening. No obstruction. Normal appendix.    BONES AND SOFT TISSUES: No acute abnormality.      Impression IMPRESSION:    Numerous fluid-filled loops of small and large bowel with borderline diffuse  small bowel wall thickening, concerning for infectious or inflammatory  enterocolitis. Findings have slightly progressed since 07/22/2018.         MRI Results:  No results found for this or any previous visit.    Nuclear Medicine Results:  No results found for this or any previous visit.    [place] Results:  Results from [place] encounter on 07/21/18   [place] RUQ    Narrative Clinical history: Elevated liver function studies    EXAMINATION:  Upper quadrant ultrasound 07/28/2018    FINDINGS:  Liver demonstrates homogeneous echotexture. No intrahepatic bile duct  dilatation. Bile duct measures 4 mm. No stones are seen in the gallbladder.   Gallbladder wall measures 2 mm. Sonographic Percell Miller sign is negative.    Pancreas, proximal aorta and IVC are within normal limits. Right kidney measures  10.4 x 5.6 x 3.7 cm. Normal corticomedullary differentiation. No hydronephrosis.  Small amounts of perihepatic ascites.      Impression IMPRESSION:  Small amounts of perihepatic ascites.         IR Results:  No results found for this or any previous visit.    VAS/US Results:  No results found for this or any previous visit.      Total clinical care time was  87mnutes of which more than 50% was spent in coordination of care and counseling (time spent with patient/family face to face, physical exam, reviewing laboratory and imaging investigations, speaking with physicians and nursing staff involved in this patient's care).     Alyrica Thurow,  MD  [facility]  August 11, 2018  Time: 10:15 AM

## 2018-08-11 NOTE — Progress Notes (Signed)
Results for PENROSE, ROTTA (MRN [id number]) as of 08/11/2018 13:31   Ref. Range 08/11/2018 12:38   Potassium Latest Ref Range: 3.5 - 5.1 mEq/L 3.4 (L)

## 2018-08-11 NOTE — Progress Notes (Signed)
Per charge nurse Armie, patient vomited moderate amount of yellow emesis, zofran given. Will continue to monitor.

## 2018-08-11 NOTE — Progress Notes (Signed)
Progress Notes by Everlene Farrier, MD at 08/11/18 1014                Author: Everlene Farrier, MD  Service: Hospitalist  Author Type: Physician       Filed: 08/11/18 1505  Date of Service: 08/11/18 1014  Status: Signed          Editor: Everlene Farrier, MD (Physician)                          INTERNAL MEDICINE PROGRESS NOTE      Date of note:      August 11, 2018      Patient:               Travis [name],  28 y.o., male   Admit Date:        08/06/2018   Length of Stay:  5 day(s)      Problem List:      Patient Active Problem List        Diagnosis  Code         ?  AKI (acute kidney injury) ([place])  N17.9     ?  Dehydration  E86.0     ?  Enterocolitis  K52.9         ?  Intractable nausea and vomiting  R11.2             Subjective + interval history        Had total of 3 BMs over the past 24 hours. Had one episode of vomiting this morning but has been able to eat almost 100% of his meals. Abdominal cramping is better        Assessment :     ??   Enterocolitis - stool culture positive for giardia, cryptococcus   New diagnosis of HIV   Dehydration   AKI present on admission   Metabolic acidosis   Hypertension   Asthma   History of Bell's palsy   Elevated LFTs - resolved   Moderate protein calorie malnutrition   Unintentional weight loss ~ 20 lbs in couple of months   ??     Plan :     ??   ID input appreciated. Nitazoxanide 1000 mg po BID  - discussed with Dr. Glendora Score who will kindly talk to pharmacy to arrange this medication at the time of discharge      Potassium is very low 2.9 - oral replacement ordered. K level rechecked at noon. Improved to 3.4       New diagnosis of HIV. ID on board. HIV RNA  1.15 million copies/ml. CD4 count is pending   ??   SCD and ambulation for DVT ppx ( borderline low platelet count, so not on heparin)   ??   I updated his mom at length on phone [phone]      Possible discharge tomorrow if continues to improve   ??   ??   - Code status: full code   ??   Recommend to continue  hospitalization.   Expected date of discharge: tbd   Plan for disposition - tbd        Objective:           Visit Vitals      BP  149/85 (BP 1 Location: Right arm, BP Patient Position: Supine)     Pulse  (!) 104     Temp  98.5 ??F (36.9 ??  C)     Resp  18     Ht  5' 7" (1.702 m)     Wt  72.3 kg (159 lb 6.3 oz)     SpO2  100%        BMI  24.96 kg/m??                    Intake/Output Summary (Last 24 hours) at 08/11/2018 1015   Last data filed at 08/11/2018 0304     Gross per 24 hour        Intake  903.75 ml        Output  1000 ml        Net  -96.25 ml             Physical Exam:     GEN - AAOx3, looks tired, but not in distress   HEENT - , mucous membranes moist   Neck - supple, no JVD   Cardiac - RRR, S1, S2, no murmurs   Chest/Lungs - clear to auscultation without wheezes or rhonchi   Abdomen - soft, mild generalized tenderness without guarding   Extremities - no clubbing/ cyanosis/ edema   Neuro - CN 2-12 intact. No focal deficits. No motor or sensory deficit appreciated.    Skin - no rashes or lesions        Current medications:           Current Facility-Administered Medications:    ?  potassium chloride (K-DUR, KLOR-CON) SR tablet 40 mEq, 40 mEq, Oral, BID, Rawad Bochicchio S, MD   ?  albuterol (PROVENTIL VENTOLIN) nebulizer solution 2.5 mg, 2.5 mg, Nebulization, Q4H PRN, Hutchinson, Anne, DO   ?  dicyclomine (BENTYL) capsule 10 mg, 10 mg, Oral, QID, Hutchinson, Anne, DO, 10 mg at 08/11/18 1610   ?  acetaminophen (TYLENOL) tablet 650 mg, 650 mg, Oral, Q4H PRN, 650 mg at 08/08/18 1418 **OR** acetaminophen (TYLENOL) solution 650 mg, 650 mg, Oral, Q4H PRN **OR** acetaminophen (TYLENOL) suppository 650 mg, 650 mg, Rectal, Q4H PRN, Hutchinson,  Anne, DO   ?  naloxone (NARCAN) injection 0.1 mg, 0.1 mg, IntraVENous, PRN, Hutchinson, Anne, DO   ?  dextrose 5% - 0.45% NaCl with KCl 20 mEq/L infusion, 75 mL/hr, IntraVENous, CONTINUOUS, Hutchinson, Anne, DO, Last Rate: 75 mL/hr at 08/10/18 0539, 75 mL/hr at 08/10/18 0539   ?   morphine injection 2 mg, 2 mg, IntraVENous, Q4H PRN, Hutchinson, Anne, DO, 2 mg at 08/07/18 1855   ?  ondansetron (ZOFRAN) injection 4 mg, 4 mg, IntraVENous, Q4H PRN, Hutchinson, Anne, DO, 4 mg at 08/11/18 0943        Labs:          Recent Results (from the past 24 hour(s))     CBC WITH AUTOMATED DIFF          Collection Time: 08/11/18  2:59 AM         Result  Value  Ref Range            WBC  6.2  4.0 - 11.0 1000/mm3       RBC  4.66  3.80 - 5.70 M/uL       HGB  13.1  12.4 - 17.2 gm/dl       HCT  37.5  37.0 - 50.0 %       MCV  80.5  80.0 - 98.0 fL       MCH  28.1  23.0 - 34.6 pg  MCHC  34.9  30.0 - 36.0 gm/dl       PLATELET  137 (L)  140 - 450 1000/mm3       MPV  13.5 (H)  6.0 - 10.0 fL       RDW-SD  39.9  35.1 - 43.9         NRBC  0  0 - 0         IMMATURE GRANULOCYTES  0.3  0.0 - 3.0 %       NEUTROPHILS  68.3 (H)  34 - 64 %       LYMPHOCYTES  15.2 (L)  28 - 48 %       MONOCYTES  13.5 (H)  1 - 13 %       EOSINOPHILS  2.4  0 - 5 %       BASOPHILS  0.3  0 - 3 %       METABOLIC PANEL, COMPREHENSIVE          Collection Time: 08/11/18  2:59 AM         Result  Value  Ref Range            Sodium  139  136 - 145 mEq/L       Potassium  2.9 (LL)  3.5 - 5.1 mEq/L       Chloride  113 (H)  98 - 107 mEq/L       CO2  17 (L)  21 - 32 mEq/L       Glucose  91  74 - 106 mg/dl       BUN  16  7 - 25 mg/dl       Creatinine  0.8  0.6 - 1.3 mg/dl       GFR est AA  >60          GFR est non-AA  >60          Calcium  8.0 (L)  8.5 - 10.1 mg/dl       AST (SGOT)  139 (H)  15 - 37 U/L       ALT (SGPT)  181 (H)  12 - 78 U/L       Alk. phosphatase  90  45 - 117 U/L       Bilirubin, total  0.3  0.2 - 1.0 mg/dl       Protein, total  7.5  6.4 - 8.2 gm/dl       Albumin  3.0 (L)  3.4 - 5.0 gm/dl            Anion gap  9  5 - 15 mmol/L           XR Results:     Results from Hospital Encounter encounter on 07/21/18     XR ABD (AP AND ERECT OR DECUB)           Narrative  Clinical history: Abdominal pain, ileus      EXAMINATION:   Supine and upright  projections of the abdomen 07/30/2018      FINDINGS:   Air is noted in the ascending, transverse and portions of the descending colon   and rectosigmoid. No dilated loops of small or bowel. Multiple air-fluid levels   on the upright projection.              Impression  IMPRESSION:   Ileus.              CT Results:  Results from [place] encounter on 08/06/18     CT ABD PELV WO CONT           Narrative  CT Abdomen and Pelvis without      Indication: Abdominal pain. Nausea, vomiting, diarrhea.      Comparison: 07/22/2018.      TECHNIQUE:    CT of the abdomen and pelvis WITHOUT intravenous contrast. Coronal and sagittal   reformations were obtained. Oral contrast was given.      All CT exams at this facility use one or more dose reduction techniques   including automatic exposure control, mA/kV adjustment per patient's size, or   iterative reconstruction technique. DICOM format imaged data is available to   non-affiliated external healthcare facilities or entities on a secure, media   free, reciprocal searchable basis with patient authorization for 12 months   following the date of the study.      DISCUSSION:      ABSENCE OF INTRAVENOUS CONTRAST DECREASES SENSITIVITY FOR DETECTION OF FOCAL   LESIONS AND VASCULAR PATHOLOGY.      LOWER THORAX: Normal.      HEPATOBILIARY: No focal hepatic lesions.  No biliary ductal dilatation.   SPLEEN: No splenomegaly.   PANCREAS: No focal masses or ductal dilatation.      ADRENALS: No adrenal nodules.   KIDNEYS/URETERS: No hydronephrosis, stones, or solid mass lesions.   PELVIC ORGANS/BLADDER: Unremarkable.      PERITONEUM / RETROPERITONEUM: No free air or fluid.   LYMPH NODES: No lymphadenopathy.   VESSELS: Unremarkable.      GI TRACT: Multiple nondistended fluid-filled loops of small bowel with   questionable mild wall thickening. Fluid-filled colon with no significant wall   thickening. No obstruction. Normal appendix.      BONES AND SOFT TISSUES: No acute abnormality.               Impression  IMPRESSION:      Numerous fluid-filled loops of small and large bowel with borderline diffuse   small bowel wall thickening, concerning for infectious or inflammatory   enterocolitis. Findings have slightly progressed since 07/22/2018.              MRI Results:   No results found for this or any previous visit.      Nuclear Medicine Results:   No results found for this or any previous visit.      [place] Results:     Results from [place] encounter on 07/21/18     [place] RUQ           Narrative  Clinical history: Elevated liver function studies      EXAMINATION:   Upper quadrant ultrasound 07/28/2018      FINDINGS:   Liver demonstrates homogeneous echotexture. No intrahepatic bile duct   dilatation. Bile duct measures 4 mm. No stones are seen in the gallbladder.   Gallbladder wall measures 2 mm. Sonographic Percell Miller sign is negative.      Pancreas, proximal aorta and IVC are within normal limits. Right kidney measures   10.4 x 5.6 x 3.7 cm. Normal corticomedullary differentiation. No hydronephrosis.   Small amounts of perihepatic ascites.              Impression  IMPRESSION:   Small amounts of perihepatic ascites.              IR Results:   No results found for this or any previous visit.      VAS/US Results:  No results found for this or any previous visit.         Total clinical care time was  63mnutes of which more than 50% was spent in coordination of care and counseling (time spent with patient/family face to face, physical exam, reviewing laboratory and imaging investigations, speaking with physicians and  nursing staff involved in this patient's care).       RPerfecto Kingdom  MD   [facility]   August 11, 2018   Time: 10:15 AM

## 2018-08-11 NOTE — Progress Notes (Signed)
08/11/18 1144   Critical Result Types   Type of Critical Result Laboratory   Critical Lab Result Types   Critical Lab Value Electrolytes   Potassium Value 9   Notification Information   Notified By (Name) alex   Time of Critical Result Notification 1145   Verbal Readback Provided Yes   Provider Notification   Was Provider Notified Yes   Name of Provider Simoes   Time Provider Notified 1145   Date Provider Notified 08/11/18

## 2018-08-11 NOTE — Progress Notes (Signed)
[facility] ID: [id number] >MESSAGE [phone], Mr. [place]. Alex from Laboratory called with a critical potassium result of 9. please advise. Lester Carolina, RN #[id number]

## 2018-08-11 NOTE — Progress Notes (Signed)
[facility] ID: [phone]   MESSAGE: [phone] Travis [name], Travis [name]. DX Enterocolitis/dehydration. Critical lab Potassium 2.9. Travis [name] [phone]      Awaiting MD call back.     [phone] Dr Langston Masker called back with new order Potassium 40 meq PO x1 dose. Repeat lab at 1100. Will continue to monitor

## 2018-08-11 NOTE — Progress Notes (Signed)
New orders for potassium redraw. Lab tech please hold potassium IV fluids before drawing and do not take blood from same arm as IV fluids. Thanks.

## 2018-08-11 NOTE — Progress Notes (Signed)
08/11/18 0515   Critical Result Types   Type of Critical Result Laboratory   Critical Lab Result Types   Critical Lab Value Electrolytes   Potassium Value 2.9   Notification Information   Notified By (Name) Dwain Sarna   Time of Critical Result Notification [phone]   Verbal Readback Provided Yes   Provider Notification   Was Provider Notified Yes

## 2018-08-11 NOTE — Progress Notes (Signed)
Progress  Notes by Alfonso EllisMooney, Suzzanne Brunkhorst, MD at 08/11/18 [phone]                Author: Alfonso EllisMooney, Mandisa Persinger, MD  Service: Infectious Disease  Author Type: Physician       Filed: 08/11/18 1431  Date of Service: 08/11/18 0909  Status: Signed          Editor: Alfonso EllisMooney, Clevon Khader, MD (Physician)                                                                                       INFECTIOUS DISEASE PROGRESS    NOTE          Requested by: Dr  [name]      Reason for consult: chronic diarrhea      Date of admission: 08/06/2018              ABX:           Current abx  Prior abx         nitazoxinide 6/13  2            ASSESSMENT:            cryptosporidia/ Giardia concomitant infection    source unknown        HIV- new diagnosis     VL > 1 mil     cd4 IP        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        neutropenia with adequate anc                         nkda                RECOMMENDATIONS:        1- micro  repeated the testing on the stool that resulted in BOTH + for giardia AND crypto- possible- with exposure to contaminated source water,  Again +   HIV testing + HIV An, pcr, cd4 IP, genomic testing for resistance ordered      Rx  With nitazoxanide 1000 mg po bid till dc , then 500 mg bid to complete    x 14 - 28 d   Monitor stool count   HIV AN +   CD4, VL IP   Will obtain genomic testing - for appropriate HAART  determination - sent out 6/15-IP   HIV baseline labs- after review of [facility] w/u- to avoid duplication    update vaccines when CD4 adequate- when out pt with other ID provider   Pt will need to be set up with long term provider for management as outpt for this- [facility] pt in detail- [facility] is best choice      He is to see [facility] as new pt for his HIV management and [facility] can folow him on the nitazoxinide- he will need 2-4 weeks      Would like to have the presscrition filled for 500 mg bid to complete 14 d prior to leaving [facility]- as outside pharmacies may not carry. [facility]- appreciate the assist      [facility] Dr  Simoes                            MICROBIOLOGY:           5/27  Neg c diff   6/2    Stool culture neg   6/12  + for BOTH giardia and cryptosporidia-- lab repeated test with same results     LINES AND CATHETERS:     PIV        CC      better today..  Eating. One episode of vomiting this am- feels better.  No other co        Past medical history:          Past Medical History:        Diagnosis  Date         ?  Asthma       ?  Hypertension       ?  Neurological disorder            Bell's palsy           History reviewed. No pertinent surgical history.                  Current Medications:          Current Facility-Administered Medications          Medication  Dose  Route  Frequency           ?  nitazoxanide (ALINIA) tablet 1,000 mg   1,000 mg  Oral  BID WITH MEALS     ?  albuterol (PROVENTIL VENTOLIN) nebulizer solution 2.5 mg   2.5 mg  Nebulization  Q4H PRN           ?  dicyclomine (BENTYL) capsule 10 mg   10 mg  Oral  QID           ?  acetaminophen (TYLENOL) tablet 650 mg   650 mg  Oral  Q4H PRN          Or           ?  acetaminophen (TYLENOL) solution 650 mg   650 mg  Oral  Q4H PRN          Or           ?  acetaminophen (TYLENOL) suppository 650 mg   650 mg  Rectal  Q4H PRN     ?  naloxone (NARCAN) injection 0.1 mg   0.1 mg  IntraVENous  PRN     ?  dextrose 5% - 0.45% NaCl with KCl 20 mEq/L infusion   75 mL/hr  IntraVENous  CONTINUOUS     ?  morphine injection 2 mg   2 mg  IntraVENous  Q4H PRN           ?  ondansetron (ZOFRAN) injection 4 mg   4 mg  IntraVENous  Q4H PRN              Physical Exam:   Vitals   Temp (24hrs), Avg:98.6 ??F (37 ??C), Min:98.1 ??F (36.7 ??C), Max:99.8 ??F (37.7 ??C)      Visit Vitals      BP  149/85 (BP 1 Location: Right arm, BP Patient Position: Supine)     Pulse  (!) 104     Temp  98.5 ??F (36.9 ??C)     Resp  18     Ht  5\' 7"  (1.702  m)     Wt  72.3 kg (159 lb 6.3 oz)     SpO2  100%        BMI  24.96 kg/m??           General: slender, 28 y.o. year-old, male , in no acute distress   HEENT: Normocephalic,  anicteric sclerae, Pupils equal, round reactive to light, no oropharyngeal lesions. No sinus tenderness. Dentition in mediocre repair   Neck: Supple, no lymphadenopathy, masses or thyromegaly   Chest: Symmetrical expansion   Lungs: Clear to auscultation bilaterally, no dullness   Heart: Regular rhythm, no murmur, no rub or gallop, No JVD   Abdomen: Soft, some TTP, no rebound.slightly distended, no organomegaly, BS+   Musculoskeletal: Normal strength/tone. No edema. No clubbing or cyanosis   CNS: AAOx3. Cranial nerves II-XII intact. Grossly normal.No NR   SKIN: No skin lesion or rash. Dry, warm, intact                 Labs:  Results:        Chemistry  Recent Labs         08/11/18   0259      GLU  91      NA  139      K  2.9*      CL  113*      CO2  17*      BUN  16      CREA  0.8      CA  8.0*      AGAP  9      AP  90      TP  7.5      ALB  3.0*              CBC w/Diff  Recent Labs         08/11/18   0259  08/10/18   0312  08/09/18   1253      WBC  6.2  4.7  3.1*      RBC  4.66  4.56  4.55      HGB  13.1  12.9  12.9      HCT  37.5  36.4*  36.3*      PLT  137*  122*  113*      GRANS  68.3*  68.2*  55.4      LYMPH  15.2*  11.9*  20.4*      EOS  2.4  2.5  1.3              Microbiology  No results for input(s): CULT in the last 72 hours.           Imaging-        Results from Hospital Encounter encounter on 07/21/18     XR ABD (AP AND ERECT OR DECUB)           Narrative  Clinical history: Abdominal pain, ileus      EXAMINATION:   Supine and upright projections of the abdomen 07/30/2018      FINDINGS:   Air is noted in the ascending, transverse and portions of the descending colon   and rectosigmoid. No dilated loops of small or bowel. Multiple air-fluid levels   on the upright projection.              Impression  IMPRESSION:   Ileus.                Results  from Hospital Encounter encounter on 08/06/18     CT ABD PELV WO CONT           Narrative  CT Abdomen and Pelvis without      Indication: Abdominal pain. Nausea,  vomiting, diarrhea.      Comparison: 07/22/2018.      TECHNIQUE:    CT of the abdomen and pelvis WITHOUT intravenous contrast. Coronal and sagittal   reformations were obtained. Oral contrast was given.      All CT exams at this facility use one or more dose reduction techniques   including automatic exposure control, mA/kV adjustment per patient's size, or   iterative reconstruction technique. DICOM format imaged data is available to   non-affiliated external healthcare facilities or entities on a secure, media   free, reciprocal searchable basis with patient authorization for 12 months   following the date of the study.      DISCUSSION:      ABSENCE OF INTRAVENOUS CONTRAST DECREASES SENSITIVITY FOR DETECTION OF FOCAL   LESIONS AND VASCULAR PATHOLOGY.      LOWER THORAX: Normal.      HEPATOBILIARY: No focal hepatic lesions.  No biliary ductal dilatation.   SPLEEN: No splenomegaly.   PANCREAS: No focal masses or ductal dilatation.      ADRENALS: No adrenal nodules.   KIDNEYS/URETERS: No hydronephrosis, stones, or solid mass lesions.   PELVIC ORGANS/BLADDER: Unremarkable.      PERITONEUM / RETROPERITONEUM: No free air or fluid.   LYMPH NODES: No lymphadenopathy.   VESSELS: Unremarkable.      GI TRACT: Multiple nondistended fluid-filled loops of small bowel with   questionable mild wall thickening. Fluid-filled colon with no significant wall   thickening. No obstruction. Normal appendix.      BONES AND SOFT TISSUES: No acute abnormality.              Impression  IMPRESSION:      Numerous fluid-filled loops of small and large bowel with borderline diffuse   small bowel wall thickening, concerning for infectious or inflammatory   enterocolitis. Findings have slightly progressed since 07/22/2018.              ---------------------------------------------------------------------------------------------------------------   I have independently examined the patient and reviewed all lab studies and imgaing as well as review of  nursing notes and physican notes          Alfonso EllisMartha Naika Noto, MD   08/11/2018      [facility]   [phone]

## 2018-08-12 LAB — CBC WITH AUTO DIFFERENTIAL
Basophils %: 0.3 % (ref 0–3)
Eosinophils %: 2.6 % (ref 0–5)
Hematocrit: 38.1 % (ref 37.0–50.0)
Hemoglobin: 13.1 gm/dl (ref 12.4–17.2)
Immature Granulocytes: 0.5 % (ref 0.0–3.0)
Lymphocytes %: 14.6 % — ABNORMAL LOW (ref 28–48)
MCH: 28.1 pg (ref 23.0–34.6)
MCHC: 34.4 gm/dl (ref 30.0–36.0)
MCV: 81.8 fL (ref 80.0–98.0)
MPV: 12.7 fL — ABNORMAL HIGH (ref 6.0–10.0)
Monocytes %: 16.7 % — ABNORMAL HIGH (ref 1–13)
Neutrophils %: 65.3 % — ABNORMAL HIGH (ref 34–64)
Nucleated RBCs: 0 (ref 0–0)
Platelets: 165 10*3/uL (ref 140–450)
RBC: 4.66 M/uL (ref 3.80–5.70)
RDW-SD: 40.9 (ref 35.1–43.9)
WBC: 5.8 10*3/uL (ref 4.0–11.0)

## 2018-08-12 LAB — BASIC METABOLIC PANEL
Anion Gap: 7 mmol/L (ref 5–15)
BUN: 13 mg/dl (ref 7–25)
CO2: 17 mEq/L — ABNORMAL LOW (ref 21–32)
Calcium: 7.9 mg/dl — ABNORMAL LOW (ref 8.5–10.1)
Chloride: 118 mEq/L — ABNORMAL HIGH (ref 98–107)
Creatinine: 1 mg/dl (ref 0.6–1.3)
EGFR IF NonAfrican American: >60
GFR African American: >60
Glucose: 83 mg/dl (ref 74–106)
Potassium: 3.3 mEq/L — ABNORMAL LOW (ref 3.5–5.1)
Sodium: 143 mEq/L (ref 136–145)

## 2018-08-12 LAB — LYMPHOCYTES, CD4 PERCENT AND ABSOLUTE
% CD4:: 2 % — ABNORMAL LOW (ref 30–61)
% CD4:: 2 % — ABNORMAL LOW (ref 30–61)
ABSOLUTE CD4+ CELLS, 40049500: 17 cells/uL — ABNORMAL LOW (ref 490–1740)
ABSOLUTE LYMPHOCYTES, 40050410: 771 cells/uL — ABNORMAL LOW (ref 850–3900)
Absolute CD4+ cells: 17 cells/uL — ABNORMAL LOW (ref 490–1740)
Absolute lymphocytes: 771 cells/uL — ABNORMAL LOW (ref 850–3900)

## 2018-08-12 LAB — CBC WITH AUTOMATED DIFF
BASOPHILS: 0.3 % (ref 0–3)
EOSINOPHILS: 2.6 % (ref 0–5)
HCT: 38.1 % (ref 37.0–50.0)
HGB: 13.1 gm/dl (ref 12.4–17.2)
IMMATURE GRANULOCYTES: 0.5 % (ref 0.0–3.0)
LYMPHOCYTES: 14.6 % — ABNORMAL LOW (ref 28–48)
MCH: 28.1 pg (ref 23.0–34.6)
MCHC: 34.4 gm/dl (ref 30.0–36.0)
MCV: 81.8 fL (ref 80.0–98.0)
MONOCYTES: 16.7 % — ABNORMAL HIGH (ref 1–13)
MPV: 12.7 fL — ABNORMAL HIGH (ref 6.0–10.0)
NEUTROPHILS: 65.3 % — ABNORMAL HIGH (ref 34–64)
NRBC: 0 (ref 0–0)
PLATELET: 165 10*3/uL (ref 140–450)
RBC: 4.66 M/uL (ref 3.80–5.70)
RDW-SD: 40.9 (ref 35.1–43.9)
WBC: 5.8 10*3/uL (ref 4.0–11.0)

## 2018-08-12 LAB — METABOLIC PANEL, BASIC
Anion gap: 7 mmol/L (ref 5–15)
BUN: 13 mg/dl (ref 7–25)
CO2: 17 mEq/L — ABNORMAL LOW (ref 21–32)
Calcium: 7.9 mg/dl — ABNORMAL LOW (ref 8.5–10.1)
Chloride: 118 mEq/L — ABNORMAL HIGH (ref 98–107)
Creatinine: 1 mg/dl (ref 0.6–1.3)
GFR est AA: >60
GFR est non-AA: >60
Glucose: 83 mg/dl (ref 74–106)
Potassium: 3.3 mEq/L — ABNORMAL LOW (ref 3.5–5.1)
Sodium: 143 mEq/L (ref 136–145)

## 2018-08-12 MED ORDER — TRIMETHOPRIM-SULFAMETHOXAZOLE 160 MG-800 MG TAB
160-800 mg | ORAL_TABLET | Freq: Every day | ORAL | 1 refills | Status: DC
Start: 2018-08-12 — End: 2018-08-26

## 2018-08-12 MED ORDER — AZITHROMYCIN 600 MG TAB
600 mg | ORAL_TABLET | ORAL | 1 refills | Status: DC
Start: 2018-08-12 — End: 2018-08-26

## 2018-08-12 MED ORDER — POTASSIUM CHLORIDE SR 20 MEQ TAB, PARTICLES/CRYSTALS
20 mEq | ORAL_TABLET | Freq: Two times a day (BID) | ORAL | 0 refills | Status: DC
Start: 2018-08-12 — End: 2018-08-26

## 2018-08-12 MED ORDER — DICYCLOMINE 10 MG CAP
10 mg | ORAL_CAPSULE | Freq: Four times a day (QID) | ORAL | 0 refills | Status: DC | PRN
Start: 2018-08-12 — End: 2018-08-26

## 2018-08-12 MED ORDER — ONDANSETRON 4 MG TAB, RAPID DISSOLVE
4 mg | ORAL_TABLET | Freq: Three times a day (TID) | ORAL | 0 refills | Status: DC | PRN
Start: 2018-08-12 — End: 2018-08-26

## 2018-08-12 MED FILL — ALINIA 500 MG TABLET: 500 mg | ORAL | Qty: 2

## 2018-08-12 MED FILL — POTASSIUM CHLORIDE SR 20 MEQ TAB, PARTICLES/CRYSTALS: 20 mEq | ORAL | Qty: 2

## 2018-08-12 MED FILL — DICYCLOMINE 10 MG CAP: 10 mg | ORAL | Qty: 1

## 2018-08-12 NOTE — Other (Addendum)
----------  DocumentID: [id number]------------------------------------------------              [facility]                       Patient Education Report         Name: SAILOR, HAUGHN                  Date: 08/06/2018    MRN: [id number]                    Time: 5:01:14 PM         Patient ordered video: 'Patient Safety: Stay Safe While you are in the Hospital'    from [id number] via phone number: [phone] at 5:01:14 PM    Description: This program outlines some of the precautions patients can take to ensure a speedy recovery without extra complications. The video emphasizes the importance of communicating with the healthcare team.    ----------DocumentID: [id number]------------------------------------------------                       [facility]          Patient Education Report - Discharge Summary        Date: 08/12/2018   Time: 3:52:25 PM   Name: VEDANTH, SIRICO   MRN: [id number]      Account Number: [id number]      Education History:        Patient ordered video: 'Patient Safety: Stay Safe While you are in the Hospital' from [id number] on 08/06/2018 05:01:14 PM

## 2018-08-12 NOTE — Progress Notes (Signed)
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    neutropenia with adequate anc             nkda         RECOMMENDATIONS:     1- micro  repeated the testing on the stool that resulted in BOTH + for giardia AND crypto- possible- with exposure to contaminated source water,  Again +  HIV testing + HIV An, pcr, cd4 IP, genomic testing for resistance ordered    Rx  With nitazoxanide 1000 mg po bid till dc , then 500 mg bid to complete    x 14 - 28 d  Monitor stool count  HIV AN +  CD4, VL IP  Will obtain genomic testing - for appropriate HAART  determination - sent out 6/15-IP  HIV baseline labs- after review of [facility] w/u- to avoid duplication   update vaccines when CD4 adequate- when out pt with other ID provider  Pt will need to be set up with long term provider for management as outpt for this- [facility] pt in detail- [facility] is best choice    He is to see [facility] as new pt for his HIV management and [facility] can folow him on the nitazoxinide- he will need 2-4 weeks    Would like to have the presscrition filled for 500 mg bid to complete 14 d prior to leaving [facility]- as outside pharmacies may not carry. [facility]- appreciate the assist  Spoke this am- med is here  Ok to dc from ID standpint    [facility] Dr Sandie AnoSimoes                     MICROBIOLOGY:       5/27  Neg c diff  6/2    Stool culture neg  6/12  + for BOTH giardia and cryptosporidia-- lab repeated test with same results  LINES AND CATHETERS:   PIV    CC    better today..     Past medical history:     Past Medical History:   Diagnosis Date   ??? Asthma    ??? Hypertension     ??? Neurological disorder     Bell's palsy       History reviewed. No pertinent surgical history.           Current Medications:     Current Facility-Administered Medications   Medication Dose Route Frequency   ??? potassium chloride (K-DUR, KLOR-CON) SR tablet 40 mEq  40 mEq Oral BID   ??? nitazoxanide (ALINIA) tablet 1,000 mg  1,000 mg Oral BID   ??? albuterol (PROVENTIL VENTOLIN) nebulizer solution 2.5 mg  2.5 mg Nebulization Q4H PRN   ??? dicyclomine (BENTYL) capsule 10 mg  10 mg Oral QID   ??? acetaminophen (TYLENOL) tablet 650 mg  650 mg Oral Q4H PRN    Or   ??? acetaminophen (TYLENOL) solution  650 mg  650 mg Oral Q4H PRN    Or   ??? acetaminophen (TYLENOL) suppository 650 mg  650 mg Rectal Q4H PRN   ??? naloxone (NARCAN) injection 0.1 mg  0.1 mg IntraVENous PRN   ??? morphine injection 2 mg  2 mg IntraVENous Q4H PRN   ??? ondansetron (ZOFRAN) injection 4 mg  4 mg IntraVENous Q4H PRN         Physical Exam:  Vitals  Temp (24hrs), Avg:98.7 ??F (37.1 ??C), Min:97.8 ??F (36.6 ??C), Max:99.4 ??F (37.4 ??C)    Visit Vitals  BP 120/66 (BP Patient Position: Supine)   Pulse (!) 101   Temp 99.3 ??F (37.4 ??C)   Resp 18   Ht 5\' 7"  (1.702 m)   Wt 72.2 kg (159 lb 2.8 oz)   SpO2 100%   BMI 24.93 kg/m??             Labs: Results:   Chemistry Recent Labs     08/12/18  0241 08/11/18  1238 08/11/18  1029 08/11/18  0259   GLU 83  --   --  91   NA 143  --   --  139   K 3.3* 3.4* 9.1* 2.9*   CL 118*  --   --  113*   CO2 17*  --   --  17*   BUN 13  --   --  16   CREA 1.0  --   --  0.8   CA 7.9*  --   --  8.0*   AGAP 7  --   --  9   AP  --   --   --  90   TP  --   --   --  7.5   ALB  --   --   --  3.0*      CBC w/Diff Recent Labs     08/12/18  0241 08/11/18  0259 08/10/18  0312   WBC 5.8 6.2 4.7   RBC 4.66 4.66 4.56   HGB 13.1 13.1 12.9   HCT 38.1 37.5 36.4*   PLT 165 137* 122*   GRANS 65.3* 68.3* 68.2*   LYMPH 14.6* 15.2* 11.9*   EOS 2.6 2.4 2.5      Microbiology No results for input(s): CULT in the last 72 hours.       Imaging-     Results from Hospital Encounter encounter on 07/21/18   XR ABD (AP AND ERECT OR DECUB)    Narrative Clinical history: Abdominal pain, ileus    EXAMINATION:  Supine and upright projections of the abdomen 07/30/2018    FINDINGS:  Air is noted in the ascending, transverse and portions of the descending colon  and rectosigmoid. No dilated loops of small or bowel. Multiple air-fluid levels  on the upright projection.      Impression IMPRESSION:  Ileus.         Results from Hospital Encounter encounter on 08/06/18   CT ABD PELV WO CONT    Narrative CT Abdomen and Pelvis without    Indication: Abdominal pain. Nausea, vomiting, diarrhea.    Comparison: 07/22/2018.    TECHNIQUE:   CT of the abdomen and pelvis WITHOUT intravenous contrast. Coronal and sagittal  reformations were obtained. Oral contrast was given.    All CT exams at this facility use one or more dose reduction techniques  including automatic exposure control, mA/kV adjustment per patient's size, or  iterative reconstruction technique. DICOM format imaged  data is available to  non-affiliated external healthcare facilities or entities on a secure, media  free, reciprocal searchable basis with patient authorization for 12 months  following the date of the study.    DISCUSSION:    ABSENCE OF INTRAVENOUS CONTRAST DECREASES SENSITIVITY FOR DETECTION OF FOCAL  LESIONS AND VASCULAR PATHOLOGY.    LOWER THORAX: Normal.    HEPATOBILIARY: No focal hepatic lesions.  No biliary ductal dilatation.  SPLEEN: No splenomegaly.  PANCREAS: No focal masses or ductal dilatation.    ADRENALS: No adrenal nodules.  KIDNEYS/URETERS: No hydronephrosis, stones, or solid mass lesions.  PELVIC ORGANS/BLADDER: Unremarkable.    PERITONEUM / RETROPERITONEUM: No free air or fluid.  LYMPH NODES: No lymphadenopathy.  VESSELS: Unremarkable.    GI TRACT: Multiple nondistended fluid-filled loops of small bowel with  questionable mild wall thickening. Fluid-filled colon with no significant wall   thickening. No obstruction. Normal appendix.    BONES AND SOFT TISSUES: No acute abnormality.      Impression IMPRESSION:    Numerous fluid-filled loops of small and large bowel with borderline diffuse  small bowel wall thickening, concerning for infectious or inflammatory  enterocolitis. Findings have slightly progressed since 07/22/2018.         ---------------------------------------------------------------------------------------------------------------  I have independently examined the patient and reviewed all lab studies and imgaing as well as review of nursing notes and physican notes       Jearld Adjutant, MD  08/12/2018    [facility]  [phone]

## 2018-08-12 NOTE — Progress Notes (Signed)
Waiting for client to stablize anticapated d/c soon. Request for records have been sent to [facility]. I have not seen the records in clients chart at this time. At d/c client will need a specific med that dr mooney has been working on with pharmacy. Clients follow up for new hiv and cont medication support will be thru e.v.m.s..

## 2018-08-12 NOTE — Progress Notes (Signed)
ADULT MALNUTRITION GUIDELINES    Patient is a 28 y.o. male, admitted on 08/06/2018 with a diagnosis of Enterocolitis [K52.9]  Dehydration [E86.0]  Enterocolitis [K52.9]  AKI (acute kidney injury) ([place]) [N17.9]  Intractable nausea and vomiting [R11.2].  Nutrition assessment was completed by RD and the patient was found to meet the following malnutrition criteria established by ASPEN/AND:    Adult Malnutrition Guidelines:  SEVERE PROTEIN CALORIE MALNUTRITION IN THE CONTEXT OF ACUTE INJURY/ILLNESS  Weight loss: 5.4% x 3 weeks  Energy intake: <75% energy intake compared to estimated energy needs >7 days  Body fat: mild depletion  Muscle mass: mild depletion     Please document Severe Protein Calorie Malnutrition in Problem List if in agreement    NUTRITION RECOMMENDATIONS:   Regular diet, discontinued Ensure Clear per request, added Ensure Pudding once daily (170 kcals, 4 gm protein)   Dislikes Ensure shakes    NUTRITION INITIAL EVALUATION    NUTRITION ASSESSMENT:       Reason for assessment: LOS     Admitting diagnosis: Enterocolitis [K52.9]  Dehydration [E86.0]  Enterocolitis [K52.9]  AKI (acute kidney injury) ([place]) [N17.9]  Intractable nausea and vomiting [R11.2]     PMH:   Past Medical History:   Diagnosis Date   ??? Asthma    ??? Hypertension    ??? Neurological disorder     Bell's palsy       Current pertinent medications: KCl, alinea, bentyl     Pertinent labs: 6/17 - K+ 3.3 (on KCl), pert labs reviewed        Anthropometrics & Physical Assessment:   Height:   Ht Readings from Last 3 Encounters:   08/06/18 5\' 7"  (1.702 m)   07/21/18 5\' 9"  (1.753 m)   04/10/18 5\' 8"  (1.727 m)      Weight:   Wt Readings from Last 3 Encounters:   08/12/18 72.2 kg (159 lb 2.8 oz)   07/29/18 71 kg (156 lb 8.4 oz)   04/10/18 85.7 kg (189 lb)         BMI: Body mass index is 24.93 kg/m??.    IBW: Ideal body weight: 66.1 kg (145 lb 11.6 oz)           UBW: 168# per patient      Wt change: ~5.4% wt loss x 3 weeks due to acute illness. Pt also reports losing weight over past 2-3 years (went from 270# down to 168#); however, this was intentional ( ~38% wt loss x 2-3 years)          [x]  significant       []  not significant         []  intended         [x]  not intended    GI symptoms/issues:       Adm with enterocolitis, denies nausea/vomiting, denies abd pain at this time   Last Bowel Movement Date: 08/11/18  Stool Appearance: Loose  Abdominal Assessment: Soft  Bowel Sounds: Active     Chewing/swallowing issues:   None     Skin integrity:   Intact        ?? Muscle wasting:          ?? Temporal: mild  ?? Clavicle: mild  ?? Other: n/a    ?? Fat wasting:  ?? Suborbital: mild  ?? Other: n/a                 Fluid accumulation:    None  Diet and intake history:   ?? Current diet order: DIET REGULAR  ?? DIET NUTRITIONAL SUPPLEMENTS Dinner; Ensure Pudding    ?? Food allergies: NKFA    ?? Diet/intake history: eats 2 larger meals per day plus 3 smaller meals per day. PO intake decreased x 3 weeks due to acute enterocolitis. GI sxs now resolved per patient. Is a "slow eater," may eat part of meal, then go back to eating after quite some time later. Pt dislikes taste of Ensure Clear and Ensure Enlive shakes; however, willing to try pudding. Also trying to avoid dairy, so refusing Magic Cup option.            []  <50% intake x >5 days         []  <50% intake x >1 month         [x]  <75% intake x >7 days         []  <75% intake x 1 month         []  <75% intake x 3 months    ?? Current appetite/PO intake:    Fair po intake per pt report     Assessment of Current MNT:   Diet appropriate, adequate to meet nutrition needs if po >75% with meals. Discontinued Ensure Clear per request, willing to try Ensure Pudding to increase calorie/pro intake opportunity.       Estimated daily nutrition intake needs:  ?? 1800 - 2200 kcals (25-30 kcals/kg)    ?? 85 - 100 g protein (1.2-1.4 gm/kg)    ?? 2200 mL fluid (30 ml/kg)     NUTRITION DIAGNOSIS:     1. Severe acute protein calorie malnutrition related to enterocolitis/acute illness as evidenced by po intake <75% x > 7 days, wt loss of 5.4% x 3 weeks, mild physical wasting.        NUTRITION INTERVENTION / RECOMMENDATIONS:     Regular diet, discontinued Ensure Clear per request, added Ensure Pudding once daily (170 kcals, 4 gm protein)   Dislikes Ensure shakes    NUTRITION MONITORING AND EVALUATION:     Nutrition level of care:  []  low       []  moderate      [x]  high    Nutrition monitoring: PO intake, diet tolerance and compliance, wt, BS, CMP, hydration, medical changes    Nutrition goals:  PO intake >75%, nutrition related labs WNL, dry wt stable during LOS, maintain skin integrity     Kaylyn Layeruth Osborne, RD  08/12/18

## 2018-08-12 NOTE — Other (Signed)
Bedside and Verbal shift change report given to kim (oncoming nurse) by sheerie (offgoing nurse). Report included the following information SBAR, Kardex, Intake/Output, MAR and Recent Results.

## 2018-08-12 NOTE — Progress Notes (Signed)
Problem: Falls - Risk of  Goal: *Absence of Falls  Description: Document Schmid Fall Risk and appropriate interventions in the flowsheet.  Outcome: Progressing Towards Goal  Note: Fall Risk Interventions:  Mobility Interventions: Bed/chair exit alarm, Patient to call before getting OOB         Medication Interventions: Bed/chair exit alarm, Patient to call before getting OOB    Elimination Interventions: Bed/chair exit alarm, Call light in reach

## 2018-08-12 NOTE — Discharge Summary (Addendum)
Discharge Summary           Patient ID:  Travis [name], 28 y.o., male  DOB: Jun 11, 1990    Admit Date: 08/06/2018  Discharge Date:  08/12/2018  Length of stay: 6 day(s)    PCP:  Janell Quiet, MD    Chief Complaint   Patient presents with   ??? Abdominal Pain   ??? Vomiting   ??? Diarrhea       Hospital Problems  Date Reviewed: 05-13-2014          Codes Class Noted POA    Dehydration ICD-10-CM: E86.0  ICD-9-CM: 276.51  08/06/2018 Unknown        Enterocolitis ICD-10-CM: K52.9  ICD-9-CM: 558.9  08/06/2018 Unknown        Intractable nausea and vomiting ICD-10-CM: R11.2  ICD-9-CM: 536.2  08/06/2018 Unknown        AKI (acute kidney injury) ([place]) ICD-10-CM: N17.9  ICD-9-CM: 584.9  07/22/2018 Unknown              Discharge Diagnosis:   1. Enterocolitis - stool culture positive for giardia, cryptosporidium  2. 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 New diagnosis of HIV  5. Dehydration - resolved  6. AKI present on admission - resolved  7. Metabolic acidosis  8. Asthma  9. History of Bell's palsy  10. Elevated LFTs - resolved  11. Moderate protein calorie malnutrition  12. Unintentional weight loss ~ 20 lbs in couple of months    Hospital course  Mr. Tener was admitted to medical floor  GI was consulted. CT scan with numerous fluid filled loops of small and lg bowel w/ borderline diffuse small bowel wall thickening-concerning for infectious or inflammatory enterocolitis-progressed from prior CT   Initial thought process was to check for IBD. Steroid was added.  Stool culture also came back positive for giardia and cryptosporidium Ag.   Patient was started on nitazoxanide. ID was consulted.  HIV screening test and HIV confirmatory test both were positive  ID recommends following up with [facility]  HIV Quantitative PCR is 1.15 million copies/ml  Absolute CD4 count is 17  Patient is recovering well. His diarrhea has much improved. He is  tolerating oral intake well. AKI resolved. He is medically stable for discharge on 08/12/2018    He will be started on PCP and MAC prophylaxis    He will continue nitazoxanide 500 mg BID for 14 days  - script was written by Dr. Glendora Score and pharmacy was able to arrange his medication at the time of discharge    Plan of care discussed at length with his mom        Discharge instructions:    Check CBC BMP in one week - script provided to patient. He will follow up with Dr.[name]    Follow-up:  PCP Dr. Jake Michaelis - one week  [facility] - 08/14/2018 at 11 AM       HPI on Admission (per admitting physician Dr. Tonye Royalty):  Travis [name] is a 28 y.o. year old male hospitalization from May 26 through June 5 for 10 days regarding intractable nausea and vomiting abdominal pain and LFTs.  Patient was diagnosed with enteritis, stool studies and hepatitis panel during that admission were negative, there is a suspicion for possible inflammatory bowel disease and at that time outpatient follow-up with GI was planned.  Patient was treated with fluids, dietary changes.  ??  Patient  was home for approximately 2 days and states that he just never "got better ".  Reports initially stuck to clear liquid diets however mom states she tried to feed him a hot dog 2 days ago and he has had ongoing and worsening nausea vomiting abdominal pain since then.  He states he feels very weak and tired, feels like his mouth is always dry.  Has a steady state of nausea.  Took Bentyl at home without any relief.  Denies any blood in the vomit or in his stool.  Denies family history of inflammatory bowel disease.  ??  ER course: CBC shows hemoglobin of 18 likely secondary to dehydration.  Metabolic work-up shows sodium 132 potassium 3.1 BUN of 75 and creatinine of 2.1 also consistent with prerenal dehydration.  Lactic acid normal.  CT of the abdomen and pelvis reveals numerous fluid-filled loops of small and  large bowel with borderline diffuse small bowel wall thickening concerning for infectious or inflammatory enterocolitis.  Findings somewhat progressed from paired study May 27.  Patient was given 2 L normal saline, 4 mg Zofran.  GI consulted.  ??      Condition at discharge:  stable    Disposition: home    Code status : full code        Consultants:    1. Gastroenterology Dr. Walden Field  2. ID Dr. Glendora Score    Physical Exam on Discharge:  Visit Vitals  BP 139/90 (BP Patient Position: Sitting)   Pulse 98   Temp 97.9 ??F (36.6 ??C)   Resp 18   Ht _0  (1.702 m)   Wt 72.2 kg (159 lb 2.8 oz)   SpO2 100%   BMI 24.93 kg/m??     GEN - AAOx3,??looks tired, but not in distress  HEENT - , mucous membranes moist  Neck - supple, no JVD  Cardiac - RRR, S1, S2, no murmurs  Chest/Lungs - clear to auscultation without wheezes or rhonchi  Abdomen - soft,??non tender  Extremities - no clubbing/ cyanosis/ edema  Neuro - CN 2-12 intact. No focal deficits. No motor or sensory deficit appreciated.   Skin - no rashes or lesions    Discharge medications :  Current Discharge Medication List      START taking these medications    Details   potassium chloride (K-DUR, KLOR-CON) 20 mEq tablet Take 1 Tab by mouth two (2) times a day.  Qty: 6 Tab, Refills: 0      ondansetron (ZOFRAN ODT) 4 mg disintegrating tablet Take 1 Tab by mouth every eight (8) hours as needed for Nausea or Vomiting.  Qty: 20 Tab, Refills: 0      trimethoprim-sulfamethoxazole (Bactrim DS) 160-800 mg per tablet Take 1 Tab by mouth daily. Indications: Pneumocystis jiroveci pneumonia prevention  Qty: 30 Tab, Refills: 1      azithromycin (ZITHROMAX) 600 mg tablet Take 2 Tabs by mouth every seven (7) days. Indications: prevention of Mycobacterium avium complex disease  Qty: 14 Tab, Refills: 1    Associated Diagnoses: Asymptomatic HIV infection ([place])         CONTINUE these medications which have CHANGED    Details   dicyclomine (BENTYL) 10 mg capsule Take 1 Cap by mouth every six (6) hours  as needed for Abdominal Cramps.  Qty: 12 Cap, Refills: 0         CONTINUE these medications which have NOT CHANGED    Details   omeprazole (PRILOSEC) 40 mg capsule Take 1 Cap by mouth  Daily (before breakfast).  Qty: 30 Cap, Refills: 0      albuterol (PROAIR HFA) 90 mcg/actuation inhaler Take 1 Puff by inhalation every four (4) hours as needed for Wheezing.  Qty: 1 Inhaler, Refills: 0                 Most Recent Labs:  Recent Results (from the past 24 hour(s))   CBC WITH AUTOMATED DIFF    Collection Time: 08/12/18  2:41 AM   Result Value Ref Range    WBC 5.8 4.0 - 11.0 1000/mm3    RBC 4.66 3.80 - 5.70 M/uL    HGB 13.1 12.4 - 17.2 gm/dl    HCT 38.1 37.0 - 50.0 %    MCV 81.8 80.0 - 98.0 fL    MCH 28.1 23.0 - 34.6 pg    MCHC 34.4 30.0 - 36.0 gm/dl    PLATELET 165 140 - 450 1000/mm3    MPV 12.7 (H) 6.0 - 10.0 fL    RDW-SD 40.9 35.1 - 43.9      NRBC 0 0 - 0      IMMATURE GRANULOCYTES 0.5 0.0 - 3.0 %    NEUTROPHILS 65.3 (H) 34 - 64 %    LYMPHOCYTES 14.6 (L) 28 - 48 %    MONOCYTES 16.7 (H) 1 - 13 %    EOSINOPHILS 2.6 0 - 5 %    BASOPHILS 0.3 0 - 3 %   METABOLIC PANEL, BASIC    Collection Time: 08/12/18  2:41 AM   Result Value Ref Range    Sodium 143 136 - 145 mEq/L    Potassium 3.3 (L) 3.5 - 5.1 mEq/L    Chloride 118 (H) 98 - 107 mEq/L    CO2 17 (L) 21 - 32 mEq/L    Glucose 83 74 - 106 mg/dl    BUN 13 7 - 25 mg/dl    Creatinine 1.0 0.6 - 1.3 mg/dl    GFR est AA >60      GFR est non-AA >60      Calcium 7.9 (L) 8.5 - 10.1 mg/dl    Anion gap 7 5 - 15 mmol/L         XR Results:  Results from Hospital Encounter encounter on 07/21/18   XR ABD (AP AND ERECT OR DECUB)    Narrative Clinical history: Abdominal pain, ileus    EXAMINATION:  Supine and upright projections of the abdomen 07/30/2018    FINDINGS:  Air is noted in the ascending, transverse and portions of the descending colon  and rectosigmoid. No dilated loops of small or bowel. Multiple air-fluid levels  on the upright projection.      Impression IMPRESSION:  Ileus.          CT Results:  Results from Hospital Encounter encounter on 08/06/18   CT ABD PELV WO CONT    Narrative CT Abdomen and Pelvis without    Indication: Abdominal pain. Nausea, vomiting, diarrhea.    Comparison: 07/22/2018.    TECHNIQUE:   CT of the abdomen and pelvis WITHOUT intravenous contrast. Coronal and sagittal  reformations were obtained. Oral contrast was given.    All CT exams at this facility use one or more dose reduction techniques  including automatic exposure control, mA/kV adjustment per patient's size, or  iterative reconstruction technique. DICOM format imaged data is available to  non-affiliated external healthcare facilities or entities on a secure, media  free, reciprocal searchable basis with patient authorization for 12 months  following the date of the study.    DISCUSSION:    ABSENCE OF INTRAVENOUS CONTRAST DECREASES SENSITIVITY FOR DETECTION OF FOCAL  LESIONS AND VASCULAR PATHOLOGY.    LOWER THORAX: Normal.    HEPATOBILIARY: No focal hepatic lesions.  No biliary ductal dilatation.  SPLEEN: No splenomegaly.  PANCREAS: No focal masses or ductal dilatation.    ADRENALS: No adrenal nodules.  KIDNEYS/URETERS: No hydronephrosis, stones, or solid mass lesions.  PELVIC ORGANS/BLADDER: Unremarkable.    PERITONEUM / RETROPERITONEUM: No free air or fluid.  LYMPH NODES: No lymphadenopathy.  VESSELS: Unremarkable.    GI TRACT: Multiple nondistended fluid-filled loops of small bowel with  questionable mild wall thickening. Fluid-filled colon with no significant wall  thickening. No obstruction. Normal appendix.    BONES AND SOFT TISSUES: No acute abnormality.      Impression IMPRESSION:    Numerous fluid-filled loops of small and large bowel with borderline diffuse  small bowel wall thickening, concerning for infectious or inflammatory  enterocolitis. Findings have slightly progressed since 07/22/2018.         MRI Results:  No results found for this or any previous visit.    Nuclear Medicine Results:   No results found for this or any previous visit.    [place] Results:  Results from [place] encounter on 07/21/18   [place] RUQ    Narrative Clinical history: Elevated liver function studies    EXAMINATION:  Upper quadrant ultrasound 07/28/2018    FINDINGS:  Liver demonstrates homogeneous echotexture. No intrahepatic bile duct  dilatation. Bile duct measures 4 mm. No stones are seen in the gallbladder.  Gallbladder wall measures 2 mm. Sonographic Percell Miller sign is negative.    Pancreas, proximal aorta and IVC are within normal limits. Right kidney measures  10.4 x 5.6 x 3.7 cm. Normal corticomedullary differentiation. No hydronephrosis.  Small amounts of perihepatic ascites.      Impression IMPRESSION:  Small amounts of perihepatic ascites.         IR Results:  No results found for this or any previous visit.    VAS/US Results:  No results found for this or any previous visit.      Total discharge time 45 minutes.      Perfecto Kingdom, MD  [facility]  August 12, 2018  11:27 AM

## 2018-08-12 NOTE — Progress Notes (Signed)
Chaplain visit and prayer.

## 2018-08-12 NOTE — Other (Signed)
Page Sent      PAGER ID: [phone]   MESSAGE: Re Pt in [phone]): His mom Mina Marble) requested you please call her at [phone]. Thank you, Crystal [phone]

## 2018-08-12 NOTE — Discharge Summary (Signed)
Discharge Summary by Everlene Farrier, MD at 08/12/18 1127                Author: Everlene Farrier, MD  Service: Hospitalist  Author Type: Physician       Filed: 08/21/18 1542  Date of Service: 08/12/18 1127  Status: Addendum          Editor: Everlene Farrier, MD (Physician)          Related Notes: Original Note by Everlene Farrier, MD (Physician) filed at 08/12/18 1439                  Discharge Summary                   Patient ID:   Travis [name], 28 y.o.,  male   DOB: Nov 22, 1990      Admit Date: 08/06/2018   Discharge Date:  08/12/2018   Length of stay: 6 day(s)      PCP:  Janell Quiet, MD        Chief Complaint       Patient presents with        ?  Abdominal Pain     ?  Vomiting        ?  Diarrhea              Hospital Problems   Date Reviewed:  05/03/2014                         Codes  Class  Noted  POA              Dehydration  ICD-10-CM: E86.0   ICD-9-CM: 276.51    08/06/2018  Unknown                        Enterocolitis  ICD-10-CM: K52.9   ICD-9-CM: 558.9    08/06/2018  Unknown                        Intractable nausea and vomiting  ICD-10-CM: R11.2   ICD-9-CM: 536.2    08/06/2018  Unknown                        AKI (acute kidney injury) ([place])  ICD-10-CM: N17.9   ICD-9-CM: 584.9    07/22/2018  Unknown                          Discharge Diagnosis:    1.  Enterocolitis - stool culture positive for giardia, cryptosporidium   2.  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  New diagnosis of HIV   5.  Dehydration - resolved   6.  AKI present on admission - resolved   7.  Metabolic acidosis   8.  Asthma   9.  History of Bell's palsy   10.  Elevated LFTs - resolved   11.  Moderate protein calorie malnutrition   12.  Unintentional weight loss ~ 20 lbs in couple of months      Hospital course   Mr. Minahan was admitted to medical floor   GI was consulted. CT scan with numerous fluid filled loops of small and lg bowel w/ borderline diffuse small bowel wall  thickening-concerning for infectious or inflammatory enterocolitis-progressed from  prior CT    Initial thought process was to check for IBD. Steroid was added.   Stool culture also came back positive for giardia and cryptosporidium Ag.    Patient was started on nitazoxanide. ID was consulted.   HIV screening test and HIV confirmatory test both were positive   ID recommends following up with [facility]   HIV Quantitative PCR is 1.15 million copies/ml   Absolute CD4 count is 17   Patient is recovering well. His diarrhea has much improved. He is tolerating oral intake well. AKI resolved. He is medically stable for discharge on 08/12/2018      He will be started on PCP and MAC prophylaxis      He will continue nitazoxanide 500 mg BID for 14 days  - script was written by Dr. Glendora Score and pharmacy was able to arrange his medication at  the time of discharge      Plan of care discussed at length with his mom            Discharge instructions:     Check CBC BMP in one week - script provided to patient. He will follow up with Dr.[name]      Follow-up:   PCP Dr. Jake Michaelis - one week   [facility] - 08/14/2018 at 11 AM          HPI on Admission (per admitting physician Dr. Tonye Royalty):   Candice Lunney [name] is a 29 y.o. year old male hospitalization from May 26 through June 5 for 10 days regarding intractable nausea and vomiting abdominal pain and LFTs.  Patient  was diagnosed with enteritis, stool studies and hepatitis panel during that admission were negative, there is a suspicion for possible inflammatory bowel disease and at that time outpatient follow-up with GI was planned.  Patient was treated with fluids,  dietary changes.   ??   Patient was home for approximately 2 days and states that he just never "got better ".  Reports initially stuck to clear liquid diets however mom states she tried to feed him a hot dog 2 days ago and he has had ongoing and worsening nausea vomiting abdominal  pain since then.  He states he feels  very weak and tired, feels like his mouth is always dry.  Has a steady state of nausea.  Took Bentyl at home without any relief.  Denies any blood in the vomit or in his stool.  Denies family history of inflammatory  bowel disease.   ??   ER course: CBC shows hemoglobin of 18 likely secondary to dehydration.  Metabolic work-up shows sodium 132 potassium 3.1 BUN of 75 and creatinine  of 2.1 also consistent with prerenal dehydration.  Lactic acid normal.  CT of the abdomen and pelvis reveals numerous fluid-filled loops of small and large bowel with borderline diffuse small bowel wall thickening concerning for infectious or inflammatory  enterocolitis.  Findings somewhat progressed from paired study May 27.  Patient was given 2 L normal saline, 4 mg Zofran.  GI consulted.   ??         Condition at discharge:   stable      Disposition: home      Code status : full code            Consultants:     1.  Gastroenterology Dr. Walden Field   2.  ID Dr. Glendora Score      Physical Exam on Discharge:   Visit Vitals  BP  139/90 (BP Patient Position: Sitting)     Pulse  98     Temp  97.9 ??F (36.6 ??C)     Resp  18     Ht  _0  (1.702 m)     Wt  72.2 kg (159 lb 2.8 oz)     SpO2  100%        BMI  24.93 kg/m??        GEN - AAOx3,??looks tired, but not in distress   HEENT - , mucous membranes moist   Neck - supple, no JVD   Cardiac - RRR, S1, S2, no murmurs   Chest/Lungs - clear to auscultation without wheezes or rhonchi   Abdomen - soft,??non tender   Extremities - no clubbing/ cyanosis/ edema   Neuro - CN 2-12 intact. No focal deficits. No motor or sensory deficit appreciated.    Skin - no rashes or lesions      Discharge medications :     Current Discharge Medication List              START taking these medications          Details        potassium chloride (K-DUR, KLOR-CON) 20 mEq tablet  Take 1 Tab by mouth two (2) times a day.   Qty: 6 Tab, Refills:  0               ondansetron (ZOFRAN ODT) 4 mg disintegrating tablet  Take 1 Tab by mouth  every eight (8) hours as needed for Nausea or Vomiting.   Qty: 20 Tab, Refills:  0               trimethoprim-sulfamethoxazole (Bactrim DS) 160-800 mg per tablet  Take 1 Tab by mouth daily. Indications: Pneumocystis jiroveci pneumonia prevention   Qty: 30 Tab, Refills:  1               azithromycin (ZITHROMAX) 600 mg tablet  Take 2 Tabs by mouth every seven (7) days. Indications: prevention of Mycobacterium avium complex disease   Qty: 14 Tab, Refills:  1          Associated Diagnoses: Asymptomatic HIV infection ([place])                     CONTINUE these medications which have CHANGED          Details        dicyclomine (BENTYL) 10 mg capsule  Take 1 Cap by mouth every six (6) hours as needed for Abdominal Cramps.   Qty: 12 Cap, Refills:  0                     CONTINUE these medications which have NOT CHANGED          Details        omeprazole (PRILOSEC) 40 mg capsule  Take 1 Cap by mouth Daily (before breakfast).   Qty: 30 Cap, Refills:  0               albuterol (PROAIR HFA) 90 mcg/actuation inhaler  Take 1 Puff by inhalation every four (4) hours as needed for Wheezing.   Qty: 1 Inhaler, Refills:  0                               Most Recent Labs:     Recent Results (  from the past 24 hour(s))     CBC WITH AUTOMATED DIFF          Collection Time: 08/12/18  2:41 AM         Result  Value  Ref Range            WBC  5.8  4.0 - 11.0 1000/mm3       RBC  4.66  3.80 - 5.70 M/uL       HGB  13.1  12.4 - 17.2 gm/dl       HCT  38.1  37.0 - 50.0 %       MCV  81.8  80.0 - 98.0 fL       MCH  28.1  23.0 - 34.6 pg       MCHC  34.4  30.0 - 36.0 gm/dl       PLATELET  165  140 - 450 1000/mm3       MPV  12.7 (H)  6.0 - 10.0 fL       RDW-SD  40.9  35.1 - 43.9         NRBC  0  0 - 0         IMMATURE GRANULOCYTES  0.5  0.0 - 3.0 %       NEUTROPHILS  65.3 (H)  34 - 64 %       LYMPHOCYTES  14.6 (L)  28 - 48 %       MONOCYTES  16.7 (H)  1 - 13 %       EOSINOPHILS  2.6  0 - 5 %       BASOPHILS  0.3  0 - 3 %       METABOLIC PANEL, BASIC           Collection Time: 08/12/18  2:41 AM         Result  Value  Ref Range            Sodium  143  136 - 145 mEq/L            Potassium  3.3 (L)  3.5 - 5.1 mEq/L            Chloride  118 (H)  98 - 107 mEq/L       CO2  17 (L)  21 - 32 mEq/L       Glucose  83  74 - 106 mg/dl       BUN  13  7 - 25 mg/dl       Creatinine  1.0  0.6 - 1.3 mg/dl       GFR est AA  >60          GFR est non-AA  >60          Calcium  7.9 (L)  8.5 - 10.1 mg/dl            Anion gap  7  5 - 15 mmol/L              XR Results:     Results from Hospital Encounter encounter on 07/21/18     XR ABD (AP AND ERECT OR DECUB)           Narrative  Clinical history: Abdominal pain, ileus      EXAMINATION:   Supine and upright projections of the abdomen 07/30/2018      FINDINGS:   Air is noted in the ascending, transverse and portions of the descending  colon   and rectosigmoid. No dilated loops of small or bowel. Multiple air-fluid levels   on the upright projection.              Impression  IMPRESSION:   Ileus.              CT Results:     Results from Hospital Encounter encounter on 08/06/18     CT ABD PELV WO CONT           Narrative  CT Abdomen and Pelvis without      Indication: Abdominal pain. Nausea, vomiting, diarrhea.      Comparison: 07/22/2018.      TECHNIQUE:    CT of the abdomen and pelvis WITHOUT intravenous contrast. Coronal and sagittal   reformations were obtained. Oral contrast was given.      All CT exams at this facility use one or more dose reduction techniques   including automatic exposure control, mA/kV adjustment per patient's size, or   iterative reconstruction technique. DICOM format imaged data is available to   non-affiliated external healthcare facilities or entities on a secure, media   free, reciprocal searchable basis with patient authorization for 12 months   following the date of the study.      DISCUSSION:      ABSENCE OF INTRAVENOUS CONTRAST DECREASES SENSITIVITY FOR DETECTION OF FOCAL   LESIONS AND VASCULAR PATHOLOGY.      LOWER  THORAX: Normal.      HEPATOBILIARY: No focal hepatic lesions.  No biliary ductal dilatation.   SPLEEN: No splenomegaly.   PANCREAS: No focal masses or ductal dilatation.      ADRENALS: No adrenal nodules.   KIDNEYS/URETERS: No hydronephrosis, stones, or solid mass lesions.   PELVIC ORGANS/BLADDER: Unremarkable.      PERITONEUM / RETROPERITONEUM: No free air or fluid.   LYMPH NODES: No lymphadenopathy.   VESSELS: Unremarkable.      GI TRACT: Multiple nondistended fluid-filled loops of small bowel with   questionable mild wall thickening. Fluid-filled colon with no significant wall   thickening. No obstruction. Normal appendix.      BONES AND SOFT TISSUES: No acute abnormality.              Impression  IMPRESSION:      Numerous fluid-filled loops of small and large bowel with borderline diffuse   small bowel wall thickening, concerning for infectious or inflammatory   enterocolitis. Findings have slightly progressed since 07/22/2018.              MRI Results:   No results found for this or any previous visit.      Nuclear Medicine Results:   No results found for this or any previous visit.      [place] Results:     Results from [place] encounter on 07/21/18     [place] RUQ           Narrative  Clinical history: Elevated liver function studies      EXAMINATION:   Upper quadrant ultrasound 07/28/2018      FINDINGS:   Liver demonstrates homogeneous echotexture. No intrahepatic bile duct   dilatation. Bile duct measures 4 mm. No stones are seen in the gallbladder.   Gallbladder wall measures 2 mm. Sonographic Percell Miller sign is negative.      Pancreas, proximal aorta and IVC are within normal limits. Right kidney measures   10.4 x 5.6 x 3.7 cm. Normal corticomedullary differentiation. No hydronephrosis.   Small amounts  of perihepatic ascites.              Impression  IMPRESSION:   Small amounts of perihepatic ascites.              IR Results:   No results found for this or any previous visit.      VAS/US Results:   No results  found for this or any previous visit.         Total discharge time 45 minutes.         Perfecto Kingdom, MD   [facility]   August 12, 2018   11:27 AM

## 2018-08-12 NOTE — Progress Notes (Signed)
ADULT MALNUTRITION GUIDELINES    Patient is a 28 y.o. male, admitted on 08/06/2018 with a diagnosis of Enterocolitis [K52.9]  Dehydration [E86.0]  Enterocolitis [K52.9]  AKI (acute kidney injury) (HCC) [N17.9]  Intractable nausea and vomiting [R11.2].  Nutrition assessment was completed by RD and the patient was found to meet the following malnutrition criteria established by ASPEN/AND:    Adult Malnutrition Guidelines:  SEVERE PROTEIN CALORIE MALNUTRITION IN THE CONTEXT OF ACUTE INJURY/ILLNESS  Weight loss: 5.4% x 3 weeks  Energy intake: <75% energy intake compared to estimated energy needs >7 days  Body fat: mild depletion  Muscle mass: mild depletion     Please document Severe Protein Calorie Malnutrition in Problem List if in agreement    NUTRITION RECOMMENDATIONS:   Regular diet, discontinued Ensure Clear per request, added Ensure Pudding once daily (170 kcals, 4 gm protein)   Dislikes Ensure shakes    NUTRITION INITIAL EVALUATION    NUTRITION ASSESSMENT:       Reason for assessment: LOS     Admitting diagnosis: Enterocolitis [K52.9]  Dehydration [E86.0]  Enterocolitis [K52.9]  AKI (acute kidney injury) (HCC) [N17.9]  Intractable nausea and vomiting [R11.2]     PMH:   Past Medical History:   Diagnosis Date   . Asthma    . Hypertension    . Neurological disorder     Bell's palsy       Current pertinent medications: KCl, alinea, bentyl     Pertinent labs: 6/17 - K+ 3.3 (on KCl), pert labs reviewed        Anthropometrics & Physical Assessment:   Height:   Ht Readings from Last 3 Encounters:   08/06/18 5\' 7"  (1.702 m)   07/21/18 5\' 9"  (1.753 m)   04/10/18 5\' 8"  (1.727 m)      Weight:   Wt Readings from Last 3 Encounters:   08/12/18 72.2 kg (159 lb 2.8 oz)   07/29/18 71 kg (156 lb 8.4 oz)   04/10/18 85.7 kg (189 lb)         BMI: Body mass index is 24.93 kg/m.    IBW: Ideal body weight: 66.1 kg (145 lb 11.6 oz)           UBW: 168# per patient     Wt change: ~5.4% wt loss x 3 weeks due to acute illness. Pt also  reports losing weight over past 2-3 years (went from 270# down to 168#); however, this was intentional ( ~38% wt loss x 2-3 years)          [x]  significant       []  not significant         []  intended         [x]  not intended    GI symptoms/issues:       Adm with enterocolitis, denies nausea/vomiting, denies abd pain at this time   Last Bowel Movement Date: 08/11/18  Stool Appearance: Loose  Abdominal Assessment: Soft  Bowel Sounds: Active     Chewing/swallowing issues:   None     Skin integrity:   Intact         Muscle wasting:           Temporal: mild   Clavicle: mild   Other: n/a     Fat wasting:   Suborbital: mild   Other: n/a                 Fluid accumulation:    None  Diet and intake history:    Current diet order: DIET REGULAR   DIET NUTRITIONAL SUPPLEMENTS Dinner; Ensure Pudding     Food allergies: NKFA     Diet/intake history: eats 2 larger meals per day plus 3 smaller meals per day. PO intake decreased x 3 weeks due to acute enterocolitis. GI sxs now resolved per patient. Is a "slow eater," may eat part of meal, then go back to eating after quite some time later. Pt dislikes taste of Ensure Clear and Ensure Enlive shakes; however, willing to try pudding. Also trying to avoid dairy, so refusing Magic Cup option.            []  <50% intake x >5 days         []  <50% intake x >1 month         [x]  <75% intake x >7 days         []  <75% intake x 1 month         []  <75% intake x 3 months     Current appetite/PO intake:    Fair po intake per pt report     Assessment of Current MNT:   Diet appropriate, adequate to meet nutrition needs if po >75% with meals. Discontinued Ensure Clear per request, willing to try Ensure Pudding to increase calorie/pro intake opportunity.       Estimated daily nutrition intake needs:   1800 - 2200 kcals (25-30 kcals/kg)     85 - 100 g protein (1.2-1.4 gm/kg)     2200 mL fluid (30 ml/kg)    NUTRITION DIAGNOSIS:     1. Severe acute protein calorie malnutrition  related to enterocolitis/acute illness as evidenced by po intake <75% x > 7 days, wt loss of 5.4% x 3 weeks, mild physical wasting.        NUTRITION INTERVENTION / RECOMMENDATIONS:     Regular diet, discontinued Ensure Clear per request, added Ensure Pudding once daily (170 kcals, 4 gm protein)   Dislikes Ensure shakes    NUTRITION MONITORING AND EVALUATION:     Nutrition level of care:  []  low       []  moderate      [x]  high    Nutrition monitoring: PO intake, diet tolerance and compliance, wt, BS, CMP, hydration, medical changes    Nutrition goals:  PO intake >75%, nutrition related labs WNL, dry wt stable during LOS, maintain skin integrity     Kaylyn Layer, RD  08/12/18

## 2018-08-12 NOTE — Progress Notes (Signed)
Progress  Notes by Alfonso EllisMooney, Ceylon Arenson, MD at 08/12/18 1018                Author: Alfonso EllisMooney, Aleathia Purdy, MD  Service: Infectious Disease  Author Type: Physician       Filed: 08/12/18 1120  Date of Service: 08/12/18 1018  Status: Signed          Editor: Alfonso EllisMooney, Orhan Mayorga, MD (Physician)                                                                                       INFECTIOUS DISEASE PROGRESS    NOTE          Requested by: Dr  [name]      Reason for consult: chronic diarrhea      Date of admission: 08/06/2018              ABX:           Current abx  Prior abx         nitazoxinide 6/13  4            ASSESSMENT:            cryptosporidia/ Giardia concomitant infection    source unknown        HIV- new diagnosis     VL > 1 mil     cd4 IP        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        neutropenia with adequate anc                         nkda                RECOMMENDATIONS:        1- micro  repeated the testing on the stool that resulted in BOTH + for giardia AND crypto- possible- with exposure to contaminated source water,  Again +   HIV testing + HIV An, pcr, cd4 IP, genomic testing for resistance ordered      Rx  With nitazoxanide 1000 mg po bid till dc , then 500 mg bid to complete    x 14 - 28 d   Monitor stool count   HIV AN +   CD4, VL IP   Will obtain genomic testing - for appropriate HAART  determination - sent out 6/15-IP   HIV baseline labs- after review of [facility] w/u- to avoid duplication    update vaccines when CD4 adequate- when out pt with other ID provider   Pt will need to be set up with long term provider for management as outpt for this- [facility] pt in detail- [facility] is best choice      He is to see [facility] as new pt for his HIV management and [facility] can folow him on the nitazoxinide- he will need 2-4 weeks      Would like to have the presscrition filled for 500 mg bid to complete 14 d prior to leaving [facility]- as outside pharmacies may not carry. [facility]- appreciate the assist   Spoke this am- med  is here   Ok to dc from ID standpint      [facility] Dr Ninetta Lights                           MICROBIOLOGY:           5/27  Neg c diff   6/2    Stool culture neg   6/12  + for BOTH giardia and cryptosporidia-- lab repeated test with same results     LINES AND CATHETERS:     PIV        CC      better today..         Past medical history:          Past Medical History:        Diagnosis  Date         ?  Asthma       ?  Hypertension       ?  Neurological disorder            Bell's palsy           History reviewed. No pertinent surgical history.                  Current Medications:          Current Facility-Administered Medications          Medication  Dose  Route  Frequency           ?  potassium chloride (K-DUR, KLOR-CON) SR tablet 40 mEq   40 mEq  Oral  BID     ?  nitazoxanide (ALINIA) tablet 1,000 mg   1,000 mg  Oral  BID     ?  albuterol (PROVENTIL VENTOLIN) nebulizer solution 2.5 mg   2.5 mg  Nebulization  Q4H PRN     ?  dicyclomine (BENTYL) capsule 10 mg   10 mg  Oral  QID     ?  acetaminophen (TYLENOL) tablet 650 mg   650 mg  Oral  Q4H PRN          Or           ?  acetaminophen (TYLENOL) solution 650 mg   650 mg  Oral  Q4H PRN          Or           ?  acetaminophen (TYLENOL) suppository 650 mg   650 mg  Rectal  Q4H PRN     ?  naloxone (NARCAN) injection 0.1 mg   0.1 mg  IntraVENous  PRN     ?  morphine injection 2 mg   2 mg  IntraVENous  Q4H PRN           ?  ondansetron (ZOFRAN) injection 4 mg   4 mg  IntraVENous  Q4H PRN              Physical Exam:   Vitals   Temp (24hrs), Avg:98.7 ??F (37.1 ??C), Min:97.8 ??F (36.6 ??C), Max:99.4 ??F (37.4 ??C)      Visit Vitals      BP  120/66 (BP Patient Position: Supine)     Pulse  (!) 101     Temp  99.3 ??F (37.4 ??C)     Resp  18     Ht  5\' 7"  (1.702 m)     Wt  72.2 kg (159 lb 2.8 oz)     SpO2  100%        BMI  24.93 kg/m??                       Labs:  Results:        Chemistry  Recent Labs         08/12/18   0241  08/11/18   1238  08/11/18   1029  08/11/18   0259      GLU  83   --    --   91       NA  143   --    --   139      K  3.3*  3.4*  9.1*  2.9*      CL  118*   --    --   113*      CO2  17*   --    --   17*      BUN  13   --    --   16      CREA  1.0   --    --   0.8      CA  7.9*   --    --   8.0*      AGAP  7   --    --   9      AP   --    --    --   90      TP   --    --    --   7.5      ALB   --    --    --   3.0*                 CBC w/Diff  Recent Labs         08/12/18   0241  08/11/18   0259  08/10/18   0312      WBC  5.8  6.2  4.7      RBC  4.66  4.66  4.56      HGB  13.1  13.1  12.9      HCT  38.1  37.5  36.4*      PLT  165  137*  122*      GRANS  65.3*  68.3*  68.2*      LYMPH  14.6*  15.2*  11.9*      EOS  2.6  2.4  2.5                 Microbiology  No results for input(s): CULT in the last 72 hours.           Imaging-        Results from Hospital Encounter encounter on 07/21/18     XR ABD (AP AND ERECT OR DECUB)           Narrative  Clinical history: Abdominal pain, ileus      EXAMINATION:   Supine and upright projections of the abdomen 07/30/2018      FINDINGS:   Air is noted in the ascending, transverse and portions of the descending colon   and rectosigmoid. No dilated loops of small or bowel. Multiple air-fluid levels   on the upright projection.              Impression  IMPRESSION:   Ileus.  Results from Hospital Encounter encounter on 08/06/18     CT ABD PELV WO CONT           Narrative  CT Abdomen and Pelvis without      Indication: Abdominal pain. Nausea, vomiting, diarrhea.      Comparison: 07/22/2018.      TECHNIQUE:    CT of the abdomen and pelvis WITHOUT intravenous contrast. Coronal and sagittal   reformations were obtained. Oral contrast was given.      All CT exams at this facility use one or more dose reduction techniques   including automatic exposure control, mA/kV adjustment per patient's size, or   iterative reconstruction technique. DICOM format imaged data is available to   non-affiliated external healthcare facilities or entities on a secure, media    free, reciprocal searchable basis with patient authorization for 12 months   following the date of the study.      DISCUSSION:      ABSENCE OF INTRAVENOUS CONTRAST DECREASES SENSITIVITY FOR DETECTION OF FOCAL   LESIONS AND VASCULAR PATHOLOGY.      LOWER THORAX: Normal.      HEPATOBILIARY: No focal hepatic lesions.  No biliary ductal dilatation.   SPLEEN: No splenomegaly.   PANCREAS: No focal masses or ductal dilatation.      ADRENALS: No adrenal nodules.   KIDNEYS/URETERS: No hydronephrosis, stones, or solid mass lesions.   PELVIC ORGANS/BLADDER: Unremarkable.      PERITONEUM / RETROPERITONEUM: No free air or fluid.   LYMPH NODES: No lymphadenopathy.   VESSELS: Unremarkable.      GI TRACT: Multiple nondistended fluid-filled loops of small bowel with   questionable mild wall thickening. Fluid-filled colon with no significant wall   thickening. No obstruction. Normal appendix.      BONES AND SOFT TISSUES: No acute abnormality.              Impression  IMPRESSION:      Numerous fluid-filled loops of small and large bowel with borderline diffuse   small bowel wall thickening, concerning for infectious or inflammatory   enterocolitis. Findings have slightly progressed since 07/22/2018.              ---------------------------------------------------------------------------------------------------------------   I have independently examined the patient and reviewed all lab studies and imgaing as well as review of nursing notes and physican notes          Alfonso EllisMartha Makilah Dowda, MD   08/12/2018      [facility]   [phone]

## 2018-08-12 NOTE — Progress Notes (Signed)
Waiting for client to stablize anticapated d/c soon. Request for records have been sent to [facility]. I have not seen the records in clients chart at this time. At d/c client will need a specific med that dr Glynda Jaeger has been working on with pharmacy. Clients follow up for new hiv and cont medication support will be thru e.v.m.[facility].Marland Kitchen

## 2018-08-15 LAB — CALPROTECTIN, FECAL
CALPROTECTIN,FECAL, CALPF: <5 mcg/g
Calprotectin, stool: <5 mcg/g

## 2018-08-18 ENCOUNTER — Emergency Department: Admit: 2018-08-19 | Payer: MEDICAID | Primary: Family Medicine

## 2018-08-18 DIAGNOSIS — B20 Human immunodeficiency virus [HIV] disease: Secondary | ICD-10-CM

## 2018-08-18 NOTE — ED Notes (Signed)
Pt is actively vomiting.    Pt just started complaining of chest discomfort when he went from wheelchair and pivoted to the bed.    Primary RN notified of pt's new complaint.    Family at bedside.

## 2018-08-18 NOTE — ED Notes (Signed)
Patient complaining of nausea, vomiting, and diarrhea since 07/16/2018.

## 2018-08-18 NOTE — ED Triage Notes (Signed)
C/o abd pain, NVD since May 24th.    Was admitted for NVD here and discharged last Wed.

## 2018-08-18 NOTE — ED Provider Notes (Signed)
[facility]  Emergency Department Treatment Report    Patient: Travis [name] Age: 28 y.o. Sex: male    Date of Birth: Jun 14, 1990 Admit Date: 08/18/2018 PCP: Janell Quiet, MD   MRN: [id number]  CSN: [id number]     Room: 2108/2108 Time Dictated: 10:11 PM      Dragon medical dictation software was used for portions of this report.  Unintended transcription errors may occur.    Chief Complaint   Nausea and vomiting  History of Present Illness   27 y.o. 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  States that he has been incessantly vomiting really since the end of May, did not really feel that much better after he left the hospital has been vomiting since roughly 2-3 times per day.  Noticed that his emesis turned red last night, he is unsure if this is blood or not, and then again turned red at this evening and then opted to check and to the hospital as the vomiting is profuse, he feels overall weak, dehydrated, and was so weak that he fell to the ground earlier today.  He has diffuse abdominal pain, but has had no fever.  He is noted no bloody stools.  Shortly after I walk into the room he realized that last night before he vomited he had a drink of red Gatorade, and then tonight also drank red Gatorade.  States that he has persistent profuse diarrhea, to many times to count during the day.    Review of Systems   Review of Systems   Constitutional: Positive for malaise/fatigue. Negative for chills, diaphoresis, fever and weight loss.   HENT: Negative for congestion and sore throat.    Respiratory: Negative for cough, sputum production, shortness of breath and wheezing.    Cardiovascular: Negative for chest pain, palpitations, orthopnea and leg swelling.   Gastrointestinal: Positive for abdominal pain, diarrhea, nausea and  vomiting.   Genitourinary: Negative for dysuria, frequency and urgency.   Musculoskeletal: Negative for back pain and falls.   Skin: Negative for rash.   Neurological: Negative for dizziness and headaches.   Endo/Heme/Allergies: Does not bruise/bleed easily.   Psychiatric/Behavioral: Negative for substance abuse.       Past Medical/Surgical History     Past Medical History:   Diagnosis Date   ??? Asthma    ??? Hypertension    ??? Neurological disorder     Bell's palsy     History reviewed. No pertinent surgical history.    Social History     Social History     Socioeconomic History   ??? Marital status: SINGLE     Spouse name: Not on file   ??? Number of children: Not on file   ??? Years of education: Not on file   ??? Highest education level: Not on file   Tobacco Use   ??? Smoking status: Never Smoker   ??? Smokeless tobacco: Never Used   Substance and Sexual Activity   ??? Alcohol use: No   ??? Drug use: No   ??? Sexual activity: Yes     Partners: Female     Birth control/protection: Condom       Family History   History reviewed. No pertinent family history.    Current Medications     Current Facility-Administered Medications   Medication Dose Route Frequency  Provider Last Rate Last Dose   ??? lactated Ringers infusion  100 mL/hr IntraVENous CONTINUOUS Matriano, Crista Curb, MD 100 mL/hr at 08/19/18 0245 100 mL/hr at 08/19/18 0245   ??? ondansetron (ZOFRAN) injection 4 mg  4 mg IntraVENous Q6H PRN Matriano, Crista Curb, MD   4 mg at 08/19/18 0245       Allergies   No Known Allergies    Physical Exam     Visit Vitals  BP 138/87 (BP 1 Location: Left arm, BP Patient Position: Supine)   Pulse 83   Temp 97.9 ??F (36.6 ??C)   Resp 16   Ht 5' 9"  (1.753 m)   Wt 61.6 kg (135 lb 12.9 oz)   SpO2 100%   BMI 20.05 kg/m??     Physical Exam  Constitutional:       General: He is in acute distress.      Appearance: He is ill-appearing and toxic-appearing. He is not diaphoretic.      Comments: Cachectic   HENT:      Head: Normocephalic and atraumatic.    Eyes:      Conjunctiva/sclera: Conjunctivae normal.      Pupils: Pupils are equal, round, and reactive to light.   Neck:      Musculoskeletal: Normal range of motion and neck supple.      Thyroid: No thyromegaly.      Trachea: No tracheal deviation.   Cardiovascular:      Rate and Rhythm: Normal rate and regular rhythm.      Heart sounds: Normal heart sounds. No murmur. No friction rub. No gallop.    Pulmonary:      Effort: Pulmonary effort is normal. No respiratory distress.      Breath sounds: Normal breath sounds. No stridor. No wheezing or rales.   Abdominal:      General: Bowel sounds are normal.      Palpations: Abdomen is soft. There is no mass.      Tenderness: There is generalized abdominal tenderness. There is no guarding or rebound.      Comments: Generalized abdominal pain which is worse in the epigastrium and lower, he is nondistended, he is non-peritoneal, Gastroccult test reveals no evidence of blood in his vomit.   Musculoskeletal: Normal range of motion.         General: No deformity.   Lymphadenopathy:      Cervical: No cervical adenopathy.   Skin:     General: Skin is warm and dry.      Findings: No rash.   Neurological:      Mental Status: He is alert and oriented to person, place, and time.   Psychiatric:         Mood and Affect: Affect normal.         Impression and Management Plan   28 year old recently diagnosed patient with HIV, GI infections as above here with persistent nausea vomiting and diarrhea, will recheck labs, his emesis is red, however it is Gastroccult negative, I think this is due to him drinking red Gatorade x2 in the last 48 hours.  Will check stool studies, reassess.    Diagnostic Studies   Lab:   Recent Results (from the past 12 hour(s))   CBC WITH AUTOMATED DIFF    Collection Time: 08/18/18 10:00 PM   Result Value Ref Range    WBC 4.8 4.0 - 11.0 1000/mm3    RBC 5.84 (H) 3.80 - 5.70 M/uL    HGB 16.4  12.4 - 17.2 gm/dl    HCT 46.4 37.0 - 50.0 %    MCV 79.5 (L) 80.0 - 98.0 fL     MCH 28.1 23.0 - 34.6 pg    MCHC 35.3 30.0 - 36.0 gm/dl    PLATELET 240 140 - 450 1000/mm3    MPV 12.2 (H) 6.0 - 10.0 fL    RDW-SD 42.0 35.1 - 43.9      NRBC 0 0 - 0      IMMATURE GRANULOCYTES 0.2 0.0 - 3.0 %    NEUTROPHILS 63.0 34 - 64 %    LYMPHOCYTES 21.0 (L) 28 - 48 %    MONOCYTES 10.4 1 - 13 %    EOSINOPHILS 5.2 (H) 0 - 5 %    BASOPHILS 0.2 0 - 3 %   METABOLIC PANEL, COMPREHENSIVE    Collection Time: 08/18/18 10:00 PM   Result Value Ref Range    Sodium 138 136 - 145 mEq/L    Potassium 3.3 (L) 3.5 - 5.1 mEq/L    Chloride 112 (H) 98 - 107 mEq/L    CO2 13 (LL) 21 - 32 mEq/L    Glucose 115 (H) 74 - 106 mg/dl    BUN 33 (H) 7 - 25 mg/dl    Creatinine 1.6 (H) 0.6 - 1.3 mg/dl    GFR est AA >60      GFR est non-AA 54      Calcium 9.1 8.5 - 10.1 mg/dl    AST (SGOT) 37 15 - 37 U/L    ALT (SGPT) 48 12 - 78 U/L    Alk. phosphatase 89 45 - 117 U/L    Bilirubin, total 0.5 0.2 - 1.0 mg/dl    Protein, total 9.7 (H) 6.4 - 8.2 gm/dl    Albumin 3.5 3.4 - 5.0 gm/dl    Anion gap 13 5 - 15 mmol/L   LIPASE    Collection Time: 08/18/18 10:00 PM   Result Value Ref Range    Lipase 203 73 - 393 U/L   MAGNESIUM    Collection Time: 08/18/18 10:00 PM   Result Value Ref Range    Magnesium 2.0 1.6 - 2.6 mg/dl   LACTIC ACID    Collection Time: 08/18/18 10:00 PM   Result Value Ref Range    Lactic Acid 1.6 0.4 - 2.0 mmol/L   C. DIFFICILE/EPI PCR    Collection Time: 08/19/18 12:00 AM   Result Value Ref Range    C. diff toxin by PCR Toxigenic C. difficile NEGATIVE Toxigenic C. difficile NEGATIVE     URINALYSIS W/ RFLX MICROSCOPIC    Collection Time: 08/19/18 12:01 AM   Result Value Ref Range    Color BROWN (A) YELLOW,STRAW      Appearance HAZY (A) CLEAR      Glucose NEGATIVE NEGATIVE,Negative mg/dl    Bilirubin MODERATE (A) NEGATIVE,Negative      Ketone 15 (A) NEGATIVE,Negative mg/dl    Specific gravity 1.025 1.005 - 1.030      Blood NEGATIVE NEGATIVE,Negative      pH (UA) 6.5 5.0 - 9.0      Protein 30 (A) NEGATIVE,Negative mg/dl     Urobilinogen 0.2 0.0 - 1.0 mg/dl    Nitrites POSITIVE (A) NEGATIVE,Negative      Leukocyte Esterase NEGATIVE NEGATIVE,Negative     POC URINE MICROSCOPIC    Collection Time: 08/19/18 12:01 AM   Result Value Ref Range    Epithelial cells, squamous OCCASIONAL /LPF    WBC 1-4 /  HPF    Bacteria OCCASIONAL /HPF    Hyaline cast OCCASIONAL /LPF    Coarse Granular casts OCCASIONAL /LPF   POC URINE MACROSCOPIC    Collection Time: 08/19/18 12:22 AM   Result Value Ref Range    Glucose Negative NEGATIVE,Negative mg/dl    Bilirubin Moderate (A) NEGATIVE,Negative      Ketone 15 (A) NEGATIVE,Negative mg/dl    Specific gravity >=1.030 1.005 - 1.030      Blood Negative NEGATIVE,Negative      pH (UA) 6.0 5 - 9      Protein 100 (A) NEGATIVE,Negative mg/dl    Urobilinogen 0.2 0.0 - 1.0 EU/dl    Nitrites Negative NEGATIVE,Negative      Leukocyte Esterase Negative NEGATIVE,Negative      Color Brown      Appearance Slightly Cloudy     GLUCOSE, POC    Collection Time: 08/19/18  2:49 AM   Result Value Ref Range    Glucose (POC) 80 65 - 105 mg/dL       Imaging:    No results found.        Medical Decision Making/ED Course   Labs notable for a progressive metabolic acidosis, acute renal insufficiency.  In the setting of a progressive acidosis, renal insufficiency, do not think discharge home is in his best interest, will admit for ongoing symptomatic therapy, stool cultures are pending at time of admission.  Patient is discussed with Dr. Illene Regulus who will admit primarily.  [facility] is guarded.           Final Diagnosis       ICD-10-CM ICD-9-CM   1. Metabolic acidosis T24.5 809.9   2. Dehydration E86.0 276.51   3. Diarrhea of presumed infectious origin R19.7 009.3   4. Acute renal failure, unspecified acute renal failure type [facility]) N17.9 584.9     Disposition   Admission    Mora Bellman, MD  August 19, 2018    My signature above authenticates this document and my orders, the final     diagnosis (es), discharge prescription (s), and instructions in the Epic    record.  If you have any questions please contact [phone].

## 2018-08-18 NOTE — ED Notes (Signed)
C/o abd pain, NVD since May 24th.    Was admitted for NVD here and discharged last Wed.

## 2018-08-18 NOTE — ED Notes (Signed)
Patient complaining of nausea, vomiting, and diarrhea since 07/16/2018.

## 2018-08-18 NOTE — ED Provider Notes (Signed)
[facility]  Emergency Department Treatment Report    Patient: Aaden Buckman IV Age: 28 y.o. Sex: male    Date of Birth: Jun 14, 1990 Admit Date: 08/18/2018 PCP: Janell Quiet, MD   MRN: [id number]  CSN: [id number]     Room: 2108/2108 Time Dictated: 10:11 PM      Dragon medical dictation software was used for portions of this report.  Unintended transcription errors may occur.    Chief Complaint   Nausea and vomiting  History of Present Illness   27 y.o. 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  States that he has been incessantly vomiting really since the end of May, did not really feel that much better after he left the hospital has been vomiting since roughly 2-3 times per day.  Noticed that his emesis turned red last night, he is unsure if this is blood or not, and then again turned red at this evening and then opted to check and to the hospital as the vomiting is profuse, he feels overall weak, dehydrated, and was so weak that he fell to the ground earlier today.  He has diffuse abdominal pain, but has had no fever.  He is noted no bloody stools.  Shortly after I walk into the room he realized that last night before he vomited he had a drink of red Gatorade, and then tonight also drank red Gatorade.  States that he has persistent profuse diarrhea, to many times to count during the day.    Review of Systems   Review of Systems   Constitutional: Positive for malaise/fatigue. Negative for chills, diaphoresis, fever and weight loss.   HENT: Negative for congestion and sore throat.    Respiratory: Negative for cough, sputum production, shortness of breath and wheezing.    Cardiovascular: Negative for chest pain, palpitations, orthopnea and leg swelling.   Gastrointestinal: Positive for abdominal pain, diarrhea, nausea and  vomiting.   Genitourinary: Negative for dysuria, frequency and urgency.   Musculoskeletal: Negative for back pain and falls.   Skin: Negative for rash.   Neurological: Negative for dizziness and headaches.   Endo/Heme/Allergies: Does not bruise/bleed easily.   Psychiatric/Behavioral: Negative for substance abuse.       Past Medical/Surgical History     Past Medical History:   Diagnosis Date   ??? Asthma    ??? Hypertension    ??? Neurological disorder     Bell's palsy     History reviewed. No pertinent surgical history.    Social History     Social History     Socioeconomic History   ??? Marital status: SINGLE     Spouse name: Not on file   ??? Number of children: Not on file   ??? Years of education: Not on file   ??? Highest education level: Not on file   Tobacco Use   ??? Smoking status: Never Smoker   ??? Smokeless tobacco: Never Used   Substance and Sexual Activity   ??? Alcohol use: No   ??? Drug use: No   ??? Sexual activity: Yes     Partners: Female     Birth control/protection: Condom       Family History   History reviewed. No pertinent family history.    Current Medications     Current Facility-Administered Medications   Medication Dose Route Frequency  Provider Last Rate Last Dose   ??? lactated Ringers infusion  100 mL/hr IntraVENous CONTINUOUS Matriano, Crista Curb, MD 100 mL/hr at 08/19/18 0245 100 mL/hr at 08/19/18 0245   ??? ondansetron (ZOFRAN) injection 4 mg  4 mg IntraVENous Q6H PRN Matriano, Crista Curb, MD   4 mg at 08/19/18 0245       Allergies   No Known Allergies    Physical Exam     Visit Vitals  BP 138/87 (BP 1 Location: Left arm, BP Patient Position: Supine)   Pulse 83   Temp 97.9 ??F (36.6 ??C)   Resp 16   Ht 5' 9"  (1.753 m)   Wt 61.6 kg (135 lb 12.9 oz)   SpO2 100%   BMI 20.05 kg/m??     Physical Exam  Constitutional:       General: He is in acute distress.      Appearance: He is ill-appearing and toxic-appearing. He is not diaphoretic.      Comments: Cachectic   HENT:      Head: Normocephalic and atraumatic.   Eyes:       Conjunctiva/sclera: Conjunctivae normal.      Pupils: Pupils are equal, round, and reactive to light.   Neck:      Musculoskeletal: Normal range of motion and neck supple.      Thyroid: No thyromegaly.      Trachea: No tracheal deviation.   Cardiovascular:      Rate and Rhythm: Normal rate and regular rhythm.      Heart sounds: Normal heart sounds. No murmur. No friction rub. No gallop.    Pulmonary:      Effort: Pulmonary effort is normal. No respiratory distress.      Breath sounds: Normal breath sounds. No stridor. No wheezing or rales.   Abdominal:      General: Bowel sounds are normal.      Palpations: Abdomen is soft. There is no mass.      Tenderness: There is generalized abdominal tenderness. There is no guarding or rebound.      Comments: Generalized abdominal pain which is worse in the epigastrium and lower, he is nondistended, he is non-peritoneal, Gastroccult test reveals no evidence of blood in his vomit.   Musculoskeletal: Normal range of motion.         General: No deformity.   Lymphadenopathy:      Cervical: No cervical adenopathy.   Skin:     General: Skin is warm and dry.      Findings: No rash.   Neurological:      Mental Status: He is alert and oriented to person, place, and time.   Psychiatric:         Mood and Affect: Affect normal.         Impression and Management Plan   28 year old recently diagnosed patient with HIV, GI infections as above here with persistent nausea vomiting and diarrhea, will recheck labs, his emesis is red, however it is Gastroccult negative, I think this is due to him drinking red Gatorade x2 in the last 48 hours.  Will check stool studies, reassess.    Diagnostic Studies   Lab:   Recent Results (from the past 12 hour(s))   CBC WITH AUTOMATED DIFF    Collection Time: 08/18/18 10:00 PM   Result Value Ref Range    WBC 4.8 4.0 - 11.0 1000/mm3    RBC 5.84 (H) 3.80 - 5.70 M/uL    HGB 16.4  12.4 - 17.2 gm/dl    HCT 46.4 37.0 - 50.0 %    MCV 79.5 (L) 80.0 - 98.0 fL    MCH  28.1 23.0 - 34.6 pg    MCHC 35.3 30.0 - 36.0 gm/dl    PLATELET 240 140 - 450 1000/mm3    MPV 12.2 (H) 6.0 - 10.0 fL    RDW-SD 42.0 35.1 - 43.9      NRBC 0 0 - 0      IMMATURE GRANULOCYTES 0.2 0.0 - 3.0 %    NEUTROPHILS 63.0 34 - 64 %    LYMPHOCYTES 21.0 (L) 28 - 48 %    MONOCYTES 10.4 1 - 13 %    EOSINOPHILS 5.2 (H) 0 - 5 %    BASOPHILS 0.2 0 - 3 %   METABOLIC PANEL, COMPREHENSIVE    Collection Time: 08/18/18 10:00 PM   Result Value Ref Range    Sodium 138 136 - 145 mEq/L    Potassium 3.3 (L) 3.5 - 5.1 mEq/L    Chloride 112 (H) 98 - 107 mEq/L    CO2 13 (LL) 21 - 32 mEq/L    Glucose 115 (H) 74 - 106 mg/dl    BUN 33 (H) 7 - 25 mg/dl    Creatinine 1.6 (H) 0.6 - 1.3 mg/dl    GFR est AA >60      GFR est non-AA 54      Calcium 9.1 8.5 - 10.1 mg/dl    AST (SGOT) 37 15 - 37 U/L    ALT (SGPT) 48 12 - 78 U/L    Alk. phosphatase 89 45 - 117 U/L    Bilirubin, total 0.5 0.2 - 1.0 mg/dl    Protein, total 9.7 (H) 6.4 - 8.2 gm/dl    Albumin 3.5 3.4 - 5.0 gm/dl    Anion gap 13 5 - 15 mmol/L   LIPASE    Collection Time: 08/18/18 10:00 PM   Result Value Ref Range    Lipase 203 73 - 393 U/L   MAGNESIUM    Collection Time: 08/18/18 10:00 PM   Result Value Ref Range    Magnesium 2.0 1.6 - 2.6 mg/dl   LACTIC ACID    Collection Time: 08/18/18 10:00 PM   Result Value Ref Range    Lactic Acid 1.6 0.4 - 2.0 mmol/L   C. DIFFICILE/EPI PCR    Collection Time: 08/19/18 12:00 AM   Result Value Ref Range    C. diff toxin by PCR Toxigenic C. difficile NEGATIVE Toxigenic C. difficile NEGATIVE     URINALYSIS W/ RFLX MICROSCOPIC    Collection Time: 08/19/18 12:01 AM   Result Value Ref Range    Color BROWN (A) YELLOW,STRAW      Appearance HAZY (A) CLEAR      Glucose NEGATIVE NEGATIVE,Negative mg/dl    Bilirubin MODERATE (A) NEGATIVE,Negative      Ketone 15 (A) NEGATIVE,Negative mg/dl    Specific gravity 1.025 1.005 - 1.030      Blood NEGATIVE NEGATIVE,Negative      pH (UA) 6.5 5.0 - 9.0      Protein 30 (A) NEGATIVE,Negative mg/dl    Urobilinogen 0.2 0.0  - 1.0 mg/dl    Nitrites POSITIVE (A) NEGATIVE,Negative      Leukocyte Esterase NEGATIVE NEGATIVE,Negative     POC URINE MICROSCOPIC    Collection Time: 08/19/18 12:01 AM   Result Value Ref Range    Epithelial cells, squamous OCCASIONAL /LPF    WBC 1-4 /  HPF    Bacteria OCCASIONAL /HPF    Hyaline cast OCCASIONAL /LPF    Coarse Granular casts OCCASIONAL /LPF   POC URINE MACROSCOPIC    Collection Time: 08/19/18 12:22 AM   Result Value Ref Range    Glucose Negative NEGATIVE,Negative mg/dl    Bilirubin Moderate (A) NEGATIVE,Negative      Ketone 15 (A) NEGATIVE,Negative mg/dl    Specific gravity >=1.030 1.005 - 1.030      Blood Negative NEGATIVE,Negative      pH (UA) 6.0 5 - 9      Protein 100 (A) NEGATIVE,Negative mg/dl    Urobilinogen 0.2 0.0 - 1.0 EU/dl    Nitrites Negative NEGATIVE,Negative      Leukocyte Esterase Negative NEGATIVE,Negative      Color Brown      Appearance Slightly Cloudy     GLUCOSE, POC    Collection Time: 08/19/18  2:49 AM   Result Value Ref Range    Glucose (POC) 80 65 - 105 mg/dL       Imaging:    No results found.        Medical Decision Making/ED Course   Labs notable for a progressive metabolic acidosis, acute renal insufficiency.  In the setting of a progressive acidosis, renal insufficiency, do not think discharge home is in his best interest, will admit for ongoing symptomatic therapy, stool cultures are pending at time of admission.  Patient is discussed with Dr. Illene Regulus who will admit primarily.  [place] is guarded.           Final Diagnosis       ICD-10-CM ICD-9-CM   1. Metabolic acidosis T61.4 431.5   2. Dehydration E86.0 276.51   3. Diarrhea of presumed infectious origin R19.7 009.3   4. Acute renal failure, unspecified acute renal failure type [facility]) N17.9 584.9     Disposition   Admission    Mora Bellman, MD  August 19, 2018    My signature above authenticates this document and my orders, the final    diagnosis (es), discharge prescription (s), and instructions in the  [facility]    record.  If you have any questions please contact [phone].

## 2018-08-18 NOTE — ED Notes (Signed)
Pt is actively vomiting.    Pt just started complaining of chest discomfort when he went from wheelchair and pivoted to the bed.    Primary RN notified of pt's new complaint.    Family at bedside.

## 2018-08-19 ENCOUNTER — Inpatient Hospital Stay
Admit: 2018-08-19 | Discharge: 2018-08-26 | Disposition: A | Discharge status: Home / Self Care | Payer: MEDICAID | Attending: Internal Medicine | Admitting: Internal Medicine

## 2018-08-19 DIAGNOSIS — B20 Human immunodeficiency virus [HIV] disease: Secondary | ICD-10-CM | POA: Insufficient documentation

## 2018-08-19 DIAGNOSIS — R197 Diarrhea, unspecified: Secondary | ICD-10-CM | POA: Insufficient documentation

## 2018-08-19 LAB — COMPREHENSIVE METABOLIC PANEL
ALT: 48 U/L (ref 12–78)
AST: 37 U/L (ref 15–37)
Albumin: 3.5 gm/dl (ref 3.4–5.0)
Alkaline Phosphatase: 89 U/L (ref 45–117)
Anion Gap: 13 mmol/L (ref 5–15)
BUN: 33 mg/dl — ABNORMAL HIGH (ref 7–25)
CO2: 13 mEq/L — CL (ref 21–32)
Calcium: 9.1 mg/dl (ref 8.5–10.1)
Chloride: 112 mEq/L — ABNORMAL HIGH (ref 98–107)
Creatinine: 1.6 mg/dl — ABNORMAL HIGH (ref 0.6–1.3)
EGFR IF NonAfrican American: 54
GFR African American: >60
Glucose: 115 mg/dl — ABNORMAL HIGH (ref 74–106)
Potassium: 3.3 mEq/L — ABNORMAL LOW (ref 3.5–5.1)
Sodium: 138 mEq/L (ref 136–145)
Total Bilirubin: 0.5 mg/dl (ref 0.2–1.0)
Total Protein: 9.7 gm/dl — ABNORMAL HIGH (ref 6.4–8.2)

## 2018-08-19 LAB — URINALYSIS W/ RFLX MICROSCOPIC
Blood, Urine: NEGATIVE
Blood: NEGATIVE
Glucose, Ur: NEGATIVE mg/dl
Glucose: NEGATIVE mg/dl
Ketone: 15 mg/dl — AB
Ketones, Urine: 15 mg/dl — AB
Leukocyte Esterase, Urine: NEGATIVE
Leukocyte Esterase: NEGATIVE
Nitrite, Urine: POSITIVE — AB
Nitrites: POSITIVE — AB
Protein, UA: 30 mg/dl — AB
Protein: 30 mg/dl — AB
Specific Gravity, UA: 1.025 (ref 1.005–1.030)
Specific gravity: 1.025 (ref 1.005–1.030)
Urobilinogen, UA, POCT: 0.2 mg/dl (ref 0.0–1.0)
Urobilinogen: 0.2 mg/dl (ref 0.0–1.0)
pH (UA): 6.5 (ref 5.0–9.0)
pH, UA: 6.5 (ref 5.0–9.0)

## 2018-08-19 LAB — POC URINE MICROSCOPIC

## 2018-08-19 LAB — LIPASE
Lipase: 203 U/L (ref 73–393)
Lipase: 203 U/L (ref 73–393)

## 2018-08-19 LAB — CBC WITH AUTO DIFFERENTIAL
Basophils %: 0.2 % (ref 0–3)
Eosinophils %: 5.2 % — ABNORMAL HIGH (ref 0–5)
Hematocrit: 46.4 % (ref 37.0–50.0)
Hemoglobin: 16.4 gm/dl (ref 12.4–17.2)
Immature Granulocytes: 0.2 % (ref 0.0–3.0)
Lymphocytes %: 21 % — ABNORMAL LOW (ref 28–48)
MCH: 28.1 pg (ref 23.0–34.6)
MCHC: 35.3 gm/dl (ref 30.0–36.0)
MCV: 79.5 fL — ABNORMAL LOW (ref 80.0–98.0)
MPV: 12.2 fL — ABNORMAL HIGH (ref 6.0–10.0)
Monocytes %: 10.4 % (ref 1–13)
Neutrophils %: 63 % (ref 34–64)
Nucleated RBCs: 0 (ref 0–0)
Platelets: 240 10*3/uL (ref 140–450)
RBC: 5.84 M/uL — ABNORMAL HIGH (ref 3.80–5.70)
RDW-SD: 42 (ref 35.1–43.9)
WBC: 4.8 10*3/uL (ref 4.0–11.0)

## 2018-08-19 LAB — SODIUM, UR, RANDOM
Sodium,Ur: 51 mEq/L (ref 20–110)
Sodium,urine random: 51 mEq/L (ref 20–110)

## 2018-08-19 LAB — POC URINE MACROSCOPIC
Blood, Urine: NEGATIVE
Blood: NEGATIVE
Glucose, Ur: NEGATIVE mg/dl
Glucose: NEGATIVE mg/dl
Ketone: 15 mg/dl — AB
Ketones, Urine: 15 mg/dl — AB
Leukocyte Esterase, Urine: NEGATIVE
Leukocyte Esterase: NEGATIVE
Nitrite, Urine: NEGATIVE
Nitrites: NEGATIVE
Protein, UA: 100 mg/dl — AB
Protein: 100 mg/dl — AB
Specific Gravity, UA: >=1.03 (ref 1.005–1.030)
Specific gravity: >=1.03 (ref 1.005–1.030)
Urobilinogen, UA, POCT: 0.2 EU/dl (ref 0.0–1.0)
Urobilinogen: 0.2 EU/dl (ref 0.0–1.0)
pH (UA): 6 (ref 5–9)
pH, UA: 6 (ref 5–9)

## 2018-08-19 LAB — LACTIC ACID
LACTIC ACID: 1.6 mmol/L (ref 0.4–2.0)
Lactic Acid: 1.6 mmol/L (ref 0.4–2.0)

## 2018-08-19 LAB — CHLORIDE, URINE RANDOM
Chloride, Random, Urine: 109 mEq/L (ref 55–125)
Chloride,urine random: 109 mEq/L (ref 55–125)

## 2018-08-19 LAB — C. DIFFICILE/EPI PCR
C. DIFF TOXIN BY PCR: NEGATIVE
C. diff toxin by PCR: NEGATIVE

## 2018-08-19 LAB — POTASSIUM, UR, RANDOM
Potassium urine, random: 11 mEq/L — ABNORMAL LOW (ref 12.0–75.0)
Potassium, Random, Urine: 11 mEq/L — ABNORMAL LOW (ref 12.0–75.0)

## 2018-08-19 LAB — POCT GLUCOSE: POC Glucose: 80 mg/dL (ref 65–105)

## 2018-08-19 LAB — MAGNESIUM
Magnesium: 2 mg/dl (ref 1.6–2.6)
Magnesium: 2 mg/dl (ref 1.6–2.6)

## 2018-08-19 LAB — METABOLIC PANEL, COMPREHENSIVE
ALT (SGPT): 48 U/L (ref 12–78)
AST (SGOT): 37 U/L (ref 15–37)
Albumin: 3.5 gm/dl (ref 3.4–5.0)
Alk. phosphatase: 89 U/L (ref 45–117)
Anion gap: 13 mmol/L (ref 5–15)
BUN: 33 mg/dl — ABNORMAL HIGH (ref 7–25)
Bilirubin, total: 0.5 mg/dl (ref 0.2–1.0)
CO2: 13 mEq/L — CL (ref 21–32)
Calcium: 9.1 mg/dl (ref 8.5–10.1)
Chloride: 112 mEq/L — ABNORMAL HIGH (ref 98–107)
Creatinine: 1.6 mg/dl — ABNORMAL HIGH (ref 0.6–1.3)
GFR est AA: >60
GFR est non-AA: 54
Glucose: 115 mg/dl — ABNORMAL HIGH (ref 74–106)
Potassium: 3.3 mEq/L — ABNORMAL LOW (ref 3.5–5.1)
Protein, total: 9.7 gm/dl — ABNORMAL HIGH (ref 6.4–8.2)
Sodium: 138 mEq/L (ref 136–145)

## 2018-08-19 LAB — CBC WITH AUTOMATED DIFF
BASOPHILS: 0.2 % (ref 0–3)
EOSINOPHILS: 5.2 % — ABNORMAL HIGH (ref 0–5)
HCT: 46.4 % (ref 37.0–50.0)
HGB: 16.4 gm/dl (ref 12.4–17.2)
IMMATURE GRANULOCYTES: 0.2 % (ref 0.0–3.0)
LYMPHOCYTES: 21 % — ABNORMAL LOW (ref 28–48)
MCH: 28.1 pg (ref 23.0–34.6)
MCHC: 35.3 gm/dl (ref 30.0–36.0)
MCV: 79.5 fL — ABNORMAL LOW (ref 80.0–98.0)
MONOCYTES: 10.4 % (ref 1–13)
MPV: 12.2 fL — ABNORMAL HIGH (ref 6.0–10.0)
NEUTROPHILS: 63 % (ref 34–64)
NRBC: 0 (ref 0–0)
PLATELET: 240 10*3/uL (ref 140–450)
RBC: 5.84 M/uL — ABNORMAL HIGH (ref 3.80–5.70)
RDW-SD: 42 (ref 35.1–43.9)
WBC: 4.8 10*3/uL (ref 4.0–11.0)

## 2018-08-19 LAB — GLUCOSE, POC: Glucose (POC): 80 mg/dL (ref 65–105)

## 2018-08-19 MED ORDER — LACTATED RINGERS IV
INTRAVENOUS | Status: DC
Start: 2018-08-19 — End: 2018-08-19
  Administered 2018-08-19: 07:00:00 via INTRAVENOUS

## 2018-08-19 MED ORDER — METRONIDAZOLE IN SODIUM CHLORIDE (ISO-OSM) 500 MG/100 ML IV PIGGY BACK
500 mg/100 mL | Freq: Three times a day (TID) | INTRAVENOUS | Status: DC
Start: 2018-08-19 — End: 2018-08-19
  Administered 2018-08-19: 14:00:00 via INTRAVENOUS

## 2018-08-19 MED ORDER — FLUCONAZOLE IN SALINE (ISO-OSMOTIC) 200 MG/100 ML IV PIGGY BACK
200 mg/100 mL | INTRAVENOUS | Status: DC
Start: 2018-08-19 — End: 2018-08-26
  Administered 2018-08-19 – 2018-08-26 (×8): via INTRAVENOUS

## 2018-08-19 MED ORDER — DICYCLOMINE 10 MG CAP
10 mg | Freq: Four times a day (QID) | ORAL | Status: DC | PRN
Start: 2018-08-19 — End: 2018-08-23
  Administered 2018-08-20 – 2018-08-21 (×6): via ORAL

## 2018-08-19 MED ORDER — ALBENDAZOLE 200 MG TAB
200 mg | Freq: Two times a day (BID) | ORAL | Status: DC
Start: 2018-08-19 — End: 2018-08-22
  Administered 2018-08-19 – 2018-08-22 (×6): via ORAL

## 2018-08-19 MED ORDER — IPRATROPIUM-ALBUTEROL 2.5 MG-0.5 MG/3 ML NEB SOLUTION
2.5 mg-0.5 mg/3 ml | Freq: Four times a day (QID) | RESPIRATORY_TRACT | Status: DC | PRN
Start: 2018-08-19 — End: 2018-08-26

## 2018-08-19 MED ORDER — NALOXONE 0.4 MG/ML INJECTION
0.4 mg/mL | INTRAMUSCULAR | Status: DC | PRN
Start: 2018-08-19 — End: 2018-08-26

## 2018-08-19 MED ORDER — POTASSIUM CHLORIDE SR 20 MEQ TAB, PARTICLES/CRYSTALS
20 mEq | Freq: Two times a day (BID) | ORAL | Status: DC
Start: 2018-08-19 — End: 2018-08-19

## 2018-08-19 MED ORDER — NYSTATIN 100,000 UNIT/ML ORAL SUSP
100000 unit/mL | Freq: Four times a day (QID) | ORAL | Status: DC
Start: 2018-08-19 — End: 2018-08-19

## 2018-08-19 MED ORDER — ONDANSETRON (PF) 4 MG/2 ML INJECTION
4 mg/2 mL | Freq: Four times a day (QID) | INTRAMUSCULAR | Status: DC | PRN
Start: 2018-08-19 — End: 2018-08-21
  Administered 2018-08-19 – 2018-08-21 (×9): via INTRAVENOUS

## 2018-08-19 MED ORDER — TRIMETHOPRIM-SULFAMETHOXAZOLE 160 MG-800 MG TAB
160-800 mg | ORAL | Status: DC
Start: 2018-08-19 — End: 2018-08-26
  Administered 2018-08-21 – 2018-08-26 (×3): via ORAL

## 2018-08-19 MED ORDER — EMPTY CONTAINER (INTRAVIA BAG)
500100 mg/100 mL | Freq: Two times a day (BID) | Status: DC
Start: 2018-08-19 — End: 2018-08-21
  Administered 2018-08-19 – 2018-08-21 (×4): via INTRAVENOUS

## 2018-08-19 MED ORDER — SODIUM CHLORIDE 0.9% BOLUS IV
0.9 % | INTRAVENOUS | Status: AC
Start: 2018-08-19 — End: 2018-08-19
  Administered 2018-08-19: 03:00:00 via INTRAVENOUS

## 2018-08-19 MED ORDER — AZITHROMYCIN 600 MG TAB
600 mg | ORAL | Status: DC
Start: 2018-08-19 — End: 2018-08-19
  Administered 2018-08-19: 18:00:00 via ORAL

## 2018-08-19 MED ORDER — SODIUM BICARBONATE 650 MG TAB
650 mg | Freq: Three times a day (TID) | ORAL | Status: DC
Start: 2018-08-19 — End: 2018-08-25
  Administered 2018-08-19 – 2018-08-25 (×17): via ORAL

## 2018-08-19 MED ORDER — NITAZOXANIDE 500 MG TAB
500 mg | Freq: Two times a day (BID) | ORAL | Status: DC
Start: 2018-08-19 — End: 2018-08-19
  Administered 2018-08-19: 18:00:00 via ORAL

## 2018-08-19 MED ORDER — SODIUM CHLORIDE 0.9 % IJ SYRG
Freq: Once | INTRAMUSCULAR | Status: AC
Start: 2018-08-19 — End: 2018-08-18
  Administered 2018-08-19: 03:00:00 via INTRAVENOUS

## 2018-08-19 MED ORDER — ACETAMINOPHEN 325 MG TABLET
325 mg | Freq: Four times a day (QID) | ORAL | Status: DC | PRN
Start: 2018-08-19 — End: 2018-08-26

## 2018-08-19 MED ORDER — ONDANSETRON (PF) 4 MG/2 ML INJECTION
4 mg/2 mL | Freq: Once | INTRAMUSCULAR | Status: AC
Start: 2018-08-19 — End: 2018-08-18
  Administered 2018-08-19: 03:00:00 via INTRAVENOUS

## 2018-08-19 MED ORDER — ENOXAPARIN 30 MG/0.3 ML SUB-Q SYRINGE
30 mg/0.3 mL | SUBCUTANEOUS | Status: DC
Start: 2018-08-19 — End: 2018-08-26
  Administered 2018-08-19 – 2018-08-20 (×2): via SUBCUTANEOUS

## 2018-08-19 MED FILL — METRONIDAZOLE IN SODIUM CHLORIDE (ISO-OSM) 500 MG/100 ML IV PIGGY BACK: 500 mg/100 mL | INTRAVENOUS | Qty: 100

## 2018-08-19 MED FILL — ALBENDAZOLE 200 MG TAB: 200 mg | ORAL | Qty: 2

## 2018-08-19 MED FILL — SODIUM BICARBONATE 650 MG TAB: 650 mg | ORAL | Qty: 2

## 2018-08-19 MED FILL — ONDANSETRON (PF) 4 MG/2 ML INJECTION: 4 mg/2 mL | INTRAMUSCULAR | Qty: 2

## 2018-08-19 MED FILL — AZITHROMYCIN 600 MG TAB: 600 mg | ORAL | Qty: 2

## 2018-08-19 MED FILL — LOVENOX 30 MG/0.3 ML SUB-Q SYRINGE: 30 mg/0.3 mL | SUBCUTANEOUS | Qty: 0.3

## 2018-08-19 MED FILL — FLUCONAZOLE IN SALINE (ISO-OSMOTIC) 200 MG/100 ML IV PIGGY BACK: 200 mg/100 mL | INTRAVENOUS | Qty: 100

## 2018-08-19 MED FILL — ALINIA 500 MG TABLET: 500 mg | ORAL | Qty: 1

## 2018-08-19 MED FILL — LACTATED RINGERS IV: INTRAVENOUS | Qty: 1000

## 2018-08-19 MED FILL — NYSTATIN 100,000 UNIT/ML ORAL SUSP: 100000 unit/mL | ORAL | Qty: 5

## 2018-08-19 NOTE — Progress Notes (Signed)
Problem: Pain  Goal: *Control of Pain  Outcome: Progressing Towards Goal

## 2018-08-19 NOTE — ED Notes (Signed)
TRANSFER - OUT REPORT:    Verbal report given to Rachel, RN(name) on Travis [name]  being transferred to [facility] [address] (unit) for routine progression of care       Report consisted of patient???s Situation, Background, Assessment and   Recommendations(SBAR).     Information from the following report(s) SBAR was reviewed with the receiving nurse.    Lines:   Peripheral [name] 08/18/18 Right Antecubital (Active)   Site Assessment Clean, dry, & intact 08/18/2018 10:49 PM   Phlebitis Assessment 0 08/18/2018 10:49 PM   Infiltration Assessment 0 08/18/2018 10:49 PM   Dressing Status Clean, dry, & intact 08/18/2018 10:49 PM   Dressing Type Transparent 08/18/2018 10:49 PM   Hub Color/Line Status Pink;Flushed 08/18/2018 10:49 PM   Action Taken Blood drawn 08/18/2018 10:49 PM        Opportunity for questions and clarification was provided.      Patient transported with:   Tech

## 2018-08-19 NOTE — Progress Notes (Signed)
Problem: Pain  Goal: *Control of Pain  Outcome: Progressing Towards Goal     Problem: Falls - Risk of  Goal: *Absence of Falls  Description: Document Schmid Fall Risk and appropriate interventions in the flowsheet.  Outcome: Progressing Towards Goal  Note: Fall Risk Interventions:  Mobility Interventions: Assess mobility with egress test, Bed/chair exit alarm, Communicate number of staff needed for ambulation/transfer, Patient to call before getting OOB     Medication Interventions: Assess postural VS orthostatic hypotension, Bed/chair exit alarm, Patient to call before getting OOB, Teach patient to arise slowly    Elimination Interventions: Bed/chair exit alarm, Call light in reach    History of Falls Interventions: Bed/chair exit alarm, Vital signs minimum Q4HRs X 24 hrs (comment for end date)

## 2018-08-19 NOTE — Consults (Signed)
INFECTIOUS DISEASE CONSULT NOTE       Requested by: Dr Ninetta Lights    Reason for consult: N/V/D in immunocopromised host    Date of admission: 08/18/2018    Date of consult: August 19, 2018      ABX:      Current abx Prior abx    Albendazole 400 mg bid  6/24   X 7 d  Flagyl 250 mg po bid x 7 d    Fluconazole 6/24  nitazoxinide 6/13    11d      ASSESSMENT:        N/V/D/ dehydration    Uncontrolled parasitic infection of GI ( see below) likely-     Giardia should have responded but 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 in HIV- reconstitution of immune system with ART will help immensely     Cannot start ART till genomic testing reviewed     Will initiate albendazol/flagyl  For rx of recalcitrant giardia    Oral candidiasis and suspect esophagitis      cryptosporidia/ Giardia concomitant infection   source unknown     D 11 of nitazoxinide       HIV- new diagnosis    VL > 1 mil    cd4  17 abs/ 2%    hiv genomic testing for resistance sent 6/15- still IP     Once available, start ART    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    neutropenia with adequate anc                nkda            RECOMMENDATIONS:      Begin albendazol 400 mg po bid plus flagyl 250 mg IV bid x 7 d  Rehydration per IM  Agree with fluconazole  Hold azithro for now and change pcp prophylaxis to bactrim DS MWF for now  Consider repeat CT abd/pelvis with oral contrast- eval for adenopathy and bowel integrity            HIV testing + HIV An, pcr,      cd4  2%; 17 absolute   , genomic testing for resistance ordered 6/15- IP                               MICROBIOLOGY:       PTA  5/27  Neg c diff  6/2    Stool culture neg  6/12  + for BOTH giardia and cryptosporidia-- lab repeated test with same results    Currrent  6/24  Stool neg CDI           Stool cullture IP        HPI:     28 y.o. 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  Was improving with less frequent bm till last sat- then N/V/and increased diarrhea.  He vomited up his nitazoxinide on sat but was able to keep it down therafter.  Noticed that his emesis turned red last night, he is unsure if this is blood or not, and then again turned red at this evening and then opted to check and to the hospital as the vomiting is profuse, he feels overall weak, dehydrated, and was so weak that he fell to the ground earlier today.  He has diffuse abdominal pain,  but has had no fever.  He is noted no bloody stools.  Currently States that he has persistent profuse diarrhea, to many times to count during the day.  He went to [facility]  last Friday and was interviewed by case worker and blood was drawn.  He did not see md  Nor was he prescribed ART.  ??  Past medical history:     Past Medical History:   Diagnosis Date   ??? Asthma    ??? Hypertension    ??? Neurological disorder     Bell's palsy       History reviewed. No pertinent surgical history.     Social History:     Social History     Socioeconomic History   ??? Marital status: SINGLE     Spouse name: Not on file   ??? Number of children: Not on file   ??? Years of education: Not on file   ??? Highest education level: Not on file   Occupational History   ??? Not on file   Social Needs   ??? Financial resource strain: Not on file   ??? Food insecurity     Worry: Not on file     Inability: Not on file   ??? Transportation needs     Medical: Not on file     Non-medical: Not on file   Tobacco Use   ??? Smoking status: Never Smoker   ??? Smokeless tobacco: Never Used   Substance and Sexual Activity   ??? Alcohol use: No   ??? Drug use: No   ??? Sexual activity: Yes     Partners: Female     Birth control/protection: Condom   Lifestyle   ??? Physical activity     Days per week: Not on file     Minutes per session: Not on file   ??? Stress: Not on file   Relationships   ??? Social Chief Strategy Officer on phone: Not on file     Gets together: Not on file     Attends religious service: Not on file     Active member of club or organization: Not on file     Attends meetings of clubs or organizations: Not on file     Relationship status: Not on file   ??? Intimate partner violence     Fear of current or ex partner: Not on file     Emotionally abused: Not on file     Physically abused: Not on file     Forced sexual activity: Not on file   Other Topics Concern   ??? Not on file   Social History Narrative   ??? Not on file       Family History:   History reviewed. No pertinent family history.    Allergies:   No Known Allergies      Home Medications:     Medications Prior to Admission   Medication Sig   ??? nitazoxanide (Alinia) 500 mg tablet Take 500 mg by mouth every twelve (12) hours.   ??? potassium chloride (K-DUR, KLOR-CON) 20 mEq tablet Take 1 Tab by mouth two (2) times a day.   ??? dicyclomine (BENTYL) 10 mg capsule Take 1 Cap by mouth every six (6) hours as needed for Abdominal Cramps.   ??? ondansetron (ZOFRAN ODT) 4 mg disintegrating tablet Take 1 Tab by mouth every eight (8) hours as needed for Nausea or Vomiting.   ??? trimethoprim-sulfamethoxazole (Bactrim  DS) 160-800 mg per tablet Take 1 Tab by mouth daily. Indications: Pneumocystis jiroveci pneumonia prevention   ??? azithromycin (ZITHROMAX) 600 mg tablet Take 2 Tabs by mouth every seven (7) days. Indications: prevention of Mycobacterium avium complex disease   ??? albuterol (PROAIR HFA) 90 mcg/actuation inhaler Take 1 Puff by inhalation every four (4) hours as needed for Wheezing.       Current Medications:     Current Facility-Administered Medications   Medication Dose Route Frequency   ??? lactated Ringers infusion  100 mL/hr IntraVENous CONTINUOUS   ??? ondansetron (ZOFRAN) injection 4 mg  4 mg IntraVENous Q6H PRN   ??? metroNIDAZOLE (FLAGYL) IVPB premix 500 mg  500 mg IntraVENous Q8H   ??? acetaminophen (TYLENOL) tablet 650 mg  650 mg Oral Q6H PRN    ??? nystatin (MYCOSTATIN) 100,000 unit/mL oral suspension 500,000 Units  500,000 Units Oral QID       Review of Systems:   12 points ROS done. Pertinent positives and negatives are as follows, ROS otherwise negative.    Constitutional: Negative for fever, chills, diaphoresis. No unexpected weight change.   HENT: Negative for ear pain, congestion, sore throat, rhinorrhea.   Eyes: Negative for pain, redness and visual disturbance.   Respiratory: negative for shortness of breath, cough, chest tightness, wheezing.   Cardiovascular: Negative for chest pain, palpitations and leg swelling.   Gastrointestinal: seehpi  Genitourinary: Negative for dysuria   Musculoskeletal: Negative for back pain, joint pain or muscle aches   Skin: Negative for ulcers, rash  Neurological: Negative for dizziness, syncope, light-headedness or headaches.   Hematological:Negative for easy bruising or bleeding.   Psychiatric/Behavioral: Negative anxiety, depression.      Physical Exam:  Vitals  Temp (24hrs), Avg:98 ??F (36.7 ??C), Min:97.7 ??F (36.5 ??C), Max:98.4 ??F (36.9 ??C)    Visit Vitals  BP (!) 134/94 (BP Patient Position: Supine)   Pulse 83   Temp 97.8 ??F (36.6 ??C)   Resp 18   Ht _0  (1.753 m)   Wt 61.6 kg (135 lb 12.9 oz)   SpO2 100%   BMI 20.05 kg/m??       General: Well-developed, 28 y.o. year-old, male, in no acute distress  HEENT: Normocephalic, anicteric sclerae, Pupils equal, round reactive to light, no oropharyngeal lesions. No sinus tenderness.  Neck: Supple, no lymphadenopathy, masses or thyromegaly  Chest: Symmetrical expansion  Lungs: Clear to auscultation bilaterally, no dullness  Heart: Regular rhythm, no murmur, no rub or gallop, No JVD  Abdomen: Soft, TTP, diffusely. No rebound.non distended, no organomegaly, BS+. Neg guiac of emesis in ED  Musculoskeletal: Normal strength/tone. No edema. No clubbing or cyanosis  CNS: AAOx3. Cranial nerves II-XII intact. Grossly normal.No NR  SKIN: No skin lesion or rash. Dry, warm, intact           Labs: Results:   Chemistry Recent Labs     08/18/18  2200   GLU 115*   NA 138   K 3.3*   CL 112*   CO2 13*   BUN 33*   CREA 1.6*   CA 9.1   AGAP 13   AP 89   TP 9.7*   ALB 3.5      CBC w/Diff Recent Labs     08/18/18  2200   WBC 4.8   RBC 5.84*   HGB 16.4   HCT 46.4   PLT 240   GRANS 63.0   LYMPH 21.0*   EOS 5.2*  Microbiology No results for input(s): CULT in the last 72 hours.       Imaging-    Results from Hospital Encounter encounter on 08/18/18   XR CHEST SNGL V    Narrative INDICATION:  SOB       EXAMINATION:  XR CHEST SNGL V    COMPARISON:  02/14/2016    FINDINGS:  The study shows a normal sized heart.. The lungs are clear and well expanded.           Impression IMPRESSION:  Normal chest         Results from Hospital Encounter encounter on 08/06/18   CT ABD PELV WO CONT    Narrative CT Abdomen and Pelvis without    Indication: Abdominal pain. Nausea, vomiting, diarrhea.    Comparison: 07/22/2018.    TECHNIQUE:   CT of the abdomen and pelvis WITHOUT intravenous contrast. Coronal and sagittal  reformations were obtained. Oral contrast was given.    All CT exams at this facility use one or more dose reduction techniques  including automatic exposure control, mA/kV adjustment per patient's size, or  iterative reconstruction technique. DICOM format imaged data is available to  non-affiliated external healthcare facilities or entities on a secure, media  free, reciprocal searchable basis with patient authorization for 12 months  following the date of the study.    DISCUSSION:    ABSENCE OF INTRAVENOUS CONTRAST DECREASES SENSITIVITY FOR DETECTION OF FOCAL  LESIONS AND VASCULAR PATHOLOGY.    LOWER THORAX: Normal.    HEPATOBILIARY: No focal hepatic lesions.  No biliary ductal dilatation.  SPLEEN: No splenomegaly.  PANCREAS: No focal masses or ductal dilatation.    ADRENALS: No adrenal nodules.  KIDNEYS/URETERS: No hydronephrosis, stones, or solid mass lesions.  PELVIC ORGANS/BLADDER: Unremarkable.     PERITONEUM / RETROPERITONEUM: No free air or fluid.  LYMPH NODES: No lymphadenopathy.  VESSELS: Unremarkable.    GI TRACT: Multiple nondistended fluid-filled loops of small bowel with  questionable mild wall thickening. Fluid-filled colon with no significant wall  thickening. No obstruction. Normal appendix.    BONES AND SOFT TISSUES: No acute abnormality.      Impression IMPRESSION:    Numerous fluid-filled loops of small and large bowel with borderline diffuse  small bowel wall thickening, concerning for infectious or inflammatory  enterocolitis. Findings have slightly progressed since 07/22/2018.         ---------------------------------------------------------------------------------------------------------------  I have independently examined the patient and reviewed all lab studies and imgaing as well as review of nursing notes and physican notes       Jearld Adjutant, MD  08/19/2018    [facility]  [phone]

## 2018-08-19 NOTE — Progress Notes (Signed)
ADULT MALNUTRITION GUIDELINES    Patient is a 28 y.o. male, admitted on 08/18/2018 with a diagnosis of Intractable nausea and vomiting [V89.3]  Metabolic acidosis [Y10.1]  HIV (human immunodeficiency virus infection) ([place]) [B20]  Diarrhea [R19.7].  Nutrition assessment was completed by RD and the patient was found to meet the following malnutrition criteria established by ASPEN/AND:    Adult Malnutrition Guidelines:  SEVERE PROTEIN CALORIE MALNUTRITION IN THE CONTEXT OF CHRONIC ILLNESS  Weight loss: >20.5% x 2-3 month  Energy intake: <50% energy intake compared to estimated energy needs x >1 month   Other Considerations: Newly dx HIV    Please document Severe Protein Calorie Malnutrition in Problem List if in agreement    NUTRITION RECOMMENDATIONS:   ? Diet just advanced to soft solids.  ? Change ONS to Ensure Pudding (each provides 170 kcal, 4g protein) per pt request/preferance.  ? Rec MVI/min and thiamine supplementation.  ? Monitor lytes and replete prn.  ? Daily wts to trend.     NUTRITION INITIAL EVALUATION    NUTRITION ASSESSMENT:       Reason for assessment: MST    Admitting diagnosis:   Intractable nausea and vomiting [B51.0]  Metabolic acidosis [C58.5]  HIV (human immunodeficiency virus infection) ([place]) [B20]  Diarrhea [R19.7]     PMH:   Past Medical History:   Diagnosis Date   ??? Asthma    ??? Hypertension    ??? Neurological disorder     Bell's palsy       Current pertinent medications: LR @ 100 ml/hr, zofran prn    Pertinent labs: 6/23: K 3.3 (on LR), BUN/Cr 33/1.6, GFR 27/78 (metabolic acidosis)    Anthropometrics & Physical Assessment:   Height:   Ht Readings from Last 3 Encounters:   08/18/18 5\' 9"  (1.753 m)   08/06/18 5\' 7"  (1.702 m)   07/21/18 5\' 9"  (1.753 m)      Weight:   Wt Readings from Last 5 Encounters:   08/19/18 61.6 kg (135 lb 12.9 oz)   08/12/18 72.2 kg (159 lb 2.8 oz)   07/29/18 71 kg (156 lb 8.4 oz)   04/10/18 85.7 kg (189 lb)   02/14/16 87.5 kg (193 lb)            BMI: Body mass index is 20.05 kg/m??.    IBW: Ideal body weight: 70.7 kg (155 lb 13.8 oz)           UBW: 189 lb (04/10/18) per EMR; 165-170 lb per pt (April 2020)    Wt change: Using pt reported wt 170 lb: wt loss -35 lb (20.5%) x 2-3 months         [x]  significant       []  not significant         []  intended         [x]  not intended    GI symptoms/issues:       Reports constant diarrhea and vomiting  Last Bowel Movement Date: 08/18/18  Stool Appearance: Loose, Watery, Soft  Abdominal Assessment: Diarrhea, Nausea, Vomiting  Bowel Sounds: Active , Hyperactive     Chewing/swallowing issues:   None reported    Skin integrity:   intact       Muscle Wasting:  ? Temporal: mild  ? Clavicle: mild    Fat Wasting:  ? Suborbital: mild             Fluid accumulation:    none       Diet and  intake history:   ?? Current diet order:   DIET DENTAL SOFT (SOFT SOLID)  DIET NUTRITIONAL SUPPLEMENTS    ?? Food allergies: none    ?? Diet/intake history: Pt reports having an appetite but can't keep anything down d/t constant n/v/d. States being a slow eater. Per MD note, pt not eating any solid foods and drinking Gatorade lately           []  <50% intake x >5 days         [x]  <50% intake x >1 month         []  <75% intake x >7 days         []  <75% intake x 1 month         []  <75% intake x 3 months    ?? Current appetite/PO intake:   No data recorded   Monitor intake per EMR documentation once information becomes available     Assessment of Current MNT:   Currently no diet ordered which is inadequate to meet needs. Advance diet as tolerated to regular when medically able.     Estimated daily nutrition intake needs:  ?? 1860 - 2170 kcals (30-35 kcal/kg)  ?? 74 - 93 g protein (1.2-1.5 g/kg)  ?? 1550 - 2170 mL fluid (25-35 ml/kg)    NUTRITION DIAGNOSIS:     1. Severe Protein Calorie Malnutrition in context of chronic illness related to suboptimal protein-energy intake 2/2 constant n/v/d and newly  dx HIV as evidenced by weight loss of 35 lb (20.5%) x 2-3 months, </=50% of estimated energy requirements for >/=1 month.    NUTRITION INTERVENTION / RECOMMENDATIONS:     ? Diet just advanced to soft solids.  ? Change ONS to Ensure Pudding (each provides 170 kcal, 4g protein) per pt request/preferance.  ? Rec MVI/min and thiamine supplementation.  ? Monitor lytes and replete prn.  ? Daily wts to trend.     NUTRITION MONITORING AND EVALUATION:     Nutrition level of care:  []  low       []  moderate      [x]  high    Nutrition monitoring: MNT initiation and tolerance, wt, labs (BG, CMP), medical changes & treatment plan of care    Nutrition goals:  Pt to meet/tolerate >75% of estimated needs, maintain weight throughout LOS, BG control <180/nutrition-related labs WNL, maintain skin integrity.    Nilsa NuttingMegan Dockweiler, MS, RD  08/19/18  Pager # [phone]

## 2018-08-19 NOTE — H&P (Signed)
Admission History and Physical      Date of note:      August 19, 2018  Patient:               Travis [name], 28 y.o., male  Admit Date:        08/18/2018    Admission diagnosis: Intractable nausea and vomiting    History of present illness:  Travis [name] is a pleasant 28 year old African-American man who presented to the ER for nausea vomiting diarrhea    He was recently hospitalized from 6/11 to 6/17 for new diagnosis of HIV, diarrhea due to Giardia and Cryptosporidium.  His diarrhea improved somewhat and he was discharged on nitazoxanide.  He did follow-up with the [facility] on 6/19.  Blood work was done and follow-up appointment was scheduled.  Since discharge, he felt better for couple of days, but once again started having worsening nausea vomiting, associated with watery diarrhea multiple times a day.  He has been drinking water and Gatorade at home but has not had any oral solid intake.  He came to the ER for further evaluation last night.  He was found to have metabolic acidosis with elevated creatinine and dehydration.  He is being admitted for further treatment.  He denies fever.    Review of systems  12 Point ROS was done. Negative  except what is noted in the HPI.     Impression:     Hospital Problems  Date Reviewed: Jun 02, 2014          Codes Class Noted POA    Diarrhea ICD-10-CM: R19.7  ICD-9-CM: 787.91  08/19/2018 Unknown        Metabolic acidosis XBM-84-XL: E87.2  ICD-9-CM: 276.2  08/19/2018 Unknown        HIV (human immunodeficiency virus infection) ([place]) ICD-10-CM: B20  ICD-9-CM: V08  08/19/2018 Unknown        * (Principal) Intractable nausea and vomiting ICD-10-CM: R11.2  ICD-9-CM: 536.2  08/06/2018 Unknown              Assessment:     1. Nausea, vomiting, dehydration  2. 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 Acute kidney injury present on admission  4. Metabolic acidosis???could be due to severe diarrhea or renal tubular acidosis  5. Oral candidiasis   6. 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 Severe protein calorie malnutrition      Plan:     Admit to [facility]  Start [name] hydration.  Monitor renal function closely  For metabolic acidosis, will send urine sodium, urine chloride, urine potassium to check for renal tubular acidosis  Renal dose all medications  Patient has been taking nitazoxanide for few days without any improvement in his diarrhea.  Will start [name] Flagyl.    Soft solid diet.  Encourage oral intake  Start [name] fluconazole for oropharyngeal candidiasis  Lovenox for DVT prophylaxis  I consulted ID Dr. Glendora Score  Also discussed at length with patient's mom on phone      Dispo: Inpatient MedSurg    Code Status: Full code    Estimated Discharge Date: TBD      Past Medical History:   Diagnosis Date   ??? Asthma    ??? Hypertension    ??? Neurological disorder     Bell's palsy     History reviewed. No pertinent surgical history.  Social History     Socioeconomic History   ??? Marital status: SINGLE  Spouse name: Not on file   ??? Number of children: Not on file   ??? Years of education: Not on file   ??? Highest education level: Not on file   Occupational History   ??? Not on file   Social Needs   ??? Financial resource strain: Not on file   ??? Food insecurity     Worry: Not on file     Inability: Not on file   ??? Transportation needs     Medical: Not on file     Non-medical: Not on file   Tobacco Use   ??? Smoking status: Never Smoker   ??? Smokeless tobacco: Never Used   Substance and Sexual Activity   ??? Alcohol use: No   ??? Drug use: No   ??? Sexual activity: Yes     Partners: Female     Birth control/protection: Condom   Lifestyle   ??? Physical activity     Days per week: Not on file     Minutes per session: Not on file   ??? Stress: Not on file   Relationships   ??? Social Product manager on phone: Not on file     Gets together: Not on file     Attends religious service: Not on file     Active member of club or organization: Not on file      Attends meetings of clubs or organizations: Not on file     Relationship status: Not on file   ??? Intimate partner violence     Fear of current or ex partner: Not on file     Emotionally abused: Not on file     Physically abused: Not on file     Forced sexual activity: Not on file   Other Topics Concern   ??? Not on file   Social History Narrative   ??? Not on file     Social History     Tobacco Use   Smoking Status Never Smoker   Smokeless Tobacco Never Used     History reviewed. No pertinent family history.  No Known Allergies    Home Medications:     Prior to Admission medications    Medication Sig Start Date End Date Taking? Authorizing Provider   nitazoxanide (Alinia) 500 mg tablet Take 500 mg by mouth every twelve (12) hours.   Yes Provider, Historical   potassium chloride (K-DUR, KLOR-CON) 20 mEq tablet Take 1 Tab by mouth two (2) times a day. 08/12/18   Goodwin Kamphaus, Legrand Como, MD   dicyclomine (BENTYL) 10 mg capsule Take 1 Cap by mouth every six (6) hours as needed for Abdominal Cramps. 08/12/18   Sigmund Morera, Legrand Como, MD   ondansetron (ZOFRAN ODT) 4 mg disintegrating tablet Take 1 Tab by mouth every eight (8) hours as needed for Nausea or Vomiting. 08/12/18   Minetta Krisher S, MD   trimethoprim-sulfamethoxazole (Bactrim DS) 160-800 mg per tablet Take 1 Tab by mouth daily. Indications: Pneumocystis jiroveci pneumonia prevention 08/12/18   Karrie Fluellen, Legrand Como, MD   azithromycin (ZITHROMAX) 600 mg tablet Take 2 Tabs by mouth every seven (7) days. Indications: prevention of Mycobacterium avium complex disease 08/12/18   Jaimee Corum S, MD   albuterol (PROAIR HFA) 90 mcg/actuation inhaler Take 1 Puff by inhalation every four (4) hours as needed for Wheezing. 01/27/16   Ward, Shirley Friar, NP         Physical Assessment:     Visit Vitals  BP (!) 134/94 (BP Patient Position: Supine)   Pulse 83   Temp 97.8 ??F (36.6 ??C)   Resp 18   Ht _0  (1.753 m)   Wt 61.6 kg (135 lb 12.9 oz)   SpO2 100%   BMI 20.05 kg/m??      GEN - AAOx3, anxious, cachectic  HEENT - mucous membranes dry  Neck - supple, no JVD  Cardiac - RRR, S1, S2, no murmurs  Chest/Lungs - clear to auscultation without wheezes or rhonchi  Abdomen - soft, mild tenderness without guarding or rigidity  Extremities - no clubbing/ cyanosis/ edema  Neuro - nonfocal, intact; CN2-12 normal; motor - equal, symmetric strength; sensation - intact; cerebellar function intact finger to nose and heel to shin; patellar DTR 2/4  Skin - no rashes or lesions    Labs:   Recent Results (from the past 24 hour(s))   CBC WITH AUTOMATED DIFF    Collection Time: 08/18/18 10:00 PM   Result Value Ref Range    WBC 4.8 4.0 - 11.0 1000/mm3    RBC 5.84 (H) 3.80 - 5.70 M/uL    HGB 16.4 12.4 - 17.2 gm/dl    HCT 46.4 37.0 - 50.0 %    MCV 79.5 (L) 80.0 - 98.0 fL    MCH 28.1 23.0 - 34.6 pg    MCHC 35.3 30.0 - 36.0 gm/dl    PLATELET 240 140 - 450 1000/mm3    MPV 12.2 (H) 6.0 - 10.0 fL    RDW-SD 42.0 35.1 - 43.9      NRBC 0 0 - 0      IMMATURE GRANULOCYTES 0.2 0.0 - 3.0 %    NEUTROPHILS 63.0 34 - 64 %    LYMPHOCYTES 21.0 (L) 28 - 48 %    MONOCYTES 10.4 1 - 13 %    EOSINOPHILS 5.2 (H) 0 - 5 %    BASOPHILS 0.2 0 - 3 %   METABOLIC PANEL, COMPREHENSIVE    Collection Time: 08/18/18 10:00 PM   Result Value Ref Range    Sodium 138 136 - 145 mEq/L    Potassium 3.3 (L) 3.5 - 5.1 mEq/L    Chloride 112 (H) 98 - 107 mEq/L    CO2 13 (LL) 21 - 32 mEq/L    Glucose 115 (H) 74 - 106 mg/dl    BUN 33 (H) 7 - 25 mg/dl    Creatinine 1.6 (H) 0.6 - 1.3 mg/dl    GFR est AA >60      GFR est non-AA 54      Calcium 9.1 8.5 - 10.1 mg/dl    AST (SGOT) 37 15 - 37 U/L    ALT (SGPT) 48 12 - 78 U/L    Alk. phosphatase 89 45 - 117 U/L    Bilirubin, total 0.5 0.2 - 1.0 mg/dl    Protein, total 9.7 (H) 6.4 - 8.2 gm/dl    Albumin 3.5 3.4 - 5.0 gm/dl    Anion gap 13 5 - 15 mmol/L   LIPASE    Collection Time: 08/18/18 10:00 PM   Result Value Ref Range    Lipase 203 73 - 393 U/L   MAGNESIUM    Collection Time: 08/18/18 10:00 PM    Result Value Ref Range    Magnesium 2.0 1.6 - 2.6 mg/dl   LACTIC ACID    Collection Time: 08/18/18 10:00 PM   Result Value Ref Range    Lactic Acid 1.6 0.4 - 2.0 mmol/L  C. DIFFICILE/EPI PCR    Collection Time: 08/19/18 12:00 AM   Result Value Ref Range    C. diff toxin by PCR Toxigenic C. difficile NEGATIVE Toxigenic C. difficile NEGATIVE     URINALYSIS W/ RFLX MICROSCOPIC    Collection Time: 08/19/18 12:01 AM   Result Value Ref Range    Color BROWN (A) YELLOW,STRAW      Appearance HAZY (A) CLEAR      Glucose NEGATIVE NEGATIVE,Negative mg/dl    Bilirubin MODERATE (A) NEGATIVE,Negative      Ketone 15 (A) NEGATIVE,Negative mg/dl    Specific gravity 1.025 1.005 - 1.030      Blood NEGATIVE NEGATIVE,Negative      pH (UA) 6.5 5.0 - 9.0      Protein 30 (A) NEGATIVE,Negative mg/dl    Urobilinogen 0.2 0.0 - 1.0 mg/dl    Nitrites POSITIVE (A) NEGATIVE,Negative      Leukocyte Esterase NEGATIVE NEGATIVE,Negative     POC URINE MICROSCOPIC    Collection Time: 08/19/18 12:01 AM   Result Value Ref Range    Epithelial cells, squamous OCCASIONAL /LPF    WBC 1-4 /HPF    Bacteria OCCASIONAL /HPF    Hyaline cast OCCASIONAL /LPF    Coarse Granular casts OCCASIONAL /LPF   POC URINE MACROSCOPIC    Collection Time: 08/19/18 12:22 AM   Result Value Ref Range    Glucose Negative NEGATIVE,Negative mg/dl    Bilirubin Moderate (A) NEGATIVE,Negative      Ketone 15 (A) NEGATIVE,Negative mg/dl    Specific gravity >=1.030 1.005 - 1.030      Blood Negative NEGATIVE,Negative      pH (UA) 6.0 5 - 9      Protein 100 (A) NEGATIVE,Negative mg/dl    Urobilinogen 0.2 0.0 - 1.0 EU/dl    Nitrites Negative NEGATIVE,Negative      Leukocyte Esterase Negative NEGATIVE,Negative      Color Brown      Appearance Slightly Cloudy     GLUCOSE, POC    Collection Time: 08/19/18  2:49 AM   Result Value Ref Range    Glucose (POC) 80 65 - 105 mg/dL       Radiology:     XR Results:  Results from Hospital Encounter encounter on 08/18/18   XR CHEST SNGL V     Narrative INDICATION:  SOB       EXAMINATION:  XR CHEST SNGL V    COMPARISON:  02/14/2016    FINDINGS:  The study shows a normal sized heart.. The lungs are clear and well expanded.           Impression IMPRESSION:  Normal chest         CT Results:  Results from Hospital Encounter encounter on 08/06/18   CT ABD PELV WO CONT    Narrative CT Abdomen and Pelvis without    Indication: Abdominal pain. Nausea, vomiting, diarrhea.    Comparison: 07/22/2018.    TECHNIQUE:   CT of the abdomen and pelvis WITHOUT intravenous contrast. Coronal and sagittal  reformations were obtained. Oral contrast was given.    All CT exams at this facility use one or more dose reduction techniques  including automatic exposure control, mA/kV adjustment per patient's size, or  iterative reconstruction technique. DICOM format imaged data is available to  non-affiliated external healthcare facilities or entities on a secure, media  free, reciprocal searchable basis with patient authorization for 12 months  following the date of the study.    DISCUSSION:  ABSENCE OF INTRAVENOUS CONTRAST DECREASES SENSITIVITY FOR DETECTION OF FOCAL  LESIONS AND VASCULAR PATHOLOGY.    LOWER THORAX: Normal.    HEPATOBILIARY: No focal hepatic lesions.  No biliary ductal dilatation.  SPLEEN: No splenomegaly.  PANCREAS: No focal masses or ductal dilatation.    ADRENALS: No adrenal nodules.  KIDNEYS/URETERS: No hydronephrosis, stones, or solid mass lesions.  PELVIC ORGANS/BLADDER: Unremarkable.    PERITONEUM / RETROPERITONEUM: No free air or fluid.  LYMPH NODES: No lymphadenopathy.  VESSELS: Unremarkable.    GI TRACT: Multiple nondistended fluid-filled loops of small bowel with  questionable mild wall thickening. Fluid-filled colon with no significant wall  thickening. No obstruction. Normal appendix.    BONES AND SOFT TISSUES: No acute abnormality.      Impression IMPRESSION:    Numerous fluid-filled loops of small and large bowel with borderline diffuse   small bowel wall thickening, concerning for infectious or inflammatory  enterocolitis. Findings have slightly progressed since 07/22/2018.         MRI Results:  No results found for this or any previous visit.    Nuclear Medicine Results:  No results found for this or any previous visit.    [place] Results:  Results from [place] encounter on 07/21/18   [place] RUQ    Narrative Clinical history: Elevated liver function studies    EXAMINATION:  Upper quadrant ultrasound 07/28/2018    FINDINGS:  Liver demonstrates homogeneous echotexture. No intrahepatic bile duct  dilatation. Bile duct measures 4 mm. No stones are seen in the gallbladder.  Gallbladder wall measures 2 mm. Sonographic Percell Miller sign is negative.    Pancreas, proximal aorta and IVC are within normal limits. Right kidney measures  10.4 x 5.6 x 3.7 cm. Normal corticomedullary differentiation. No hydronephrosis.  Small amounts of perihepatic ascites.      Impression IMPRESSION:  Small amounts of perihepatic ascites.         IR Results (maximum last 3):  No results found for this or any previous visit.    VAS/US Results (maximum last 3):  No results found for this or any previous visit.      Total clinical care time was 76mnutes of which more than 50% was spent in coordination of care and counseling (time spent with patient/family face to face, physical exam, reviewing laboratory and imaging investigations, speaking with physicians and nursing staff involved in this patient's care).       RPerfecto Kingdom MD  [facility]  August 19, 2018, 9:18 AM

## 2018-08-19 NOTE — Progress Notes (Signed)
Problem: Pain  Goal: *Control of Pain  Outcome: Progressing Towards Goal     Problem: Falls - Risk of  Goal: *Absence of Falls  Description: Document Schmid Fall Risk and appropriate interventions in the flowsheet.  Outcome: Progressing Towards Goal  Note: Fall Risk Interventions:  Mobility Interventions: Assess mobility with egress test, Bed/chair exit alarm, Communicate number of staff needed for ambulation/transfer, Patient to call before getting OOB         Medication Interventions: Assess postural VS orthostatic hypotension, Bed/chair exit alarm, Patient to call before getting OOB, Teach patient to arise slowly    Elimination Interventions: Bed/chair exit alarm, Call light in reach    History of Falls Interventions: Bed/chair exit alarm, Vital signs minimum Q4HRs X 24 hrs (comment for end date)

## 2018-08-19 NOTE — Other (Signed)
Patient has had 1 large watery bm today.

## 2018-08-19 NOTE — Progress Notes (Signed)
D/C Plan:  Home with home health (would like for patient to get PCA services through Medicaid)  No DME anticipated.  Family will transport him home    Anticipated date of D/C -  08/23/2018    Case Management Assessment  Patient lives with his mother in a 2 story home.  Patient has his own bedroom and bathroom.  Patient does not drive.  His mother takes him to all of his medical appointments/RX.  Patient's health has deteriorated over the last couple month.  Patient is not able to care for himself in the home.  The mother is trying to assist with his care but is unable to meet the demands.  SW spoke with patient's mother about the possibility of getting a PCA in the home to assist.   SW will request a [facility] for PCA in the home    Specialists:     Dialysis Unit:      Pharmacy: [facility]   When patient was asked: Do you have any difficulty paying for or obtaining medications? The patient replied: Mother completes paperwork    DME: None  Consults:    Therapy Recommendations:    OT:     PT:      SLP:        Oncology Navigator Referral:  [place] [place] (formerly [facility]) Consulted: N    Wound Care:  N    When patient was asked: Does the patient have appropriate clothing available to be worn at discharge? The patient replied  yes    Care Management Interventions  Readmission Interview Completed: Yes  PCP Verified by CM: Yes([name])  Palliative Care Criteria Met (RRAT>21 & CHF Dx)?: No  Mode of Transport at Discharge: Other (see comment)  Transition of Care Consult (CM Consult): Discharge Planning, [place]: Yes  MyChart Signup: Yes  Discharge Durable Medical Equipment: No  Physical Therapy Consult: Yes  Occupational Therapy Consult: Yes  Speech Therapy Consult: No  Current Support Network: Relative's Home(lives with family (mom))  Reason for Referral: DCP Rounds  History Provided By: Child/Family(Mother)  Support System Response: Cooperative, Concerned   Previous Living Arrangement: Lives with Family Dependent  Home Accessibility: Steps, Multi Level Home(1 step 12 steps to second floor)  Prior Functional Level: Assistance with the following:, Mobility, Shopping, Housework, Other (see comment), Cooking  Current Functional Level: Assistance with the following:, Mobility, Shopping, Other (see comment), Cooking, Housework  Can patient return to prior living arrangement: Yes  Ability to make needs known:: Good  Family able to assist with home care needs:: Yes  Would you like for me to discuss the discharge plan with any other family members/significant others, and if so, who?: Yes(Ms. Rolena Infante [phone])  Pets: None  Problems related to education and literacy?: No  Problems related to employment and unemployment?: No  Occupational exposure to risk factors?: No  Problems related to housing and economic circumstances?: No  Problems related to social environment?: No  Problems related to upbringing?: No  Problems related to primary support group, including family circumstances?: No  Problems related to psychosocial circumstances?: No  Education officer, museum Referral: [facility]/96(would like to look at PCA services in the home)  Types of Needs Identified: Disease Management Education, ADLs/IADLs  Anticipated Discharge Needs: Home Health Services  Confirm Follow Up Transport: Self  Confirm Transport and Arrange: No  The Plan for Transition of Care is Related to the Following Treatment Goals : Get better, doctors sit  down with me face to face and tell me everything that is going on.  The Patient and/or Patient Representative was Provided with a Choice of Provider and Agrees with the Discharge Plan?: Yes  Freedom of Choice List was Provided with Basic Dialogue that Supports the Patient's Individualized Plan of Care/Goals, Treatment Preferences and Shares the Quality Data Associated with the Providers?: Yes  Veteran Resource Information Provided?: No  Discharge Location   Discharge Placement: Home with home health

## 2018-08-19 NOTE — Other (Signed)
Bedside and verbal shift change report given to [employer], Charity fundraiser (Cabin crew) by Yevonne Aline, RN (offgoing nurse). Report included the following information SBAR, Kardex, and MAR.

## 2018-08-19 NOTE — ED Notes (Signed)
Urine sent to the lab.

## 2018-08-19 NOTE — Consults (Signed)
Consults by Jearld Adjutant, MD at 08/19/18 [phone]                Author: Jearld Adjutant, MD  Service: Infectious Disease  Author Type: Physician       Filed: 08/19/18 1531  Date of Service: 08/19/18 0947  Status: Signed          Editor: Jearld Adjutant, MD (Physician)                                                                                       INFECTIOUS DISEASE CONSULT NOTE          Requested by: Dr Ninetta Lights      Reason for consult: N/V/D in immunocopromised host      Date of admission: 08/18/2018      Date of consult: August 19, 2018           ABX:            Current abx  Prior abx      Albendazole 400 mg bid  6/24   X 7 d   Flagyl 250 mg po bid x 7 d      Fluconazole 6/24   nitazoxinide 6/13    11d           ASSESSMENT:             N/V/D/ dehydration     Uncontrolled parasitic infection of GI ( see below) likely-      Giardia should have responded but 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 in HIV- reconstitution of immune system with ART will help immensely      Cannot start ART till genomic testing reviewed      Will initiate albendazol/flagyl  For rx of recalcitrant giardia      Oral candidiasis and suspect esophagitis         cryptosporidia/ Giardia concomitant infection    source unknown      D 11 of nitazoxinide          HIV- new diagnosis     VL > 1 mil     cd4  17 abs/ 2%     hiv genomic testing for resistance sent 6/15- still IP      Once available, start ART      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      neutropenia with adequate anc                        nkda                   RECOMMENDATIONS:         Begin albendazol 400 mg po bid plus flagyl 250 mg IV bid x 7 d   Rehydration per IM   Agree with fluconazole   Hold azithro for now and change pcp prophylaxis to bactrim DS MWF for now   Consider repeat CT abd/pelvis with oral contrast- eval for adenopathy and bowel integrity                  HIV testing + HIV An, pcr,  cd4  2%; 17 absolute    , genomic testing for resistance ordered 6/15-  IP                                        MICROBIOLOGY:          PTA   5/27  Neg c diff   6/2    Stool culture neg   6/12  + for BOTH giardia and cryptosporidia-- lab repeated test with same results      Currrent   6/24  Stool neg CDI            Stool cullture IP              HPI:        28 y.o. 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  Was  improving with less frequent bm till last sat- then N/V/and increased diarrhea.  He vomited up his nitazoxinide on sat but was able to keep  it down therafter.  Noticed that his emesis turned red last night, he is unsure if this is blood or not, and then again turned red at this evening and then opted to check and to the hospital as the vomiting is profuse, he feels overall weak, dehydrated,  and was so weak that he fell to the ground earlier today.  He has diffuse abdominal pain, but has had no fever.  He is noted no bloody stools.  Currently States that he has persistent profuse diarrhea, to many times to count during the day.   He went to [facility]  last Friday and was interviewed by case worker and blood was drawn.  He did not see md  Nor was he prescribed ART.   ??     Past medical history:          Past Medical History:        Diagnosis  Date         ?  Asthma       ?  Hypertension       ?  Neurological disorder            Bell's palsy           History reviewed. No pertinent surgical history.         Social History:          Social History          Socioeconomic History         ?  Marital status:  SINGLE              Spouse name:  Not on file         ?  Number of children:  Not on file     ?  Years of education:  Not on file     ?  Highest education level:  Not on file       Occupational History        ?  Not on file       Social Needs         ?  Financial resource strain:  Not on file        ?  Food insecurity              Worry:  Not  on file          Inability:  Not on file        ?  Transportation needs              Medical:  Not on file         Non-medical:  Not on file       Tobacco Use         ?  Smoking status:  Never Smoker     ?  Smokeless tobacco:  Never Used       Substance and Sexual Activity         ?  Alcohol use:  No     ?  Drug use:  No     ?  Sexual activity:  Yes              Partners:  Female         Birth control/protection:  Condom       Lifestyle        ?  Physical activity              Days per week:  Not on file         Minutes per session:  Not on file         ?  Stress:  Not on file       Relationships        ?  Social Health visitor on phone:  Not on file         Gets together:  Not on file         Attends religious service:  Not on file         Active member of club or organization:  Not on file         Attends meetings of clubs or organizations:  Not on file         Relationship status:  Not on file        ?  Intimate partner violence              Fear of current or ex partner:  Not on file              Emotionally abused:  Not on file         Physically abused:  Not on file         Forced sexual activity:  Not on file        Other Topics  Concern        ?  Not on file       Social History Narrative        ?  Not on file             Family History:     History reviewed. No pertinent family history.        Allergies:     No Known Allergies           Home Medications:          Medications Prior to Admission        Medication  Sig         ?  nitazoxanide (Alinia) 500 mg tablet  Take 500 mg by mouth every twelve (12) hours.     ?  potassium chloride (K-DUR, KLOR-CON) 20 mEq tablet  Take 1 Tab by mouth two (2) times a day.     ?  dicyclomine (BENTYL) 10 mg capsule  Take 1 Cap by mouth every six (6) hours as needed for Abdominal Cramps.     ?  ondansetron (ZOFRAN ODT) 4 mg disintegrating tablet  Take 1 Tab by mouth every eight (8) hours as needed for Nausea or Vomiting.     ?  trimethoprim-sulfamethoxazole (Bactrim DS)  160-800 mg per tablet  Take 1 Tab by mouth daily. Indications: Pneumocystis jiroveci pneumonia prevention     ?  azithromycin (ZITHROMAX) 600 mg tablet  Take 2 Tabs by mouth every seven (7) days. Indications: prevention of Mycobacterium avium complex disease         ?  albuterol (PROAIR HFA) 90 mcg/actuation inhaler  Take 1 Puff by inhalation every four (4) hours as needed for Wheezing.             Current Medications:          Current Facility-Administered Medications          Medication  Dose  Route  Frequency           ?  lactated Ringers infusion   100 mL/hr  IntraVENous  CONTINUOUS     ?  ondansetron (ZOFRAN) injection 4 mg   4 mg  IntraVENous  Q6H PRN     ?  metroNIDAZOLE (FLAGYL) IVPB premix 500 mg   500 mg  IntraVENous  Q8H     ?  acetaminophen (TYLENOL) tablet 650 mg   650 mg  Oral  Q6H PRN           ?  nystatin (MYCOSTATIN) 100,000 unit/mL oral suspension 500,000 Units   500,000 Units  Oral  QID             Review of Systems:     12 points ROS done. Pertinent positives and negatives are as follows, ROS otherwise negative.      Constitutional: Negative for fever, chills, diaphoresis. No unexpected weight change.    HENT: Negative for ear pain, congestion, sore throat, rhinorrhea.    Eyes: Negative for pain, redness and visual disturbance.    Respiratory: negative for shortness of breath, cough, chest tightness, wheezing.    Cardiovascular: Negative for chest pain, palpitations and leg swelling.    Gastrointestinal: seehpi   Genitourinary: Negative for dysuria    Musculoskeletal: Negative for back pain, joint pain or muscle aches    Skin: Negative for ulcers, rash   Neurological: Negative for dizziness, syncope, light-headedness or headaches.    Hematological:Negative for easy bruising or bleeding.    Psychiatric/Behavioral: Negative anxiety, depression.         Physical Exam:   Vitals   Temp (24hrs), Avg:98 ??F (36.7 ??C), Min:97.7 ??F (36.5 ??C), Max:98.4 ??F (36.9 ??C)      Visit Vitals      BP  (!) 134/94 (BP  Patient Position: Supine)     Pulse  83     Temp  97.8 ??F (36.6 ??C)     Resp  18     Ht  _0  (1.753 m)     Wt  61.6 kg (135 lb 12.9 oz)     SpO2  100%        BMI  20.05 kg/m??           General: Well-developed, 28 y.o. year-old, male , in no acute distress   HEENT: Normocephalic, anicteric sclerae, Pupils equal, round reactive to light, no oropharyngeal lesions. No sinus tenderness.   Neck: Supple, no lymphadenopathy, masses  or thyromegaly   Chest: Symmetrical expansion   Lungs: Clear to auscultation bilaterally, no dullness   Heart: Regular rhythm, no murmur, no rub or gallop, No JVD   Abdomen: Soft, TTP, diffusely. No rebound.non distended, no organomegaly, BS+. Neg guiac of emesis in ED   Musculoskeletal: Normal strength/tone. No edema. No clubbing or cyanosis   CNS: AAOx3. Cranial nerves II-XII intact. Grossly normal.No NR   SKIN: No skin lesion or rash. Dry, warm, intact                 Labs:  Results:        Chemistry  Recent Labs         08/18/18   2200      GLU  115*      NA  138      K  3.3*      CL  112*      CO2  13*      BUN  33*      CREA  1.6*      CA  9.1      AGAP  13      AP  89      TP  9.7*      ALB  3.5              CBC w/Diff  Recent Labs         08/18/18   2200      WBC  4.8      RBC  5.84*      HGB  16.4      HCT  46.4      PLT  240      GRANS  63.0      LYMPH  21.0*      EOS  5.2*              Microbiology  No results for input(s): CULT in the last 72 hours.           Imaging-        Results from Hospital Encounter encounter on 08/18/18     XR CHEST SNGL V           Narrative  INDICATION:   SOB         EXAMINATION:   XR CHEST SNGL V      COMPARISON:   02/14/2016      FINDINGS:   The study shows a normal sized heart.. The lungs are clear and well expanded.                    Impression  IMPRESSION:   Normal chest                Results from Hospital Encounter encounter on 08/06/18     CT ABD PELV WO CONT           Narrative  CT Abdomen and Pelvis without      Indication: Abdominal pain.  Nausea, vomiting, diarrhea.      Comparison: 07/22/2018.      TECHNIQUE:    CT of the abdomen and pelvis WITHOUT intravenous contrast. Coronal and sagittal   reformations were obtained. Oral contrast was given.      All CT exams at this facility use one or more dose reduction techniques   including automatic exposure control, mA/kV adjustment per patient's size, or   iterative reconstruction technique. DICOM format imaged data is available to   non-affiliated  external healthcare facilities or entities on a secure, media   free, reciprocal searchable basis with patient authorization for 12 months   following the date of the study.      DISCUSSION:      ABSENCE OF INTRAVENOUS CONTRAST DECREASES SENSITIVITY FOR DETECTION OF FOCAL   LESIONS AND VASCULAR PATHOLOGY.      LOWER THORAX: Normal.      HEPATOBILIARY: No focal hepatic lesions.  No biliary ductal dilatation.   SPLEEN: No splenomegaly.   PANCREAS: No focal masses or ductal dilatation.      ADRENALS: No adrenal nodules.   KIDNEYS/URETERS: No hydronephrosis, stones, or solid mass lesions.   PELVIC ORGANS/BLADDER: Unremarkable.      PERITONEUM / RETROPERITONEUM: No free air or fluid.   LYMPH NODES: No lymphadenopathy.   VESSELS: Unremarkable.      GI TRACT: Multiple nondistended fluid-filled loops of small bowel with   questionable mild wall thickening. Fluid-filled colon with no significant wall   thickening. No obstruction. Normal appendix.      BONES AND SOFT TISSUES: No acute abnormality.              Impression  IMPRESSION:      Numerous fluid-filled loops of small and large bowel with borderline diffuse   small bowel wall thickening, concerning for infectious or inflammatory   enterocolitis. Findings have slightly progressed since 07/22/2018.              ---------------------------------------------------------------------------------------------------------------   I have independently examined the patient and reviewed all lab studies and imgaing as well as  review of nursing notes and physican notes          Jearld Adjutant, MD   08/19/2018      [facility]   [phone]

## 2018-08-19 NOTE — Progress Notes (Signed)
Problem: Pain  Goal: *Control of Pain  Outcome: Progressing Towards Goal

## 2018-08-19 NOTE — H&P (Signed)
H&P by Everlene Farrier, MD at 08/19/18 4580                Author: Everlene Farrier, MD  Service: Hospitalist  Author Type: Physician       Filed: 08/19/18 1000  Date of Service: 08/19/18 9983  Status: Signed          Editor: Everlene Farrier, MD (Physician)                          Admission History and Physical         Date of note:      August 19, 2018   Patient:               Travis [name] , 28 y.o., male   Admit Date:        08/18/2018      Admission diagnosis:  Intractable nausea and vomiting      History of present illness:  Mr. Chayanne Speir [name] is a pleasant 28 year old  African-American man who presented to the ER for nausea vomiting diarrhea      He was recently hospitalized from 6/11 to 6/17 for new diagnosis of HIV, diarrhea due to Giardia and Cryptosporidium.  His diarrhea improved somewhat and he was discharged on nitazoxanide.  He did  follow-up with the [facility] on 6/19.  Blood work was done and follow-up appointment was scheduled.   Since discharge, he felt better for couple of days, but once again started having worsening nausea vomiting, associated with watery diarrhea multiple times a day.  He has been drinking water and Gatorade at home but has not had any oral solid intake.   He came to the ER for further evaluation last night.  He was found to have metabolic acidosis with elevated creatinine and dehydration.  He is being admitted for further treatment.  He denies fever.      Review of systems   12 Point ROS was done. Negative  except what is noted in the HPI.         Impression:           Hospital Problems   Date Reviewed:  05/03/2014                         Codes  Class  Noted  POA              Diarrhea  ICD-10-CM: R19.7   ICD-9-CM: 787.91    08/19/2018  Unknown                        Metabolic acidosis  JAS-50-NL: E87.2   ICD-9-CM: 276.2    08/19/2018  Unknown                        HIV (human immunodeficiency virus infection) ([place])  ICD-10-CM: B20   ICD-9-CM: V08    08/19/2018   Unknown                        * (Principal) Intractable nausea and vomiting  ICD-10-CM: R11.2   ICD-9-CM: 536.2    08/06/2018  Unknown                            Assessment:  1.  Nausea, vomiting, dehydration   2.  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  Acute kidney injury present on admission   4.  Metabolic acidosis-could be due to severe diarrhea or renal tubular acidosis   5.  Oral candidiasis   6.  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  Severe protein calorie malnutrition           Plan:        Admit to [facility]   Start [name] hydration.  Monitor renal function closely   For metabolic acidosis, will send urine sodium, urine chloride, urine potassium to check for renal tubular acidosis   Renal dose all medications   Patient has been taking nitazoxanide for few days without any improvement in his diarrhea.  Will start [name] Flagyl.     Soft solid diet.  Encourage oral intake   Start [name] fluconazole for oropharyngeal candidiasis   Lovenox for DVT prophylaxis   I consulted ID Dr. Glendora Score   Also discussed at length with patient's mom on phone         Dispo: Inpatient MedSurg      Code Status: Full code      Estimated Discharge Date: TBD           Past Medical History:        Diagnosis  Date         ?  Asthma       ?  Hypertension       ?  Neurological disorder            Bell's palsy        History reviewed. No pertinent surgical history.     Social History          Socioeconomic History         ?  Marital status:  SINGLE              Spouse name:  Not on file         ?  Number of children:  Not on file     ?  Years of education:  Not on file     ?  Highest education level:  Not on file       Occupational History        ?  Not on file       Social Needs         ?  Financial resource strain:  Not on file        ?  Food insecurity              Worry:  Not on file         Inability:  Not on file        ?  Transportation needs              Medical:  Not on file         Non-medical:  Not on file        Tobacco Use         ?  Smoking status:  Never Smoker     ?  Smokeless tobacco:  Never Used       Substance and Sexual Activity         ?  Alcohol use:  No     ?  Drug use:  No     ?  Sexual activity:  Yes  Partners:  Female         Birth control/protection:  Condom       Lifestyle        ?  Physical activity              Days per week:  Not on file         Minutes per session:  Not on file         ?  Stress:  Not on file       Relationships        ?  Social Health visitor on phone:  Not on file         Gets together:  Not on file         Attends religious service:  Not on file         Active member of club or organization:  Not on file         Attends meetings of clubs or organizations:  Not on file         Relationship status:  Not on file        ?  Intimate partner violence              Fear of current or ex partner:  Not on file         Emotionally abused:  Not on file         Physically abused:  Not on file         Forced sexual activity:  Not on file        Other Topics  Concern        ?  Not on file       Social History Narrative        ?  Not on file          Social History          Tobacco Use        Smoking Status  Never Smoker        Smokeless Tobacco  Never Used        History reviewed. No pertinent family history.   No Known Allergies        Home Medications:          Prior to Admission medications             Medication  Sig  Start Date  End Date  Taking?  Authorizing Provider            nitazoxanide (Alinia) 500 mg tablet  Take 500 mg by mouth every twelve (12) hours.      Yes  Provider, Historical     potassium chloride (K-DUR, KLOR-CON) 20 mEq tablet  Take 1 Tab by mouth two (2) times a day.  08/12/18      Kajuan Guyton, Legrand Como, MD     dicyclomine (BENTYL) 10 mg capsule  Take 1 Cap by mouth every six (6) hours as needed for Abdominal Cramps.  08/12/18      Lindalee Huizinga, Legrand Como, MD     ondansetron (ZOFRAN ODT) 4 mg disintegrating tablet  Take 1 Tab by mouth every eight (8) hours as  needed for Nausea or Vomiting.  08/12/18      Antion Andres S, MD     trimethoprim-sulfamethoxazole (Bactrim DS) 160-800 mg per tablet  Take 1 Tab by mouth daily. Indications: Pneumocystis jiroveci pneumonia prevention  08/12/18      Zamar Odwyer, Legrand Como, MD     azithromycin (ZITHROMAX) 600 mg tablet  Take 2 Tabs by mouth every seven (7) days. Indications: prevention of Mycobacterium avium complex disease  08/12/18      Travis Mastel S, MD            albuterol (PROAIR HFA) 90 mcg/actuation inhaler  Take 1 Puff by inhalation every four (4) hours as needed for Wheezing.  01/27/16      Ward, Shirley Friar, NP                Physical Assessment:        Visit Vitals      BP  (!) 134/94 (BP Patient Position: Supine)     Pulse  83     Temp  97.8 ??F (36.6 ??C)     Resp  18     Ht  _0  (1.753 m)     Wt  61.6 kg (135 lb 12.9 oz)     SpO2  100%        BMI  20.05 kg/m??        GEN - AAOx3, anxious, cachectic   HEENT - mucous membranes dry   Neck - supple, no JVD   Cardiac - RRR, S1, S2, no murmurs   Chest/Lungs - clear to auscultation without wheezes or rhonchi   Abdomen - soft, mild tenderness without guarding or rigidity   Extremities - no clubbing/ cyanosis/ edema   Neuro - nonfocal, intact; CN2-12 normal; motor - equal, symmetric strength; sensation - intact; cerebellar function intact finger to nose and heel to shin; patellar DTR 2/4   Skin - no rashes or lesions      Labs:      Recent Results (from the past 24 hour(s))     CBC WITH AUTOMATED DIFF          Collection Time: 08/18/18 10:00 PM         Result  Value  Ref Range            WBC  4.8  4.0 - 11.0 1000/mm3       RBC  5.84 (H)  3.80 - 5.70 M/uL       HGB  16.4  12.4 - 17.2 gm/dl       HCT  46.4  37.0 - 50.0 %       MCV  79.5 (L)  80.0 - 98.0 fL            MCH  28.1  23.0 - 34.6 pg            MCHC  35.3  30.0 - 36.0 gm/dl       PLATELET  240  140 - 450 1000/mm3       MPV  12.2 (H)  6.0 - 10.0 fL       RDW-SD  42.0  35.1 - 43.9         NRBC  0  0 - 0         IMMATURE  GRANULOCYTES  0.2  0.0 - 3.0 %       NEUTROPHILS  63.0  34 - 64 %       LYMPHOCYTES  21.0 (L)  28 - 48 %       MONOCYTES  10.4  1 - 13 %       EOSINOPHILS  5.2 (H)  0 - 5 %  BASOPHILS  0.2  0 - 3 %       METABOLIC PANEL, COMPREHENSIVE          Collection Time: 08/18/18 10:00 PM         Result  Value  Ref Range            Sodium  138  136 - 145 mEq/L       Potassium  3.3 (L)  3.5 - 5.1 mEq/L       Chloride  112 (H)  98 - 107 mEq/L       CO2  13 (LL)  21 - 32 mEq/L       Glucose  115 (H)  74 - 106 mg/dl       BUN  33 (H)  7 - 25 mg/dl       Creatinine  1.6 (H)  0.6 - 1.3 mg/dl       GFR est AA  >60          GFR est non-AA  54          Calcium  9.1  8.5 - 10.1 mg/dl       AST (SGOT)  37  15 - 37 U/L       ALT (SGPT)  48  12 - 78 U/L       Alk. phosphatase  89  45 - 117 U/L       Bilirubin, total  0.5  0.2 - 1.0 mg/dl       Protein, total  9.7 (H)  6.4 - 8.2 gm/dl       Albumin  3.5  3.4 - 5.0 gm/dl       Anion gap  13  5 - 15 mmol/L       LIPASE          Collection Time: 08/18/18 10:00 PM         Result  Value  Ref Range            Lipase  203  73 - 393 U/L       MAGNESIUM          Collection Time: 08/18/18 10:00 PM         Result  Value  Ref Range            Magnesium  2.0  1.6 - 2.6 mg/dl       LACTIC ACID          Collection Time: 08/18/18 10:00 PM         Result  Value  Ref Range            Lactic Acid  1.6  0.4 - 2.0 mmol/L       C. DIFFICILE/EPI PCR          Collection Time: 08/19/18 12:00 AM         Result  Value  Ref Range            C. diff toxin by PCR  Toxigenic C. difficile NEGATIVE  Toxigenic C. difficile NEGATIVE         URINALYSIS W/ RFLX MICROSCOPIC          Collection Time: 08/19/18 12:01 AM         Result  Value  Ref Range            Color  BROWN (A)  YELLOW,STRAW         Appearance  HAZY (A)  CLEAR  Glucose  NEGATIVE  NEGATIVE,Negative mg/dl       Bilirubin  MODERATE (A)  NEGATIVE,Negative         Ketone  15 (A)  NEGATIVE,Negative mg/dl       Specific gravity  1.025  1.005 - 1.030          Blood  NEGATIVE  NEGATIVE,Negative         pH (UA)  6.5  5.0 - 9.0         Protein  30 (A)  NEGATIVE,Negative mg/dl       Urobilinogen  0.2  0.0 - 1.0 mg/dl       Nitrites  POSITIVE (A)  NEGATIVE,Negative         Leukocyte Esterase  NEGATIVE  NEGATIVE,Negative         POC URINE MICROSCOPIC          Collection Time: 08/19/18 12:01 AM         Result  Value  Ref Range            Epithelial cells, squamous  OCCASIONAL  /LPF       WBC  1-4  /HPF       Bacteria  OCCASIONAL  /HPF       Hyaline cast  OCCASIONAL  /LPF       Coarse Granular casts  OCCASIONAL  /LPF       POC URINE MACROSCOPIC          Collection Time: 08/19/18 12:22 AM         Result  Value  Ref Range            Glucose  Negative  NEGATIVE,Negative mg/dl       Bilirubin  Moderate (A)  NEGATIVE,Negative         Ketone  15 (A)  NEGATIVE,Negative mg/dl       Specific gravity  >=1.030  1.005 - 1.030         Blood  Negative  NEGATIVE,Negative         pH (UA)  6.0  5 - 9         Protein  100 (A)  NEGATIVE,Negative mg/dl       Urobilinogen  0.2  0.0 - 1.0 EU/dl       Nitrites  Negative  NEGATIVE,Negative         Leukocyte Esterase  Negative  NEGATIVE,Negative         Color  Brown          Appearance  Slightly Cloudy          GLUCOSE, POC          Collection Time: 08/19/18  2:49 AM         Result  Value  Ref Range            Glucose (POC)  80  65 - 105 mg/dL           Radiology:       XR Results:     Results from Hospital Encounter encounter on 08/18/18     XR CHEST SNGL V           Narrative  INDICATION:   SOB         EXAMINATION:   XR CHEST SNGL V      COMPARISON:   02/14/2016      FINDINGS:   The study shows a normal sized heart.. The lungs are clear and well expanded.  Impression  IMPRESSION:   Normal chest              CT Results:     Results from Hospital Encounter encounter on 08/06/18     CT ABD PELV WO CONT           Narrative  CT Abdomen and Pelvis without      Indication: Abdominal pain. Nausea, vomiting, diarrhea.      Comparison:  07/22/2018.      TECHNIQUE:    CT of the abdomen and pelvis WITHOUT intravenous contrast. Coronal and sagittal   reformations were obtained. Oral contrast was given.      All CT exams at this facility use one or more dose reduction techniques   including automatic exposure control, mA/kV adjustment per patient's size, or   iterative reconstruction technique. DICOM format imaged data is available to   non-affiliated external healthcare facilities or entities on a secure, media   free, reciprocal searchable basis with patient authorization for 12 months   following the date of the study.      DISCUSSION:      ABSENCE OF INTRAVENOUS CONTRAST DECREASES SENSITIVITY FOR DETECTION OF FOCAL   LESIONS AND VASCULAR PATHOLOGY.      LOWER THORAX: Normal.      HEPATOBILIARY: No focal hepatic lesions.  No biliary ductal dilatation.   SPLEEN: No splenomegaly.   PANCREAS: No focal masses or ductal dilatation.      ADRENALS: No adrenal nodules.   KIDNEYS/URETERS: No hydronephrosis, stones, or solid mass lesions.   PELVIC ORGANS/BLADDER: Unremarkable.      PERITONEUM / RETROPERITONEUM: No free air or fluid.   LYMPH NODES: No lymphadenopathy.   VESSELS: Unremarkable.      GI TRACT: Multiple nondistended fluid-filled loops of small bowel with   questionable mild wall thickening. Fluid-filled colon with no significant wall   thickening. No obstruction. Normal appendix.      BONES AND SOFT TISSUES: No acute abnormality.              Impression  IMPRESSION:      Numerous fluid-filled loops of small and large bowel with borderline diffuse   small bowel wall thickening, concerning for infectious or inflammatory   enterocolitis. Findings have slightly progressed since 07/22/2018.              MRI Results:   No results found for this or any previous visit.      Nuclear Medicine Results:   No results found for this or any previous visit.      [place] Results:     Results from [facility] encounter on 07/21/18     [place] RUQ           Narrative   Clinical history: Elevated liver function studies      EXAMINATION:   Upper quadrant ultrasound 07/28/2018      FINDINGS:   Liver demonstrates homogeneous echotexture. No intrahepatic bile duct   dilatation. Bile duct measures 4 mm. No stones are seen in the gallbladder.   Gallbladder wall measures 2 mm. Sonographic Percell Miller sign is negative.      Pancreas, proximal aorta and IVC are within normal limits. Right kidney measures   10.4 x 5.6 x 3.7 cm. Normal corticomedullary differentiation. No hydronephrosis.   Small amounts of perihepatic ascites.              Impression  IMPRESSION:   Small amounts of perihepatic ascites.  IR Results (maximum last 3):   No results found for this or any previous visit.      VAS/US Results (maximum last 3):   No results found for this or any previous visit.         Total clinical care time was 53mnutes of which more than 50% was spent in coordination of care and counseling (time spent with patient/family face to face, physical exam, reviewing laboratory  and imaging investigations, speaking with physicians and nursing staff involved in this patient's care).          RPerfecto Kingdom MD   [facility]   August 19, 2018, 9:18 AM

## 2018-08-19 NOTE — Progress Notes (Signed)
ADULT MALNUTRITION GUIDELINES    Patient is a 28 y.o. male, admitted on 08/18/2018 with a diagnosis of Intractable nausea and vomiting [R11.2]  Metabolic acidosis [E87.2]  HIV (human immunodeficiency virus infection) (HCC) [B20]  Diarrhea [R19.7].  Nutrition assessment was completed by RD and the patient was found to meet the following malnutrition criteria established by [facility]EN/AND:    Adult Malnutrition Guidelines:  SEVERE PROTEIN CALORIE MALNUTRITION IN THE CONTEXT OF CHRONIC ILLNESS  Weight loss: >20.5% x 2-3 month  Energy intake: <50% energy intake compared to estimated energy needs x >1 month   Other Considerations: Newly dx HIV    Please document Severe Protein Calorie Malnutrition in Problem List if in agreement    NUTRITION RECOMMENDATIONS:   . Diet just advanced to soft solids.  . Change ONS to Ensure Pudding (each provides 170 kcal, 4g protein) per pt request/preferance.  . Rec MVI/min and thiamine supplementation.  . Monitor lytes and replete prn.  . Daily wts to trend.     NUTRITION INITIAL EVALUATION    NUTRITION ASSESSMENT:       Reason for assessment: MST    Admitting diagnosis:   Intractable nausea and vomiting [R11.2]  Metabolic acidosis [E87.2]  HIV (human immunodeficiency virus infection) (HCC) [B20]  Diarrhea [R19.7]     PMH:   Past Medical History:   Diagnosis Date   . Asthma    . Hypertension    . Neurological disorder     Bell's palsy       Current pertinent medications: LR @ 100 ml/hr, zofran prn    Pertinent labs: 6/23: K 3.3 (on LR), BUN/Cr 33/1.6, GFR 54/60 (metabolic acidosis)    Anthropometrics & Physical Assessment:   Height:   Ht Readings from Last 3 Encounters:   08/18/18 5\' 9"  (1.753 m)   08/06/18 5\' 7"  (1.702 m)   07/21/18 5\' 9"  (1.753 m)      Weight:   Wt Readings from Last 5 Encounters:   08/19/18 61.6 kg (135 lb 12.9 oz)   08/12/18 72.2 kg (159 lb 2.8 oz)   07/29/18 71 kg (156 lb 8.4 oz)   04/10/18 85.7 kg (189 lb)   02/14/16 87.5 kg (193 lb)           BMI: Body mass index is  20.05 kg/m.    IBW: Ideal body weight: 70.7 kg (155 lb 13.8 oz)           UBW: 189 lb (04/10/18) per EMR; 165-170 lb per pt (April 2020)    Wt change: Using pt reported wt 170 lb: wt loss -35 lb (20.5%) x 2-3 months         [x]  significant       []  not significant         []  intended         [x]  not intended    GI symptoms/issues:       Reports constant diarrhea and vomiting  Last Bowel Movement Date: 08/18/18  Stool Appearance: Loose, Watery, Soft  Abdominal Assessment: Diarrhea, Nausea, Vomiting  Bowel Sounds: Active , Hyperactive     Chewing/swallowing issues:   None reported    Skin integrity:   intact       Muscle Wasting:  . Temporal: mild  . Clavicle: mild    Fat Wasting:  . Suborbital: mild             Fluid accumulation:    none       Diet and  intake history:    Current diet order:   DIET DENTAL SOFT (SOFT SOLID)  DIET NUTRITIONAL SUPPLEMENTS     Food allergies: none     Diet/intake history: Pt reports having an appetite but can't keep anything down d/t constant n/v/d. States being a slow eater. Per MD note, pt not eating any solid foods and drinking Gatorade lately           []  <50% intake x >5 days         [x]  <50% intake x >1 month         []  <75% intake x >7 days         []  <75% intake x 1 month         []  <75% intake x 3 months     Current appetite/PO intake:   No data recorded   Monitor intake per EMR documentation once information becomes available     Assessment of Current MNT:   Currently no diet ordered which is inadequate to meet needs. Advance diet as tolerated to regular when medically able.     Estimated daily nutrition intake needs:   1860 - 2170 kcals (30-35 kcal/kg)   74 - 93 g protein (1.2-1.5 g/kg)   1550 - 2170 mL fluid (25-35 ml/kg)    NUTRITION DIAGNOSIS:     1. Severe Protein Calorie Malnutrition in context of chronic illness related to suboptimal protein-energy intake 2/2 constant n/v/d and newly dx HIV as evidenced by weight loss of 35 lb (20.5%) x 2-3 months, </=50% of  estimated energy requirements for >/=1 month.    NUTRITION INTERVENTION / RECOMMENDATIONS:     . Diet just advanced to soft solids.  . Change ONS to Ensure Pudding (each provides 170 kcal, 4g protein) per pt request/preferance.  . Rec MVI/min and thiamine supplementation.  . Monitor lytes and replete prn.  . Daily wts to trend.     NUTRITION MONITORING AND EVALUATION:     Nutrition level of care:  []  low       []  moderate      [x]  high    Nutrition monitoring: MNT initiation and tolerance, wt, labs (BG, CMP), medical changes & treatment plan of care    Nutrition goals:  Pt to meet/tolerate >75% of estimated needs, maintain weight throughout LOS, BG control <180/nutrition-related labs WNL, maintain skin integrity.    Nilsa Nutting, MS, RD  08/19/18  Pager # [phone]

## 2018-08-19 NOTE — Progress Notes (Signed)
D/C Plan:  Home with home health (would like for patient to get PCA services through Medicaid)  No DME anticipated.  Family will transport him home    Anticipated date of D/C -  08/23/2018    Case Management Assessment  Patient lives with his mother in a 2 story home.  Patient has his own bedroom and bathroom.  Patient does not drive.  His mother takes him to all of his medical appointments/RX.  Patient's health has deteriorated over the last couple month.  Patient is not able to care for himself in the home.  The mother is trying to assist with his care but is unable to meet the demands.  SW spoke with patient's mother about the possibility of getting a PCA in the home to assist.   SW will request a [facility] for PCA in the home    Specialists:     Dialysis Unit:      Pharmacy: [facility]   When patient was asked: Do you have any difficulty paying for or obtaining medications? The patient replied: Mother completes paperwork    DME: None  Consults:    Therapy Recommendations:    OT:     PT:      SLP:        Oncology Navigator Referral:  N    [facility] Consulted: N    Wound Care:  N    When patient was asked: Does the patient have appropriate clothing available to be worn at discharge? The patient replied  yes    Care Management Interventions  Readmission Interview Completed: Yes  PCP Verified by CM: Yes([name])  Palliative Care Criteria Met (RRAT>21 & CHF Dx)?: No  Mode of Transport at Discharge: Other (see comment)  Transition of Care Consult (CM Consult): Discharge Planning, Home Health  Comfort Care: Yes  MyChart Signup: Yes  Discharge Durable Medical Equipment: No  Physical Therapy Consult: Yes  Occupational Therapy Consult: Yes  Speech Therapy Consult: No  Current Support Network: Relative's Home(lives with family (mom))  Reason for Referral: DCP Rounds  History Provided By: Child/Family(Mother)  Support System Response: Cooperative, Concerned  Previous Living  Arrangement: Lives with Family Dependent  Home Accessibility: Steps, Multi Level Home(1 step 12 steps to second floor)  Prior Functional Level: Assistance with the following:, Mobility, Shopping, Housework, Other (see comment), Cooking  Current Functional Level: Assistance with the following:, Mobility, Shopping, Other (see comment), Cooking, Housework  Can patient return to prior living arrangement: Yes  Ability to make needs known:: Good  Family able to assist with home care needs:: Yes  Would you like for me to discuss the discharge plan with any other family members/significant others, and if so, who?: Yes(Ms. Shon Baton [phone])  Pets: None  Problems related to education and literacy?: No  Problems related to employment and unemployment?: No  Occupational exposure to risk factors?: No  Problems related to housing and economic circumstances?: No  Problems related to social environment?: No  Problems related to upbringing?: No  Problems related to primary support group, including family circumstances?: No  Problems related to psychosocial circumstances?: No  Child psychotherapist Referral: [facility]/96(would like to look at PCA services in the home)  Types of Needs Identified: Disease Management Education, ADLs/IADLs  Anticipated Discharge Needs: Home Health Services  Confirm Follow Up Transport: Self  Confirm Transport and Arrange: No  The Plan for Transition of Care is Related to the Following Treatment Goals : Get better, doctors sit  down with me face to face and tell me everything that is going on.  The Patient and/or Patient Representative was Provided with a Choice of Provider and Agrees with the Discharge Plan?: Yes  Freedom of Choice List was Provided with Basic Dialogue that Supports the Patient's Individualized Plan of Care/Goals, Treatment Preferences and Shares the Quality Data Associated with the Providers?: Yes  Veteran Resource Information Provided?: No  Discharge Location  Discharge Placement: Home with home  health

## 2018-08-19 NOTE — Progress Notes (Signed)
Problem: Pain  Goal: *Control of Pain  Outcome: Progressing Towards Goal     Problem: Falls - Risk of  Goal: *Absence of Falls  Description: Document Schmid Fall Risk and appropriate interventions in the flowsheet.  Outcome: Progressing Towards Goal  Note: Fall Risk Interventions:  Mobility Interventions: Assess mobility with egress test, Bed/chair exit alarm, Communicate number of staff needed for ambulation/transfer, Patient to call before getting OOB         Medication Interventions: Assess postural VS orthostatic hypotension, Bed/chair exit alarm, Patient to call before getting OOB, Teach patient to arise slowly    Elimination Interventions: Bed/chair exit alarm, Call light in reach    History of Falls Interventions: Bed/chair exit alarm, Vital signs minimum Q4HRs X 24 hrs (comment for end date)

## 2018-08-19 NOTE — ED Notes (Signed)
TRANSFER - OUT REPORT:    Verbal report given to Fleet Contras, RN(name) on [facility]  being transferred to [address] East (unit) for routine progression of care       Report consisted of patient's Situation, Background, Assessment and   Recommendations(SBAR).     Information from the following report(s) SBAR was reviewed with the receiving nurse.    Lines:   Peripheral IV 08/18/18 Right Antecubital (Active)   Site Assessment Clean, dry, & intact 08/18/2018 10:49 PM   Phlebitis Assessment 0 08/18/2018 10:49 PM   Infiltration Assessment 0 08/18/2018 10:49 PM   Dressing Status Clean, dry, & intact 08/18/2018 10:49 PM   Dressing Type Transparent 08/18/2018 10:49 PM   Hub Color/Line Status Pink;Flushed 08/18/2018 10:49 PM   Action Taken Blood drawn 08/18/2018 10:49 PM        Opportunity for questions and clarification was provided.      Patient transported with:   [employer]

## 2018-08-19 NOTE — Progress Notes (Signed)
Problem: Pain  Goal: *Control of Pain  Outcome: Progressing Towards Goal     Problem: Falls - Risk of  Goal: *Absence of Falls  Description: Document Schmid Fall Risk and appropriate interventions in the flowsheet.  Outcome: Progressing Towards Goal  Note: Fall Risk Interventions:  Mobility Interventions: Assess mobility with egress test, Bed/chair exit alarm, Communicate number of staff needed for ambulation/transfer, Patient to call before getting OOB     Medication Interventions: Assess postural VS orthostatic hypotension, Bed/chair exit alarm, Patient to call before getting OOB, Teach patient to arise slowly    Elimination Interventions: Bed/chair exit alarm, Call light in reach    History of Falls Interventions: Bed/chair exit alarm, Vital signs minimum Q4HRs X 24 hrs (comment for end date)

## 2018-08-20 LAB — CBC
Hematocrit: 33.6 % — ABNORMAL LOW (ref 37.0–50.0)
Hemoglobin: 11.7 gm/dl — ABNORMAL LOW (ref 12.4–17.2)
MCH: 28.3 pg (ref 23.0–34.6)
MCHC: 34.8 gm/dl (ref 30.0–36.0)
MCV: 81.4 fL (ref 80.0–98.0)
MPV: 13.2 fL — ABNORMAL HIGH (ref 6.0–10.0)
Platelets: 174 10*3/uL (ref 140–450)
RBC: 4.13 M/uL (ref 3.80–5.70)
RDW-SD: 43.8 (ref 35.1–43.9)
WBC: 3.2 10*3/uL — ABNORMAL LOW (ref 4.0–11.0)

## 2018-08-20 LAB — BASIC METABOLIC PANEL
Anion Gap: 9 mmol/L (ref 5–15)
BUN: 19 mg/dl (ref 7–25)
CO2: 18 mEq/L — ABNORMAL LOW (ref 21–32)
Calcium: 7.8 mg/dl — ABNORMAL LOW (ref 8.5–10.1)
Chloride: 112 mEq/L — ABNORMAL HIGH (ref 98–107)
Creatinine: 0.9 mg/dl (ref 0.6–1.3)
EGFR IF NonAfrican American: >60
GFR African American: >60
Glucose: 78 mg/dl (ref 74–106)
Potassium: 2.7 mEq/L — CL (ref 3.5–5.1)
Sodium: 139 mEq/L (ref 136–145)

## 2018-08-20 LAB — GRAM STAIN

## 2018-08-20 LAB — CRYPTOSPOR/GIARDIA AG
CRYPTOSPORIDIUM, CRYPTOSPORIDIUM: POSITIVE — AB
Cryptosporidium: POSITIVE — AB
Giardia Ag: NEGATIVE
Giardia Ag: NEGATIVE

## 2018-08-20 LAB — POTASSIUM
Potassium: 3.3 mEq/L — ABNORMAL LOW (ref 3.5–5.1)
Potassium: 3.3 mEq/L — ABNORMAL LOW (ref 3.5–5.1)

## 2018-08-20 LAB — CBC W/O DIFF
HCT: 33.6 % — ABNORMAL LOW (ref 37.0–50.0)
HGB: 11.7 gm/dl — ABNORMAL LOW (ref 12.4–17.2)
MCH: 28.3 pg (ref 23.0–34.6)
MCHC: 34.8 gm/dl (ref 30.0–36.0)
MCV: 81.4 fL (ref 80.0–98.0)
MPV: 13.2 fL — ABNORMAL HIGH (ref 6.0–10.0)
PLATELET: 174 10*3/uL (ref 140–450)
RBC: 4.13 M/uL (ref 3.80–5.70)
RDW-SD: 43.8 (ref 35.1–43.9)
WBC: 3.2 10*3/uL — ABNORMAL LOW (ref 4.0–11.0)

## 2018-08-20 LAB — METABOLIC PANEL, BASIC
Anion gap: 9 mmol/L (ref 5–15)
BUN: 19 mg/dl (ref 7–25)
CO2: 18 mEq/L — ABNORMAL LOW (ref 21–32)
Calcium: 7.8 mg/dl — ABNORMAL LOW (ref 8.5–10.1)
Chloride: 112 mEq/L — ABNORMAL HIGH (ref 98–107)
Creatinine: 0.9 mg/dl (ref 0.6–1.3)
GFR est AA: >60
GFR est non-AA: >60
Glucose: 78 mg/dl (ref 74–106)
Potassium: 2.7 mEq/L — CL (ref 3.5–5.1)
Sodium: 139 mEq/L (ref 136–145)

## 2018-08-20 MED ORDER — POTASSIUM CHLORIDE SR 20 MEQ TAB, PARTICLES/CRYSTALS
20 mEq | ORAL | Status: AC
Start: 2018-08-20 — End: 2018-08-20
  Administered 2018-08-20 (×2): via ORAL

## 2018-08-20 MED ORDER — D5-1/2 NS & POTASSIUM CHLORIDE 40 MEQ/L IV
40 mEq/L | INTRAVENOUS | Status: DC
Start: 2018-08-20 — End: 2018-08-24
  Administered 2018-08-20 – 2018-08-24 (×6): via INTRAVENOUS

## 2018-08-20 MED FILL — SODIUM BICARBONATE 650 MG TAB: 650 mg | ORAL | Qty: 2

## 2018-08-20 MED FILL — ONDANSETRON (PF) 4 MG/2 ML INJECTION: 4 mg/2 mL | INTRAMUSCULAR | Qty: 2

## 2018-08-20 MED FILL — POTASSIUM CHLORIDE SR 20 MEQ TAB, PARTICLES/CRYSTALS: 20 mEq | ORAL | Qty: 2

## 2018-08-20 MED FILL — DICYCLOMINE 10 MG CAP: 10 mg | ORAL | Qty: 1

## 2018-08-20 MED FILL — LOVENOX 30 MG/0.3 ML SUB-Q SYRINGE: 30 mg/0.3 mL | SUBCUTANEOUS | Qty: 0.3

## 2018-08-20 MED FILL — FLUCONAZOLE IN SALINE (ISO-OSMOTIC) 200 MG/100 ML IV PIGGY BACK: 200 mg/100 mL | INTRAVENOUS | Qty: 100

## 2018-08-20 MED FILL — ALBENDAZOLE 200 MG TAB: 200 mg | ORAL | Qty: 2

## 2018-08-20 MED FILL — METRONIDAZOLE IN SODIUM CHLORIDE (ISO-OSM) 500 MG/100 ML IV PIGGY BACK: 500 mg/100 mL | INTRAVENOUS | Qty: 100

## 2018-08-20 NOTE — Progress Notes (Signed)
Problem: Pain  Goal: *Control of Pain  Outcome: Progressing Towards Goal

## 2018-08-20 NOTE — Progress Notes (Signed)
Mother updated on room change

## 2018-08-20 NOTE — Other (Signed)
Bedside and Verbal shift change report given to K.Benjamin RN (oncoming nurse) by Trinidad Sarmiento RN (offgoing nurse). Report included the following information SBAR, Kardex and MAR.

## 2018-08-20 NOTE — Progress Notes (Signed)
PAGER ID: [phone] Dr. Simoes  MESSAGE: [phone] Travis [name]. Potassium 2.7 this morning. Metrine, RN [id number]

## 2018-08-20 NOTE — Progress Notes (Signed)
PAGER ID: [phone] Dr. Sandie Ano  MESSAGE: [phone] [name]. Potassium results 3.3. Metrine, RN [phone]

## 2018-08-20 NOTE — Other (Signed)
Patient reports 2 brown, watery stools during the shift.

## 2018-08-20 NOTE — Progress Notes (Signed)
INFECTIOUS DISEASE PROGRESS NOTE       Requested by: Dr Sandie AnoSimoes    Reason for consult: N/V/D in immunocopromised host    Date of admission: 08/18/2018        ABX:      Current abx Prior abx    Albendazole 400 mg bid  6/24   1    X 7 d  Flagyl 250 mg po bid                   1     x 7 d    Fluconazole 6/24                          1  nitazoxinide 6/13    11d      ASSESSMENT:        N/V/D/ dehydration    Uncontrolled parasitic infection of GI ( see below) likely-     Giardia should have responded but 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 in HIV- reconstitution of immune system with ART will help immensely     Cannot start ART till genomic testing reviewed^^     Will initiate albendazol/flagyl  For rx of recalcitrant giardia    Oral candidiasis and suspect esophagitis      cryptosporidia/ Giardia concomitant infection   source unknown     D 11 of nitazoxinide PTA (see above)        HIV- new diagnosis    VL > 1 mil    cd4  17 abs/ 2%    hiv genomic testing for resistance sent 6/15- still IP     Once available, start ART   ^^ genomic resistance testing sent 6/15- head of lab- Britta MccreedyBarbara shields called reference lab and was told will be done by 7/3-- She asked for lab to be  expeditied       Pt also had lab done at [facility] last fri. wil call on Monday for ? Result of testing    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    ho neutropenia with adequate anc                nkda            RECOMMENDATIONS:      Began albendazol 400 mg po bid plus flagyl 250 mg IV bid x 7 d   On 6/24  Rehydration per IM  Agree with fluconazole- improving  Hold azithro for now- if no nausea, restart next week    pcp prophylaxis w/ bactrim          HIV testing + HIV An, pcr,      cd4  2%; 17 absolute   , genomic testing for resistance ordered 6/15- IP- see above       continue hydration- per IM  Pt to document stool frequency for accuracy  DW PT, Mother, dr simoes and nursing staff             MICROBIOLOGY:       PTA  5/27  Neg c diff  6/2    Stool culture neg  6/12  + for BOTH giardia and cryptosporidia-- lab repeated test with same results    Currrent  6/24  Stool neg CDI           Stool cullture IP        cc       ??  Past medical history:  Past Medical History:   Diagnosis Date   ??? Asthma    ??? Hypertension    ??? Neurological disorder     Bell's palsy       History reviewed. No pertinent surgical history.         Current Medications:     Current Facility-Administered Medications   Medication Dose Route Frequency   ??? ondansetron (ZOFRAN) injection 4 mg  4 mg IntraVENous Q6H PRN   ??? acetaminophen (TYLENOL) tablet 650 mg  650 mg Oral Q6H PRN   ??? albuterol-ipratropium (DUO-NEB) 2.5 MG-0.5 MG/3 ML  3 mL Nebulization Q6H PRN   ??? dicyclomine (BENTYL) capsule 10 mg  10 mg Oral Q6H PRN   ??? naloxone (NARCAN) injection 0.1 mg  0.1 mg IntraVENous PRN   ??? enoxaparin (LOVENOX) injection 30 mg  30 mg SubCUTAneous Q24H   ??? fluconazole (DIFLUCAN) 200mg /100 mL IVPB (premix)  200 mg IntraVENous Q24H   ??? sodium bicarbonate tablet 1,300 mg  1,300 mg Oral TID   ??? albendazole (ALBENZA) tablet 400 mg  400 mg Oral BID WITH MEALS   ??? metroNIDAZOLE 250 mg  in sodium chloride IVPB  250 mg IntraVENous Q12H   ??? [START ON 08/21/2018] trimethoprim-sulfamethoxazole (BACTRIM DS, SEPTRA DS) 160-800 mg per tablet 1 Tab  1 Tab Oral Q MON, WED & FRI         Physical Exam:  Vitals  Temp (24hrs), Avg:98 ??F (36.7 ??C), Min:97.5 ??F (36.4 ??C), Max:98.4 ??F (36.9 ??C)    Visit Vitals  BP 129/71 (BP 1 Location: Left arm, BP Patient Position: Supine)   Pulse 76   Temp 98.1 ??F (36.7 ??C)   Resp 20   Ht 5\' 9"  (1.753 m)   Wt 61.6 kg (135 lb 12.9 oz)   SpO2 99%   BMI 20.05 kg/m??       General: Well-developed, 28 y.o. year-old, male, in no acute distress  HEENT: Normocephalic, anicteric sclerae, Pupils equal, round reactive to light, no oropharyngeal lesions. No sinus tenderness.  Neck: Supple, no lymphadenopathy, masses or thyromegaly   Chest: Symmetrical expansion  Lungs: Clear to auscultation bilaterally, no dullness  Heart: Regular rhythm, no murmur, no rub or gallop, No JVD  Abdomen: Soft, TTP, diffusely. No rebound.non distended, no organomegaly, BS+. Neg guiac of emesis in ED  Musculoskeletal: Normal strength/tone. No edema. No clubbing or cyanosis  CNS: AAOx3. Cranial nerves II-XII intact. Grossly normal.No NR  SKIN: No skin lesion or rash. Dry, warm, intact          Labs: Results:   Chemistry Recent Labs     08/20/18  0514 08/18/18  2200   GLU 78 115*   NA 139 138   K 2.7* 3.3*   CL 112* 112*   CO2 18* 13*   BUN 19 33*   CREA 0.9 1.6*   CA 7.8* 9.1   AGAP 9 13   AP  --  89   TP  --  9.7*   ALB  --  3.5      CBC w/Diff Recent Labs     08/18/18  2200   WBC 4.8   RBC 5.84*   HGB 16.4   HCT 46.4   PLT 240   GRANS 63.0   LYMPH 21.0*   EOS 5.2*      Microbiology No results for input(s): CULT in the last 72 hours.       [facility]-    [facility]  XR CHEST SNGL V    Narrative INDICATION:  SOB       EXAMINATION:  XR CHEST SNGL V    COMPARISON:  02/14/2016    FINDINGS:  The study shows a normal sized heart.. The lungs are clear and well expanded.           Impression IMPRESSION:  Normal chest         Results from Hospital Encounter encounter on 08/06/18   CT ABD PELV WO CONT    Narrative CT Abdomen and Pelvis without    Indication: Abdominal pain. Nausea, vomiting, diarrhea.    Comparison: 07/22/2018.    TECHNIQUE:   CT of the abdomen and pelvis WITHOUT intravenous contrast. Coronal and sagittal  reformations were obtained. Oral contrast was given.    All CT exams at this facility use one or more dose reduction techniques  including automatic exposure control, mA/kV adjustment per patient's size, or  iterative reconstruction technique. DICOM format imaged data is available to  non-affiliated external healthcare facilities or entities on a secure, media   free, reciprocal searchable basis with patient authorization for 12 months  following the date of the study.    DISCUSSION:    ABSENCE OF INTRAVENOUS CONTRAST DECREASES SENSITIVITY FOR DETECTION OF FOCAL  LESIONS AND VASCULAR PATHOLOGY.    LOWER THORAX: Normal.    HEPATOBILIARY: No focal hepatic lesions.  No biliary ductal dilatation.  SPLEEN: No splenomegaly.  PANCREAS: No focal masses or ductal dilatation.    ADRENALS: No adrenal nodules.  KIDNEYS/URETERS: No hydronephrosis, stones, or solid mass lesions.  PELVIC ORGANS/BLADDER: Unremarkable.    PERITONEUM / RETROPERITONEUM: No free air or fluid.  LYMPH NODES: No lymphadenopathy.  VESSELS: Unremarkable.    GI TRACT: Multiple nondistended fluid-filled loops of small bowel with  questionable mild wall thickening. Fluid-filled colon with no significant wall  thickening. No obstruction. Normal appendix.    BONES AND SOFT TISSUES: No acute abnormality.      Impression IMPRESSION:    Numerous fluid-filled loops of small and large bowel with borderline diffuse  small bowel wall thickening, concerning for infectious or inflammatory  enterocolitis. Findings have slightly progressed since 07/22/2018.         ---------------------------------------------------------------------------------------------------------------  I have independently examined the patient and reviewed all lab studies and imgaing as well as review of nursing notes and physican notes     32 min spent with > 50% of time spent counseling and coordination of care  Jearld Adjutant, MD  08/20/2018    [facility]  [phone]

## 2018-08-20 NOTE — Progress Notes (Signed)
Transfer in report received by Tina RN, SBAR reviewed.   Received to unit A&Ox4, oriented to unit, states done with dinner.  Pt states had total 3 loose stool earlier today. Aware to inform staff to monitor and record amount number of stools. Pt on phone sitting at side of bed. Offered fluids, no distress.

## 2018-08-20 NOTE — Progress Notes (Signed)
INTERNAL MEDICINE PROGRESS NOTE    Date of note:      August 20, 2018    Patient:               Travis [name], 28 y.o., male  Admit Date:        08/18/2018  Length of Stay:  1 day(s)    Problem List:   Patient Active Problem List   Diagnosis Code   ??? AKI (acute kidney injury) ([place]) N17.9   ??? Dehydration E86.0   ??? Enterocolitis K52.9   ??? Intractable nausea and vomiting R11.2   ??? Diarrhea R19.7   ??? Metabolic acidosis Z61.0   ??? HIV (human immunodeficiency virus infection) ([place]) B20       Subjective + interval history     Had 5 episodes of diarrhea overnight.  Able to eat some food.  Abdominal cramping on and off which is improving with Bentyl.    Assessment:   ??  1. Nausea, vomiting, dehydration  2. 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 Acute kidney injury present on admission  4. Metabolic acidosis???could be due to severe diarrhea or renal tubular acidosis  5. Oral candidiasis  6. 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 Severe protein calorie malnutrition  ??  ??  Plan:   ??  Continues to be dehydrated with severe hypokalemia potassium 2.7 today.  [name] and oral replacement ordered.  Recheck potassium at 2 PM.    Urine anion gap is negative.  Most likely metabolic acidosis is from GI losses.  Continue sodium bicarb tablet.    AKI has resolved.  Monitor closely    Patient continues to have diarrhea.  Regimen switched from nitazoxanide to albendazole plus Flagyl.  ID is on board.     Soft solid diet.  Encourage oral intake    Continue [name] fluconazole for oropharyngeal candidiasis    Bactrim 3 times a week for PCP prophylaxis    Lovenox for DVT prophylaxis    Discussed with Dr. Glendora Score.  We will try to expedite HIV genomic testing so that patient can be initiated on HAART as soon as possible.  If his immunity improves, his diarrhea might improve.    Also discussed at length with patient's mom on phone      - Code status: Full code    Recommend to continue hospitalization.  Expected date of discharge: TBD   Plan for disposition - tbd    Objective:       Visit Vitals  BP 129/71 (BP 1 Location: Left arm, BP Patient Position: Supine)   Pulse 76   Temp 98.1 ??F (36.7 ??C)   Resp 20   Ht 5\' 9"  (1.753 m)   Wt 61.6 kg (135 lb 12.9 oz)   SpO2 99%   BMI 20.05 kg/m??             Intake/Output Summary (Last 24 hours) at 08/20/2018 0830  Last data filed at 08/20/2018 0540  Gross per 24 hour   Intake 200 ml   Output ???   Net 200 ml       Physical Exam:     ??  GEN - AAOx3, anxious, cachectic  HEENT - mucous membranes moist; oral candidiasis is improving  Neck - supple, no JVD  Cardiac - RRR, S1, S2, no murmurs  Chest/Lungs - clear to auscultation without wheezes or rhonchi  Abdomen - soft, mild tenderness without guarding or rigidity  Extremities - no clubbing/ cyanosis/  edema  Neuro - CN 2-12 intact. No focal deficits. No motor or sensory deficit appreciated.   Skin - no rashes or lesions    Current medications:       Current Facility-Administered Medications:   ???  dextrose 5% - 0.45% NaCl with KCl 40 mEq/L infusion, , IntraVENous, CONTINUOUS, Keimya Briddell S, MD  ???  potassium chloride (K-DUR, KLOR-CON) SR tablet 40 mEq, 40 mEq, Oral, Q4H, Zandyr Barnhill S, MD  ???  ondansetron (ZOFRAN) injection 4 mg, 4 mg, IntraVENous, Q6H PRN, Deyona Soza S, MD, 4 mg at 08/20/18 0439  ???  acetaminophen (TYLENOL) tablet 650 mg, 650 mg, Oral, Q6H PRN, Axcel Horsch S, MD  ???  albuterol-ipratropium (DUO-NEB) 2.5 MG-0.5 MG/3 ML, 3 mL, Nebulization, Q6H PRN, Adelyne Marchese S, MD  ???  dicyclomine (BENTYL) capsule 10 mg, 10 mg, Oral, Q6H PRN, Gildardo Tickner S, MD, 10 mg at 08/20/18 0439  ???  naloxone (NARCAN) injection 0.1 mg, 0.1 mg, IntraVENous, PRN, Albeiro Trompeter, Reina Fuseuchita S, MD  ???  enoxaparin (LOVENOX) injection 30 mg, 30 mg, SubCUTAneous, Q24H, Meriam Chojnowski S, MD, 30 mg at 08/19/18 1341  ???  fluconazole (DIFLUCAN) 200mg /100 mL IVPB (premix), 200 mg, IntraVENous, Q24H, Micah Galeno S, MD, Last Rate: 100 mL/hr at 08/19/18 1134, 200 mg at 08/19/18 1134   ???  sodium bicarbonate tablet 1,300 mg, 1,300 mg, Oral, TID, Iva Posten S, MD, 1,300 mg at 08/19/18 2223  ???  albendazole (ALBENZA) tablet 400 mg, 400 mg, Oral, BID WITH MEALS, Alfonso EllisMooney, Martha, MD, 400 mg at 08/19/18 1712  ???  metroNIDAZOLE 250 mg  in sodium chloride IVPB, 250 mg, IntraVENous, Q12H, Alfonso EllisMooney, Martha, MD, Last Rate: 100 mL/hr at 08/20/18 0429, 250 mg at 08/20/18 0429  ???  [START ON 08/21/2018] trimethoprim-sulfamethoxazole (BACTRIM DS, SEPTRA DS) 160-800 mg per tablet 1 Tab, 1 Tab, Oral, Q MON, WED & Gita KudoFRI, Mooney, Martha, MD    Labs:     Recent Results (from the past 24 hour(s))   POTASSIUM, UR, RANDOM    Collection Time: 08/19/18  3:00 PM   Result Value Ref Range    Potassium urine, random 11.0 (L) 12.0 - 75.0 mEq/L   SODIUM, UR, RANDOM    Collection Time: 08/19/18  3:00 PM   Result Value Ref Range    Sodium,urine random 51 20 - 110 mEq/L   CHLORIDE, URINE RANDOM    Collection Time: 08/19/18  3:00 PM   Result Value Ref Range    Chloride,urine random 109 55 - 125 mEq/L   CULTURE, FUNGUS & STAIN    Collection Time: 08/19/18  3:45 PM   Result Value Ref Range    Fungal stain Budding Yeast     GRAM STAIN    Collection Time: 08/19/18  6:00 PM   Result Value Ref Range    GRAM STAIN Few WBC'S     CBC W/O DIFF    Collection Time: 08/20/18  5:14 AM   Result Value Ref Range    WBC 3.2 (L) 4.0 - 11.0 1000/mm3    RBC 4.13 3.80 - 5.70 M/uL    HGB 11.7 (L) 12.4 - 17.2 gm/dl    HCT 96.233.6 (L) 95.237.0 - 50.0 %    MCV 81.4 80.0 - 98.0 fL    MCH 28.3 23.0 - 34.6 pg    MCHC 34.8 30.0 - 36.0 gm/dl    PLATELET 841174 324140 - 401450 1000/mm3    MPV 13.2 (H) 6.0 - 10.0 fL    RDW-SD 43.8  35.1 - 43.9     METABOLIC PANEL, BASIC    Collection Time: 08/20/18  5:14 AM   Result Value Ref Range    Sodium 139 136 - 145 mEq/L    Potassium 2.7 (LL) 3.5 - 5.1 mEq/L    Chloride 112 (H) 98 - 107 mEq/L    CO2 18 (L) 21 - 32 mEq/L    Glucose 78 74 - 106 mg/dl    BUN 19 7 - 25 mg/dl    Creatinine 0.9 0.6 - 1.3 mg/dl    GFR est AA >16>60       GFR est non-AA >60      Calcium 7.8 (L) 8.5 - 10.1 mg/dl    Anion gap 9 5 - 15 mmol/L       XR Results:  Results from Hospital Encounter encounter on 08/18/18   XR CHEST SNGL V    Narrative INDICATION:  SOB       EXAMINATION:  XR CHEST SNGL V    COMPARISON:  02/14/2016    FINDINGS:  The study shows a normal sized heart.. The lungs are clear and well expanded.           Impression IMPRESSION:  Normal chest         CT Results:  Results from Hospital Encounter encounter on 08/06/18   CT ABD PELV WO CONT    Narrative CT Abdomen and Pelvis without    Indication: Abdominal pain. Nausea, vomiting, diarrhea.    Comparison: 07/22/2018.    TECHNIQUE:   CT of the abdomen and pelvis WITHOUT intravenous contrast. Coronal and sagittal  reformations were obtained. Oral contrast was given.    All CT exams at this facility use one or more dose reduction techniques  including automatic exposure control, mA/kV adjustment per patient's size, or  iterative reconstruction technique. DICOM format imaged data is available to  non-affiliated external healthcare facilities or entities on a secure, media  free, reciprocal searchable basis with patient authorization for 12 months  following the date of the study.    DISCUSSION:    ABSENCE OF INTRAVENOUS CONTRAST DECREASES SENSITIVITY FOR DETECTION OF FOCAL  LESIONS AND VASCULAR PATHOLOGY.    LOWER THORAX: Normal.    HEPATOBILIARY: No focal hepatic lesions.  No biliary ductal dilatation.  SPLEEN: No splenomegaly.  PANCREAS: No focal masses or ductal dilatation.    ADRENALS: No adrenal nodules.  KIDNEYS/URETERS: No hydronephrosis, stones, or solid mass lesions.  PELVIC ORGANS/BLADDER: Unremarkable.    PERITONEUM / RETROPERITONEUM: No free air or fluid.  LYMPH NODES: No lymphadenopathy.  VESSELS: Unremarkable.    GI TRACT: Multiple nondistended fluid-filled loops of small bowel with  questionable mild wall thickening. Fluid-filled colon with no significant wall   thickening. No obstruction. Normal appendix.    BONES AND SOFT TISSUES: No acute abnormality.      Impression IMPRESSION:    Numerous fluid-filled loops of small and large bowel with borderline diffuse  small bowel wall thickening, concerning for infectious or inflammatory  enterocolitis. Findings have slightly progressed since 07/22/2018.         MRI Results:  No results found for this or any previous visit.    Nuclear Medicine Results:  No results found for this or any previous visit.    US Results:  Results from Hospital Encounter encounter on 07/21/18   US RUQ    Narrative Clinical history: Elevated liver function studies    EXAMINATION:  Upper quadrant ultrasound 07/28/2018    FINDINGS:  Liver demonstrates homogeneous echotexture. No intrahepatic bile duct  dilatation. Bile duct measures 4 mm. No stones are seen in the gallbladder.  Gallbladder wall measures 2 mm. Sonographic Eulah PontMurphy sign is negative.    Pancreas, proximal aorta and IVC are within normal limits. Right kidney measures  10.4 x 5.6 x 3.7 cm. Normal corticomedullary differentiation. No hydronephrosis.  Small amounts of perihepatic ascites.      Impression IMPRESSION:  Small amounts of perihepatic ascites.         IR Results:  No results found for this or any previous visit.    VAS/US Results:  No results found for this or any previous visit.      Total clinical care time was 40  minutes of which more than 50% was spent in coordination of care and counseling (time spent with patient/family face to face, physical exam, reviewing laboratory and imaging investigations, speaking with physicians and nursing staff involved in this patient's care).     Tia MaskerUCHITA Avilyn Virtue,  MD  [facility]  [facility]  August 20, 2018  Time: 8:30 AM

## 2018-08-20 NOTE — Progress Notes (Signed)
Progress  Notes by Jearld Adjutant, MD at 08/20/18 0825                Author: Jearld Adjutant, MD  Service: Infectious Disease  Author Type: Physician       Filed: 08/20/18 1458  Date of Service: 08/20/18 0825  Status: Signed          Editor: Jearld Adjutant, MD (Physician)                                                                                       INFECTIOUS DISEASE PROGRESS NOTE          Requested by: Dr Ninetta Lights      Reason for consult: N/V/D in immunocopromised host      Date of admission: 08/18/2018              ABX:            Current abx  Prior abx      Albendazole 400 mg bid  6/24   1    X 7 d   Flagyl 250 mg po bid                   1     x 7 d      Fluconazole 6/24                          1   nitazoxinide 6/13    11d           ASSESSMENT:             N/V/D/ dehydration     Uncontrolled parasitic infection of GI ( see below) likely-      Giardia should have responded but 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 in HIV- reconstitution of immune system with ART will help immensely      Cannot start ART till genomic testing reviewed^^      Will initiate albendazol/flagyl  For rx of recalcitrant giardia      Oral candidiasis and suspect esophagitis         cryptosporidia/ Giardia concomitant infection    source unknown      D 11 of nitazoxinide PTA (see above)           HIV- new diagnosis     VL > 1 mil     cd4  17 abs/ 2%     hiv genomic testing for resistance sent 6/15- still IP      Once available, start ART    ^^ genomic resistance testing sent 6/15- head of lab- Pamala Hurry shields called reference lab and was told will be done by 7/3-- She asked for lab to be  expeditied         Pt also had lab done at [facility] last fri. wil call on Monday for ? Result of testing      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      ho neutropenia with adequate anc  nkda                   RECOMMENDATIONS:         Began albendazol 400 mg po bid plus flagyl 250 mg IV bid x 7 d   On 6/24   Rehydration per  IM   Agree with fluconazole- improving   Hold azithro for now- if no nausea, restart next week     pcp prophylaxis w/ bactrim               HIV testing + HIV An, pcr,       cd4  2%; 17 absolute    , genomic testing for resistance ordered 6/15- IP- see above         continue hydration- per IM   Pt to document stool frequency for accuracy   DW PT, Mother, dr simoes and nursing staff               MICROBIOLOGY:          PTA   5/27  Neg c diff   6/2    Stool culture neg   6/12  + for BOTH giardia and cryptosporidia-- lab repeated test with same results      Currrent   6/24  Stool neg CDI            Stool cullture IP              cc           ??     Past medical history:          Past Medical History:        Diagnosis  Date         ?  Asthma       ?  Hypertension       ?  Neurological disorder            Bell's palsy           History reviewed. No pertinent surgical history.               Current Medications:          Current Facility-Administered Medications          Medication  Dose  Route  Frequency           ?  ondansetron (ZOFRAN) injection 4 mg   4 mg  IntraVENous  Q6H PRN     ?  acetaminophen (TYLENOL) tablet 650 mg   650 mg  Oral  Q6H PRN     ?  albuterol-ipratropium (DUO-NEB) 2.5 MG-0.5 MG/3 ML   3 mL  Nebulization  Q6H PRN     ?  dicyclomine (BENTYL) capsule 10 mg   10 mg  Oral  Q6H PRN     ?  naloxone (NARCAN) injection 0.1 mg   0.1 mg  IntraVENous  PRN     ?  enoxaparin (LOVENOX) injection 30 mg   30 mg  SubCUTAneous  Q24H     ?  fluconazole (DIFLUCAN) 200mg /100 mL IVPB (premix)   200 mg  IntraVENous  Q24H     ?  sodium bicarbonate tablet 1,300 mg   1,300 mg  Oral  TID     ?  albendazole (ALBENZA) tablet 400 mg   400 mg  Oral  BID WITH MEALS     ?  metroNIDAZOLE 250 mg  in sodium chloride IVPB   250 mg  IntraVENous  Q12H           ?  [START ON 08/21/2018] trimethoprim-sulfamethoxazole (BACTRIM DS, SEPTRA DS) 160-800 mg per tablet 1 Tab   1 Tab  Oral  Q MON, WED & FRI              Physical Exam:   Vitals   Temp  (24hrs), Avg:98 ??F (36.7 ??C), Min:97.5 ??F (36.4 ??C), Max:98.4 ??F (36.9 ??C)      Visit Vitals      BP  129/71 (BP 1 Location: Left arm, BP Patient Position: Supine)        Pulse  76     Temp  98.1 ??F (36.7 ??C)     Resp  20     Ht  5\' 9"  (1.753 m)     Wt  61.6 kg (135 lb 12.9 oz)     SpO2  99%        BMI  20.05 kg/m??           General: Well-developed, 28 y.o. year-old, male , in no acute distress   HEENT: Normocephalic, anicteric sclerae, Pupils equal, round reactive to light, no oropharyngeal lesions. No sinus tenderness.   Neck: Supple, no lymphadenopathy, masses or thyromegaly   Chest: Symmetrical expansion   Lungs: Clear to auscultation bilaterally, no dullness   Heart: Regular rhythm, no murmur, no rub or gallop, No JVD   Abdomen: Soft, TTP, diffusely. No rebound.non distended, no organomegaly, BS+. Neg guiac of emesis in ED   Musculoskeletal: Normal strength/tone. No edema. No clubbing or cyanosis   CNS: AAOx3. Cranial nerves II-XII intact. Grossly normal.No NR   SKIN: No skin lesion or rash. Dry, warm, intact                 Labs:  Results:        Chemistry  Recent Labs         08/20/18   0514  08/18/18   2200      GLU  78  115*      NA  139  138      K  2.7*  3.3*      CL  112*  112*      CO2  18*  13*      BUN  19  33*      CREA  0.9  1.6*      CA  7.8*  9.1      AGAP  9  13      AP   --   89      TP   --   9.7*      ALB   --   3.5              CBC w/Diff  Recent Labs         08/18/18   2200      WBC  4.8      RBC  5.84*      HGB  16.4      HCT  46.4      PLT  240      GRANS  63.0      LYMPH  21.0*      EOS  5.2*              Microbiology  No results for input(s): CULT in the last 72 hours.           Imaging-  Results from Hospital Encounter encounter on 08/18/18     XR CHEST SNGL V           Narrative  INDICATION:   SOB         EXAMINATION:   XR CHEST SNGL V      COMPARISON:   02/14/2016      FINDINGS:   The study shows a normal sized heart.. The lungs are clear and well expanded.                     Impression  IMPRESSION:   Normal chest                Results from Hospital Encounter encounter on 08/06/18     CT ABD PELV WO CONT           Narrative  CT Abdomen and Pelvis without      Indication: Abdominal pain. Nausea, vomiting, diarrhea.      Comparison: 07/22/2018.      TECHNIQUE:    CT of the abdomen and pelvis WITHOUT intravenous contrast. Coronal and sagittal   reformations were obtained. Oral contrast was given.      All CT exams at this facility use one or more dose reduction techniques   including automatic exposure control, mA/kV adjustment per patient's size, or   iterative reconstruction technique. DICOM format imaged data is available to   non-affiliated external healthcare facilities or entities on a secure, media   free, reciprocal searchable basis with patient authorization for 12 months   following the date of the study.      DISCUSSION:      ABSENCE OF INTRAVENOUS CONTRAST DECREASES SENSITIVITY FOR DETECTION OF FOCAL   LESIONS AND VASCULAR PATHOLOGY.      LOWER THORAX: Normal.      HEPATOBILIARY: No focal hepatic lesions.  No biliary ductal dilatation.   SPLEEN: No splenomegaly.   PANCREAS: No focal masses or ductal dilatation.      ADRENALS: No adrenal nodules.   KIDNEYS/URETERS: No hydronephrosis, stones, or solid mass lesions.   PELVIC ORGANS/BLADDER: Unremarkable.      PERITONEUM / RETROPERITONEUM: No free air or fluid.   LYMPH NODES: No lymphadenopathy.   VESSELS: Unremarkable.      GI TRACT: Multiple nondistended fluid-filled loops of small bowel with   questionable mild wall thickening. Fluid-filled colon with no significant wall   thickening. No obstruction. Normal appendix.      BONES AND SOFT TISSUES: No acute abnormality.              Impression  IMPRESSION:      Numerous fluid-filled loops of small and large bowel with borderline diffuse   small bowel wall thickening, concerning for infectious or inflammatory   enterocolitis. Findings have slightly progressed since 07/22/2018.               ---------------------------------------------------------------------------------------------------------------   I have independently examined the patient and reviewed all lab studies and imgaing as well as review of nursing notes and physican notes       32 min spent with > 50% of time spent counseling and coordination of care   Alfonso EllisMartha Willy Pinkerton, MD   08/20/2018      [facility]   [phone]

## 2018-08-20 NOTE — Progress Notes (Signed)
Progress  Notes by Galvin Proffer at 08/20/18 1700                Author: Albin Fischer, Metrine  Service: --  Author Type: Registered Nurse       Filed: 08/20/18 1701  Date of Service: 08/20/18 1700  Status: Signed          Editor: Galvin Proffer (Registered Nurse)                        PAGER ID: [phone] Dr. Sandie Ano  MESSAGE: [phone] [name]. Potassium results 3.3. Metrine, RN [phone]

## 2018-08-20 NOTE — Progress Notes (Signed)
PAGER ID: [phone] Dr. Sandie Ano  MESSAGE: [name]. Potassium 2.7 this morning. Metrine, RN [phone]

## 2018-08-20 NOTE — Progress Notes (Signed)
Progress Notes by Salley SlaughterSimoes, Breniya Goertzen S, MD at 08/20/18 0830                Author: Salley SlaughterSimoes, Miquel Lamson S, MD  Service: Hospitalist  Author Type: Physician       Filed: 08/20/18 1459  Date of Service: 08/20/18 0830  Status: Signed          Editor: Salley SlaughterSimoes, Riham Polyakov S, MD (Physician)                          INTERNAL MEDICINE PROGRESS NOTE      Date of note:      August 20, 2018      Patient:               Travis [name],  28 y.o., male   Admit Date:        08/18/2018   Length of Stay:  1 day(s)      Problem List:      Patient Active Problem List        Diagnosis  Code         ?  AKI (acute kidney injury) (HCC)  N17.9     ?  Dehydration  E86.0     ?  Enterocolitis  K52.9     ?  Intractable nausea and vomiting  R11.2     ?  Diarrhea  R19.7     ?  Metabolic acidosis  E87.2         ?  HIV (human immunodeficiency virus infection) (HCC)  B20             Subjective + interval history        Had 5 episodes of diarrhea overnight.  Able to eat some food.  Abdominal cramping on and off which is improving with Bentyl.        Assessment:     ??   1.  Nausea, vomiting, dehydration   2.  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  Acute kidney injury present on admission   4.  Metabolic acidosis-could be due to severe diarrhea or renal tubular acidosis   5.  Oral candidiasis   6.  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  Severe protein calorie malnutrition   ??   ??     Plan:     ??   Continues to be dehydrated with severe hypokalemia potassium 2.7 today.  [name] and oral replacement ordered.  Recheck potassium at 2 PM.      Urine anion gap is negative.  Most likely metabolic acidosis is from GI losses.  Continue sodium bicarb tablet.      AKI has resolved.  Monitor closely      Patient continues to have diarrhea.  Regimen switched from nitazoxanide to albendazole plus Flagyl.  ID is on board.       Soft solid diet.  Encourage oral intake      Continue [name] fluconazole for oropharyngeal candidiasis      Bactrim 3 times a week for  PCP prophylaxis      Lovenox for DVT prophylaxis      Discussed with Dr. Glynda JaegerMooney.  We will try to expedite HIV genomic testing so that patient can be initiated on HAART as soon as possible.  If his immunity improves, his diarrhea might improve.      Also discussed at length with patient's mom on phone         -  Code status: Full code      Recommend to continue hospitalization.   Expected date of discharge: TBD   Plan for disposition - tbd        Objective:           Visit Vitals      BP  129/71 (BP 1 Location: Left arm, BP Patient Position: Supine)     Pulse  76     Temp  98.1 ??F (36.7 ??C)     Resp  20     Ht  5\' 9"  (1.753 m)     Wt  61.6 kg (135 lb 12.9 oz)     SpO2  99%        BMI  20.05 kg/m??                    Intake/Output Summary (Last 24 hours) at 08/20/2018 0830   Last data filed at 08/20/2018 0540     Gross per 24 hour        Intake  200 ml        Output  --        Net  200 ml             Physical Exam:        ??   GEN - AAOx3, anxious, cachectic   HEENT - mucous membranes moist; oral candidiasis is improving   Neck - supple, no JVD   Cardiac - RRR, S1, S2, no murmurs   Chest/Lungs - clear to auscultation without wheezes or rhonchi   Abdomen - soft, mild tenderness without guarding or rigidity   Extremities - no clubbing/ cyanosis/ edema   Neuro - CN 2-12 intact. No focal deficits. No motor or sensory deficit appreciated.    Skin - no rashes or lesions        Current medications:           Current Facility-Administered Medications:    ?  dextrose 5% - 0.45% NaCl with KCl 40 mEq/L infusion, , IntraVENous, CONTINUOUS, Kapil Petropoulos S, MD   ?  potassium chloride (K-DUR, KLOR-CON) SR tablet 40 mEq, 40 mEq, Oral, Q4H, Aydin Hink S, MD   ?  ondansetron (ZOFRAN) injection 4 mg, 4 mg, IntraVENous, Q6H PRN, Whitten Andreoni S, MD, 4 mg at 08/20/18 0439   ?  acetaminophen (TYLENOL) tablet 650 mg, 650 mg, Oral, Q6H PRN, Nayelis Bonito S, MD   ?  albuterol-ipratropium (DUO-NEB) 2.5 MG-0.5 MG/3 ML, 3 mL,  Nebulization, Q6H PRN, Roxanna Mcever S, MD   ?  dicyclomine (BENTYL) capsule 10 mg, 10 mg, Oral, Q6H PRN, Kanen Mottola S, MD, 10 mg at 08/20/18 0439   ?  naloxone (NARCAN) injection 0.1 mg, 0.1 mg, IntraVENous, PRN, Juliocesar Blasius S, MD   ?  enoxaparin (LOVENOX) injection 30 mg, 30 mg, SubCUTAneous, Q24H, Kiyla Ringler S, MD, 30 mg at 08/19/18 1341   ?  fluconazole (DIFLUCAN) 200mg /100 mL IVPB (premix), 200 mg, IntraVENous, Q24H, Rinaldo Macqueen S, MD, Last Rate: 100 mL/hr at 08/19/18 1134, 200 mg at 08/19/18 1134   ?  sodium bicarbonate tablet 1,300 mg, 1,300 mg, Oral, TID, Zymire Turnbo S, MD, 1,300 mg at 08/19/18 2223   ?  albendazole [facility](ALBENZA) tablet 400 mg, 400 mg, Oral, BID WITH MEALS, Alfonso EllisMooney, Martha, MD, 400 mg at 08/19/18 1712   ?  metroNIDAZOLE 250 mg  in sodium chloride IVPB, 250 mg, IntraVENous, Q12H, Alfonso EllisMooney, Martha, MD, Last Rate: 100 mL/hr at  08/20/18 0429, 250 mg at 08/20/18 0429   ?  [START ON 08/21/2018] trimethoprim-sulfamethoxazole (BACTRIM DS, SEPTRA DS) 160-800 mg per tablet 1 Tab, 1 Tab, Oral, Q MON, WED & Christin Bach, MD        Labs:          Recent Results (from the past 24 hour(s))     POTASSIUM, UR, RANDOM          Collection Time: 08/19/18  3:00 PM         Result  Value  Ref Range            Potassium urine, random  11.0 (L)  12.0 - 75.0 mEq/L       SODIUM, UR, RANDOM          Collection Time: 08/19/18  3:00 PM         Result  Value  Ref Range            Sodium,urine random  51  20 - 110 mEq/L       CHLORIDE, URINE RANDOM          Collection Time: 08/19/18  3:00 PM         Result  Value  Ref Range            Chloride,urine random  109  55 - 125 mEq/L       CULTURE, FUNGUS & STAIN          Collection Time: 08/19/18  3:45 PM         Result  Value  Ref Range            Fungal stain  Budding Yeast          GRAM STAIN          Collection Time: 08/19/18  6:00 PM         Result  Value  Ref Range            GRAM STAIN  Few WBC'S          CBC W/O DIFF          Collection Time:  08/20/18  5:14 AM         Result  Value  Ref Range            WBC  3.2 (L)  4.0 - 11.0 1000/mm3       RBC  4.13  3.80 - 5.70 M/uL       HGB  11.7 (L)  12.4 - 17.2 gm/dl       HCT  33.6 (L)  37.0 - 50.0 %       MCV  81.4  80.0 - 98.0 fL       MCH  28.3  23.0 - 34.6 pg       MCHC  34.8  30.0 - 36.0 gm/dl       PLATELET  174  140 - 450 1000/mm3       MPV  13.2 (H)  6.0 - 10.0 fL       RDW-SD  43.8  35.1 - 91.4         METABOLIC PANEL, BASIC          Collection Time: 08/20/18  5:14 AM         Result  Value  Ref Range            Sodium  139  136 - 145 mEq/L  Potassium  2.7 (LL)  3.5 - 5.1 mEq/L       Chloride  112 (H)  98 - 107 mEq/L       CO2  18 (L)  21 - 32 mEq/L       Glucose  78  74 - 106 mg/dl       BUN  19  7 - 25 mg/dl       Creatinine  0.9  0.6 - 1.3 mg/dl       GFR est AA  >96          GFR est non-AA  >60          Calcium  7.8 (L)  8.5 - 10.1 mg/dl            Anion gap  9  5 - 15 mmol/L           XR Results:     Results from Hospital Encounter encounter on 08/18/18     XR CHEST SNGL V           Narrative  INDICATION:   SOB         EXAMINATION:   XR CHEST SNGL V      COMPARISON:   02/14/2016      FINDINGS:   The study shows a normal sized heart.. The lungs are clear and well expanded.                    Impression  IMPRESSION:   Normal chest              CT Results:     Results from Hospital Encounter encounter on 08/06/18     CT ABD PELV WO CONT           Narrative  CT Abdomen and Pelvis without      Indication: Abdominal pain. Nausea, vomiting, diarrhea.      Comparison: 07/22/2018.      TECHNIQUE:    CT of the abdomen and pelvis WITHOUT intravenous contrast. Coronal and sagittal   reformations were obtained. Oral contrast was given.      All CT exams at this facility use one or more dose reduction techniques   including automatic exposure control, mA/kV adjustment per patient's size, or   iterative reconstruction technique. DICOM format imaged data is available to   non-affiliated external healthcare  facilities or entities on a secure, media   free, reciprocal searchable basis with patient authorization for 12 months   following the date of the study.      DISCUSSION:      ABSENCE OF INTRAVENOUS CONTRAST DECREASES SENSITIVITY FOR DETECTION OF FOCAL   LESIONS AND VASCULAR PATHOLOGY.      LOWER THORAX: Normal.      HEPATOBILIARY: No focal hepatic lesions.  No biliary ductal dilatation.   SPLEEN: No splenomegaly.   PANCREAS: No focal masses or ductal dilatation.      ADRENALS: No adrenal nodules.   KIDNEYS/URETERS: No hydronephrosis, stones, or solid mass lesions.   PELVIC ORGANS/BLADDER: Unremarkable.      PERITONEUM / RETROPERITONEUM: No free air or fluid.   LYMPH NODES: No lymphadenopathy.   VESSELS: Unremarkable.      GI TRACT: Multiple nondistended fluid-filled loops of small bowel with   questionable mild wall thickening. Fluid-filled colon with no significant wall   thickening. No obstruction. Normal appendix.      BONES AND SOFT TISSUES: No acute abnormality.  Impression  IMPRESSION:      Numerous fluid-filled loops of small and large bowel with borderline diffuse   small bowel wall thickening, concerning for infectious or inflammatory   enterocolitis. Findings have slightly progressed since 07/22/2018.              MRI Results:   No results found for this or any previous visit.      Nuclear Medicine Results:   No results found for this or any previous visit.      US Results:     Results from Hospital Encounter encounter on 07/21/18     US RUQ           Narrative  Clinical history: Elevated liver function studies      EXAMINATION:   Upper quadrant ultrasound 07/28/2018      FINDINGS:   Liver demonstrates homogeneous echotexture. No intrahepatic bile duct   dilatation. Bile duct measures 4 mm. No stones are seen in the gallbladder.   Gallbladder wall measures 2 mm. Sonographic Eulah PontMurphy sign is negative.      Pancreas, proximal aorta and IVC are within normal limits. Right kidney measures   10.4 x 5.6  x 3.7 cm. Normal corticomedullary differentiation. No hydronephrosis.   Small amounts of perihepatic ascites.              Impression  IMPRESSION:   Small amounts of perihepatic ascites.              IR Results:   No results found for this or any previous visit.      VAS/US Results:   No results found for this or any previous visit.         Total clinical care time was 40  minutes of which more than 50% was spent in coordination of care and counseling (time spent with patient/family face to face, physical exam, reviewing laboratory and imaging investigations, speaking with physicians and  nursing staff involved in this patient's care).       Tia MaskerUCHITA Nou Chard,  MD   [facility]   [facility]   August 20, 2018   Time: 8:30 AM

## 2018-08-20 NOTE — Progress Notes (Signed)
Transfer in report received by Royetta Crochet, SBAR reviewed.   Received to unit A&Ox4, oriented to unit, states done with dinner.  Pt states had total 3 loose stool earlier today. Aware to inform staff to monitor and record amount number of stools. Pt on phone sitting at side of bed. Offered fluids, no distress.

## 2018-08-20 NOTE — Progress Notes (Signed)
Mother updated on room change

## 2018-08-21 LAB — MISC. LAB TEST

## 2018-08-21 MED ORDER — ONDANSETRON (PF) 4 MG/2 ML INJECTION
4 mg/2 mL | Freq: Four times a day (QID) | INTRAMUSCULAR | Status: DC
Start: 2018-08-21 — End: 2018-08-26
  Administered 2018-08-21 – 2018-08-26 (×21): via INTRAVENOUS

## 2018-08-21 MED ORDER — PROMETHAZINE IN NS 12.5 MG/50 ML IV PIGGY BAG
12.5 mg/50 ml | Freq: Four times a day (QID) | INTRAVENOUS | Status: DC | PRN
Start: 2018-08-21 — End: 2018-08-26
  Administered 2018-08-22 – 2018-08-25 (×6): via INTRAVENOUS

## 2018-08-21 MED ORDER — BICTEGRAVIR 50 MG-EMTRICITABINE 200 MG-TENOFOVIR ALAFENAM 25 MG TABLET
50-200-25 mg | Freq: Every day | ORAL | Status: DC
Start: 2018-08-21 — End: 2018-08-26
  Administered 2018-08-21 – 2018-08-26 (×6): via ORAL

## 2018-08-21 MED ORDER — OXYCODONE-ACETAMINOPHEN 5 MG-325 MG TAB
5-325 mg | Freq: Four times a day (QID) | ORAL | Status: DC | PRN
Start: 2018-08-21 — End: 2018-08-26
  Administered 2018-08-21 – 2018-08-26 (×10): via ORAL

## 2018-08-21 MED FILL — SODIUM BICARBONATE 650 MG TAB: 650 mg | ORAL | Qty: 2

## 2018-08-21 MED FILL — ALBENDAZOLE 200 MG TAB: 200 mg | ORAL | Qty: 2

## 2018-08-21 MED FILL — METRONIDAZOLE IN SODIUM CHLORIDE (ISO-OSM) 500 MG/100 ML IV PIGGY BACK: 500 mg/100 mL | INTRAVENOUS | Qty: 100

## 2018-08-21 MED FILL — LOVENOX 30 MG/0.3 ML SUB-Q SYRINGE: 30 mg/0.3 mL | SUBCUTANEOUS | Qty: 0.3

## 2018-08-21 MED FILL — DICYCLOMINE 10 MG CAP: 10 mg | ORAL | Qty: 1

## 2018-08-21 MED FILL — BIKTARVY 50 MG-200 MG-25 MG TABLET: 50-200-25 mg | ORAL | Qty: 1

## 2018-08-21 MED FILL — TRIMETHOPRIM-SULFAMETHOXAZOLE 160 MG-800 MG TAB: 160-800 mg | ORAL | Qty: 1

## 2018-08-21 MED FILL — FLUCONAZOLE IN SALINE (ISO-OSMOTIC) 200 MG/100 ML IV PIGGY BACK: 200 mg/100 mL | INTRAVENOUS | Qty: 100

## 2018-08-21 MED FILL — ONDANSETRON (PF) 4 MG/2 ML INJECTION: 4 mg/2 mL | INTRAMUSCULAR | Qty: 2

## 2018-08-21 MED FILL — OXYCODONE-ACETAMINOPHEN 5 MG-325 MG TAB: 5-325 mg | ORAL | Qty: 1

## 2018-08-21 NOTE — Other (Signed)
Bedside shift change report given to Rosanne RN (oncoming nurse) by Amee RN (offgoing nurse). Report included the following information SBAR, Kardex, Procedure Summary, Intake/Output, MAR and Recent Results.

## 2018-08-21 NOTE — Progress Notes (Signed)
SW consult for UAI Package    UAI Package completed  UAI Package in [employer]/[place] [employer]  DMAS 96 signed by physician  DMAS 97 signed by patient and/or representative  Approval/Denial letter completed and mailed  UAI package faxed to [facility]  UAI package offered to patient

## 2018-08-21 NOTE — Progress Notes (Signed)
Patient meets severe malnutrition criteria. If in agreement, please document in Problem List.    NUTRITION RECOMMENDATIONS:   ? Continue current diet and ONS order  ? Order Fibercel BID (provides 9 kcal, 5 g soluble fiber each).  ? Rec MVI/min and thiamine supplementation.  ? Monitor lytes and replete prn.   ? Daily wts to trend.         NUTRITION FOLLOW UP    Current diet order:   DIET DENTAL SOFT (SOFT SOLID)  DIET NUTRITIONAL SUPPLEMENTS All Meals; Ensure Pudding    Current intake: _0  N/A- NPO    _1  very poor     _2  poor      _3  fair      _4  good    Pt reports appetite is slightly picking up. Ate all of breakfast and dinner yesterday. States eating slow.    Pertinent Meds: sodium bicarb, D5 1/2 NaCl w/ KCl @ 75 ml/hr    Pertinent Labs: 6/25: K 3.3 (receiving repletion)    Weight:   Last 3 Recorded Weights in this Encounter    08/18/18 2147 08/19/18 0248 08/20/18 2040   Weight: 61.2 kg (135 lb) 61.6 kg (135 lb 12.9 oz) 62 kg (136 lb 11 oz)       BMI: Body mass index is 20.18 kg/m??.    Physical assessment:  ?? Chewing/swallowing issues:   None reported  ?? GI symptoms:    Reports having continued n/v/d   Last Bowel Movement Date: 08/20/18   Stool Appearance: Loose   Abdominal Assessment: Nausea, Passing flatus   Bowel Sounds: Active   ?? Fluid retention:   none     ?? Skin integrity:   intact         Nutrition diagnosis:   1. Severe Protein Calorie Malnutrition in context of chronic illness related to suboptimal protein-energy intake 2/2 constant n/v/d and newly dx HIV as evidenced by weight loss of 35 lb (20.5%) x 2-3 months, </=50% of estimated energy requirements for >/=1 month. - continues    Assessment of Current MNT: Diet seems appropriate, adequate to meet nutrition needs if PO intake >75% with meals. Oral supplements to increase protein-calorie intake opportunity.    Nutrition recommendation:   ? Continue current diet and ONS order  ? Order Fibercel BID (provides 9 kcal, 5 g soluble fiber each).   ? Rec MVI/min and thiamine supplementation.  ? Monitor lytes and replete prn.   ? Daily wts to trend.     Monitoring and evaluation: PO intake, diet tolerance and compliance, wt trend, nutrition-related labs, hydration, s/sx of new skin concerns, medical changes; will follow up per policy.    Nutrition goals: Pt to meet/tolerate >75% of estimated needs, maintain weight throughout LOS, nutrition-related labs WNL, maintain skin integrity.    Nutrition level of care: _5  low     _6  moderate     _7  High    Progress towards nutrition goals: _8   Met/Ongoing     _9   Progressing appropriately       _10   Progressing slowly      _11   Not Progressing    Code status: Full Code    Discharge Placement: Home with home health  Comfort Care: Yes    Norton Blizzard, [place], RD  08/21/18  Pager # [phone]

## 2018-08-21 NOTE — Progress Notes (Signed)
Pain is controlled at this time. No signs of distress noted. Will continue to monitor patient throughout the shift.

## 2018-08-21 NOTE — Progress Notes (Signed)
Bedside and Verbal shift change report given to Amee Rn (oncoming nurse) by Kimberly Rn (offgoing nurse). Report included the following information SBAR, Kardex and MAR.

## 2018-08-21 NOTE — Progress Notes (Signed)
PAGER ID: [phone]   MESSAGE: [phone] Travis [name] wants to know if he can get Percocet for abdominal pain. thanks....Amee [phone]

## 2018-08-21 NOTE — Progress Notes (Addendum)
INFECTIOUS DISEASE PROGRESS NOTE       Requested by: Dr Sandie AnoSimoes    Reason for consult: N/V/D in immunocopromised host    Date of admission: 08/18/2018        ABX:      Current abx Prior abx    Albendazole 400 mg bid  6/24   2    X 7 d      Fluconazole 6/24                          2  nitazoxinide 6/13    11d  Flagyl 250 mg po bid                   2   x 7 d      ASSESSMENT:        N/V/D/ dehydration    Uncontrolled parasitic infection of GI ( see below) likely-     Giardia should have responded but 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 in HIV- reconstitution of immune system with ART will help immensely     start ART as genomic testing reviewed-see below      initiate albendazol/flagyl  For rx of recalcitrant giardia-see below    Oral candidiasis and suspect esophagitis- improving      cryptosporidia/ Giardia concomitant infection   source unknown     D 11 of nitazoxinide PTA (see above)     6/24 now neg for giardia and persistent + cryptosporidia    Severe/constant nausea       HIV- new diagnosis    VL > 1 mil    cd4  17 abs/ 2%    hiv genomic testing for resistance sent 6/15- still IP     Once available, start ART   ^^ genomic resistance testing sent 6/15- head of lab- Britta MccreedyBarbara shields called reference lab and was told will be done by 7/3-- She asked for lab to be  expeditied      6/26 result available- No resistance detected            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    ho neutropenia with adequate anc                nkda            RECOMMENDATIONS:      Began albendazol 400 mg po bid plus flagyl 250 mg IV bid x 7 d   On 6/24  Severe nausea- wil dc flagyl now as  Giardia neg and attempt to complete albendazole      Expect cryptosporidia to resolve with immune reconstitution   may need lomotil to control diarrhea while this is being accomplished-      Rehydration per IM  Agree with fluconazole- improving   Hold azithro for now- if no nausea, restart next week    pcp prophylaxis w/ bactrim          HIV testing + HIV An, pcr,      cd4  2%; 17 absolute   , genomic testing for resistance  6/15- no resistance detected  Begin Biktarvy-- appreciate pharm assist         continue hydration- per IM  Pt to document stool frequency for accuracy  DW PT, Mother, dr simoes and Dr Thedore MinsSingh            MICROBIOLOGY:       PTA  5/27  Neg c diff  6/2    Stool culture neg  6/12  + for BOTH giardia and cryptosporidia-- lab repeated test with same results    Currrent  6/24  Stool neg CDI           Stool cullture IP        cc   persistant nausea- worse since flagyl    Discussed results of genomic testing and biktarvy.  Pt committed to getting better  ??  Past medical history:     Past Medical History:   Diagnosis Date   ??? Asthma    ??? Hypertension    ??? Neurological disorder     Bell's palsy       History reviewed. No pertinent surgical history.         Current Medications:     Current Facility-Administered Medications   Medication Dose Route Frequency   ??? bictegrav-emtricit-tenofov ala (BIKTARVY) 50-200-25 mg tablet 1 Tab  1 Tab Oral DAILY   ??? ondansetron (ZOFRAN) injection 4 mg  4 mg IntraVENous Q6H   ??? promethazine (PHENERGAN) 12.5 mg in NS 50 mL IVPB  12.5 mg IntraVENous Q6H PRN   ??? oxyCODONE-acetaminophen (PERCOCET) 5-325 mg per tablet 1 Tab  1 Tab Oral Q6H PRN   ??? dextrose 5% - 0.45% NaCl with KCl 40 mEq/L infusion   IntraVENous CONTINUOUS   ??? acetaminophen (TYLENOL) tablet 650 mg  650 mg Oral Q6H PRN   ??? albuterol-ipratropium (DUO-NEB) 2.5 MG-0.5 MG/3 ML  3 mL Nebulization Q6H PRN   ??? dicyclomine (BENTYL) capsule 10 mg  10 mg Oral Q6H PRN   ??? naloxone (NARCAN) injection 0.1 mg  0.1 mg IntraVENous PRN   ??? enoxaparin (LOVENOX) injection 30 mg  30 mg SubCUTAneous Q24H   ??? fluconazole (DIFLUCAN) 200mg /100 mL IVPB (premix)  200 mg IntraVENous Q24H   ??? sodium bicarbonate tablet 1,300 mg  1,300 mg Oral TID    ??? albendazole (ALBENZA) tablet 400 mg  400 mg Oral BID WITH MEALS   ??? trimethoprim-sulfamethoxazole (BACTRIM DS, SEPTRA DS) 160-800 mg per tablet 1 Tab  1 Tab Oral Q MON, WED & FRI         Physical Exam:  Vitals  Temp (24hrs), Avg:98.1 ??F (36.7 ??C), Min:97.7 ??F (36.5 ??C), Max:98.6 ??F (37 ??C)    Visit Vitals  BP (!) 131/91 (BP 1 Location: Right arm, BP Patient Position: Supine)   Pulse 78   Temp 97.9 ??F (36.6 ??C)   Resp 18   Ht 5\' 9"  (1.753 m)   Wt 62 kg (136 lb 11 oz)   SpO2 100%   BMI 20.18 kg/m??       General: Well-developed, 28 y.o. year-old, male, in no acute distress  HEENT: Normocephalic, anicteric sclerae, Pupils equal, round reactive to light, no oropharyngeal lesions. No sinus tenderness.  Neck: Supple, no lymphadenopathy, masses or thyromegaly  Chest: Symmetrical expansion  Lungs: Clear to auscultation bilaterally, no dullness  Heart: Regular rhythm, no murmur, no rub or gallop, No JVD  Abdomen: Soft, TTP, diffusely. No rebound.non distended, no organomegaly, BS+. Neg guiac of emesis in ED  Musculoskeletal: Normal strength/tone. No edema. No clubbing or cyanosis  CNS: AAOx3. Cranial nerves II-XII intact. Grossly normal.No NR  SKIN: No skin lesion or rash. Dry, warm, intact          Labs: Results:   Chemistry Recent Labs     08/20/18  1557 08/20/18  0514 08/18/18  2200   GLU  --  78 115*  NA  --  139 138   K 3.3* 2.7* 3.3*   CL  --  112* 112*   CO2  --  18* 13*   BUN  --  19 33*   CREA  --  0.9 1.6*   CA  --  7.8* 9.1   AGAP  --  9 13   AP  --   --  89   TP  --   --  9.7*   ALB  --   --  3.5      CBC w/Diff Recent Labs     08/20/18  0514 08/18/18  2200   WBC 3.2* 4.8   RBC 4.13 5.84*   HGB 11.7* 16.4   HCT 33.6* 46.4   PLT 174 240   GRANS  --  63.0   LYMPH  --  21.0*   EOS  --  5.2*      Microbiology No results for input(s): CULT in the last 72 hours.       Imaging-    Results from Hospital Encounter encounter on 08/18/18   XR CHEST SNGL V    Narrative INDICATION:  SOB       EXAMINATION:  XR CHEST SNGL V     COMPARISON:  02/14/2016    FINDINGS:  The study shows a normal sized heart.. The lungs are clear and well expanded.           Impression IMPRESSION:  Normal chest         Results from Hospital Encounter encounter on 08/06/18   CT ABD PELV WO CONT    Narrative CT Abdomen and Pelvis without    Indication: Abdominal pain. Nausea, vomiting, diarrhea.    Comparison: 07/22/2018.    TECHNIQUE:   CT of the abdomen and pelvis WITHOUT intravenous contrast. Coronal and sagittal  reformations were obtained. Oral contrast was given.    All CT exams at this facility use one or more dose reduction techniques  including automatic exposure control, mA/kV adjustment per patient's size, or  iterative reconstruction technique. DICOM format imaged data is available to  non-affiliated external healthcare facilities or entities on a secure, media  free, reciprocal searchable basis with patient authorization for 12 months  following the date of the study.    DISCUSSION:    ABSENCE OF INTRAVENOUS CONTRAST DECREASES SENSITIVITY FOR DETECTION OF FOCAL  LESIONS AND VASCULAR PATHOLOGY.    LOWER THORAX: Normal.    HEPATOBILIARY: No focal hepatic lesions.  No biliary ductal dilatation.  SPLEEN: No splenomegaly.  PANCREAS: No focal masses or ductal dilatation.    ADRENALS: No adrenal nodules.  KIDNEYS/URETERS: No hydronephrosis, stones, or solid mass lesions.  PELVIC ORGANS/BLADDER: Unremarkable.    PERITONEUM / RETROPERITONEUM: No free air or fluid.  LYMPH NODES: No lymphadenopathy.  VESSELS: Unremarkable.    GI TRACT: Multiple nondistended fluid-filled loops of small bowel with  questionable mild wall thickening. Fluid-filled colon with no significant wall  thickening. No obstruction. Normal appendix.    BONES AND SOFT TISSUES: No acute abnormality.      Impression IMPRESSION:    Numerous fluid-filled loops of small and large bowel with borderline diffuse  small bowel wall thickening, concerning for infectious or inflammatory   enterocolitis. Findings have slightly progressed since 07/22/2018.         ---------------------------------------------------------------------------------------------------------------  I have independently examined the patient and reviewed all lab studies and imgaing as well as review of nursing notes and physican notes  32 min spent with > 50% of time spent counseling and coordination of care  Alfonso Ellis, MD  08/21/2018    [facility]  [phone]

## 2018-08-21 NOTE — Progress Notes (Signed)
INTERNAL MEDICINE PROGRESS NOTE    Date of note:      August 21, 2018    Patient:               Travis [name], 28 y.o., male  Admit Date:        08/18/2018  Length of Stay:  2 day(s)    Problem List:   Patient Active Problem List   Diagnosis Code   ??? AKI (acute kidney injury) (HCC) N17.9   ??? Dehydration E86.0   ??? Enterocolitis K52.9   ??? Intractable nausea and vomiting R11.2   ??? Diarrhea R19.7   ??? Metabolic acidosis E87.2   ??? HIV (human immunodeficiency virus infection) (HCC) B20       Subjective + interval history     Feels miserable. Had 6-7 bowel movements. Lot of nausea. One episode of vomiting this morning. Poor appetite. Mild abdominal cramping    Assessment:   ??  1. Nausea, vomiting, dehydration  2. 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 Acute kidney injury present on admission  4. Metabolic acidosis???could be due to severe diarrhea or renal tubular acidosis  5. Oral candidiasis  6. 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 Severe protein calorie malnutrition  ??  ??  Plan:   ??  Continue [name] hydration and potassium replacement  ??  Urine anion gap is negative.  Most likely metabolic acidosis is from GI losses.  Continue sodium bicarb tablet.  ??  AKI has resolved.  Monitor closely    Today's labs are pending  ??  Patient continues to have diarrhea.  Regimen switched from nitazoxanide to albendazole plus Flagyl.  ID is on board.   It seems like genomic testing result is back. Patient will be started on Biktarvy today. Hopefully once his CD4 count improves, he will be able to get rid of diarrhea  ????  Continue [name] fluconazole for oropharyngeal candidiasis  ??  Bactrim 3 times a week for PCP prophylaxis  ??  Lovenox for DVT prophylaxis    Continue nutritional supplements    Change ondansetron to every 6 hours scheduled  ??  Discussed with Dr. Glynda JaegerMooney.    ??  Also discussed at length with patient's mom at bedside  ??  ??  - Code status: Full code  ??  Recommend to continue hospitalization.   Expected date of discharge: TBD  Plan for disposition - tbd  ??    Objective:       Visit Vitals  BP (!) 136/92 (BP Patient Position: Supine)   Pulse 80   Temp 97.8 ??F (36.6 ??C)   Resp 18   Ht 5\' 9"  (1.753 m)   Wt 62 kg (136 lb 11 oz)   SpO2 100%   BMI 20.18 kg/m??             Intake/Output Summary (Last 24 hours) at 08/21/2018 0915  Last data filed at 08/21/2018 0600  Gross per 24 hour   Intake 1924 ml   Output 250 ml   Net 1674 ml       Physical Exam:     ??  GEN - AAOx3,??anxious, cachectic  HEENT - mucous membranes??moist; oral candidiasis is improving  Neck - supple, no JVD  Cardiac - RRR, S1, S2, no murmurs  Chest/Lungs - clear to auscultation without wheezes or rhonchi  Abdomen - soft,??mild tenderness without guarding or rigidity  Extremities - no clubbing/ cyanosis/ edema  Neuro - CN 2-12 intact.  No focal deficits. No motor or sensory deficit appreciated.   Skin - no rashes or lesions    Current medications:       Current Facility-Administered Medications:   ???  dextrose 5% - 0.45% NaCl with KCl 40 mEq/L infusion, , IntraVENous, CONTINUOUS, Audryana Hockenberry S, MD, Last Rate: 75 mL/hr at 08/20/18 2140  ???  ondansetron (ZOFRAN) injection 4 mg, 4 mg, IntraVENous, Q6H PRN, Keenan Dimitrov S, MD, 4 mg at 08/21/18 0541  ???  acetaminophen (TYLENOL) tablet 650 mg, 650 mg, Oral, Q6H PRN, Holden Maniscalco S, MD  ???  albuterol-ipratropium (DUO-NEB) 2.5 MG-0.5 MG/3 ML, 3 mL, Nebulization, Q6H PRN, Mollee Neer S, MD  ???  dicyclomine (BENTYL) capsule 10 mg, 10 mg, Oral, Q6H PRN, Ojani Berenson S, MD, 10 mg at 08/20/18 2330  ???  naloxone (NARCAN) injection 0.1 mg, 0.1 mg, IntraVENous, PRN, Cienna Dumais, Reina Fuseuchita S, MD  ???  enoxaparin (LOVENOX) injection 30 mg, 30 mg, SubCUTAneous, Q24H, Akai Dollard S, MD, 30 mg at 08/20/18 1138  ???  fluconazole (DIFLUCAN) 200mg /100 mL IVPB (premix), 200 mg, IntraVENous, Q24H, Jaivian Battaglini S, MD, Last Rate: 100 mL/hr at 08/20/18 1038, 200 mg at 08/20/18 1038   ???  sodium bicarbonate tablet 1,300 mg, 1,300 mg, Oral, TID, Siddalee Vanderheiden S, MD, 1,300 mg at 08/21/18 0838  ???  albendazole (ALBENZA) tablet 400 mg, 400 mg, Oral, BID WITH MEALS, Alfonso EllisMooney, Martha, MD, 400 mg at 08/20/18 1645  ???  metroNIDAZOLE 250 mg  in sodium chloride IVPB, 250 mg, IntraVENous, Q12H, Alfonso EllisMooney, Martha, MD, Last Rate: 100 mL/hr at 08/21/18 0541, 250 mg at 08/21/18 0541  ???  trimethoprim-sulfamethoxazole (BACTRIM DS, SEPTRA DS) 160-800 mg per tablet 1 Tab, 1 Tab, Oral, Q MON, WED & Gita KudoFRI, Mooney, Martha, MD, 1 Tab at 08/21/18 [id number]    Labs:     Recent Results (from the past 24 hour(s))   POTASSIUM    Collection Time: 08/20/18  3:57 PM   Result Value Ref Range    Potassium 3.3 (L) 3.5 - 5.1 mEq/L       XR Results:  Results from Hospital Encounter encounter on 08/18/18   XR CHEST SNGL V    Narrative INDICATION:  SOB       EXAMINATION:  XR CHEST SNGL V    COMPARISON:  02/14/2016    FINDINGS:  The study shows a normal sized heart.. The lungs are clear and well expanded.           Impression IMPRESSION:  Normal chest         CT Results:  Results from Hospital Encounter encounter on 08/06/18   CT ABD PELV WO CONT    Narrative CT Abdomen and Pelvis without    Indication: Abdominal pain. Nausea, vomiting, diarrhea.    Comparison: 07/22/2018.    TECHNIQUE:   CT of the abdomen and pelvis WITHOUT intravenous contrast. Coronal and sagittal  reformations were obtained. Oral contrast was given.    All CT exams at this facility use one or more dose reduction techniques  including automatic exposure control, mA/kV adjustment per patient's size, or  iterative reconstruction technique. DICOM format imaged data is available to  non-affiliated external healthcare facilities or entities on a secure, media  free, reciprocal searchable basis with patient authorization for 12 months  following the date of the study.    DISCUSSION:    ABSENCE OF INTRAVENOUS CONTRAST DECREASES SENSITIVITY FOR DETECTION OF FOCAL   LESIONS AND VASCULAR PATHOLOGY.    LOWER  THORAX: Normal.    HEPATOBILIARY: No focal hepatic lesions.  No biliary ductal dilatation.  SPLEEN: No splenomegaly.  PANCREAS: No focal masses or ductal dilatation.    ADRENALS: No adrenal nodules.  KIDNEYS/URETERS: No hydronephrosis, stones, or solid mass lesions.  PELVIC ORGANS/BLADDER: Unremarkable.    PERITONEUM / RETROPERITONEUM: No free air or fluid.  LYMPH NODES: No lymphadenopathy.  VESSELS: Unremarkable.    GI TRACT: Multiple nondistended fluid-filled loops of small bowel with  questionable mild wall thickening. Fluid-filled colon with no significant wall  thickening. No obstruction. Normal appendix.    BONES AND SOFT TISSUES: No acute abnormality.      Impression IMPRESSION:    Numerous fluid-filled loops of small and large bowel with borderline diffuse  small bowel wall thickening, concerning for infectious or inflammatory  enterocolitis. Findings have slightly progressed since 07/22/2018.         MRI Results:  No results found for this or any previous visit.    Nuclear Medicine Results:  No results found for this or any previous visit.    [place] Results:  Results from [place] encounter on 07/21/18   [place] RUQ    Narrative Clinical history: Elevated liver function studies    EXAMINATION:  Upper quadrant ultrasound 07/28/2018    FINDINGS:  Liver demonstrates homogeneous echotexture. No intrahepatic bile duct  dilatation. Bile duct measures 4 mm. No stones are seen in the gallbladder.  Gallbladder wall measures 2 mm. Sonographic Percell Miller sign is negative.    Pancreas, proximal aorta and IVC are within normal limits. Right kidney measures  10.4 x 5.6 x 3.7 cm. Normal corticomedullary differentiation. No hydronephrosis.  Small amounts of perihepatic ascites.      Impression IMPRESSION:  Small amounts of perihepatic ascites.         IR Results:  No results found for this or any previous visit.    VAS/US Results:  No results found for this or any previous visit.       Total clinical care time was 40  minutes of which more than 50% was spent in coordination of care and counseling (time spent with patient/family face to face, physical exam, reviewing laboratory and imaging investigations, speaking with physicians and nursing staff involved in this patient's care).     Perfecto Kingdom,  MD  [facility]  August 21, 2018  Time: 9:15 AM

## 2018-08-21 NOTE — Progress Notes (Signed)
Problem: Falls - Risk of  Goal: *Absence of Falls  Description: Document Schmid Fall Risk and appropriate interventions in the flowsheet.  Outcome: Progressing Towards Goal  Note: Fall Risk Interventions:  Mobility Interventions: Patient to call before getting OOB         Medication Interventions: Patient to call before getting OOB    Elimination Interventions: Call light in reach    History of Falls Interventions: Bed/chair exit alarm, Evaluate medications/consider consulting pharmacy

## 2018-08-21 NOTE — Progress Notes (Signed)
Progress  Notes by Alfonso Ellis, MD at 08/21/18 1643                Author: Alfonso Ellis, MD  Service: Infectious Disease  Author Type: Physician       Filed: 08/21/18 1657  Date of Service: 08/21/18 1643  Status: Addendum          Editor: Alfonso Ellis, MD (Physician)          Related Notes: Original Note by Alfonso Ellis, MD (Physician) filed at 08/21/18 1654                                                                                       INFECTIOUS DISEASE PROGRESS NOTE          Requested by: Dr Sandie Ano      Reason for consult: N/V/D in immunocopromised host      Date of admission: 08/18/2018              ABX:            Current abx  Prior abx      Albendazole 400 mg bid  6/24   2    X 7 d         Fluconazole 6/24                          2   nitazoxinide 6/13    11d   Flagyl 250 mg po bid                   2   x 7 d           ASSESSMENT:             N/V/D/ dehydration     Uncontrolled parasitic infection of GI ( see below) likely-      Giardia should have responded but 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 in HIV- reconstitution of immune system with ART will help immensely      start ART as genomic testing reviewed-see below       initiate albendazol/flagyl  For rx of recalcitrant giardia-see below      Oral candidiasis and suspect esophagitis- improving         cryptosporidia/ Giardia concomitant infection    source unknown      D 11 of nitazoxinide PTA (see above)      6/24 now neg for giardia and persistent + cryptosporidia     Severe/constant nausea            HIV- new diagnosis     VL > 1 mil     cd4  17 abs/ 2%     hiv genomic testing for resistance sent 6/15- still IP      Once available, start ART    ^^ genomic resistance testing sent 6/15- head of lab- Britta Mccreedy shields called reference lab and was told will be done by 7/3-- She asked for lab to be  expeditied       6/26 result available- No resistance detected  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      ho neutropenia with  adequate anc                        nkda                   RECOMMENDATIONS:         Began albendazol 400 mg po bid plus flagyl 250 mg IV bid x 7 d   On 6/24   Severe nausea- wil dc flagyl now as  Giardia neg and attempt to complete albendazole         Expect cryptosporidia to resolve with immune reconstitution    may need lomotil to control diarrhea while this is being accomplished-         Rehydration per IM   Agree with fluconazole- improving   Hold azithro for now- if no nausea, restart next week     pcp prophylaxis w/ bactrim               HIV testing + HIV An, pcr,       cd4  2%; 17 absolute    , genomic testing for resistance  6/15- no resistance detected   Begin Biktarvy-- appreciate pharm assist            continue hydration- per IM   Pt to document stool frequency for accuracy   DW PT, Mother, dr simoes and Dr Thedore MinsSingh               MICROBIOLOGY:          PTA   5/27  Neg c diff   6/2    Stool culture neg   6/12  + for BOTH giardia and cryptosporidia-- lab repeated test with same results      Currrent   6/24  Stool neg CDI            Stool cullture IP              cc     persistant nausea- worse since flagyl      Discussed results of genomic testing and biktarvy.  Pt committed to getting better   ??     Past medical history:          Past Medical History:        Diagnosis  Date         ?  Asthma       ?  Hypertension       ?  Neurological disorder            Bell's palsy           History reviewed. No pertinent surgical history.               Current Medications:          Current Facility-Administered Medications          Medication  Dose  Route  Frequency           ?  bictegrav-emtricit-tenofov ala (BIKTARVY) 50-200-25 mg tablet 1 Tab   1 Tab  Oral  DAILY     ?  ondansetron (ZOFRAN) injection 4 mg   4 mg  IntraVENous  Q6H     ?  promethazine (PHENERGAN) 12.5 mg in NS 50 mL IVPB   12.5 mg  IntraVENous  Q6H PRN     ?  oxyCODONE-acetaminophen (PERCOCET) 5-325 mg per  tablet 1 Tab   1 Tab  Oral  Q6H PRN     ?   dextrose 5% - 0.45% NaCl with KCl 40 mEq/L infusion     IntraVENous  CONTINUOUS     ?  acetaminophen (TYLENOL) tablet 650 mg   650 mg  Oral  Q6H PRN     ?  albuterol-ipratropium (DUO-NEB) 2.5 MG-0.5 MG/3 ML   3 mL  Nebulization  Q6H PRN     ?  dicyclomine (BENTYL) capsule 10 mg   10 mg  Oral  Q6H PRN     ?  naloxone (NARCAN) injection 0.1 mg   0.1 mg  IntraVENous  PRN           ?  enoxaparin (LOVENOX) injection 30 mg   30 mg  SubCUTAneous  Q24H           ?  fluconazole (DIFLUCAN) 200mg /100 mL IVPB (premix)   200 mg  IntraVENous  Q24H     ?  sodium bicarbonate tablet 1,300 mg   1,300 mg  Oral  TID     ?  albendazole (ALBENZA) tablet 400 mg   400 mg  Oral  BID WITH MEALS           ?  trimethoprim-sulfamethoxazole (BACTRIM DS, SEPTRA DS) 160-800 mg per tablet 1 Tab   1 Tab  Oral  Q MON, WED & FRI              Physical Exam:   Vitals   Temp (24hrs), Avg:98.1 ??F (36.7 ??C), Min:97.7 ??F (36.5 ??C), Max:98.6 ??F (37 ??C)      Visit Vitals      BP  (!) 131/91 (BP 1 Location: Right arm, BP Patient Position: Supine)     Pulse  78     Temp  97.9 ??F (36.6 ??C)     Resp  18     Ht  5\' 9"  (1.753 m)     Wt  62 kg (136 lb 11 oz)     SpO2  100%        BMI  20.18 kg/m??           General: Well-developed, 28 y.o. year-old, male , in no acute distress   HEENT: Normocephalic, anicteric sclerae, Pupils equal, round reactive to light, no oropharyngeal lesions. No sinus tenderness.   Neck: Supple, no lymphadenopathy, masses or thyromegaly   Chest: Symmetrical expansion   Lungs: Clear to auscultation bilaterally, no dullness   Heart: Regular rhythm, no murmur, no rub or gallop, No JVD   Abdomen: Soft, TTP, diffusely. No rebound.non distended, no organomegaly, BS+. Neg guiac of emesis in ED   Musculoskeletal: Normal strength/tone. No edema. No clubbing or cyanosis   CNS: AAOx3. Cranial nerves II-XII intact. Grossly normal.No NR   SKIN: No skin lesion or rash. Dry, warm, intact                 Labs:  Results:        Chemistry  Recent Labs          08/20/18   1557  08/20/18   0514  08/18/18   2200      GLU   --   78  115*      NA   --   139  138      K  3.3*  2.7*  3.3*      CL   --   112*  112*  CO2   --   18*  13*      BUN   --   19  33*      CREA   --   0.9  1.6*      CA   --   7.8*  9.1      AGAP   --   9  13      AP   --    --   89      TP   --    --   9.7*      ALB   --    --   3.5                 CBC w/Diff  Recent Labs         08/20/18   0514  08/18/18   2200      WBC  3.2*  4.8      RBC  4.13  5.84*      HGB  11.7*  16.4      HCT  33.6*  46.4      PLT  174  240      GRANS   --   63.0      LYMPH   --   21.0*      EOS   --   5.2*                 Microbiology  No results for input(s): CULT in the last 72 hours.           Imaging-        Results from Hospital Encounter encounter on 08/18/18     XR CHEST SNGL V           Narrative  INDICATION:   SOB         EXAMINATION:   XR CHEST SNGL V      COMPARISON:   02/14/2016      FINDINGS:   The study shows a normal sized heart.. The lungs are clear and well expanded.                    Impression  IMPRESSION:   Normal chest                Results from Hospital Encounter encounter on 08/06/18     CT ABD PELV WO CONT           Narrative  CT Abdomen and Pelvis without      Indication: Abdominal pain. Nausea, vomiting, diarrhea.      Comparison: 07/22/2018.      TECHNIQUE:    CT of the abdomen and pelvis WITHOUT intravenous contrast. Coronal and sagittal   reformations were obtained. Oral contrast was given.      All CT exams at this facility use one or more dose reduction techniques   including automatic exposure control, mA/kV adjustment per patient's size, or   iterative reconstruction technique. DICOM format imaged data is available to   non-affiliated external healthcare facilities or entities on a secure, media   free, reciprocal searchable basis with patient authorization for 12 months   following the date of the study.      DISCUSSION:      ABSENCE OF INTRAVENOUS CONTRAST DECREASES SENSITIVITY FOR  DETECTION OF FOCAL   LESIONS AND VASCULAR PATHOLOGY.      LOWER THORAX: Normal.      HEPATOBILIARY: No focal  hepatic lesions.  No biliary ductal dilatation.   SPLEEN: No splenomegaly.   PANCREAS: No focal masses or ductal dilatation.      ADRENALS: No adrenal nodules.   KIDNEYS/URETERS: No hydronephrosis, stones, or solid mass lesions.   PELVIC ORGANS/BLADDER: Unremarkable.      PERITONEUM / RETROPERITONEUM: No free air or fluid.   LYMPH NODES: No lymphadenopathy.   VESSELS: Unremarkable.      GI TRACT: Multiple nondistended fluid-filled loops of small bowel with   questionable mild wall thickening. Fluid-filled colon with no significant wall   thickening. No obstruction. Normal appendix.      BONES AND SOFT TISSUES: No acute abnormality.              Impression  IMPRESSION:      Numerous fluid-filled loops of small and large bowel with borderline diffuse   small bowel wall thickening, concerning for infectious or inflammatory   enterocolitis. Findings have slightly progressed since 07/22/2018.              ---------------------------------------------------------------------------------------------------------------   I have independently examined the patient and reviewed all lab studies and imgaing as well as review of nursing notes and physican notes       32 min spent with > 50% of time spent counseling and coordination of care   Alfonso EllisMartha Kellen Hover, MD   08/21/2018      [facility]   [phone]

## 2018-08-21 NOTE — Progress Notes (Signed)
 Patient meets severe malnutrition criteria. If in agreement, please document in Problem List.    NUTRITION RECOMMENDATIONS:   . Continue current diet and ONS order  . Order Fibercel BID (provides 9 kcal, 5 g soluble fiber each).  . Rec MVI/min and thiamine supplementation.  . Monitor lytes and replete prn.   . Daily wts to trend.         NUTRITION FOLLOW UP    Current diet order:   DIET DENTAL SOFT (SOFT SOLID)  DIET NUTRITIONAL SUPPLEMENTS All Meals; Ensure Pudding    Current intake: []  N/A- NPO    []  very poor     []  poor      []  fair      []  good    Pt reports appetite is slightly picking up. Ate all of breakfast and dinner yesterday. States eating slow.    Pertinent Meds: sodium bicarb, D5 1/2 NaCl w/ KCl @ 75 ml/hr    Pertinent Labs: 6/25: K 3.3 (receiving repletion)    Weight:   Last 3 Recorded Weights in this Encounter    08/18/18 2147 08/19/18 0248 08/20/18 2040   Weight: 61.2 kg (135 lb) 61.6 kg (135 lb 12.9 oz) 62 kg (136 lb 11 oz)       BMI: Body mass index is 20.18 kg/m.    Physical assessment:   Chewing/swallowing issues:   None reported   GI symptoms:    Reports having continued n/v/d   Last Bowel Movement Date: 08/20/18   Stool Appearance: Loose   Abdominal Assessment: Nausea, Passing flatus   Bowel Sounds: Active    Fluid retention:   none      Skin integrity:   intact         Nutrition diagnosis:   1. Severe Protein Calorie Malnutrition in context of chronic illness related to suboptimal protein-energy intake 2/2 constant n/v/d and newly dx HIV as evidenced by weight loss of 35 lb (20.5%) x 2-3 months, </=50% of estimated energy requirements for >/=1 month. - continues    Assessment of Current MNT: Diet seems appropriate, adequate to meet nutrition needs if PO intake >75% with meals. Oral supplements to increase protein-calorie intake opportunity.    Nutrition recommendation:   . Continue current diet and ONS order  . Order Fibercel BID (provides 9 kcal, 5 g soluble fiber each).  . Rec MVI/min  and thiamine supplementation.  . Monitor lytes and replete prn.   . Daily wts to trend.     Monitoring and evaluation: PO intake, diet tolerance and compliance, wt trend, nutrition-related labs, hydration, s/sx of new skin concerns, medical changes; will follow up per policy.    Nutrition goals: Pt to meet/tolerate >75% of estimated needs, maintain weight throughout LOS, nutrition-related labs WNL, maintain skin integrity.    Nutrition level of care: []  low     [x]  moderate     []  High    Progress towards nutrition goals: []   Met/Ongoing     []   Progressing appropriately       [x]   Progressing slowly      []   Not Progressing    Code status: Full Code    Discharge Placement: Home with home health  Comfort Care: Yes    Duwaine Levin, MS, RD  08/21/18  Pager # [phone]

## 2018-08-21 NOTE — Progress Notes (Signed)
Progress Notes by Salley SlaughterSimoes, Lilliah Priego S, MD at 08/21/18 0915                Author: Salley SlaughterSimoes, Demontray Franta S, MD  Service: Hospitalist  Author Type: Physician       Filed: 08/21/18 1437  Date of Service: 08/21/18 0915  Status: Signed          Editor: Salley SlaughterSimoes, Lynia Landry S, MD (Physician)                          INTERNAL MEDICINE PROGRESS NOTE      Date of note:      August 21, 2018      Patient:               Travis [name],  28 y.o., male   Admit Date:        08/18/2018   Length of Stay:  2 day(s)      Problem List:      Patient Active Problem List        Diagnosis  Code         ?  AKI (acute kidney injury) (HCC)  N17.9     ?  Dehydration  E86.0     ?  Enterocolitis  K52.9     ?  Intractable nausea and vomiting  R11.2     ?  Diarrhea  R19.7     ?  Metabolic acidosis  E87.2         ?  HIV (human immunodeficiency virus infection) (HCC)  B20             Subjective + interval history        Feels miserable. Had 6-7 bowel movements. Lot of nausea. One episode of vomiting this morning. Poor appetite. Mild abdominal cramping        Assessment:     ??   1.  Nausea, vomiting, dehydration   2.  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  Acute kidney injury present on admission   4.  Metabolic acidosis-could be due to severe diarrhea or renal tubular acidosis   5.  Oral candidiasis   6.  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  Severe protein calorie malnutrition   ??   ??     Plan:     ??   Continue [name] hydration and potassium replacement   ??   Urine anion gap is negative.  Most likely metabolic acidosis is from GI losses.  Continue sodium bicarb tablet.   ??   AKI has resolved.  Monitor closely      Today's labs are pending   ??   Patient continues to have diarrhea.  Regimen switched from nitazoxanide to albendazole plus Flagyl.  ID is on board.    It seems like genomic testing result is back. Patient will be started on Biktarvy today. Hopefully once his CD4 count improves, he will be able to get rid of diarrhea   ????    Continue [name] fluconazole for oropharyngeal candidiasis   ??   Bactrim 3 times a week for PCP prophylaxis   ??   Lovenox for DVT prophylaxis      Continue nutritional supplements      Change ondansetron to every 6 hours scheduled   ??   Discussed with Dr. Glynda JaegerMooney.     ??   Also discussed at length with patient's mom at bedside   ??   ??   -  Code status: Full code   ??   Recommend to continue hospitalization.   Expected date of discharge: TBD   Plan for disposition - tbd   ??        Objective:           Visit Vitals      BP  (!) 136/92 (BP Patient Position: Supine)     Pulse  80     Temp  97.8 ??F (36.6 ??C)     Resp  18     Ht  5\' 9"  (1.753 m)     Wt  62 kg (136 lb 11 oz)     SpO2  100%        BMI  20.18 kg/m??                    Intake/Output Summary (Last 24 hours) at 08/21/2018 0915   Last data filed at 08/21/2018 0600     Gross per 24 hour        Intake  1924 ml        Output  250 ml        Net  1674 ml             Physical Exam:        ??   GEN - AAOx3,??anxious, cachectic   HEENT - mucous membranes??moist; oral candidiasis is improving   Neck - supple, no JVD   Cardiac - RRR, S1, S2, no murmurs   Chest/Lungs - clear to auscultation without wheezes or rhonchi   Abdomen - soft,??mild tenderness without guarding or rigidity   Extremities - no clubbing/ cyanosis/ edema   Neuro - CN 2-12 intact. No focal deficits. No motor or sensory deficit appreciated.    Skin - no rashes or lesions        Current medications:           Current Facility-Administered Medications:    ?  dextrose 5% - 0.45% NaCl with KCl 40 mEq/L infusion, , IntraVENous, CONTINUOUS, Shavaun Osterloh S, MD, Last Rate: 75 mL/hr at 08/20/18 2140   ?  ondansetron (ZOFRAN) injection 4 mg, 4 mg, IntraVENous, Q6H PRN, Deaisa Merida S, MD, 4 mg at 08/21/18 0541   ?  acetaminophen (TYLENOL) tablet 650 mg, 650 mg, Oral, Q6H PRN, Zelina Jimerson S, MD   ?  albuterol-ipratropium (DUO-NEB) 2.5 MG-0.5 MG/3 ML, 3 mL, Nebulization, Q6H PRN, Adamariz Gillott S, MD   ?  dicyclomine  (BENTYL) capsule 10 mg, 10 mg, Oral, Q6H PRN, Ayonna Speranza S, MD, 10 mg at 08/20/18 2330   ?  naloxone (NARCAN) injection 0.1 mg, 0.1 mg, IntraVENous, PRN, Ceasar Decandia S, MD   ?  enoxaparin (LOVENOX) injection 30 mg, 30 mg, SubCUTAneous, Q24H, Diera Wirkkala S, MD, 30 mg at 08/20/18 1138   ?  fluconazole (DIFLUCAN) 200mg /100 mL IVPB (premix), 200 mg, IntraVENous, Q24H, Toby Breithaupt S, MD, Last Rate: 100 mL/hr at 08/20/18 1038, 200 mg at 08/20/18 1038   ?  sodium bicarbonate tablet 1,300 mg, 1,300 mg, Oral, TID, Dhiren Azimi S, MD, 1,300 mg at 08/21/18 [id number]   ?  albendazole [facility](ALBENZA) tablet 400 mg, 400 mg, Oral, BID WITH MEALS, Alfonso EllisMooney, Martha, MD, 400 mg at 08/20/18 1645   ?  metroNIDAZOLE 250 mg  in sodium chloride IVPB, 250 mg, IntraVENous, Q12H, Alfonso EllisMooney, Martha, MD, Last Rate: 100 mL/hr at 08/21/18 0541, 250 mg at 08/21/18 0541   ?  trimethoprim-sulfamethoxazole (BACTRIM DS, SEPTRA DS)  160-800 mg per tablet 1 Tab, 1 Tab, Oral, Q MON, WED & Christin Bach, MD, 1 Tab at 08/21/18 4403        Labs:          Recent Results (from the past 24 hour(s))     POTASSIUM          Collection Time: 08/20/18  3:57 PM         Result  Value  Ref Range            Potassium  3.3 (L)  3.5 - 5.1 mEq/L           XR Results:     Results from Hospital Encounter encounter on 08/18/18     XR CHEST SNGL V           Narrative  INDICATION:   SOB         EXAMINATION:   XR CHEST SNGL V      COMPARISON:   02/14/2016      FINDINGS:   The study shows a normal sized heart.. The lungs are clear and well expanded.                    Impression  IMPRESSION:   Normal chest              CT Results:     Results from Hospital Encounter encounter on 08/06/18     CT ABD PELV WO CONT           Narrative  CT Abdomen and Pelvis without      Indication: Abdominal pain. Nausea, vomiting, diarrhea.      Comparison: 07/22/2018.      TECHNIQUE:    CT of the abdomen and pelvis WITHOUT intravenous contrast. Coronal and sagittal   reformations were  obtained. Oral contrast was given.      All CT exams at this facility use one or more dose reduction techniques   including automatic exposure control, mA/kV adjustment per patient's size, or   iterative reconstruction technique. DICOM format imaged data is available to   non-affiliated external healthcare facilities or entities on a secure, media   free, reciprocal searchable basis with patient authorization for 12 months   following the date of the study.      DISCUSSION:      ABSENCE OF INTRAVENOUS CONTRAST DECREASES SENSITIVITY FOR DETECTION OF FOCAL   LESIONS AND VASCULAR PATHOLOGY.      LOWER THORAX: Normal.      HEPATOBILIARY: No focal hepatic lesions.  No biliary ductal dilatation.   SPLEEN: No splenomegaly.   PANCREAS: No focal masses or ductal dilatation.      ADRENALS: No adrenal nodules.   KIDNEYS/URETERS: No hydronephrosis, stones, or solid mass lesions.   PELVIC ORGANS/BLADDER: Unremarkable.      PERITONEUM / RETROPERITONEUM: No free air or fluid.   LYMPH NODES: No lymphadenopathy.   VESSELS: Unremarkable.      GI TRACT: Multiple nondistended fluid-filled loops of small bowel with   questionable mild wall thickening. Fluid-filled colon with no significant wall   thickening. No obstruction. Normal appendix.      BONES AND SOFT TISSUES: No acute abnormality.              Impression  IMPRESSION:      Numerous fluid-filled loops of small and large bowel with borderline diffuse   small bowel wall thickening, concerning for infectious or inflammatory   enterocolitis. Findings have slightly  progressed since 07/22/2018.              MRI Results:   No results found for this or any previous visit.      Nuclear Medicine Results:   No results found for this or any previous visit.      US Results:     Results from Hospital Encounter encounter on 07/21/18     US RUQ           Narrative  Clinical history: Elevated liver function studies      EXAMINATION:   Upper quadrant ultrasound 07/28/2018      FINDINGS:   Liver  demonstrates homogeneous echotexture. No intrahepatic bile duct   dilatation. Bile duct measures 4 mm. No stones are seen in the gallbladder.   Gallbladder wall measures 2 mm. Sonographic Eulah PontMurphy sign is negative.      Pancreas, proximal aorta and IVC are within normal limits. Right kidney measures   10.4 x 5.6 x 3.7 cm. Normal corticomedullary differentiation. No hydronephrosis.   Small amounts of perihepatic ascites.              Impression  IMPRESSION:   Small amounts of perihepatic ascites.              IR Results:   No results found for this or any previous visit.      VAS/US Results:   No results found for this or any previous visit.         Total clinical care time was 40  minutes of which more than 50% was spent in coordination of care and counseling (time spent with patient/family face to face, physical exam, reviewing laboratory and imaging investigations, speaking with physicians and  nursing staff involved in this patient's care).       Tia MaskerUCHITA Alister Staver,  MD   [facility]   [facility]   August 21, 2018   Time: 9:15 AM

## 2018-08-21 NOTE — Progress Notes (Signed)
PAGER ID: [phone]   MESSAGE: [phone] Travis [name] wants to know if he can get Percocet for abdominal pain. thanks.Marland KitchenMarland KitchenMarland KitchenAmee [phone]

## 2018-08-22 LAB — CBC
Hematocrit: 35.8 % — ABNORMAL LOW (ref 37.0–50.0)
Hemoglobin: 12.3 gm/dl — ABNORMAL LOW (ref 12.4–17.2)
MCH: 27.6 pg (ref 23.0–34.6)
MCHC: 34.4 gm/dl (ref 30.0–36.0)
MCV: 80.3 fL (ref 80.0–98.0)
MPV: 12.2 fL — ABNORMAL HIGH (ref 6.0–10.0)
Platelets: 185 10*3/uL (ref 140–450)
RBC: 4.46 M/uL (ref 3.80–5.70)
RDW-SD: 43.5 (ref 35.1–43.9)
WBC: 3.2 10*3/uL — ABNORMAL LOW (ref 4.0–11.0)

## 2018-08-22 LAB — BASIC METABOLIC PANEL
Anion Gap: 6 mmol/L (ref 5–15)
BUN: 11 mg/dl (ref 7–25)
CO2: 18 mEq/L — ABNORMAL LOW (ref 21–32)
Calcium: 7.3 mg/dl — ABNORMAL LOW (ref 8.5–10.1)
Chloride: 119 mEq/L — ABNORMAL HIGH (ref 98–107)
Creatinine: 0.9 mg/dl (ref 0.6–1.3)
EGFR IF NonAfrican American: >60
GFR African American: >60
Glucose: 88 mg/dl (ref 74–106)
Potassium: 3.8 mEq/L (ref 3.5–5.1)
Sodium: 142 mEq/L (ref 136–145)

## 2018-08-22 LAB — METABOLIC PANEL, BASIC
Anion gap: 6 mmol/L (ref 5–15)
BUN: 11 mg/dl (ref 7–25)
CO2: 18 mEq/L — ABNORMAL LOW (ref 21–32)
Calcium: 7.3 mg/dl — ABNORMAL LOW (ref 8.5–10.1)
Chloride: 119 mEq/L — ABNORMAL HIGH (ref 98–107)
Creatinine: 0.9 mg/dl (ref 0.6–1.3)
GFR est AA: >60
GFR est non-AA: >60
Glucose: 88 mg/dl (ref 74–106)
Potassium: 3.8 mEq/L (ref 3.5–5.1)
Sodium: 142 mEq/L (ref 136–145)

## 2018-08-22 LAB — CBC W/O DIFF
HCT: 35.8 % — ABNORMAL LOW (ref 37.0–50.0)
HGB: 12.3 gm/dl — ABNORMAL LOW (ref 12.4–17.2)
MCH: 27.6 pg (ref 23.0–34.6)
MCHC: 34.4 gm/dl (ref 30.0–36.0)
MCV: 80.3 fL (ref 80.0–98.0)
MPV: 12.2 fL — ABNORMAL HIGH (ref 6.0–10.0)
PLATELET: 185 10*3/uL (ref 140–450)
RBC: 4.46 M/uL (ref 3.80–5.70)
RDW-SD: 43.5 (ref 35.1–43.9)
WBC: 3.2 10*3/uL — ABNORMAL LOW (ref 4.0–11.0)

## 2018-08-22 MED ORDER — DIPHENOXYLATE-ATROPINE 2.5 MG-0.025 MG TAB
Freq: Two times a day (BID) | ORAL | Status: DC
Start: 2018-08-22 — End: 2018-08-22

## 2018-08-22 MED ORDER — DIPHENOXYLATE-ATROPINE 2.5 MG-0.025 MG TAB
Freq: Two times a day (BID) | ORAL | Status: AC
Start: 2018-08-22 — End: 2018-08-23
  Administered 2018-08-22 – 2018-08-23 (×2): via ORAL

## 2018-08-22 MED FILL — TRIMETHOPRIM-SULFAMETHOXAZOLE 160 MG-800 MG TAB: 160-800 mg | ORAL | Qty: 1

## 2018-08-22 MED FILL — SODIUM BICARBONATE 650 MG TAB: 650 mg | ORAL | Qty: 2

## 2018-08-22 MED FILL — OXYCODONE-ACETAMINOPHEN 5 MG-325 MG TAB: 5-325 mg | ORAL | Qty: 1

## 2018-08-22 MED FILL — ONDANSETRON (PF) 4 MG/2 ML INJECTION: 4 mg/2 mL | INTRAMUSCULAR | Qty: 2

## 2018-08-22 MED FILL — BIKTARVY 50 MG-200 MG-25 MG TABLET: 50-200-25 mg | ORAL | Qty: 1

## 2018-08-22 MED FILL — DIPHENOXYLATE-ATROPINE 2.5 MG-0.025 MG TAB: ORAL | Qty: 1

## 2018-08-22 MED FILL — ALBENDAZOLE 200 MG TAB: 200 mg | ORAL | Qty: 2

## 2018-08-22 MED FILL — FLUCONAZOLE IN SALINE (ISO-OSMOTIC) 200 MG/100 ML IV PIGGY BACK: 200 mg/100 mL | INTRAVENOUS | Qty: 100

## 2018-08-22 MED FILL — PROMETHAZINE IN NS 12.5 MG/50 ML IV PIGGY BAG: 12.5 mg/50 ml | INTRAVENOUS | Qty: 50

## 2018-08-22 MED FILL — LOVENOX 30 MG/0.3 ML SUB-Q SYRINGE: 30 mg/0.3 mL | SUBCUTANEOUS | Qty: 0.3

## 2018-08-22 NOTE — Progress Notes (Signed)
INTERNAL MEDICINE PROGRESS NOTE    Date of note:      August 22, 2018    Patient:               Travis [name], 28 y.o., male  Admit Date:        08/18/2018  Length of Stay:  3 day(s)    Problem List:   Patient Active Problem List   Diagnosis Code   ??? AKI (acute kidney injury) (HCC) N17.9   ??? Dehydration E86.0   ??? Enterocolitis K52.9   ??? Intractable nausea and vomiting R11.2   ??? Diarrhea R19.7   ??? Metabolic acidosis E87.2   ??? HIV (human immunodeficiency virus infection) (HCC) B20       Subjective + interval history     Had 8 bowel movements yesterday, 2 this morning.  Patient feels that his diarrhea is a little bit better today.  His appetite is still horrible.  Nausea is slightly better with the current antiemetic regimen.    Assessment:   ??  1. Nausea, vomiting, dehydration  2. 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 Acute kidney injury present on admission  4. Metabolic acidosis???urine anion gap negative.  Likely metabolic acidosis from GI losses.  5. Oral candidiasis  6. 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 Severe protein calorie malnutrition  ??  ??  Plan:   ??  Continue [name] hydration and potassium replacement  ??   ??Continue sodium bicarb tablet.  ??  AKI has resolved. ??Monitor closely  ????  Flagyl was started on 6/26 due to severe nausea.  Continue albendazole     It seems like genomic testing result is back.  Patient was started on Biktarvy on 6/26. Hopefully once his CD4 count improves, he will be able to get rid of diarrhea  ????  Continue??[name] fluconazole for oropharyngeal candidiasis  ??  Bactrim 3 times a week for PCP prophylaxis    Hopefully can resume azithromycin prophylaxis soon once nausea improves  ??  Lovenox for DVT prophylaxis  ??  Continue nutritional supplements    Continue current antiemetic regimen.    Discussed at length with patient's mom on phone - [phone]  ??  - Code status:??Full code  ??  Recommend to continue hospitalization.  Expected date of discharge:??TBD   Plan for disposition -??tbd  ??      Objective:       Visit Vitals  BP 125/84 (BP 1 Location: Right arm, BP Patient Position: Supine)   Pulse 80   Temp 97.7 ??F (36.5 ??C)   Resp 16   Ht 5\' 9"  (1.753 m)   Wt 66.4 kg (146 lb 6.2 oz)   SpO2 100%   BMI 21.62 kg/m??             Intake/Output Summary (Last 24 hours) at 08/22/2018 0913  Last data filed at 08/22/2018 0646  Gross per 24 hour   Intake 2357.5 ml   Output 100 ml   Net 2257.5 ml       Physical Exam:     ??  GEN - AAOx3, comfortable, cachectic  HEENT - mucous membranes??moist; oral candidiasis is improving  Neck - supple, no JVD  Cardiac - RRR, S1, S2, no murmurs  Chest/Lungs - clear to auscultation without wheezes or rhonchi  Abdomen - soft,??mild tenderness without guarding or rigidity  Extremities - no clubbing/ cyanosis/ edema  Neuro - CN 2-12 intact. No focal deficits. No motor or sensory  deficit appreciated.   Skin - no rashes or lesions    Current medications:       Current Facility-Administered Medications:   ???  bictegrav-emtricit-tenofov ala (BIKTARVY) 50-200-25 mg tablet 1 Tab, 1 Tab, Oral, DAILY, Jearld Adjutant, MD, 1 Tab at 08/21/18 1427  ???  ondansetron (ZOFRAN) injection 4 mg, 4 mg, IntraVENous, Q6H, Jina Olenick S, MD, 4 mg at 08/22/18 0555  ???  promethazine (PHENERGAN) 12.5 mg in NS 50 mL IVPB, 12.5 mg, IntraVENous, Q6H PRN, Tarry Blayney S, MD, Last Rate: 200 mL/hr at 08/22/18 0822, 12.5 mg at 08/22/18 3295  ???  oxyCODONE-acetaminophen (PERCOCET) 5-325 mg per tablet 1 Tab, 1 Tab, Oral, Q6H PRN, Nevaen Tredway S, MD, 1 Tab at 08/22/18 0555  ???  dextrose 5% - 0.45% NaCl with KCl 40 mEq/L infusion, , IntraVENous, CONTINUOUS, Lynk Marti S, MD, Last Rate: 75 mL/hr at 08/22/18 0600  ???  acetaminophen (TYLENOL) tablet 650 mg, 650 mg, Oral, Q6H PRN, Rainn Bullinger S, MD  ???  albuterol-ipratropium (DUO-NEB) 2.5 MG-0.5 MG/3 ML, 3 mL, Nebulization, Q6H PRN, Abrish Erny S, MD  ???  dicyclomine (BENTYL) capsule 10 mg, 10 mg, Oral, Q6H PRN, Wynonia Medero,  Mikail Goostree S, MD, 10 mg at 08/21/18 1205  ???  naloxone (NARCAN) injection 0.1 mg, 0.1 mg, IntraVENous, PRN, Iverson Sees, Legrand Como, MD  ???  enoxaparin (LOVENOX) injection 30 mg, 30 mg, SubCUTAneous, Q24H, Evangelyne Loja S, MD, 30 mg at 08/20/18 1138  ???  fluconazole (DIFLUCAN) 200mg /100 mL IVPB (premix), 200 mg, IntraVENous, Q24H, Velinda Wrobel S, MD, Last Rate: 100 mL/hr at 08/21/18 0948, 200 mg at 08/21/18 0948  ???  sodium bicarbonate tablet 1,300 mg, 1,300 mg, Oral, TID, Delvina Mizzell S, MD, 1,300 mg at 08/21/18 2214  ???  albendazole (ALBENZA) tablet 400 mg, 400 mg, Oral, BID WITH MEALS, Jearld Adjutant, MD, 400 mg at 08/21/18 1753  ???  trimethoprim-sulfamethoxazole (BACTRIM DS, SEPTRA DS) 160-800 mg per tablet 1 Tab, 1 Tab, Oral, Q MON, WED & Christin Bach, MD, 1 Tab at 08/21/18 1884    Labs:     Recent Results (from the past 24 hour(s))   CBC W/O DIFF    Collection Time: 08/22/18  5:50 AM   Result Value Ref Range    WBC 3.2 (L) 4.0 - 11.0 1000/mm3    RBC 4.46 3.80 - 5.70 M/uL    HGB 12.3 (L) 12.4 - 17.2 gm/dl    HCT 35.8 (L) 37.0 - 50.0 %    MCV 80.3 80.0 - 98.0 fL    MCH 27.6 23.0 - 34.6 pg    MCHC 34.4 30.0 - 36.0 gm/dl    PLATELET 185 140 - 450 1000/mm3    MPV 12.2 (H) 6.0 - 10.0 fL    RDW-SD 43.5 35.1 - 16.6     METABOLIC PANEL, BASIC    Collection Time: 08/22/18  5:50 AM   Result Value Ref Range    Sodium 142 136 - 145 mEq/L    Potassium 3.8 3.5 - 5.1 mEq/L    Chloride 119 (H) 98 - 107 mEq/L    CO2 18 (L) 21 - 32 mEq/L    Glucose 88 74 - 106 mg/dl    BUN 11 7 - 25 mg/dl    Creatinine 0.9 0.6 - 1.3 mg/dl    GFR est AA >60      GFR est non-AA >60      Calcium 7.3 (L) 8.5 - 10.1 mg/dl    Anion gap 6  5 - 15 mmol/L       XR Results:  Results from Hospital Encounter encounter on 08/18/18   XR CHEST SNGL V    Narrative INDICATION:  SOB       EXAMINATION:  XR CHEST SNGL V    COMPARISON:  02/14/2016    FINDINGS:  The study shows a normal sized heart.. The lungs are clear and well expanded.            Impression IMPRESSION:  Normal chest         CT Results:  Results from Hospital Encounter encounter on 08/06/18   CT ABD PELV WO CONT    Narrative CT Abdomen and Pelvis without    Indication: Abdominal pain. Nausea, vomiting, diarrhea.    Comparison: 07/22/2018.    TECHNIQUE:   CT of the abdomen and pelvis WITHOUT intravenous contrast. Coronal and sagittal  reformations were obtained. Oral contrast was given.    All CT exams at this facility use one or more dose reduction techniques  including automatic exposure control, mA/kV adjustment per patient's size, or  iterative reconstruction technique. DICOM format imaged data is available to  non-affiliated external healthcare facilities or entities on a secure, media  free, reciprocal searchable basis with patient authorization for 12 months  following the date of the study.    DISCUSSION:    ABSENCE OF INTRAVENOUS CONTRAST DECREASES SENSITIVITY FOR DETECTION OF FOCAL  LESIONS AND VASCULAR PATHOLOGY.    LOWER THORAX: Normal.    HEPATOBILIARY: No focal hepatic lesions.  No biliary ductal dilatation.  SPLEEN: No splenomegaly.  PANCREAS: No focal masses or ductal dilatation.    ADRENALS: No adrenal nodules.  KIDNEYS/URETERS: No hydronephrosis, stones, or solid mass lesions.  PELVIC ORGANS/BLADDER: Unremarkable.    PERITONEUM / RETROPERITONEUM: No free air or fluid.  LYMPH NODES: No lymphadenopathy.  VESSELS: Unremarkable.    GI TRACT: Multiple nondistended fluid-filled loops of small bowel with  questionable mild wall thickening. Fluid-filled colon with no significant wall  thickening. No obstruction. Normal appendix.    BONES AND SOFT TISSUES: No acute abnormality.      Impression IMPRESSION:    Numerous fluid-filled loops of small and large bowel with borderline diffuse  small bowel wall thickening, concerning for infectious or inflammatory  enterocolitis. Findings have slightly progressed since 07/22/2018.         MRI Results:   No results found for this or any previous visit.    Nuclear Medicine Results:  No results found for this or any previous visit.    US Results:  Results from Hospital Encounter encounter on 07/21/18   US RUQ    Narrative Clinical history: Elevated liver function studies    EXAMINATION:  Upper quadrant ultrasound 07/28/2018    FINDINGS:  Liver demonstrates homogeneous echotexture. No intrahepatic bile duct  dilatation. Bile duct measures 4 mm. No stones are seen in the gallbladder.  Gallbladder wall measures 2 mm. Sonographic Eulah PontMurphy sign is negative.    Pancreas, proximal aorta and IVC are within normal limits. Right kidney measures  10.4 x 5.6 x 3.7 cm. Normal corticomedullary differentiation. No hydronephrosis.  Small amounts of perihepatic ascites.      Impression IMPRESSION:  Small amounts of perihepatic ascites.         IR Results:  No results found for this or any previous visit.    VAS/US Results:  No results found for this or any previous visit.      Total clinical care time was  36 minutes of which more than 50% was spent in coordination of care and counseling (time spent with patient/family face to face, physical exam, reviewing laboratory and imaging investigations, speaking with physicians and nursing staff involved in this patient's care).     Tia MaskerUCHITA Bani Gianfrancesco,  MD  [facility]  [facility]  August 22, 2018  Time: 9:13 AM

## 2018-08-22 NOTE — Other (Deleted)
PHYSICIAN???S DOCUMENTATION REQUEST   This Form is Not a Permanent Document in the Medical Record         By submitting this query, we are merely seeking further clarification of documentation to accurately reflect all conditions that you are monitoring, evaluating, treating or that extend the hospitalization or utilize additional resources of care. Please utilize your independent clinical judgment when addressing the question(s) below.     Dear Dr. Ninetta Lights,    Documentation by Hospitalist on 08/21/18 in patient with Nausea, vomiting, dehydration; diarrhea due to Giardia and Cryptosporidium, AKI, Likely metabolic acidosis from GI losses, Oral candidiasis, Recently diagnosed HIV AIDS???CD4 count 17 and Severe protein calorie malnutrition is described as cachectic.     Treatment: Diet advanced to soft solids. Ensure Pudding, Recommended MVI, mineral and thiamine supplementation. Monitor electrolytes and replete prn. Daily wts to trend.   ............................................................................................................................................     Please clarify the diagnosis of cachexia:    ? Cachexia due to malnutrition  ? Cachexia associated with HIV infection (AIDS)   ? Cachexia due to other (please specify)  ? Unable to determine     If there is an associated diagnosis, please specify if present on admission (POA).  ?? Yes  ?? No  ?? Clinically Undetermined     PLEASE DOCUMENT ANY ADDITIONAL DIAGNOSES AND/OR SPECIFICITY IN THE PROGRESS NOTES AND/OR DISCHARGE SUMMARY.     Thank you,  Dennie Bible, BSN, RN-BC, [place] Documentation Specialist   Office:  [phone]

## 2018-08-22 NOTE — Progress Notes (Addendum)
INFECTIOUS DISEASE PROGRESS NOTE       Requested by: Dr Sandie AnoSimoes    Reason for consult: N/V/D in immunocopromised host    Date of admission: 08/18/2018    Will follow back on Monday, I am on call this weekend, please call us at [phone] for any questions or concerns.     ABX:      Current abx Prior abx    Fluconazole 6/24                          3  Bactrim prophylaxis 6/26  biktarvy started 6/26   nitazoxinide 6/13    11d  Flagyl 250 mg po bid                   2   x 7 d  Albendazole 400 mg bid  6/24   3       ASSESSMENT:        N/V/D/ dehydration    Uncontrolled parasitic infection of GI ( see below) likely-     Giardia should have responded but 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 in HIV- reconstitution of immune system with ART will help immensely     start ART as genomic testing reviewed-see below      initiate albendazol/flagyl  For rx of recalcitrant giardia-see below    Oral candidiasis and suspect esophagitis- improving      cryptosporidia/ Giardia concomitant infection   source unknown     D 11 of nitazoxinide PTA (see above)     6/24 now neg for giardia and persistent + cryptosporidia    Severe/constant nausea        HIV- new diagnosis    VL > 1 mil    cd4  17 abs/ 2%    hiv genomic testing for resistance sent 6/15- no resistance [ results available 6/26 ]  Started Biktarvy 6/26       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    ho neutropenia with adequate anc    nkda            RECOMMENDATIONS:      D/c albendazol 400 mg po bid as no improvement on it  Severe nausea- hence flagyl d/ced and repeat giardia ag neg     -Expect cryptosporidia to resolve with immune reconstitution - started on Biktarvy 6/26 as genotype neg for any resistance  -Start lomotil for diarrhea control till his immune system gets better  -D/w Dr Sandie AnoSimoes, if his diarrhea is better on lomotil, can use 2-3 times a  day prn to control it as likely will take weeks before his CD4 count will come up. Can gone home on lomotil prn once diarrhea better controlled.  - also recommend high fiber , low fat , frequent small meals. Avoid caffeine and lactose [milk product]    - Rehydration per IM  - Agree with fluconazole- cont till CD4 above atleast 100, ideally recommended till CD4 around 200  - Hold azithro for now- if no nausea, restart next week  - cont pcp prophylaxis w/ bactrim  - Cont Biktarvy  - f/up EVMS ID post discharge              MICROBIOLOGY:       PTA  5/27  Neg c diff  6/2    Stool culture neg  6/12  + for BOTH giardia and cryptosporidia-- lab repeated test with same results  Currrent  6/24  Stool neg CDI           Stool cullture IP           Crypto ag positive           Giardia ag neg     cc   Pt c/o still diarrhea but says seems slightly better, had 6-7 times yesterday and 3 times since morning, says the quantity has decreased. Nausea still present but says that its after seeing particular food. He is c/o lovenox shot, does not want the.  ??  Current Medications:     Current Facility-Administered Medications   Medication Dose Route Frequency   ??? diphenoxylate-atropine (LOMOTIL) tablet 1 Tab  1 Tab Oral Q12H   ??? bictegrav-emtricit-tenofov ala (BIKTARVY) 50-200-25 mg tablet 1 Tab  1 Tab Oral DAILY   ??? ondansetron (ZOFRAN) injection 4 mg  4 mg IntraVENous Q6H   ??? promethazine (PHENERGAN) 12.5 mg in NS 50 mL IVPB  12.5 mg IntraVENous Q6H PRN   ??? oxyCODONE-acetaminophen (PERCOCET) 5-325 mg per tablet 1 Tab  1 Tab Oral Q6H PRN   ??? dextrose 5% - 0.45% NaCl with KCl 40 mEq/L infusion   IntraVENous CONTINUOUS   ??? acetaminophen (TYLENOL) tablet 650 mg  650 mg Oral Q6H PRN   ??? albuterol-ipratropium (DUO-NEB) 2.5 MG-0.5 MG/3 ML  3 mL Nebulization Q6H PRN   ??? dicyclomine (BENTYL) capsule 10 mg  10 mg Oral Q6H PRN   ??? naloxone (NARCAN) injection 0.1 mg  0.1 mg IntraVENous PRN    ??? enoxaparin (LOVENOX) injection 30 mg  30 mg SubCUTAneous Q24H   ??? fluconazole (DIFLUCAN) 200mg /100 mL IVPB (premix)  200 mg IntraVENous Q24H   ??? sodium bicarbonate tablet 1,300 mg  1,300 mg Oral TID   ??? albendazole (ALBENZA) tablet 400 mg  400 mg Oral BID WITH MEALS   ??? trimethoprim-sulfamethoxazole (BACTRIM DS, SEPTRA DS) 160-800 mg per tablet 1 Tab  1 Tab Oral Q MON, WED & FRI         Physical Exam:  Vitals  Temp (24hrs), Avg:97.6 ??F (36.4 ??C), Min:97.3 ??F (36.3 ??C), Max:97.9 ??F (36.6 ??C)    Visit Vitals  BP (!) 135/91 (BP 1 Location: Right arm, BP Patient Position: Supine)   Pulse 76   Temp 97.5 ??F (36.4 ??C)   Resp 18   Ht 5\' 9"  (1.753 m)   Wt 66.4 kg (146 lb 6.2 oz)   SpO2 100%   BMI 21.62 kg/m??       General: Well-developed, 28 y.o. year-old, male, in no acute distress  HEENT: Normocephalic, anicteric sclerae, Pupils equal, round reactive to light, no oropharyngeal lesions. Thrush improved  Neck: Supple, no lymphadenopathy, masses or thyromegaly  Chest: Symmetrical expansion  Lungs: Clear to auscultation bilaterally, no dullness  Heart: Regular rhythm, no murmur, no rub or gallop, No JVD  Abdomen: Soft, TTP, diffusely. No rebound.non distended, no organomegaly, BS+.  Musculoskeletal: Normal strength/tone. No edema. No clubbing or cyanosis  CNS: AAOx3. Cranial nerves II-XII intact. Grossly normal.No NR  SKIN: No skin lesion or rash. Dry, warm, intact          Labs: Results:   Chemistry Recent Labs     08/22/18  0550 08/20/18  1557 08/20/18  0514   GLU 88  --  78   NA 142  --  139   K 3.8 3.3* 2.7*   CL 119*  --  112*   CO2 18*  --  18*  BUN 11  --  19   CREA 0.9  --  0.9   CA 7.3*  --  7.8*   AGAP 6  --  9      CBC w/Diff Recent Labs     08/22/18  0550 08/20/18  0514   WBC 3.2* 3.2*   RBC 4.46 4.13   HGB 12.3* 11.7*   HCT 35.8* 33.6*   PLT 185 174      Microbiology No results for input(s): CULT in the last 72 hours.       Imaging-    Results from Hospital Encounter encounter on 08/18/18   XR CHEST SNGL V     Narrative INDICATION:  SOB       EXAMINATION:  XR CHEST SNGL V    COMPARISON:  02/14/2016    FINDINGS:  The study shows a normal sized heart.. The lungs are clear and well expanded.           Impression IMPRESSION:  Normal chest         Results from Hospital Encounter encounter on 08/06/18   CT ABD PELV WO CONT    Narrative CT Abdomen and Pelvis without    Indication: Abdominal pain. Nausea, vomiting, diarrhea.    Comparison: 07/22/2018.    TECHNIQUE:   CT of the abdomen and pelvis WITHOUT intravenous contrast. Coronal and sagittal  reformations were obtained. Oral contrast was given.    All CT exams at this facility use one or more dose reduction techniques  including automatic exposure control, mA/kV adjustment per patient's size, or  iterative reconstruction technique. DICOM format imaged data is available to  non-affiliated external healthcare facilities or entities on a secure, media  free, reciprocal searchable basis with patient authorization for 12 months  following the date of the study.    DISCUSSION:    ABSENCE OF INTRAVENOUS CONTRAST DECREASES SENSITIVITY FOR DETECTION OF FOCAL  LESIONS AND VASCULAR PATHOLOGY.    LOWER THORAX: Normal.    HEPATOBILIARY: No focal hepatic lesions.  No biliary ductal dilatation.  SPLEEN: No splenomegaly.  PANCREAS: No focal masses or ductal dilatation.    ADRENALS: No adrenal nodules.  KIDNEYS/URETERS: No hydronephrosis, stones, or solid mass lesions.  PELVIC ORGANS/BLADDER: Unremarkable.    PERITONEUM / RETROPERITONEUM: No free air or fluid.  LYMPH NODES: No lymphadenopathy.  VESSELS: Unremarkable.    GI TRACT: Multiple nondistended fluid-filled loops of small bowel with  questionable mild wall thickening. Fluid-filled colon with no significant wall  thickening. No obstruction. Normal appendix.    BONES AND SOFT TISSUES: No acute abnormality.      Impression IMPRESSION:    Numerous fluid-filled loops of small and large bowel with borderline diffuse   small bowel wall thickening, concerning for infectious or inflammatory  enterocolitis. Findings have slightly progressed since 07/22/2018.         ---------------------------------------------------------------------------------------------------------------  I have independently examined the patient and reviewed all lab studies and imgaing as well as review of nursing notes and physican notes     33 min spent with > 50% of time spent counseling and coordination of care    Radford PaxSushma Ori Trejos, MD  08/22/2018    [facility]  [phone]

## 2018-08-22 NOTE — Progress Notes (Signed)
Problem: Pain  Goal: *Control of Pain  Outcome: Progressing Towards Goal     Problem: Falls - Risk of  Goal: *Absence of Falls  Description: Document Schmid Fall Risk and appropriate interventions in the flowsheet.  Outcome: Progressing Towards Goal  Note: Fall Risk Interventions:  Mobility Interventions: Communicate number of staff needed for ambulation/transfer, Assess mobility with egress test         Medication Interventions: Patient to call before getting OOB    Elimination Interventions: Call light in reach, Bed/chair exit alarm    History of Falls Interventions: Bed/chair exit alarm, Door open when patient unattended         Problem: Patient Education: Go to Patient Education Activity  Goal: Patient/Family Education  Outcome: Progressing Towards Goal

## 2018-08-22 NOTE — Progress Notes (Signed)
Message sent to dr simones that patient does not plan on ever taking the lovenox

## 2018-08-22 NOTE — Other (Signed)
Received patient reviewed sbar

## 2018-08-22 NOTE — Progress Notes (Signed)
Problem: Pain  Goal: *Control of Pain  Outcome: Progressing Towards Goal     Problem: Falls - Risk of  Goal: *Absence of Falls  Description: Document Schmid Fall Risk and appropriate interventions in the flowsheet.  Outcome: Progressing Towards Goal  Note: Fall Risk Interventions:  Mobility Interventions: Communicate number of staff needed for ambulation/transfer, Assess mobility with egress test         Medication Interventions: Patient to call before getting OOB    Elimination Interventions: Call light in reach, Bed/chair exit alarm    History of Falls Interventions: Bed/chair exit alarm, Door open when patient unattended         Problem: Patient Education: Go to Patient Education Activity  Goal: Patient/Family Education  Outcome: Progressing Towards Goal

## 2018-08-22 NOTE — Progress Notes (Signed)
Progress  Notes by Radford PaxSingh, Marieke Lubke, MD at 08/22/18 1532                Author: Radford PaxSingh, Lavren Lewan, MD  Service: Infectious Disease  Author Type: Physician       Filed: 08/22/18 1604  Date of Service: 08/22/18 1532  Status: Addendum          Editor: Radford PaxSingh, Krisa Blattner, MD (Physician)          Related Notes: Original Note by Radford PaxSingh, Ladonna Vanorder, MD (Physician) filed at 08/22/18 1601                                                                                       INFECTIOUS DISEASE PROGRESS NOTE          Requested by: Dr Sandie AnoSimoes      Reason for consult: N/V/D in immunocopromised host      Date of admission: 08/18/2018      Will follow back on Monday, I am on call this weekend, please call us at [phone] for any questions or concerns.         ABX:            Current abx  Prior abx      Fluconazole 6/24                          3   Bactrim prophylaxis 6/26   biktarvy started 6/26    nitazoxinide 6/13    11d   Flagyl 250 mg po bid                   2   x 7 d   Albendazole 400 mg bid  6/24   3            ASSESSMENT:             N/V/D/ dehydration     Uncontrolled parasitic infection of GI ( see below) likely-      Giardia should have responded but 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 in HIV- reconstitution of immune system with ART will help immensely      start ART as genomic testing reviewed-see below       initiate albendazol/flagyl  For rx of recalcitrant giardia-see below      Oral candidiasis and suspect esophagitis - improving         cryptosporidia/ Giardia concomitant infection    source unknown      D 11 of nitazoxinide PTA (see above)      6/24 now neg for giardia and persistent + cryptosporidia     Severe/constant nausea           HIV- new diagnosis     VL > 1 mil     cd4  17 abs/ 2%     hiv genomic testing for resistance sent 6/15- no resistance [ results available 6/26 ]   Started Biktarvy 6/26          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      ho neutropenia with adequate anc      nkda  RECOMMENDATIONS:         D/c albendazol 400 mg po bid as no improvement on it   Severe nausea- hence flagyl d/ced and repeat giardia ag neg       -Expect cryptosporidia to resolve with immune reconstitution - started on Biktarvy 6/26 as genotype neg for any resistance   -Start lomotil for diarrhea control till his immune system gets better   -D/w Dr Sandie AnoSimoes, if his diarrhea is better on lomotil, can use 2-3 times a day prn to control it as likely will take weeks before his CD4 count will come up. Can gone home on lomotil prn once diarrhea better controlled.   - also recommend high fiber , low fat , frequent small meals. Avoid caffeine and lactose [milk product]      - Rehydration per IM   - Agree with fluconazole- cont till CD4 above atleast 100, ideally recommended till CD4 around 200   - Hold azithro for now- if no nausea, restart next week   - cont pcp prophylaxis w/ bactrim   - Cont Biktarvy   - f/up EVMS ID post discharge                  MICROBIOLOGY:          PTA   5/27  Neg c diff   6/2    Stool culture neg   6/12  + for BOTH giardia and cryptosporidia-- lab repeated test with same results      Currrent   6/24  Stool neg CDI            Stool cullture IP            Crypto ag positive            Giardia ag neg         cc     Pt c/o still diarrhea but says seems slightly better, had 6-7 times yesterday and 3 times since morning, says the quantity has decreased. Nausea still present  but says that its after seeing particular food. He is c/o lovenox shot, does not want the.   ??     Current Medications:          Current Facility-Administered Medications          Medication  Dose  Route  Frequency           ?  diphenoxylate-atropine (LOMOTIL) tablet 1 Tab   1 Tab  Oral  Q12H     ?  bictegrav-emtricit-tenofov ala (BIKTARVY) 50-200-25 mg tablet 1 Tab   1 Tab  Oral  DAILY     ?  ondansetron (ZOFRAN) injection 4 mg   4 mg  IntraVENous  Q6H     ?  promethazine (PHENERGAN) 12.5 mg in NS 50 mL IVPB   12.5 mg  IntraVENous   Q6H PRN     ?  oxyCODONE-acetaminophen (PERCOCET) 5-325 mg per tablet 1 Tab   1 Tab  Oral  Q6H PRN     ?  dextrose 5% - 0.45% NaCl with KCl 40 mEq/L infusion     IntraVENous  CONTINUOUS     ?  acetaminophen (TYLENOL) tablet 650 mg   650 mg  Oral  Q6H PRN     ?  albuterol-ipratropium (DUO-NEB) 2.5 MG-0.5 MG/3 ML   3 mL  Nebulization  Q6H PRN     ?  dicyclomine (BENTYL) capsule 10 mg   10 mg  Oral  Q6H  PRN     ?  naloxone (NARCAN) injection 0.1 mg   0.1 mg  IntraVENous  PRN     ?  enoxaparin (LOVENOX) injection 30 mg   30 mg  SubCUTAneous  Q24H     ?  fluconazole (DIFLUCAN) /100 mL IVPB (premix)   200 mg  IntraVENous  Q24H     ?  sodium bicarbonate tablet 1,300 mg   1,300 mg  Oral  TID     ?  albendazole (ALBENZA) tablet 400 mg   400 mg  Oral  BID WITH MEALS           ?  trimethoprim-sulfamethoxazole (BACTRIM DS, SEPTRA DS) 160-800 mg per tablet 1 Tab   1 Tab  Oral  Q MON, WED & FRI              Physical Exam:   Vitals   Temp (24hrs), Avg:97.6 ??F (36.4 ??C), Min:97.3 ??F (36.3 ??C), Max:97.9 ??F (36.6 ??C)      Visit Vitals      BP  (!) 135/91 (BP 1 Location: Right arm, BP Patient Position: Supine)     Pulse  76     Temp  97.5 ??F (36.4 ??C)     Resp  18     Ht   (1.753 m)     Wt  66.4 kg (146 lb 6.2 oz)     SpO2  100%        BMI  21.62 kg/m??           General: Well-developed, 28 y.o. year-old, male , in no acute distress   HEENT: Normocephalic, anicteric sclerae, Pupils equal, round reactive to light, no oropharyngeal lesions. Thrush improved   Neck: Supple, no lymphadenopathy, masses or thyromegaly   Chest: Symmetrical expansion   Lungs: Clear to auscultation bilaterally, no dullness   Heart: Regular rhythm, no murmur, no rub or gallop, No JVD   Abdomen: Soft, TTP, diffusely. No rebound.non distended, no organomegaly, BS+.   Musculoskeletal: Normal strength/tone. No edema. No clubbing or cyanosis   CNS: AAOx3. Cranial nerves II-XII intact. Grossly normal.No NR   SKIN: No skin lesion or rash. Dry, warm, intact                  Labs:  Results:        Chemistry  Recent Labs         08/22/18   0550  08/20/18   1557  08/20/18   0514      GLU  88   --   78      NA  142   --   139      K  3.8  3.3*  2.7*      CL  119*   --   112*      CO2  18*   --   18*      BUN  11   --   19      CREA  0.9   --   0.9      CA  7.3*   --   7.8*      AGAP  6   --   9              CBC w/Diff  Recent Labs         08/22/18   0550  08/20/18   0514      WBC  3.2*  3.2*  RBC  4.46  4.13      HGB  12.3*  11.7*      HCT  35.8*  33.6*      PLT  185  174              Microbiology  No results for input(s): CULT in the last 72 hours.           Imaging-        Results from Hospital Encounter encounter on 08/18/18     XR CHEST SNGL V           Narrative  INDICATION:   SOB         EXAMINATION:   XR CHEST SNGL V      COMPARISON:   02/14/2016      FINDINGS:   The study shows a normal sized heart.. The lungs are clear and well expanded.                    Impression  IMPRESSION:   Normal chest                Results from Hospital Encounter encounter on 08/06/18     CT ABD PELV WO CONT           Narrative  CT Abdomen and Pelvis without      Indication: Abdominal pain. Nausea, vomiting, diarrhea.      Comparison: 07/22/2018.      TECHNIQUE:    CT of the abdomen and pelvis WITHOUT intravenous contrast. Coronal and sagittal   reformations were obtained. Oral contrast was given.      All CT exams at this facility use one or more dose reduction techniques   including automatic exposure control, mA/kV adjustment per patient's size, or   iterative reconstruction technique. DICOM format imaged data is available to   non-affiliated external healthcare facilities or entities on a secure, media   free, reciprocal searchable basis with patient authorization for 12 months   following the date of the study.      DISCUSSION:      ABSENCE OF INTRAVENOUS CONTRAST DECREASES SENSITIVITY FOR DETECTION OF FOCAL   LESIONS AND VASCULAR PATHOLOGY.      LOWER THORAX: Normal.       HEPATOBILIARY: No focal hepatic lesions.  No biliary ductal dilatation.   SPLEEN: No splenomegaly.   PANCREAS: No focal masses or ductal dilatation.      ADRENALS: No adrenal nodules.   KIDNEYS/URETERS: No hydronephrosis, stones, or solid mass lesions.   PELVIC ORGANS/BLADDER: Unremarkable.      PERITONEUM / RETROPERITONEUM: No free air or fluid.   LYMPH NODES: No lymphadenopathy.   VESSELS: Unremarkable.      GI TRACT: Multiple nondistended fluid-filled loops of small bowel with   questionable mild wall thickening. Fluid-filled colon with no significant wall   thickening. No obstruction. Normal appendix.      BONES AND SOFT TISSUES: No acute abnormality.              Impression  IMPRESSION:      Numerous fluid-filled loops of small and large bowel with borderline diffuse   small bowel wall thickening, concerning for infectious or inflammatory   enterocolitis. Findings have slightly progressed since 07/22/2018.              ---------------------------------------------------------------------------------------------------------------   I have independently examined the patient and reviewed all lab studies and imgaing as well as review of nursing notes and physican notes  33 min spent with > 50% of time spent counseling and coordination of care      Radford Pax, MD   08/22/2018      [facility]   [phone]

## 2018-08-22 NOTE — Progress Notes (Signed)
Message sent to dr simones that patient does not plan on ever taking the lovenox

## 2018-08-22 NOTE — Progress Notes (Signed)
Progress Notes by Salley SlaughterSimoes, Sunil Hue S, MD at 08/22/18 [id number]                Author: Salley SlaughterSimoes, Andra Heslin S, MD  Service: Hospitalist  Author Type: Physician       Filed: 08/22/18 1449  Date of Service: 08/22/18 0913  Status: Signed          Editor: Salley SlaughterSimoes, Fynley Chrystal S, MD (Physician)                          INTERNAL MEDICINE PROGRESS NOTE      Date of note:      August 22, 2018      Patient:               Travis Travis [name],  28 y.o., male   Admit Date:        08/18/2018   Length of Stay:  3 day(s)      Problem List:      Patient Active Problem List        Diagnosis  Code         ?  AKI (acute kidney injury) (HCC)  N17.9     ?  Dehydration  E86.0     ?  Enterocolitis  K52.9     ?  Intractable nausea and vomiting  R11.2     ?  Diarrhea  R19.7     ?  Metabolic acidosis  E87.2         ?  HIV (human immunodeficiency virus infection) (HCC)  B20             Subjective + interval history        Had 8 bowel movements yesterday, 2 this morning.  Patient feels that his diarrhea is a little bit better today.  His appetite is still horrible.  Nausea is slightly better with the current antiemetic regimen.        Assessment:     ??   1.  Nausea, vomiting, dehydration   2.  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  Acute kidney injury present on admission   4.  Metabolic acidosis-urine anion gap negative.  Likely metabolic acidosis from GI losses.   5.  Oral candidiasis   6.  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  Severe protein calorie malnutrition   ??   ??     Plan:     ??   Continue Travis [name] hydration and potassium replacement   ??    ??Continue sodium bicarb tablet.   ??   AKI has resolved. ??Monitor closely   ????   Flagyl was started on 6/26 due to severe nausea.  Continue albendazole       It seems like genomic testing result is back.  Patient was started on Biktarvy on 6/26. Hopefully once his CD4 count improves, he will be able to get rid of diarrhea   ????   Continue??Travis [name] fluconazole for oropharyngeal candidiasis   ??   Bactrim 3  times a week for PCP prophylaxis      Hopefully can resume azithromycin prophylaxis soon once nausea improves   ??   Lovenox for DVT prophylaxis   ??   Continue nutritional supplements      Continue current antiemetic regimen.      Discussed at length with patient's mom on phone - [phone]   ??   - Code status:??Full code   ??  Recommend to continue hospitalization.   Expected date of discharge:??TBD   Plan for disposition -??tbd   ??           Objective:           Visit Vitals      BP  125/84 (BP 1 Location: Right arm, BP Patient Position: Supine)     Pulse  80     Temp  97.7 ??F (36.5 ??C)     Resp  16     Ht  5\' 9"  (1.753 m)     Wt  66.4 kg (146 lb 6.2 oz)     SpO2  100%        BMI  21.62 kg/m??                    Intake/Output Summary (Last 24 hours) at 08/22/2018 0913   Last data filed at 08/22/2018 0646     Gross per 24 hour        Intake  2357.5 ml        Output  100 ml        Net  2257.5 ml             Physical Exam:        ??   GEN - AAOx3, comfortable, cachectic   HEENT - mucous membranes??moist; oral candidiasis is improving   Neck - supple, no JVD   Cardiac - RRR, S1, S2, no murmurs   Chest/Lungs - clear to auscultation without wheezes or rhonchi   Abdomen - soft,??mild tenderness without guarding or rigidity   Extremities - no clubbing/ cyanosis/ edema   Neuro - CN 2-12 intact. No focal deficits. No motor or sensory deficit appreciated.    Skin - no rashes or lesions        Current medications:           Current Facility-Administered Medications:    ?  bictegrav-emtricit-tenofov ala (BIKTARVY) 50-200-25 mg tablet 1 Tab, 1 Tab, Oral, DAILY, Jearld Adjutant, MD, 1 Tab at 08/21/18 1427   ?  ondansetron (ZOFRAN) injection 4 mg, 4 mg, IntraVENous, Q6H, Laurann Mcmorris S, MD, 4 mg at 08/22/18 0555   ?  promethazine (PHENERGAN) 12.5 mg in NS 50 mL IVPB, 12.5 mg, IntraVENous, Q6H PRN, Paizleigh Wilds S, MD, Last Rate: 200 mL/hr at 08/22/18 0822, 12.5 mg at 08/22/18 8295   ?  oxyCODONE-acetaminophen (PERCOCET) 5-325 mg per  tablet 1 Tab, 1 Tab, Oral, Q6H PRN, Ebony Rickel S, MD, 1 Tab at 08/22/18 0555   ?  dextrose 5% - 0.45% NaCl with KCl 40 mEq/L infusion, , IntraVENous, CONTINUOUS, Tauren Delbuono S, MD, Last Rate: 75 mL/hr at 08/22/18 0600   ?  acetaminophen (TYLENOL) tablet 650 mg, 650 mg, Oral, Q6H PRN, Sayuri Rhames S, MD   ?  albuterol-ipratropium (DUO-NEB) 2.5 MG-0.5 MG/3 ML, 3 mL, Nebulization, Q6H PRN, Ayansh Feutz S, MD   ?  dicyclomine (BENTYL) capsule 10 mg, 10 mg, Oral, Q6H PRN, Kaleiyah Polsky S, MD, 10 mg at 08/21/18 1205   ?  naloxone (NARCAN) injection 0.1 mg, 0.1 mg, IntraVENous, PRN, Tavoris Brisk S, MD   ?  enoxaparin (LOVENOX) injection 30 mg, 30 mg, SubCUTAneous, Q24H, Bryn Saline S, MD, 30 mg at 08/20/18 1138   ?  fluconazole (DIFLUCAN) 200mg /100 mL IVPB (premix), 200 mg, IntraVENous, Q24H, Laqueisha Catalina S, MD, Last Rate: 100 mL/hr at 08/21/18 0948, 200 mg at 08/21/18 0948   ?  sodium  bicarbonate tablet 1,300 mg, 1,300 mg, Oral, TID, Noami Bove S, MD, 1,300 mg at 08/21/18 2214   ?  albendazole [facility](ALBENZA) tablet 400 mg, 400 mg, Oral, BID WITH MEALS, Alfonso EllisMooney, Martha, MD, 400 mg at 08/21/18 1753   ?  trimethoprim-sulfamethoxazole (BACTRIM DS, SEPTRA DS) 160-800 mg per tablet 1 Tab, 1 Tab, Oral, Q MON, WED & Gita KudoFRI, Mooney, Martha, MD, 1 Tab at 08/21/18 [id number]        Labs:          Recent Results (from the past 24 hour(s))     CBC W/O DIFF          Collection Time: 08/22/18  5:50 AM         Result  Value  Ref Range            WBC  3.2 (L)  4.0 - 11.0 1000/mm3       RBC  4.46  3.80 - 5.70 M/uL       HGB  12.3 (L)  12.4 - 17.2 gm/dl       HCT  96.035.8 (L)  45.437.0 - 50.0 %       MCV  80.3  80.0 - 98.0 fL       MCH  27.6  23.0 - 34.6 pg       MCHC  34.4  30.0 - 36.0 gm/dl       PLATELET  098185  119140 - 450 1000/mm3       MPV  12.2 (H)  6.0 - 10.0 fL       RDW-SD  43.5  35.1 - 43.9         METABOLIC PANEL, BASIC          Collection Time: 08/22/18  5:50 AM         Result  Value  Ref Range            Sodium  142  136  - 145 mEq/L       Potassium  3.8  3.5 - 5.1 mEq/L       Chloride  119 (H)  98 - 107 mEq/L       CO2  18 (L)  21 - 32 mEq/L       Glucose  88  74 - 106 mg/dl       BUN  11  7 - 25 mg/dl       Creatinine  0.9  0.6 - 1.3 mg/dl       GFR est AA  >14>60          GFR est non-AA  >60          Calcium  7.3 (L)  8.5 - 10.1 mg/dl            Anion gap  6  5 - 15 mmol/L           XR Results:     Results from Hospital Encounter encounter on 08/18/18     XR CHEST SNGL V           Narrative  INDICATION:   SOB         EXAMINATION:   XR CHEST SNGL V      COMPARISON:   02/14/2016      FINDINGS:   The study shows a normal sized heart.. The lungs are clear and well expanded.  Impression  IMPRESSION:   Normal chest              CT Results:     Results from Hospital Encounter encounter on 08/06/18     CT ABD PELV WO CONT           Narrative  CT Abdomen and Pelvis without      Indication: Abdominal pain. Nausea, vomiting, diarrhea.      Comparison: 07/22/2018.      TECHNIQUE:    CT of the abdomen and pelvis WITHOUT intravenous contrast. Coronal and sagittal   reformations were obtained. Oral contrast was given.      All CT exams at this facility use one or more dose reduction techniques   including automatic exposure control, mA/kV adjustment per patient's size, or   iterative reconstruction technique. DICOM format imaged data is available to   non-affiliated external healthcare facilities or entities on a secure, media   free, reciprocal searchable basis with patient authorization for 12 months   following the date of the study.      DISCUSSION:      ABSENCE OF INTRAVENOUS CONTRAST DECREASES SENSITIVITY FOR DETECTION OF FOCAL   LESIONS AND VASCULAR PATHOLOGY.      LOWER THORAX: Normal.      HEPATOBILIARY: No focal hepatic lesions.  No biliary ductal dilatation.   SPLEEN: No splenomegaly.   PANCREAS: No focal masses or ductal dilatation.      ADRENALS: No adrenal nodules.   KIDNEYS/URETERS: No hydronephrosis, stones, or  solid mass lesions.   PELVIC ORGANS/BLADDER: Unremarkable.      PERITONEUM / RETROPERITONEUM: No free air or fluid.   LYMPH NODES: No lymphadenopathy.   VESSELS: Unremarkable.      GI TRACT: Multiple nondistended fluid-filled loops of small bowel with   questionable mild wall thickening. Fluid-filled colon with no significant wall   thickening. No obstruction. Normal appendix.      BONES AND SOFT TISSUES: No acute abnormality.              Impression  IMPRESSION:      Numerous fluid-filled loops of small and large bowel with borderline diffuse   small bowel wall thickening, concerning for infectious or inflammatory   enterocolitis. Findings have slightly progressed since 07/22/2018.              MRI Results:   No results found for this or any previous visit.      Nuclear Medicine Results:   No results found for this or any previous visit.      [place] Results:     Results from Hospital Encounter encounter on 07/21/18     [place] RUQ           Narrative  Clinical history: Elevated liver function studies      EXAMINATION:   Upper quadrant ultrasound 07/28/2018      FINDINGS:   Liver demonstrates homogeneous echotexture. No intrahepatic bile duct   dilatation. Bile duct measures 4 mm. No stones are seen in the gallbladder.   Gallbladder wall measures 2 mm. Sonographic Eulah Pont sign is negative.      Pancreas, proximal aorta and IVC are within normal limits. Right kidney measures   10.4 x 5.6 x 3.7 cm. Normal corticomedullary differentiation. No hydronephrosis.   Small amounts of perihepatic ascites.              Impression  IMPRESSION:   Small amounts of perihepatic ascites.  IR Results:   No results found for this or any previous visit.      VAS/US Results:   No results found for this or any previous visit.         Total clinical care time was 36 minutes of which more than 50% was spent in coordination of care and counseling (time spent with patient/family face to face, physical exam, reviewing laboratory and imaging  investigations, speaking with physicians and  nursing staff involved in this patient's care).       Tia MaskerUCHITA Bransen Fassnacht,  MD   [facility]   [facility]   August 22, 2018   Time: 9:13 AM

## 2018-08-23 LAB — CBC
Hematocrit: 35.4 % — ABNORMAL LOW (ref 37.0–50.0)
Hemoglobin: 12.3 gm/dl — ABNORMAL LOW (ref 12.4–17.2)
MCH: 27.9 pg (ref 23.0–34.6)
MCHC: 34.7 gm/dl (ref 30.0–36.0)
MCV: 80.3 fL (ref 80.0–98.0)
MPV: 12.3 fL — ABNORMAL HIGH (ref 6.0–10.0)
Platelets: 178 10*3/uL (ref 140–450)
RBC: 4.41 M/uL (ref 3.80–5.70)
RDW-SD: 44.6 — ABNORMAL HIGH (ref 35.1–43.9)
WBC: 3.4 10*3/uL — ABNORMAL LOW (ref 4.0–11.0)

## 2018-08-23 LAB — BASIC METABOLIC PANEL
Anion Gap: 9 mmol/L (ref 5–15)
BUN: 9 mg/dl (ref 7–25)
CO2: 16 mEq/L — ABNORMAL LOW (ref 21–32)
Calcium: 7.4 mg/dl — ABNORMAL LOW (ref 8.5–10.1)
Chloride: 117 mEq/L — ABNORMAL HIGH (ref 98–107)
Creatinine: 0.8 mg/dl (ref 0.6–1.3)
EGFR IF NonAfrican American: >60
GFR African American: >60
Glucose: 88 mg/dl (ref 74–106)
Potassium: 3.8 mEq/L (ref 3.5–5.1)
Sodium: 142 mEq/L (ref 136–145)

## 2018-08-23 LAB — CULTURE, STOOL

## 2018-08-23 LAB — CBC W/O DIFF
HCT: 35.4 % — ABNORMAL LOW (ref 37.0–50.0)
HGB: 12.3 gm/dl — ABNORMAL LOW (ref 12.4–17.2)
MCH: 27.9 pg (ref 23.0–34.6)
MCHC: 34.7 gm/dl (ref 30.0–36.0)
MCV: 80.3 fL (ref 80.0–98.0)
MPV: 12.3 fL — ABNORMAL HIGH (ref 6.0–10.0)
PLATELET: 178 10*3/uL (ref 140–450)
RBC: 4.41 M/uL (ref 3.80–5.70)
RDW-SD: 44.6 — ABNORMAL HIGH (ref 35.1–43.9)
WBC: 3.4 10*3/uL — ABNORMAL LOW (ref 4.0–11.0)

## 2018-08-23 LAB — METABOLIC PANEL, BASIC
Anion gap: 9 mmol/L (ref 5–15)
BUN: 9 mg/dl (ref 7–25)
CO2: 16 mEq/L — ABNORMAL LOW (ref 21–32)
Calcium: 7.4 mg/dl — ABNORMAL LOW (ref 8.5–10.1)
Chloride: 117 mEq/L — ABNORMAL HIGH (ref 98–107)
Creatinine: 0.8 mg/dl (ref 0.6–1.3)
GFR est AA: >60
GFR est non-AA: >60
Glucose: 88 mg/dl (ref 74–106)
Potassium: 3.8 mEq/L (ref 3.5–5.1)
Sodium: 142 mEq/L (ref 136–145)

## 2018-08-23 MED ORDER — DIPHENOXYLATE-ATROPINE 2.5 MG-0.025 MG TAB
Freq: Four times a day (QID) | ORAL | Status: DC | PRN
Start: 2018-08-23 — End: 2018-08-23
  Administered 2018-08-23: 14:00:00 via ORAL

## 2018-08-23 MED ORDER — LOPERAMIDE 2 MG CAP
2 mg | Freq: Once | ORAL | Status: AC
Start: 2018-08-23 — End: 2018-08-23
  Administered 2018-08-23: 18:00:00 via ORAL

## 2018-08-23 MED ORDER — LOPERAMIDE 2 MG CAP
2 mg | ORAL | Status: DC | PRN
Start: 2018-08-23 — End: 2018-08-26
  Administered 2018-08-25 – 2018-08-26 (×4): via ORAL

## 2018-08-23 MED FILL — LOPERAMIDE 2 MG CAP: 2 mg | ORAL | Qty: 2

## 2018-08-23 MED FILL — ONDANSETRON (PF) 4 MG/2 ML INJECTION: 4 mg/2 mL | INTRAMUSCULAR | Qty: 2

## 2018-08-23 MED FILL — DIPHENOXYLATE-ATROPINE 2.5 MG-0.025 MG TAB: ORAL | Qty: 1

## 2018-08-23 MED FILL — OXYCODONE-ACETAMINOPHEN 5 MG-325 MG TAB: 5-325 mg | ORAL | Qty: 1

## 2018-08-23 MED FILL — PROMETHAZINE IN NS 12.5 MG/50 ML IV PIGGY BAG: 12.5 mg/50 ml | INTRAVENOUS | Qty: 50

## 2018-08-23 MED FILL — SODIUM BICARBONATE 650 MG TAB: 650 mg | ORAL | Qty: 2

## 2018-08-23 MED FILL — BIKTARVY 50 MG-200 MG-25 MG TABLET: 50-200-25 mg | ORAL | Qty: 1

## 2018-08-23 MED FILL — FLUCONAZOLE IN SALINE (ISO-OSMOTIC) 200 MG/100 ML IV PIGGY BACK: 200 mg/100 mL | INTRAVENOUS | Qty: 100

## 2018-08-23 NOTE — Progress Notes (Signed)
Problem: Pain  Goal: *Control of Pain  Outcome: Progressing Towards Goal     Problem: Falls - Risk of  Goal: *Absence of Falls  Description: Document Schmid Fall Risk and appropriate interventions in the flowsheet.  Outcome: Progressing Towards Goal  Note: Fall Risk Interventions:  Mobility Interventions: Patient to call before getting OOB         Medication Interventions: Bed/chair exit alarm, Patient to call before getting OOB    Elimination Interventions: Call light in reach    History of Falls Interventions: Bed/chair exit alarm         Problem: Patient Education: Go to Patient Education Activity  Goal: Patient/Family Education  Outcome: Progressing Towards Goal

## 2018-08-23 NOTE — Progress Notes (Signed)
INFECTIOUS DISEASE PROGRESS NOTE       Requested by: Dr Ninetta Lights    Reason for consult: N/V/D in immunocopromised host    Date of admission: 08/18/2018    ABX:      Current abx Prior abx    Fluconazole 6/24     4  Bactrim prophylaxis 6/26  biktarvy started 6/26   nitazoxinide 6/13    11d  Flagyl 250 mg po bid                   2   x 7 d  Albendazole 400 mg bid  6/24   3       ASSESSMENT:        N/V/D/ dehydration    Uncontrolled parasitic infection of GI ( see below) likely-     Giardia should have responded but 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 in HIV- reconstitution of immune system with ART will help immensely     start ART as genomic testing reviewed-see below      initiate albendazol/flagyl  For rx of recalcitrant giardia-see below    Oral candidiasis and suspect esophagitis- improving      cryptosporidia/ Giardia concomitant infection   source unknown     D 11 of nitazoxinide PTA (see above)     6/24 now neg for giardia and persistent + cryptosporidia    Severe/constant nausea        HIV- new diagnosis    VL > 1 mil    cd4  17 abs/ 2%    hiv genomic testing for resistance sent 6/15- no resistance [ results available 6/26 ]  Started Biktarvy 6/26       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    ho neutropenia with adequate anc    nkda            RECOMMENDATIONS:    Severe nausea- hence flagyl d/ced and repeat giardia ag neg     -Expect cryptosporidia to resolve with immune reconstitution - started on Biktarvy 6/26 as genotype neg for any resistance  -will give imodium 4mg  now and 2mg  every 4 hr as needed for diarrhea   -D/w Dr Ninetta Lights, if his diarrhea is better on imodium, can use 3-4 times a day prn to control it as likely will take weeks before his CD4 count will come up. Can gone home on imodium prn once diarrhea better controlled.  - also recommend high fiber , low fat , frequent small meals. Avoid  caffeine and lactose [milk product] - d/w pt again  - Rehydration per IM  - Agree with fluconazole- cont till CD4 above atleast 100, ideally recommended till CD4 around 200  - Hold azithro for now- if no nausea, restart next week  - cont pcp prophylaxis w/ bactrim  - Cont Biktarvy  - f/up EVMS ID post discharge              MICROBIOLOGY:       PTA  5/27  Neg c diff  6/2    Stool culture neg  6/12  + for BOTH giardia and cryptosporidia-- lab repeated test with same results    Currrent  6/24  Stool neg CDI           Stool cullture IP           Crypto ag positive           Giardia ag neg     cc   Pt  c/o still diarrhea , says had total 11 since yesterday morning. However he can not tell me how many he had after getting lomotil yesterday. Since midnight he has had only 2 episodes so far. Again advised pt to avoid caffeine, milk. And to take his fibers, eat small frequent meals.  ??  Current Medications:     Current Facility-Administered Medications   Medication Dose Route Frequency   ??? diphenoxylate-atropine (LOMOTIL) tablet 1 Tab  1 Tab Oral QID PRN   ??? diphenoxylate-atropine (LOMOTIL) tablet 1 Tab  1 Tab Oral Q12H   ??? bictegrav-emtricit-tenofov ala (BIKTARVY) 50-200-25 mg tablet 1 Tab  1 Tab Oral DAILY   ??? ondansetron (ZOFRAN) injection 4 mg  4 mg IntraVENous Q6H   ??? promethazine (PHENERGAN) 12.5 mg in NS 50 mL IVPB  12.5 mg IntraVENous Q6H PRN   ??? oxyCODONE-acetaminophen (PERCOCET) 5-325 mg per tablet 1 Tab  1 Tab Oral Q6H PRN   ??? dextrose 5% - 0.45% NaCl with KCl 40 mEq/L infusion   IntraVENous CONTINUOUS   ??? acetaminophen (TYLENOL) tablet 650 mg  650 mg Oral Q6H PRN   ??? albuterol-ipratropium (DUO-NEB) 2.5 MG-0.5 MG/3 ML  3 mL Nebulization Q6H PRN   ??? naloxone (NARCAN) injection 0.1 mg  0.1 mg IntraVENous PRN   ??? enoxaparin (LOVENOX) injection 30 mg  30 mg SubCUTAneous Q24H   ??? fluconazole (DIFLUCAN) 200mg /100 mL IVPB (premix)  200 mg IntraVENous Q24H   ??? sodium bicarbonate tablet 1,300 mg  1,300 mg Oral TID    ??? trimethoprim-sulfamethoxazole (BACTRIM DS, SEPTRA DS) 160-800 mg per tablet 1 Tab  1 Tab Oral Q MON, WED & FRI         Physical Exam:  Vitals  Temp (24hrs), Avg:98 ??F (36.7 ??C), Min:97.3 ??F (36.3 ??C), Max:98.6 ??F (37 ??C)    Visit Vitals  BP 121/83 (BP 1 Location: Right arm, BP Patient Position: Supine)   Pulse 75   Temp 97.3 ??F (36.3 ??C)   Resp 16   Ht 5\' 9"  (1.753 m)   Wt 66.4 kg (146 lb 6.2 oz)   SpO2 100%   BMI 21.62 kg/m??       General: Well-developed, 28 y.o. year-old, male, in no acute distress  HEENT: Normocephalic, anicteric sclerae, Pupils equal, round reactive to light, no oropharyngeal lesions. Thrush improved  Neck: Supple, no lymphadenopathy, masses or thyromegaly  Chest: Symmetrical expansion  Lungs: Clear to auscultation bilaterally, no dullness  Heart: Regular rhythm, no murmur, no rub or gallop, No JVD  Abdomen: Soft, TTP, diffusely. No rebound.non distended, no organomegaly, BS+.  Musculoskeletal: Normal strength/tone. No edema. No clubbing or cyanosis  CNS: AAOx3. Cranial nerves II-XII intact. Grossly normal.No NR  SKIN: No skin lesion or rash. Dry, warm, intact          Labs: Results:   Chemistry Recent Labs     08/23/18  0831 08/22/18  0550 08/20/18  1557   GLU 88 88  --    NA 142 142  --    K 3.8 3.8 3.3*   CL 117* 119*  --    CO2 16* 18*  --    BUN 9 11  --    CREA 0.8 0.9  --    CA 7.4* 7.3*  --    AGAP 9 6  --       CBC w/Diff Recent Labs     08/23/18  0831 08/22/18  0550   WBC 3.4* 3.2*   RBC 4.41 4.46  HGB 12.3* 12.3*   HCT 35.4* 35.8*   PLT 178 185      Microbiology No results for input(s): CULT in the last 72 hours.       Imaging-    Results from Hospital Encounter encounter on 08/18/18   XR CHEST SNGL V    Narrative INDICATION:  SOB       EXAMINATION:  XR CHEST SNGL V    COMPARISON:  02/14/2016    FINDINGS:  The study shows a normal sized heart.. The lungs are clear and well expanded.           Impression IMPRESSION:  Normal chest          Results from Hospital Encounter encounter on 08/06/18   CT ABD PELV WO CONT    Narrative CT Abdomen and Pelvis without    Indication: Abdominal pain. Nausea, vomiting, diarrhea.    Comparison: 07/22/2018.    TECHNIQUE:   CT of the abdomen and pelvis WITHOUT intravenous contrast. Coronal and sagittal  reformations were obtained. Oral contrast was given.    All CT exams at this facility use one or more dose reduction techniques  including automatic exposure control, mA/kV adjustment per patient's size, or  iterative reconstruction technique. DICOM format imaged data is available to  non-affiliated external healthcare facilities or entities on a secure, media  free, reciprocal searchable basis with patient authorization for 12 months  following the date of the study.    DISCUSSION:    ABSENCE OF INTRAVENOUS CONTRAST DECREASES SENSITIVITY FOR DETECTION OF FOCAL  LESIONS AND VASCULAR PATHOLOGY.    LOWER THORAX: Normal.    HEPATOBILIARY: No focal hepatic lesions.  No biliary ductal dilatation.  SPLEEN: No splenomegaly.  PANCREAS: No focal masses or ductal dilatation.    ADRENALS: No adrenal nodules.  KIDNEYS/URETERS: No hydronephrosis, stones, or solid mass lesions.  PELVIC ORGANS/BLADDER: Unremarkable.    PERITONEUM / RETROPERITONEUM: No free air or fluid.  LYMPH NODES: No lymphadenopathy.  VESSELS: Unremarkable.    GI TRACT: Multiple nondistended fluid-filled loops of small bowel with  questionable mild wall thickening. Fluid-filled colon with no significant wall  thickening. No obstruction. Normal appendix.    BONES AND SOFT TISSUES: No acute abnormality.      Impression IMPRESSION:    Numerous fluid-filled loops of small and large bowel with borderline diffuse  small bowel wall thickening, concerning for infectious or inflammatory  enterocolitis. Findings have slightly progressed since 07/22/2018.         ---------------------------------------------------------------------------------------------------------------   I have independently examined the patient and reviewed all lab studies and imgaing as well as review of nursing notes and physican notes     33 min spent with > 50% of time spent counseling and coordination of care    Radford PaxSushma Eletha Culbertson, MD  08/23/2018    [facility]  [phone]

## 2018-08-23 NOTE — Progress Notes (Signed)
INTERNAL MEDICINE PROGRESS NOTE    Date of note:      August 23, 2018    Patient:               Travis [name], 28 y.o., male  Admit Date:        08/18/2018  Length of Stay:  4 day(s)    Problem List:   Patient Active Problem List   Diagnosis Code   ??? AKI (acute kidney injury) (HCC) N17.9   ??? Dehydration E86.0   ??? Enterocolitis K52.9   ??? Intractable nausea and vomiting R11.2   ??? Diarrhea R19.7   ??? Metabolic acidosis E87.2   ??? HIV (human immunodeficiency virus infection) (HCC) B20       Subjective + interval history     Still feels nauseous with poor appetite but diarrhea is somewhat improved this morning.  Assessment:   ??  1. Nausea, vomiting, dehydration  2. 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 Acute kidney injury present on admission  4. Metabolic acidosis???urine anion gap negative.  Likely metabolic acidosis from GI losses.  5. Oral candidiasis  6. 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  ??  Plan:   ??  Continue [name] hydration and potassium replacement till oral intake improves.  AKI has resolved  ??   ??Continue sodium bicarb tablet.  ??????  Flagyl stopped due to severe nausea, albendazole stopped on 6/27 due to ineffectiveness; azithromycin on hold until nausea improves  ??  Patient was started on Biktarvy on 6/26. Hopefully once his CD4 count improves, he will be able to get rid of diarrhea    Lomotil has been started.  Will wait and watch  ????  Continue??[name] fluconazole for oropharyngeal candidiasis  ??  Bactrim 3 times a week for PCP prophylaxis  ??  Patient refused Lovenox for DVT prophylaxis.  I encouraged the patient to keep exercising his legs and ambulate to prevent DVT.  ??  Continue nutritional supplements  ??  Continue current antiemetic regimen.   ??  Discussed at length with patient's mom on phone - [phone]  ??  - Code status:??Full code  ??  Recommend to continue hospitalization.   Expected date of discharge:??TBD  Plan for disposition -??tbd  ??  ??      Objective:       Visit Vitals  BP 128/84 (BP 1 Location: Left arm, BP Patient Position: Supine)   Pulse 77   Temp 98.2 ??F (36.8 ??C)   Resp 18   Ht 5\' 9"  (1.753 m)   Wt 66.4 kg (146 lb 6.2 oz)   SpO2 100%   BMI 21.62 kg/m??           Intake/Output Summary (Last 24 hours) at 08/23/2018 0851  Last data filed at 08/23/2018 [id number]  Gross per 24 hour   Intake 2727.5 ml   Output ???   Net 2727.5 ml       Physical Exam:     GEN -anxious, not in distress  HEENT - mucous membranes??moist; oral candidiasis is improving  Neck - supple, no JVD  Cardiac - RRR, S1, S2, no murmurs  Chest/Lungs - clear to auscultation without wheezes or rhonchi  Abdomen - soft,??mild tenderness without guarding or rigidity  Extremities - no clubbing/ cyanosis/ edema  Neuro - CN 2-12 intact. No focal deficits. No motor or sensory deficit appreciated.  Skin - no rashes or lesions    Current medications:       Current Facility-Administered Medications:   ???  diphenoxylate-atropine (LOMOTIL) tablet 1 Tab, 1 Tab, Oral, Q12H, Truman Hayward, MD, 1 Tab at 08/22/18 1638  ???  bictegrav-emtricit-tenofov ala (BIKTARVY) 50-200-25 mg tablet 1 Tab, 1 Tab, Oral, DAILY, Jearld Adjutant, MD, 1 Tab at 08/22/18 1039  ???  ondansetron (ZOFRAN) injection 4 mg, 4 mg, IntraVENous, Q6H, Brielle Moro S, MD, 4 mg at 08/23/18 0600  ???  promethazine (PHENERGAN) 12.5 mg in NS 50 mL IVPB, 12.5 mg, IntraVENous, Q6H PRN, Albert Devaul S, MD, Last Rate: 200 mL/hr at 08/22/18 2200, 12.5 mg at 08/22/18 2200  ???  oxyCODONE-acetaminophen (PERCOCET) 5-325 mg per tablet 1 Tab, 1 Tab, Oral, Q6H PRN, Ruchel Brandenburger S, MD, 1 Tab at 08/23/18 0646  ???  dextrose 5% - 0.45% NaCl with KCl 40 mEq/L infusion, , IntraVENous, CONTINUOUS, Reka Wist S, MD, Last Rate: 75 mL/hr at 08/22/18 2200  ???  acetaminophen (TYLENOL) tablet 650 mg, 650 mg, Oral, Q6H PRN, Alixis Harmon S, MD   ???  albuterol-ipratropium (DUO-NEB) 2.5 MG-0.5 MG/3 ML, 3 mL, Nebulization, Q6H PRN, Dakiya Puopolo S, MD  ???  dicyclomine (BENTYL) capsule 10 mg, 10 mg, Oral, Q6H PRN, Alicia Ackert S, MD, 10 mg at 08/21/18 1205  ???  naloxone (NARCAN) injection 0.1 mg, 0.1 mg, IntraVENous, PRN, Nehal Witting, Legrand Como, MD  ???  enoxaparin (LOVENOX) injection 30 mg, 30 mg, SubCUTAneous, Q24H, Dalin Caldera S, MD, 30 mg at 08/20/18 1138  ???  fluconazole (DIFLUCAN) 200mg /100 mL IVPB (premix), 200 mg, IntraVENous, Q24H, Malonie Tatum S, MD, Last Rate: 100 mL/hr at 08/22/18 0929, 200 mg at 08/22/18 1660  ???  sodium bicarbonate tablet 1,300 mg, 1,300 mg, Oral, TID, Ibrohim Simmers S, MD, 1,300 mg at 08/22/18 2200  ???  trimethoprim-sulfamethoxazole (BACTRIM DS, SEPTRA DS) 160-800 mg per tablet 1 Tab, 1 Tab, Oral, Q MON, WED & Christin Bach, MD, 1 Tab at 08/21/18 6301    Labs:     No results found for this or any previous visit (from the past 24 hour(s)).    XR Results:  Results from Hospital Encounter encounter on 08/18/18   XR CHEST SNGL V    Narrative INDICATION:  SOB       EXAMINATION:  XR CHEST SNGL V    COMPARISON:  02/14/2016    FINDINGS:  The study shows a normal sized heart.. The lungs are clear and well expanded.           Impression IMPRESSION:  Normal chest         CT Results:  Results from Hospital Encounter encounter on 08/06/18   CT ABD PELV WO CONT    Narrative CT Abdomen and Pelvis without    Indication: Abdominal pain. Nausea, vomiting, diarrhea.    Comparison: 07/22/2018.    TECHNIQUE:   CT of the abdomen and pelvis WITHOUT intravenous contrast. Coronal and sagittal  reformations were obtained. Oral contrast was given.    All CT exams at this facility use one or more dose reduction techniques  including automatic exposure control, mA/kV adjustment per patient's size, or  iterative reconstruction technique. DICOM format imaged data is available to  non-affiliated external healthcare facilities or entities on a secure,  media  free, reciprocal searchable basis with patient authorization for 12 months  following the date of the study.    DISCUSSION:    ABSENCE OF INTRAVENOUS CONTRAST DECREASES SENSITIVITY  FOR DETECTION OF FOCAL  LESIONS AND VASCULAR PATHOLOGY.    LOWER THORAX: Normal.    HEPATOBILIARY: No focal hepatic lesions.  No biliary ductal dilatation.  SPLEEN: No splenomegaly.  PANCREAS: No focal masses or ductal dilatation.    ADRENALS: No adrenal nodules.  KIDNEYS/URETERS: No hydronephrosis, stones, or solid mass lesions.  PELVIC ORGANS/BLADDER: Unremarkable.    PERITONEUM / RETROPERITONEUM: No free air or fluid.  LYMPH NODES: No lymphadenopathy.  VESSELS: Unremarkable.    GI TRACT: Multiple nondistended fluid-filled loops of small bowel with  questionable mild wall thickening. Fluid-filled colon with no significant wall  thickening. No obstruction. Normal appendix.    BONES AND SOFT TISSUES: No acute abnormality.      Impression IMPRESSION:    Numerous fluid-filled loops of small and large bowel with borderline diffuse  small bowel wall thickening, concerning for infectious or inflammatory  enterocolitis. Findings have slightly progressed since 07/22/2018.         MRI Results:  No results found for this or any previous visit.    Nuclear Medicine Results:  No results found for this or any previous visit.    US Results:  Results from Hospital Encounter encounter on 07/21/18   US RUQ    Narrative Clinical history: Elevated liver function studies    EXAMINATION:  Upper quadrant ultrasound 07/28/2018    FINDINGS:  Liver demonstrates homogeneous echotexture. No intrahepatic bile duct  dilatation. Bile duct measures 4 mm. No stones are seen in the gallbladder.  Gallbladder wall measures 2 mm. Sonographic Eulah PontMurphy sign is negative.    Pancreas, proximal aorta and IVC are within normal limits. Right kidney measures  10.4 x 5.6 x 3.7 cm. Normal corticomedullary differentiation. No hydronephrosis.  Small amounts of perihepatic ascites.       Impression IMPRESSION:  Small amounts of perihepatic ascites.         IR Results:  No results found for this or any previous visit.    VAS/US Results:  No results found for this or any previous visit.      Total clinical care time was 35 minutes of which more than 50% was spent in coordination of care and counseling (time spent with patient/family face to face, physical exam, reviewing laboratory and imaging investigations, speaking with physicians and nursing staff involved in this patient's care).     Tia MaskerUCHITA Malanie Koloski,  MD  [facility]  [facility]  August 23, 2018  Time: 8:51 AM

## 2018-08-23 NOTE — Progress Notes (Signed)
Problem: Pain  Goal: *Control of Pain  Outcome: Progressing Towards Goal     Problem: Falls - Risk of  Goal: *Absence of Falls  Description: Document Schmid Fall Risk and appropriate interventions in the flowsheet.  Outcome: Progressing Towards Goal  Note: Fall Risk Interventions:  Mobility Interventions: Assess mobility with egress test, Bed/chair exit alarm         Medication Interventions: Patient to call before getting OOB    Elimination Interventions: Bed/chair exit alarm, Call light in reach    History of Falls Interventions: Bed/chair exit alarm         Problem: Patient Education: Go to Patient Education Activity  Goal: Patient/Family Education  Outcome: Progressing Towards Goal

## 2018-08-23 NOTE — Progress Notes (Signed)
Problem: Pain  Goal: *Control of Pain  Outcome: Progressing Towards Goal     Problem: Falls - Risk of  Goal: *Absence of Falls  Description: Document Schmid Fall Risk and appropriate interventions in the flowsheet.  Outcome: Progressing Towards Goal  Note: Fall Risk Interventions:  Mobility Interventions: Patient to call before getting OOB         Medication Interventions: Bed/chair exit alarm, Patient to call before getting OOB    Elimination Interventions: Call light in reach    History of Falls Interventions: Bed/chair exit alarm         Problem: Patient Education: Go to Patient Education Activity  Goal: Patient/Family Education  Outcome: Progressing Towards Goal

## 2018-08-23 NOTE — Progress Notes (Signed)
Problem: Pain  Goal: *Control of Pain  Outcome: Progressing Towards Goal     Problem: Falls - Risk of  Goal: *Absence of Falls  Description: Document Schmid Fall Risk and appropriate interventions in the flowsheet.  Outcome: Progressing Towards Goal  Note: Fall Risk Interventions:  Mobility Interventions: Assess mobility with egress test, Bed/chair exit alarm         Medication Interventions: Patient to call before getting OOB    Elimination Interventions: Bed/chair exit alarm, Call light in reach    History of Falls Interventions: Bed/chair exit alarm         Problem: Patient Education: Go to Patient Education Activity  Goal: Patient/Family Education  Outcome: Progressing Towards Goal

## 2018-08-23 NOTE — Progress Notes (Signed)
Progress Notes by Salley SlaughterSimoes, Tayven Renteria S, MD at 08/23/18 [id number]                Author: Salley SlaughterSimoes, Blia Totman S, MD  Service: Hospitalist  Author Type: Physician       Filed: 08/23/18 1500  Date of Service: 08/23/18 0851  Status: Signed          Editor: Salley SlaughterSimoes, Mostyn Varnell S, MD (Physician)                          INTERNAL MEDICINE PROGRESS NOTE      Date of note:      August 23, 2018      Patient:               Travis [name],  28 y.o., male   Admit Date:        08/18/2018   Length of Stay:  4 day(s)      Problem List:      Patient Active Problem List        Diagnosis  Code         ?  AKI (acute kidney injury) (HCC)  N17.9     ?  Dehydration  E86.0     ?  Enterocolitis  K52.9         ?  Intractable nausea and vomiting  R11.2         ?  Diarrhea  R19.7     ?  Metabolic acidosis  E87.2         ?  HIV (human immunodeficiency virus infection) (HCC)  B20             Subjective + interval history        Still feels nauseous with poor appetite but diarrhea is somewhat improved this morning.     Assessment:     ??   1.  Nausea, vomiting, dehydration   2.  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  Acute kidney injury present on admission   4.  Metabolic acidosis-urine anion gap negative.  Likely metabolic acidosis from GI losses.   5.  Oral candidiasis   6.  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  Severe protein calorie malnutrition   8.  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   ??     Plan:     ??   Continue [name] hydration and potassium replacement till oral intake improves.  AKI has resolved   ??    ??Continue sodium bicarb tablet.   ??????   Flagyl stopped due to severe nausea, albendazole stopped on 6/27 due to ineffectiveness; azithromycin on hold until nausea improves   ??   Patient was started on Biktarvy on 6/26. Hopefully once his CD4 count improves, he will be able to get rid of diarrhea      Lomotil has been started.  Will wait and watch   ????   Continue??[name] fluconazole for oropharyngeal candidiasis   ??    Bactrim 3 times a week for PCP prophylaxis   ??   Patient refused Lovenox for DVT prophylaxis.  I encouraged the patient to keep exercising his legs and ambulate to prevent DVT.   ??   Continue nutritional supplements   ??   Continue current antiemetic regimen.    ??   Discussed at length with patient's mom on phone - [phone]   ??   -  Code status:??Full code   ??   Recommend to continue hospitalization.   Expected date of discharge:??TBD   Plan for disposition -??tbd   ??   ??           Objective:           Visit Vitals      BP  128/84 (BP 1 Location: Left arm, BP Patient Position: Supine)     Pulse  77     Temp  98.2 ??F (36.8 ??C)     Resp  18     Ht  5\' 9"  (1.753 m)     Wt  66.4 kg (146 lb 6.2 oz)     SpO2  100%        BMI  21.62 kg/m??                 Intake/Output Summary (Last 24 hours) at 08/23/2018 0851   Last data filed at 08/23/2018 [id number]     Gross per 24 hour        Intake  2727.5 ml        Output  --        Net  2727.5 ml             Physical Exam:        GEN -anxious, not in distress   HEENT - mucous membranes??moist; oral candidiasis is improving   Neck - supple, no JVD   Cardiac - RRR, S1, S2, no murmurs   Chest/Lungs - clear to auscultation without wheezes or rhonchi   Abdomen - soft,??mild tenderness without guarding or rigidity   Extremities - no clubbing/ cyanosis/ edema   Neuro - CN 2-12 intact. No focal deficits. No motor or sensory deficit appreciated.    Skin - no rashes or lesions        Current medications:           Current Facility-Administered Medications:    ?  diphenoxylate-atropine (LOMOTIL) tablet 1 Tab, 1 Tab, Oral, Q12H, Radford PaxSingh, Sushma, MD, 1 Tab at 08/22/18 1638   ?  bictegrav-emtricit-tenofov ala (BIKTARVY) 50-200-25 mg tablet 1 Tab, 1 Tab, Oral, DAILY, Alfonso EllisMooney, Martha, MD, 1 Tab at 08/22/18 1039   ?  ondansetron (ZOFRAN) injection 4 mg, 4 mg, IntraVENous, Q6H, Leonia Heatherly S, MD, 4 mg at 08/23/18 0600   ?  promethazine (PHENERGAN) 12.5 mg in NS 50 mL IVPB, 12.5 mg, IntraVENous, Q6H PRN,  Merlie Noga S, MD, Last Rate: 200 mL/hr at 08/22/18 2200, 12.5 mg at 08/22/18 2200   ?  oxyCODONE-acetaminophen (PERCOCET) 5-325 mg per tablet 1 Tab, 1 Tab, Oral, Q6H PRN, Payton Moder, Reina Fuseuchita S, MD, 1 Tab at 08/23/18 0646   ?  dextrose 5% - 0.45% NaCl with KCl 40 mEq/L infusion, , IntraVENous, CONTINUOUS, Lorrane Mccay S, MD, Last Rate: 75 mL/hr at 08/22/18 2200   ?  acetaminophen (TYLENOL) tablet 650 mg, 650 mg, Oral, Q6H PRN, Micaila Ziemba S, MD   ?  albuterol-ipratropium (DUO-NEB) 2.5 MG-0.5 MG/3 ML, 3 mL, Nebulization, Q6H PRN, Riley Papin S, MD   ?  dicyclomine (BENTYL) capsule 10 mg, 10 mg, Oral, Q6H PRN, Dayven Linsley S, MD, 10 mg at 08/21/18 1205   ?  naloxone (NARCAN) injection 0.1 mg, 0.1 mg, IntraVENous, PRN, Tessica Cupo S, MD   ?  enoxaparin (LOVENOX) injection 30 mg, 30 mg, SubCUTAneous, Q24H, Jessee Newnam S, MD, 30 mg at 08/20/18 1138   ?  fluconazole (DIFLUCAN) 200mg /100 mL IVPB (premix),  200 mg, IntraVENous, Q24H, Ayaan Shutes S, MD, Last Rate: 100 mL/hr at 08/22/18 0929, 200 mg at 08/22/18 0929   ?  sodium bicarbonate tablet 1,300 mg, 1,300 mg, Oral, TID, Raylie Maddison S, MD, 1,300 mg at 08/22/18 2200   ?  trimethoprim-sulfamethoxazole (BACTRIM DS, SEPTRA DS) 160-800 mg per tablet 1 Tab, 1 Tab, Oral, Q MON, WED & Gita KudoFRI, Mooney, Martha, MD, 1 Tab at 08/21/18 [id number]        Labs:        No results found for this or any previous visit (from the past 24 hour(s)).      XR Results:     Results from Hospital Encounter encounter on 08/18/18     XR CHEST SNGL V           Narrative  INDICATION:   SOB         EXAMINATION:   XR CHEST SNGL V      COMPARISON:   02/14/2016      FINDINGS:   The study shows a normal sized heart.. The lungs are clear and well expanded.                    Impression  IMPRESSION:   Normal chest              CT Results:     Results from Hospital Encounter encounter on 08/06/18     CT ABD PELV WO CONT           Narrative  CT Abdomen and Pelvis without      Indication:  Abdominal pain. Nausea, vomiting, diarrhea.      Comparison: 07/22/2018.      TECHNIQUE:    CT of the abdomen and pelvis WITHOUT intravenous contrast. Coronal and sagittal   reformations were obtained. Oral contrast was given.      All CT exams at this facility use one or more dose reduction techniques   including automatic exposure control, mA/kV adjustment per patient's size, or   iterative reconstruction technique. DICOM format imaged data is available to   non-affiliated external healthcare facilities or entities on a secure, media   free, reciprocal searchable basis with patient authorization for 12 months   following the date of the study.      DISCUSSION:      ABSENCE OF INTRAVENOUS CONTRAST DECREASES SENSITIVITY FOR DETECTION OF FOCAL   LESIONS AND VASCULAR PATHOLOGY.      LOWER THORAX: Normal.      HEPATOBILIARY: No focal hepatic lesions.  No biliary ductal dilatation.   SPLEEN: No splenomegaly.   PANCREAS: No focal masses or ductal dilatation.      ADRENALS: No adrenal nodules.   KIDNEYS/URETERS: No hydronephrosis, stones, or solid mass lesions.   PELVIC ORGANS/BLADDER: Unremarkable.      PERITONEUM / RETROPERITONEUM: No free air or fluid.   LYMPH NODES: No lymphadenopathy.   VESSELS: Unremarkable.      GI TRACT: Multiple nondistended fluid-filled loops of small bowel with   questionable mild wall thickening. Fluid-filled colon with no significant wall   thickening. No obstruction. Normal appendix.      BONES AND SOFT TISSUES: No acute abnormality.              Impression  IMPRESSION:      Numerous fluid-filled loops of small and large bowel with borderline diffuse   small bowel wall thickening, concerning for infectious or inflammatory   enterocolitis. Findings have slightly progressed since 07/22/2018.  MRI Results:   No results found for this or any previous visit.      Nuclear Medicine Results:   No results found for this or any previous visit.      [place] Results:     Results from [place] encounter on 07/21/18     [place] RUQ           Narrative  Clinical history: Elevated liver function studies      EXAMINATION:   Upper quadrant ultrasound 07/28/2018      FINDINGS:   Liver demonstrates homogeneous echotexture. No intrahepatic bile duct   dilatation. Bile duct measures 4 mm. No stones are seen in the gallbladder.   Gallbladder wall measures 2 mm. Sonographic Percell Miller sign is negative.      Pancreas, proximal aorta and IVC are within normal limits. Right kidney measures   10.4 x 5.6 x 3.7 cm. Normal corticomedullary differentiation. No hydronephrosis.   Small amounts of perihepatic ascites.              Impression  IMPRESSION:   Small amounts of perihepatic ascites.              IR Results:   No results found for this or any previous visit.      VAS/US Results:   No results found for this or any previous visit.         Total clinical care time was 35 minutes of which more than 50% was spent in coordination of care and counseling (time spent with patient/family face to face, physical exam, reviewing laboratory and imaging investigations, speaking with physicians and  nursing staff involved in this patient's care).       Perfecto Kingdom,  MD   [facility]   August 23, 2018   Time: 8:51 AM

## 2018-08-23 NOTE — Progress Notes (Signed)
Progress  Notes by Radford PaxSingh, Karrah Mangini, MD at 08/23/18 1259                Author: Radford PaxSingh, Saydi Kobel, MD  Service: Infectious Disease  Author Type: Physician       Filed: 08/23/18 1305  Date of Service: 08/23/18 1259  Status: Signed          Editor: Radford PaxSingh, Rebecka Oelkers, MD (Physician)                                                                                       INFECTIOUS DISEASE PROGRESS NOTE          Requested by: Dr Sandie AnoSimoes      Reason for consult: N/V/D in immunocopromised host      Date of admission: 08/18/2018        ABX:            Current abx  Prior abx      Fluconazole 6/24     4   Bactrim prophylaxis 6/26   biktarvy started 6/26    nitazoxinide 6/13    11d   Flagyl 250 mg po bid                   2   x 7 d   Albendazole 400 mg bid  6/24   3            ASSESSMENT:             N/V/D/ dehydration     Uncontrolled parasitic infection of GI ( see below) likely-      Giardia should have responded but 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 in HIV- reconstitution of immune system with ART will help immensely      start ART as genomic testing reviewed-see below       initiate albendazol/flagyl  For rx of recalcitrant giardia-see below      Oral candidiasis and suspect esophagitis - improving         cryptosporidia/ Giardia concomitant infection    source unknown      D 11 of nitazoxinide PTA (see above)      6/24 now neg for giardia and persistent + cryptosporidia     Severe/constant nausea           HIV- new diagnosis     VL > 1 mil     cd4  17 abs/ 2%     hiv genomic testing for resistance sent 6/15- no resistance [ results available 6/26 ]   Started Biktarvy 6/26          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      ho neutropenia with adequate anc      nkda                   RECOMMENDATIONS:      Severe nausea- hence flagyl d/ced and repeat giardia ag neg       -Expect cryptosporidia to resolve with immune reconstitution - started on Biktarvy 6/26 as genotype neg for any resistance   -will give imodium 4mg  now and 2mg   every 4 hr  as needed for diarrhea    -D/w Dr Ninetta Lights, if his diarrhea is better on imodium, can use 3-4 times a day prn to control it as likely will take weeks before his CD4 count will come up. Can gone home on imodium prn once diarrhea better controlled.   - also recommend high fiber , low fat , frequent small meals. Avoid caffeine and lactose [milk product] - d/w pt again   - Rehydration per IM   - Agree with fluconazole- cont till CD4 above atleast 100, ideally recommended till CD4 around 200   - Hold azithro for now- if no nausea, restart next week   - cont pcp prophylaxis w/ bactrim   - Cont Biktarvy   - f/up [facility] ID post discharge                  MICROBIOLOGY:          PTA   5/27  Neg c diff   6/2    Stool culture neg   6/12  + for BOTH giardia and cryptosporidia-- lab repeated test with same results      Currrent   6/24  Stool neg CDI            Stool cullture IP            Crypto ag positive            Giardia ag neg         cc     Pt c/o still diarrhea , says had total 11 since yesterday morning. However he can not tell me how many he had after getting lomotil yesterday. Since midnight  he has had only 2 episodes so far. Again advised pt to avoid caffeine, milk. And to take his fibers, eat small frequent meals.   ??     Current Medications:          Current Facility-Administered Medications          Medication  Dose  Route  Frequency           ?  diphenoxylate-atropine (LOMOTIL) tablet 1 Tab   1 Tab  Oral  QID PRN     ?  diphenoxylate-atropine (LOMOTIL) tablet 1 Tab   1 Tab  Oral  Q12H     ?  bictegrav-emtricit-tenofov ala (BIKTARVY) 50-200-25 mg tablet 1 Tab   1 Tab  Oral  DAILY           ?  ondansetron (ZOFRAN) injection 4 mg   4 mg  IntraVENous  Q6H           ?  promethazine (PHENERGAN) 12.5 mg in NS 50 mL IVPB   12.5 mg  IntraVENous  Q6H PRN     ?  oxyCODONE-acetaminophen (PERCOCET) 5-325 mg per tablet 1 Tab   1 Tab  Oral  Q6H PRN     ?  dextrose 5% - 0.45% NaCl with KCl 40 mEq/L infusion      IntraVENous  CONTINUOUS     ?  acetaminophen (TYLENOL) tablet 650 mg   650 mg  Oral  Q6H PRN     ?  albuterol-ipratropium (DUO-NEB) 2.5 MG-0.5 MG/3 ML   3 mL  Nebulization  Q6H PRN     ?  naloxone (NARCAN) injection 0.1 mg   0.1 mg  IntraVENous  PRN     ?  enoxaparin (LOVENOX) injection 30 mg   30 mg  SubCUTAneous  Q24H     ?  fluconazole (DIFLUCAN) 200mg /100 mL IVPB (premix)   200 mg  IntraVENous  Q24H     ?  sodium bicarbonate tablet 1,300 mg   1,300 mg  Oral  TID           ?  trimethoprim-sulfamethoxazole (BACTRIM DS, SEPTRA DS) 160-800 mg per tablet 1 Tab   1 Tab  Oral  Q MON, WED & FRI              Physical Exam:   Vitals   Temp (24hrs), Avg:98 ??F (36.7 ??C), Min:97.3 ??F (36.3 ??C), Max:98.6 ??F (37 ??C)      Visit Vitals      BP  121/83 (BP 1 Location: Right arm, BP Patient Position: Supine)     Pulse  75     Temp  97.3 ??F (36.3 ??C)     Resp  16     Ht  5\' 9"  (1.753 m)     Wt  66.4 kg (146 lb 6.2 oz)     SpO2  100%        BMI  21.62 kg/m??           General: Well-developed, 28 y.o. year-old, male , in no acute distress   HEENT: Normocephalic, anicteric sclerae, Pupils equal, round reactive to light, no oropharyngeal lesions. Thrush improved   Neck: Supple, no lymphadenopathy, masses or thyromegaly   Chest: Symmetrical expansion   Lungs: Clear to auscultation bilaterally, no dullness   Heart: Regular rhythm, no murmur, no rub or gallop, No JVD   Abdomen: Soft, TTP, diffusely. No rebound.non distended, no organomegaly, BS+.   Musculoskeletal: Normal strength/tone. No edema. No clubbing or cyanosis   CNS: AAOx3. Cranial nerves II-XII intact. Grossly normal.No NR   SKIN: No skin lesion or rash. Dry, warm, intact                 Labs:  Results:        Chemistry  Recent Labs         08/23/18   0831  08/22/18   0550  08/20/18   1557      GLU  88  88   --       NA  142  142   --       K  3.8  3.8  3.3*      CL  117*  119*   --       CO2  16*  18*   --       BUN  9  11   --       CREA  0.8  0.9   --       CA  7.4*  7.3*    --       AGAP  9  6   --               CBC w/Diff  Recent Labs         08/23/18   0831  08/22/18   0550      WBC  3.4*  3.2*      RBC  4.41  4.46      HGB  12.3*  12.3*      HCT  35.4*  35.8*      PLT  178  185              Microbiology  No results for input(s): CULT in the last 72 hours.  Imaging-        Results from Hospital Encounter encounter on 08/18/18     XR CHEST SNGL V           Narrative  INDICATION:   SOB         EXAMINATION:   XR CHEST SNGL V      COMPARISON:   02/14/2016      FINDINGS:   The study shows a normal sized heart.. The lungs are clear and well expanded.                    Impression  IMPRESSION:   Normal chest                Results from Hospital Encounter encounter on 08/06/18     CT ABD PELV WO CONT           Narrative  CT Abdomen and Pelvis without      Indication: Abdominal pain. Nausea, vomiting, diarrhea.      Comparison: 07/22/2018.      TECHNIQUE:    CT of the abdomen and pelvis WITHOUT intravenous contrast. Coronal and sagittal   reformations were obtained. Oral contrast was given.      All CT exams at this facility use one or more dose reduction techniques   including automatic exposure control, mA/kV adjustment per patient's size, or   iterative reconstruction technique. DICOM format imaged data is available to   non-affiliated external healthcare facilities or entities on a secure, media   free, reciprocal searchable basis with patient authorization for 12 months   following the date of the study.      DISCUSSION:      ABSENCE OF INTRAVENOUS CONTRAST DECREASES SENSITIVITY FOR DETECTION OF FOCAL   LESIONS AND VASCULAR PATHOLOGY.      LOWER THORAX: Normal.      HEPATOBILIARY: No focal hepatic lesions.  No biliary ductal dilatation.   SPLEEN: No splenomegaly.   PANCREAS: No focal masses or ductal dilatation.      ADRENALS: No adrenal nodules.   KIDNEYS/URETERS: No hydronephrosis, stones, or solid mass lesions.   PELVIC ORGANS/BLADDER: Unremarkable.      PERITONEUM /  RETROPERITONEUM: No free air or fluid.   LYMPH NODES: No lymphadenopathy.   VESSELS: Unremarkable.      GI TRACT: Multiple nondistended fluid-filled loops of small bowel with   questionable mild wall thickening. Fluid-filled colon with no significant wall   thickening. No obstruction. Normal appendix.      BONES AND SOFT TISSUES: No acute abnormality.              Impression  IMPRESSION:      Numerous fluid-filled loops of small and large bowel with borderline diffuse   small bowel wall thickening, concerning for infectious or inflammatory   enterocolitis. Findings have slightly progressed since 07/22/2018.              ---------------------------------------------------------------------------------------------------------------   I have independently examined the patient and reviewed all lab studies and imgaing as well as review of nursing notes and physican notes       33 min spent with > 50% of time spent counseling and coordination of care      Radford PaxSushma Izabel Chim, MD   08/23/2018      [facility]   [phone]

## 2018-08-24 LAB — BASIC METABOLIC PANEL
Anion Gap: 7 mmol/L (ref 5–15)
BUN: 8 mg/dl (ref 7–25)
CO2: 19 mEq/L — ABNORMAL LOW (ref 21–32)
Calcium: 7.5 mg/dl — ABNORMAL LOW (ref 8.5–10.1)
Chloride: 116 mEq/L — ABNORMAL HIGH (ref 98–107)
Creatinine: 1 mg/dl (ref 0.6–1.3)
EGFR IF NonAfrican American: >60
GFR African American: >60
Glucose: 87 mg/dl (ref 74–106)
Potassium: 4 mEq/L (ref 3.5–5.1)
Sodium: 142 mEq/L (ref 136–145)

## 2018-08-24 LAB — CBC
Hematocrit: 35.2 % — ABNORMAL LOW (ref 37.0–50.0)
Hemoglobin: 12.1 gm/dl — ABNORMAL LOW (ref 12.4–17.2)
MCH: 27.9 pg (ref 23.0–34.6)
MCHC: 34.4 gm/dl (ref 30.0–36.0)
MCV: 81.3 fL (ref 80.0–98.0)
MPV: 12.4 fL — ABNORMAL HIGH (ref 6.0–10.0)
Platelets: 197 10*3/uL (ref 140–450)
RBC: 4.33 M/uL (ref 3.80–5.70)
RDW-SD: 45.4 — ABNORMAL HIGH (ref 35.1–43.9)
WBC: 4.7 10*3/uL (ref 4.0–11.0)

## 2018-08-24 LAB — CBC W/O DIFF
HCT: 35.2 % — ABNORMAL LOW (ref 37.0–50.0)
HGB: 12.1 gm/dl — ABNORMAL LOW (ref 12.4–17.2)
MCH: 27.9 pg (ref 23.0–34.6)
MCHC: 34.4 gm/dl (ref 30.0–36.0)
MCV: 81.3 fL (ref 80.0–98.0)
MPV: 12.4 fL — ABNORMAL HIGH (ref 6.0–10.0)
PLATELET: 197 10*3/uL (ref 140–450)
RBC: 4.33 M/uL (ref 3.80–5.70)
RDW-SD: 45.4 — ABNORMAL HIGH (ref 35.1–43.9)
WBC: 4.7 10*3/uL (ref 4.0–11.0)

## 2018-08-24 LAB — METABOLIC PANEL, BASIC
Anion gap: 7 mmol/L (ref 5–15)
BUN: 8 mg/dl (ref 7–25)
CO2: 19 mEq/L — ABNORMAL LOW (ref 21–32)
Calcium: 7.5 mg/dl — ABNORMAL LOW (ref 8.5–10.1)
Chloride: 116 mEq/L — ABNORMAL HIGH (ref 98–107)
Creatinine: 1 mg/dl (ref 0.6–1.3)
GFR est AA: >60
GFR est non-AA: >60
Glucose: 87 mg/dl (ref 74–106)
Potassium: 4 mEq/L (ref 3.5–5.1)
Sodium: 142 mEq/L (ref 136–145)

## 2018-08-24 MED ORDER — D5-1/2 NS & POTASSIUM CHLORIDE 20 MEQ/L IV
20 mEq/L | INTRAVENOUS | Status: DC
Start: 2018-08-24 — End: 2018-08-26
  Administered 2018-08-24 – 2018-08-26 (×3): via INTRAVENOUS

## 2018-08-24 MED FILL — ONDANSETRON (PF) 4 MG/2 ML INJECTION: 4 mg/2 mL | INTRAMUSCULAR | Qty: 2

## 2018-08-24 MED FILL — OXYCODONE-ACETAMINOPHEN 5 MG-325 MG TAB: 5-325 mg | ORAL | Qty: 1

## 2018-08-24 MED FILL — SODIUM BICARBONATE 650 MG TAB: 650 mg | ORAL | Qty: 2

## 2018-08-24 MED FILL — TRIMETHOPRIM-SULFAMETHOXAZOLE 160 MG-800 MG TAB: 160-800 mg | ORAL | Qty: 1

## 2018-08-24 MED FILL — PROMETHAZINE IN NS 12.5 MG/50 ML IV PIGGY BAG: 12.5 mg/50 ml | INTRAVENOUS | Qty: 50

## 2018-08-24 MED FILL — LOVENOX 30 MG/0.3 ML SUB-Q SYRINGE: 30 mg/0.3 mL | SUBCUTANEOUS | Qty: 0.3

## 2018-08-24 MED FILL — BIKTARVY 50 MG-200 MG-25 MG TABLET: 50-200-25 mg | ORAL | Qty: 1

## 2018-08-24 MED FILL — D5-1/2 NS & POTASSIUM CHLORIDE 40 MEQ/L IV: 40 mEq/L | INTRAVENOUS | Qty: 1000

## 2018-08-24 MED FILL — FLUCONAZOLE IN SALINE (ISO-OSMOTIC) 200 MG/100 ML IV PIGGY BACK: 200 mg/100 mL | INTRAVENOUS | Qty: 100

## 2018-08-24 NOTE — Progress Notes (Signed)
Will cont to monitor for client stabilization at d/c and needs. Currently  Client was supposed to be getting in touch with [facility] . Will have to see what progress was made in that area

## 2018-08-24 NOTE — Progress Notes (Signed)
INFECTIOUS DISEASE PROGRESS NOTE       Requested by: Dr Ninetta Lights    Reason for consult: N/V/D in immunocopromised host    Date of admission: 08/18/2018    ABX:      Current abx Prior abx    Fluconazole 6/24     5  Bactrim prophylaxis 6/26  biktarvy started 6/26   nitazoxinide 6/13    11d  Flagyl 250 mg po bid                   2   x 7 d  Albendazole 400 mg bid  6/24   3       ASSESSMENT:        N/V/D/ dehydration    Uncontrolled parasitic infection of GI ( see below) likely-     Giardia should have responded but 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 in HIV- reconstitution of immune system with ART will help immensely     start ART as genomic testing reviewed-see below      initiate albendazol/flagyl  For rx of recalcitrant giardia-see below    Oral candidiasis and suspect esophagitis- improving      cryptosporidia/ Giardia concomitant infection   source unknown     D 11 of nitazoxinide PTA (see above)     6/24 now neg for giardia and persistent + cryptosporidia    Severe/constant nausea        HIV- new diagnosis    VL > 1 mil    cd4  17 abs/ 2%    hiv genomic testing for resistance sent 6/15- no resistance [ results available 6/26 ]  Started Biktarvy 6/26       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    ho neutropenia with adequate anc    nkda            RECOMMENDATIONS:    Severe nausea- hence flagyl d/ced and repeat giardia ag neg     -Expect cryptosporidia to resolve with immune reconstitution - started on Biktarvy 6/26 as genotype neg for any resistance  -will give imodium 4mg  now and 2mg  every 4 hr as needed for diarrhea   -D/w Dr Ninetta Lights, if his diarrhea is better on imodium, can use 3-4 times a day prn to control it as likely will take weeks before his CD4 count will come up. Can go home on imodium prn once diarrhea better controlled.  - also recommend high fiber , low fat , frequent small meals. Avoid  caffeine and lactose [milk product] - d/w pt again  - Rehydration per IM  - Agree with fluconazole- cont till CD4 above atleast 100, ideally recommended till CD4 around 200  - Hold azithro for now- if no nausea, restart next week  - cont pcp prophylaxis w/ bactrim  - Cont Biktarvy  - f/up EVMS ID post discharge    - prior to dc, need to have nausea resolved or controlled and imodium scheduled to conttol diarrhea- till resolved with reconstitution- which takes weeks/few months         MICROBIOLOGY:       PTA  5/27  Neg c diff  6/2    Stool culture neg  6/12  + for BOTH giardia and cryptosporidia-- lab repeated test with same results    Currrent  6/24  Stool neg CDI           Stool cullture IP           Crypto  ag positive           Giardia ag neg     cc   Pt c/o still diarrhea , 2 episodes of vomiting over past 24 hrs??. Diarrhea has improved but still not being recorded.  Current Medications:     Current Facility-Administered Medications   Medication Dose Route Frequency   ??? loperamide (IMODIUM) capsule 2 mg  2 mg Oral Q4H PRN   ??? bictegrav-emtricit-tenofov ala (BIKTARVY) 50-200-25 mg tablet 1 Tab  1 Tab Oral DAILY   ??? ondansetron (ZOFRAN) injection 4 mg  4 mg IntraVENous Q6H   ??? promethazine (PHENERGAN) 12.5 mg in NS 50 mL IVPB  12.5 mg IntraVENous Q6H PRN   ??? oxyCODONE-acetaminophen (PERCOCET) 5-325 mg per tablet 1 Tab  1 Tab Oral Q6H PRN   ??? dextrose 5% - 0.45% NaCl with KCl 40 mEq/L infusion   IntraVENous CONTINUOUS   ??? acetaminophen (TYLENOL) tablet 650 mg  650 mg Oral Q6H PRN   ??? albuterol-ipratropium (DUO-NEB) 2.5 MG-0.5 MG/3 ML  3 mL Nebulization Q6H PRN   ??? naloxone (NARCAN) injection 0.1 mg  0.1 mg IntraVENous PRN   ??? enoxaparin (LOVENOX) injection 30 mg  30 mg SubCUTAneous Q24H   ??? fluconazole (DIFLUCAN) 200mg /100 mL IVPB (premix)  200 mg IntraVENous Q24H   ??? sodium bicarbonate tablet 1,300 mg  1,300 mg Oral TID   ??? trimethoprim-sulfamethoxazole (BACTRIM DS, SEPTRA DS) 160-800 mg per  tablet 1 Tab  1 Tab Oral Q MON, WED & FRI         Physical Exam:  Vitals  Temp (24hrs), Avg:97.7 ??F (36.5 ??C), Min:97.2 ??F (36.2 ??C), Max:98.1 ??F (36.7 ??C)    Visit Vitals  BP (!) 141/92 (BP 1 Location: Right arm, BP Patient Position: Supine)   Pulse 88   Temp 97.5 ??F (36.4 ??C)   Resp 16   Ht 5\' 9"  (1.753 m)   Wt 66.4 kg (146 lb 6.2 oz)   SpO2 100%   BMI 21.62 kg/m??       General: Well-developed, 28 y.o. year-old, male, in no acute distress  HEENT: Normocephalic, anicteric sclerae, Pupils equal, round reactive to light, no oropharyngeal lesions. Thrush improved  NChest: Symmetrical expansion  Abdomen: Soft, TTP, diffusely. No rebound.non distended, no organomegaly, BS+.  Musculoskeletal: Normal strength/tone. No edema. No clubbing or cyanosis  CNS: AAOx3. Cranial nerves II-XII intact. Grossly normal.No NR  SKIN: No skin lesion or rash. Dry, warm, intact          Labs: Results:   Chemistry Recent Labs     08/24/18  0626 08/23/18  0831 08/22/18  0550   GLU 87 88 88   NA 142 142 142   K 4.0 3.8 3.8   CL 116* 117* 119*   CO2 19* 16* 18*   BUN 8 9 11    CREA 1.0 0.8 0.9   CA 7.5* 7.4* 7.3*   AGAP 7 9 6       CBC w/Diff Recent Labs     08/24/18  0626 08/23/18  0831 08/22/18  0550   WBC 4.7 3.4* 3.2*   RBC 4.33 4.41 4.46   HGB 12.1* 12.3* 12.3*   HCT 35.2* 35.4* 35.8*   PLT 197 178 185      Microbiology No results for input(s): CULT in the last 72 hours.       Imaging-    Results from Hospital Encounter encounter on 08/18/18   XR CHEST SNGL V    Narrative  INDICATION:  SOB       EXAMINATION:  XR CHEST SNGL V    COMPARISON:  02/14/2016    FINDINGS:  The study shows a normal sized heart.. The lungs are clear and well expanded.           Impression IMPRESSION:  Normal chest         Results from Hospital Encounter encounter on 08/06/18   CT ABD PELV WO CONT    Narrative CT Abdomen and Pelvis without    Indication: Abdominal pain. Nausea, vomiting, diarrhea.    Comparison: 07/22/2018.    TECHNIQUE:    CT of the abdomen and pelvis WITHOUT intravenous contrast. Coronal and sagittal  reformations were obtained. Oral contrast was given.    All CT exams at this facility use one or more dose reduction techniques  including automatic exposure control, mA/kV adjustment per patient's size, or  iterative reconstruction technique. DICOM format imaged data is available to  non-affiliated external healthcare facilities or entities on a secure, media  free, reciprocal searchable basis with patient authorization for 12 months  following the date of the study.    DISCUSSION:    ABSENCE OF INTRAVENOUS CONTRAST DECREASES SENSITIVITY FOR DETECTION OF FOCAL  LESIONS AND VASCULAR PATHOLOGY.    LOWER THORAX: Normal.    HEPATOBILIARY: No focal hepatic lesions.  No biliary ductal dilatation.  SPLEEN: No splenomegaly.  PANCREAS: No focal masses or ductal dilatation.    ADRENALS: No adrenal nodules.  KIDNEYS/URETERS: No hydronephrosis, stones, or solid mass lesions.  PELVIC ORGANS/BLADDER: Unremarkable.    PERITONEUM / RETROPERITONEUM: No free air or fluid.  LYMPH NODES: No lymphadenopathy.  VESSELS: Unremarkable.    GI TRACT: Multiple nondistended fluid-filled loops of small bowel with  questionable mild wall thickening. Fluid-filled colon with no significant wall  thickening. No obstruction. Normal appendix.    BONES AND SOFT TISSUES: No acute abnormality.      Impression IMPRESSION:    Numerous fluid-filled loops of small and large bowel with borderline diffuse  small bowel wall thickening, concerning for infectious or inflammatory  enterocolitis. Findings have slightly progressed since 07/22/2018.         ---------------------------------------------------------------------------------------------------------------  I have independently examined the patient and reviewed all lab studies and imgaing as well as review of nursing notes and physican notes     33 min spent with > 50% of time spent counseling and coordination of care     Alfonso EllisMartha Daine Gunther, MD  08/24/2018    [facility]  [phone]

## 2018-08-24 NOTE — Other (Signed)
Bedside and Verbal shift change report given to Lija Philip (oncoming nurse) by Eze Everest RN (offgoing nurse). Report included the following information SBAR, Kardex and MAR.

## 2018-08-24 NOTE — Progress Notes (Signed)
Problem: Pain  Goal: *Control of Pain  Outcome: Progressing Towards Goal     Problem: Falls - Risk of  Goal: *Absence of Falls  Description: Document Schmid Fall Risk and appropriate interventions in the flowsheet.  Outcome: Progressing Towards Goal  Note: Fall Risk Interventions:  Mobility Interventions: Communicate number of staff needed for ambulation/transfer, Patient to call before getting OOB         Medication Interventions: Bed/chair exit alarm, Patient to call before getting OOB    Elimination Interventions: Call light in reach    History of Falls Interventions: Door open when patient unattended, Vital signs minimum Q4HRs X 24 hrs (comment for end date)         Problem: Patient Education: Go to Patient Education Activity  Goal: Patient/Family Education  Outcome: Progressing Towards Goal

## 2018-08-24 NOTE — Progress Notes (Signed)
INTERNAL MEDICINE PROGRESS NOTE    Date of note:      August 24, 2018    Patient:               Travis [name], 28 y.o., male  Admit Date:        08/18/2018  Length of Stay:  5 day(s)    Problem List:   Patient Active Problem List   Diagnosis Code   ??? AKI (acute kidney injury) (HCC) N17.9   ??? Dehydration E86.0   ??? Enterocolitis K52.9   ??? Intractable nausea and vomiting R11.2   ??? Diarrhea R19.7   ??? Metabolic acidosis E87.2   ??? HIV (human immunodeficiency virus infection) (HCC) B20       Subjective + interval history     Still feels weak and tired but diarrhea seems to be slowing down.  Had 4 bowel movements yesterday.  This morning, had 2 BMs.  Volume is decreasing.  Nausea is under control.    Assessment:   ??  1. Nausea, vomiting, dehydration  2. 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 Acute kidney injury present on admission  4. Metabolic acidosis???urine anion gap negative. ??Likely metabolic acidosis from GI losses.  5. Oral candidiasis  6. 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  ??  Plan:   ??  Continue [name] hydration and potassium replacement till oral intake improves.  AKI has resolved  ??  ????Continue sodium bicarb tablet.  ??????  Flagyl stopped due to severe nausea, albendazole stopped on 6/27 due to ineffectiveness; azithromycin on hold until nausea improves  ??  Patient was started on Biktarvy on 6/26. Hopefully once his CD4 count improves, he will be able to get rid of diarrhea  ??  Continue PRN Lomotil.   ????  Continue??[name] fluconazole for oropharyngeal candidiasis  ??  Bactrim 3 times a week for PCP prophylaxis  ??  Lovenox and ambulation for DVT prophylaxis.  Patient refuses Lovenox on and off but he has been encouraged to ambulate frequently and exercises legs while he is in bed  ??  Continue nutritional supplements  ??  Continue current antiemetic regimen.   ??    ??  - Code status:??Full code  ??   Recommend to continue hospitalization.  Expected date of discharge:??TBD  Plan for disposition -??tbd  ??    Objective:       Visit Vitals  BP 134/81 (BP 1 Location: Right arm, BP Patient Position: Supine)   Pulse 73   Temp 97.7 ??F (36.5 ??C)   Resp 16   Ht 5\' 9"  (1.753 m)   Wt 66.4 kg (146 lb 6.2 oz)   SpO2 100%   BMI 21.62 kg/m??           Intake/Output Summary (Last 24 hours) at 08/24/2018 0703  Last data filed at 08/24/2018 [id number]  Gross per 24 hour   Intake 1941.25 ml   Output ???   Net 1941.25 ml       Physical Exam:   GEN -alert and oriented x3, not in distress  HEENT - mucous membranes??moist; oral candidiasis is improving  Neck - supple, no JVD  Cardiac - RRR, S1, S2, no murmurs  Chest/Lungs - clear to auscultation without wheezes or rhonchi  Abdomen - soft,??mild tenderness without guarding or rigidity  Extremities - no clubbing/ cyanosis/ edema  Neuro - CN 2-12 intact.  No focal deficits. No motor or sensory deficit appreciated.   Skin - no rashes or lesions  ??    Current medications:       Current Facility-Administered Medications:   ???  [COMPLETED] loperamide (IMODIUM) capsule 4 mg, 4 mg, Oral, ONCE, 4 mg at 08/23/18 1425 **FOLLOWED BY** loperamide (IMODIUM) capsule 2 mg, 2 mg, Oral, Q4H PRN, Radford PaxSingh, Sushma, MD  ???  bictegrav-emtricit-tenofov ala (BIKTARVY) 50-200-25 mg tablet 1 Tab, 1 Tab, Oral, DAILY, Alfonso EllisMooney, Martha, MD, 1 Tab at 08/23/18 1111  ???  ondansetron (ZOFRAN) injection 4 mg, 4 mg, IntraVENous, Q6H, Tanisia Yokley S, MD, 4 mg at 08/24/18 0557  ???  promethazine (PHENERGAN) 12.5 mg in NS 50 mL IVPB, 12.5 mg, IntraVENous, Q6H PRN, Makaela Cando S, MD, Last Rate: 200 mL/hr at 08/23/18 2146, 12.5 mg at 08/23/18 2146  ???  oxyCODONE-acetaminophen (PERCOCET) 5-325 mg per tablet 1 Tab, 1 Tab, Oral, Q6H PRN, Jeaneen Cala S, MD, 1 Tab at 08/23/18 2359  ???  dextrose 5% - 0.45% NaCl with KCl 40 mEq/L infusion, , IntraVENous, CONTINUOUS, Taiyana Kissler S, MD, Last Rate: 75 mL/hr at 08/23/18 2312   ???  acetaminophen (TYLENOL) tablet 650 mg, 650 mg, Oral, Q6H PRN, Deaglan Lile S, MD  ???  albuterol-ipratropium (DUO-NEB) 2.5 MG-0.5 MG/3 ML, 3 mL, Nebulization, Q6H PRN, Aylanie Cubillos, Reina Fuseuchita S, MD  ???  naloxone (NARCAN) injection 0.1 mg, 0.1 mg, IntraVENous, PRN, Nollie Shiflett, Reina Fuseuchita S, MD  ???  enoxaparin (LOVENOX) injection 30 mg, 30 mg, SubCUTAneous, Q24H, Aleksia Freiman S, MD, 30 mg at 08/20/18 1138  ???  fluconazole (DIFLUCAN) 200mg /100 mL IVPB (premix), 200 mg, IntraVENous, Q24H, Raine Elsass S, MD, Last Rate: 100 mL/hr at 08/23/18 1008, 200 mg at 08/23/18 1008  ???  sodium bicarbonate tablet 1,300 mg, 1,300 mg, Oral, TID, Avaiah Stempel S, MD, 1,300 mg at 08/23/18 2146  ???  trimethoprim-sulfamethoxazole (BACTRIM DS, SEPTRA DS) 160-800 mg per tablet 1 Tab, 1 Tab, Oral, Q MON, WED & Gita KudoFRI, Mooney, Martha, MD, 1 Tab at 08/21/18 16100838    Labs:     Recent Results (from the past 24 hour(s))   CBC W/O DIFF    Collection Time: 08/23/18  8:31 AM   Result Value Ref Range    WBC 3.4 (L) 4.0 - 11.0 1000/mm3    RBC 4.41 3.80 - 5.70 M/uL    HGB 12.3 (L) 12.4 - 17.2 gm/dl    HCT 96.035.4 (L) 45.437.0 - 50.0 %    MCV 80.3 80.0 - 98.0 fL    MCH 27.9 23.0 - 34.6 pg    MCHC 34.7 30.0 - 36.0 gm/dl    PLATELET 098178 119140 - 147450 1000/mm3    MPV 12.3 (H) 6.0 - 10.0 fL    RDW-SD 44.6 (H) 35.1 - 43.9     METABOLIC PANEL, BASIC    Collection Time: 08/23/18  8:31 AM   Result Value Ref Range    Sodium 142 136 - 145 mEq/L    Potassium 3.8 3.5 - 5.1 mEq/L    Chloride 117 (H) 98 - 107 mEq/L    CO2 16 (L) 21 - 32 mEq/L    Glucose 88 74 - 106 mg/dl    BUN 9 7 - 25 mg/dl    Creatinine 0.8 0.6 - 1.3 mg/dl    GFR est AA >82>60      GFR est non-AA >60      Calcium 7.4 (L) 8.5 - 10.1 mg/dl    Anion gap 9 5 -  15 mmol/L   CBC W/O DIFF    Collection Time: 08/24/18  6:26 AM   Result Value Ref Range    WBC 4.7 4.0 - 11.0 1000/mm3    RBC 4.33 3.80 - 5.70 M/uL    HGB 12.1 (L) 12.4 - 17.2 gm/dl    HCT 16.135.2 (L) 09.637.0 - 50.0 %    MCV 81.3 80.0 - 98.0 fL    MCH 27.9 23.0 - 34.6 pg     MCHC 34.4 30.0 - 36.0 gm/dl    PLATELET 045197 409140 - 811450 1000/mm3    MPV 12.4 (H) 6.0 - 10.0 fL    RDW-SD 45.4 (H) 35.1 - 43.9         XR Results:  Results from Hospital Encounter encounter on 08/18/18   XR CHEST SNGL V    Narrative INDICATION:  SOB       EXAMINATION:  XR CHEST SNGL V    COMPARISON:  02/14/2016    FINDINGS:  The study shows a normal sized heart.. The lungs are clear and well expanded.           Impression IMPRESSION:  Normal chest         CT Results:  Results from Hospital Encounter encounter on 08/06/18   CT ABD PELV WO CONT    Narrative CT Abdomen and Pelvis without    Indication: Abdominal pain. Nausea, vomiting, diarrhea.    Comparison: 07/22/2018.    TECHNIQUE:   CT of the abdomen and pelvis WITHOUT intravenous contrast. Coronal and sagittal  reformations were obtained. Oral contrast was given.    All CT exams at this facility use one or more dose reduction techniques  including automatic exposure control, mA/kV adjustment per patient's size, or  iterative reconstruction technique. DICOM format imaged data is available to  non-affiliated external healthcare facilities or entities on a secure, media  free, reciprocal searchable basis with patient authorization for 12 months  following the date of the study.    DISCUSSION:    ABSENCE OF INTRAVENOUS CONTRAST DECREASES SENSITIVITY FOR DETECTION OF FOCAL  LESIONS AND VASCULAR PATHOLOGY.    LOWER THORAX: Normal.    HEPATOBILIARY: No focal hepatic lesions.  No biliary ductal dilatation.  SPLEEN: No splenomegaly.  PANCREAS: No focal masses or ductal dilatation.    ADRENALS: No adrenal nodules.  KIDNEYS/URETERS: No hydronephrosis, stones, or solid mass lesions.  PELVIC ORGANS/BLADDER: Unremarkable.    PERITONEUM / RETROPERITONEUM: No free air or fluid.  LYMPH NODES: No lymphadenopathy.  VESSELS: Unremarkable.    GI TRACT: Multiple nondistended fluid-filled loops of small bowel with  questionable mild wall thickening. Fluid-filled colon with no significant  wall  thickening. No obstruction. Normal appendix.    BONES AND SOFT TISSUES: No acute abnormality.      Impression IMPRESSION:    Numerous fluid-filled loops of small and large bowel with borderline diffuse  small bowel wall thickening, concerning for infectious or inflammatory  enterocolitis. Findings have slightly progressed since 07/22/2018.         MRI Results:  No results found for this or any previous visit.    Nuclear Medicine Results:  No results found for this or any previous visit.    US Results:  Results from Hospital Encounter encounter on 07/21/18   US RUQ    Narrative Clinical history: Elevated liver function studies    EXAMINATION:  Upper quadrant ultrasound 07/28/2018    FINDINGS:  Liver demonstrates homogeneous echotexture. No intrahepatic bile duct  dilatation. Bile  duct measures 4 mm. No stones are seen in the gallbladder.  Gallbladder wall measures 2 mm. Sonographic Percell Miller sign is negative.    Pancreas, proximal aorta and IVC are within normal limits. Right kidney measures  10.4 x 5.6 x 3.7 cm. Normal corticomedullary differentiation. No hydronephrosis.  Small amounts of perihepatic ascites.      Impression IMPRESSION:  Small amounts of perihepatic ascites.         IR Results:  No results found for this or any previous visit.    VAS/US Results:  No results found for this or any previous visit.      Total clinical care time was 32 minutes of which more than 50% was spent in coordination of care and counseling (time spent with patient/family face to face, physical exam, reviewing laboratory and imaging investigations, speaking with physicians and nursing staff involved in this patient's care).     Perfecto Kingdom,  MD  [facility]  August 24, 2018  Time: 7:03 AM

## 2018-08-24 NOTE — Progress Notes (Signed)
Will cont to monitor for client stabilization at d/c and needs. Currently  Client was supposed to be getting in touch with [facility] . Will have to see what progress was made in that area

## 2018-08-24 NOTE — Progress Notes (Signed)
Progress Notes by Salley SlaughterSimoes, Mako Pelfrey S, MD at 08/24/18 0703                Author: Salley SlaughterSimoes, Stan Cantave S, MD  Service: Hospitalist  Author Type: Physician       Filed: 08/24/18 1632  Date of Service: 08/24/18 0703  Status: Signed          Editor: Salley SlaughterSimoes, Carr Shartzer S, MD (Physician)                          INTERNAL MEDICINE PROGRESS NOTE      Date of note:      August 24, 2018      Patient:               Travis [name],  28 y.o., male   Admit Date:        08/18/2018   Length of Stay:  5 day(s)      Problem List:      Patient Active Problem List        Diagnosis  Code         ?  AKI (acute kidney injury) (HCC)  N17.9     ?  Dehydration  E86.0     ?  Enterocolitis  K52.9     ?  Intractable nausea and vomiting  R11.2     ?  Diarrhea  R19.7     ?  Metabolic acidosis  E87.2         ?  HIV (human immunodeficiency virus infection) (HCC)  B20             Subjective + interval history        Still feels weak and tired but diarrhea seems to be slowing down.  Had 4 bowel movements yesterday.  This morning, had 2 BMs.  Volume is decreasing.  Nausea is under control.        Assessment:     ??   1.  Nausea, vomiting, dehydration   2.  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  Acute kidney injury present on admission   4.  Metabolic acidosis-urine anion gap negative. ??Likely metabolic acidosis from GI losses.   5.  Oral candidiasis   6.  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  Severe protein calorie malnutrition   8.  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   ??     Plan:     ??   Continue [name] hydration and potassium replacement till oral intake improves.  AKI has resolved   ??   ????Continue sodium bicarb tablet.   ??????   Flagyl stopped due to severe nausea, albendazole stopped on 6/27 due to ineffectiveness; azithromycin on hold until nausea improves   ??   Patient was started on Biktarvy on 6/26. Hopefully once his CD4 count improves, he will be able to get rid of diarrhea   ??   Continue PRN Lomotil.    ????    Continue??[name] fluconazole for oropharyngeal candidiasis   ??   Bactrim 3 times a week for PCP prophylaxis   ??   Lovenox and ambulation for DVT prophylaxis.  Patient refuses Lovenox on and off but he has been encouraged to ambulate frequently and exercises legs while he is in bed   ??   Continue nutritional supplements   ??   Continue current antiemetic regimen.    ??      ??   -  Code status:??Full code   ??   Recommend to continue hospitalization.   Expected date of discharge:??TBD   Plan for disposition -??tbd   ??        Objective:           Visit Vitals      BP  134/81 (BP 1 Location: Right arm, BP Patient Position: Supine)     Pulse  73     Temp  97.7 ??F (36.5 ??C)     Resp  16     Ht   (1.753 m)     Wt  66.4 kg (146 lb 6.2 oz)     SpO2  100%        BMI  21.62 kg/m??                 Intake/Output Summary (Last 24 hours) at 08/24/2018 0703   Last data filed at 08/24/2018 9604     Gross per 24 hour        Intake  1941.25 ml        Output  --        Net  1941.25 ml             Physical Exam:     GEN -alert and oriented x3, not in distress   HEENT - mucous membranes??moist; oral candidiasis is improving   Neck - supple, no JVD   Cardiac - RRR, S1, S2, no murmurs   Chest/Lungs - clear to auscultation without wheezes or rhonchi   Abdomen - soft,??mild tenderness without guarding or rigidity   Extremities - no clubbing/ cyanosis/ edema   Neuro - CN 2-12 intact. No focal deficits. No motor or sensory deficit appreciated.    Skin - no rashes or lesions   ??        Current medications:           Current Facility-Administered Medications:    ?  [COMPLETED] loperamide (IMODIUM) capsule 4 mg, 4 mg, Oral, ONCE, 4 mg at 08/23/18 1425 **FOLLOWED BY** loperamide (IMODIUM) capsule 2 mg, 2 mg, Oral, Q4H PRN, Radford Pax, MD   ?  bictegrav-emtricit-tenofov ala (BIKTARVY) 50-200-25 mg tablet 1 Tab, 1 Tab, Oral, DAILY, Alfonso Ellis, MD, 1 Tab at 08/23/18 1111   ?  ondansetron (ZOFRAN) injection 4 mg, 4 mg, IntraVENous, Q6H, Williette Loewe S,  MD, 4 mg at 08/24/18 0557   ?  promethazine (PHENERGAN) 12.5 mg in NS 50 mL IVPB, 12.5 mg, IntraVENous, Q6H PRN, Garielle Mroz S, MD, Last Rate: 200 mL/hr at 08/23/18 2146, 12.5 mg at 08/23/18 2146   ?  oxyCODONE-acetaminophen (PERCOCET) 5-325 mg per tablet 1 Tab, 1 Tab, Oral, Q6H PRN, Sianne Tejada S, MD, 1 Tab at 08/23/18 2359   ?  dextrose 5% - 0.45% NaCl with KCl 40 mEq/L infusion, , IntraVENous, CONTINUOUS, Darrel Gloss S, MD, Last Rate: 75 mL/hr at 08/23/18 2312   ?  acetaminophen (TYLENOL) tablet 650 mg, 650 mg, Oral, Q6H PRN, Sydny Schnitzler S, MD   ?  albuterol-ipratropium (DUO-NEB) 2.5 MG-0.5 MG/3 ML, 3 mL, Nebulization, Q6H PRN, Giorgio Chabot S, MD   ?  naloxone (NARCAN) injection 0.1 mg, 0.1 mg, IntraVENous, PRN, Bryanne Riquelme S, MD   ?  enoxaparin (LOVENOX) injection 30 mg, 30 mg, SubCUTAneous, Q24H, Mayukha Symmonds S, MD, 30 mg at 08/20/18 1138   ?  fluconazole (DIFLUCAN) /100 mL IVPB (premix), 200 mg, IntraVENous, Q24H, Jauan Wohl S, MD, Last Rate: 100 mL/hr at  08/23/18 1008, 200 mg at 08/23/18 1008   ?  sodium bicarbonate tablet 1,300 mg, 1,300 mg, Oral, TID, Taletha Twiford S, MD, 1,300 mg at 08/23/18 2146   ?  trimethoprim-sulfamethoxazole (BACTRIM DS, SEPTRA DS) 160-800 mg per tablet 1 Tab, 1 Tab, Oral, Q MON, WED & Gita KudoFRI, Mooney, Martha, MD, 1 Tab at 08/21/18 16100838        Labs:          Recent Results (from the past 24 hour(s))     CBC W/O DIFF          Collection Time: 08/23/18  8:31 AM         Result  Value  Ref Range            WBC  3.4 (L)  4.0 - 11.0 1000/mm3       RBC  4.41  3.80 - 5.70 M/uL       HGB  12.3 (L)  12.4 - 17.2 gm/dl       HCT  96.035.4 (L)  45.437.0 - 50.0 %       MCV  80.3  80.0 - 98.0 fL       MCH  27.9  23.0 - 34.6 pg       MCHC  34.7  30.0 - 36.0 gm/dl       PLATELET  098178  119140 - 450 1000/mm3       MPV  12.3 (H)  6.0 - 10.0 fL       RDW-SD  44.6 (H)  35.1 - 43.9         METABOLIC PANEL, BASIC          Collection Time: 08/23/18  8:31 AM         Result  Value  Ref  Range            Sodium  142  136 - 145 mEq/L       Potassium  3.8  3.5 - 5.1 mEq/L       Chloride  117 (H)  98 - 107 mEq/L       CO2  16 (L)  21 - 32 mEq/L       Glucose  88  74 - 106 mg/dl       BUN  9  7 - 25 mg/dl       Creatinine  0.8  0.6 - 1.3 mg/dl       GFR est AA  >14>60          GFR est non-AA  >60          Calcium  7.4 (L)  8.5 - 10.1 mg/dl       Anion gap  9  5 - 15 mmol/L       CBC W/O DIFF          Collection Time: 08/24/18  6:26 AM         Result  Value  Ref Range            WBC  4.7  4.0 - 11.0 1000/mm3       RBC  4.33  3.80 - 5.70 M/uL       HGB  12.1 (L)  12.4 - 17.2 gm/dl       HCT  78.235.2 (L)  95.637.0 - 50.0 %       MCV  81.3  80.0 - 98.0 fL       MCH  27.9  23.0 -  34.6 pg       MCHC  34.4  30.0 - 36.0 gm/dl       PLATELET  161197  096140 - 450 1000/mm3       MPV  12.4 (H)  6.0 - 10.0 fL            RDW-SD  45.4 (H)  35.1 - 43.9             XR Results:     Results from Hospital Encounter encounter on 08/18/18     XR CHEST SNGL V           Narrative  INDICATION:   SOB         EXAMINATION:   XR CHEST SNGL V      COMPARISON:   02/14/2016      FINDINGS:   The study shows a normal sized heart.. The lungs are clear and well expanded.                    Impression  IMPRESSION:   Normal chest              CT Results:     Results from Hospital Encounter encounter on 08/06/18     CT ABD PELV WO CONT           Narrative  CT Abdomen and Pelvis without      Indication: Abdominal pain. Nausea, vomiting, diarrhea.      Comparison: 07/22/2018.      TECHNIQUE:    CT of the abdomen and pelvis WITHOUT intravenous contrast. Coronal and sagittal   reformations were obtained. Oral contrast was given.      All CT exams at this facility use one or more dose reduction techniques   including automatic exposure control, mA/kV adjustment per patient's size, or   iterative reconstruction technique. DICOM format imaged data is available to   non-affiliated external healthcare facilities or entities on a secure, media   free, reciprocal  searchable basis with patient authorization for 12 months   following the date of the study.      DISCUSSION:      ABSENCE OF INTRAVENOUS CONTRAST DECREASES SENSITIVITY FOR DETECTION OF FOCAL   LESIONS AND VASCULAR PATHOLOGY.      LOWER THORAX: Normal.      HEPATOBILIARY: No focal hepatic lesions.  No biliary ductal dilatation.   SPLEEN: No splenomegaly.   PANCREAS: No focal masses or ductal dilatation.      ADRENALS: No adrenal nodules.   KIDNEYS/URETERS: No hydronephrosis, stones, or solid mass lesions.   PELVIC ORGANS/BLADDER: Unremarkable.      PERITONEUM / RETROPERITONEUM: No free air or fluid.   LYMPH NODES: No lymphadenopathy.   VESSELS: Unremarkable.      GI TRACT: Multiple nondistended fluid-filled loops of small bowel with   questionable mild wall thickening. Fluid-filled colon with no significant wall   thickening. No obstruction. Normal appendix.      BONES AND SOFT TISSUES: No acute abnormality.              Impression  IMPRESSION:      Numerous fluid-filled loops of small and large bowel with borderline diffuse   small bowel wall thickening, concerning for infectious or inflammatory   enterocolitis. Findings have slightly progressed since 07/22/2018.              MRI Results:   No results found for this or any previous visit.  Nuclear Medicine Results:   No results found for this or any previous visit.      [place] Results:     Results from [facility] encounter on 07/21/18     [place] RUQ           Narrative  Clinical history: Elevated liver function studies      EXAMINATION:   Upper quadrant ultrasound 07/28/2018      FINDINGS:   Liver demonstrates homogeneous echotexture. No intrahepatic bile duct   dilatation. Bile duct measures 4 mm. No stones are seen in the gallbladder.   Gallbladder wall measures 2 mm. Sonographic Percell Miller sign is negative.      Pancreas, proximal aorta and IVC are within normal limits. Right kidney measures   10.4 x 5.6 x 3.7 cm. Normal corticomedullary differentiation. No  hydronephrosis.   Small amounts of perihepatic ascites.              Impression  IMPRESSION:   Small amounts of perihepatic ascites.              IR Results:   No results found for this or any previous visit.      VAS/US Results:   No results found for this or any previous visit.         Total clinical care time was 32 minutes of which more than 50% was spent in coordination of care and counseling (time spent with patient/family face to face, physical exam, reviewing laboratory and imaging investigations, speaking with physicians and  nursing staff involved in this patient's care).       Perfecto Kingdom,  MD   [facility]   August 24, 2018   Time: 7:03 AM

## 2018-08-24 NOTE — Progress Notes (Signed)
Problem: Pain  Goal: *Control of Pain  Outcome: Progressing Towards Goal     Problem: Falls - Risk of  Goal: *Absence of Falls  Description: Document Schmid Fall Risk and appropriate interventions in the flowsheet.  Outcome: Progressing Towards Goal  Note: Fall Risk Interventions:  Mobility Interventions: Communicate number of staff needed for ambulation/transfer, Patient to call before getting OOB         Medication Interventions: Bed/chair exit alarm, Patient to call before getting OOB    Elimination Interventions: Call light in reach    History of Falls Interventions: Door open when patient unattended, Vital signs minimum Q4HRs X 24 hrs (comment for end date)         Problem: Patient Education: Go to Patient Education Activity  Goal: Patient/Family Education  Outcome: Progressing Towards Goal

## 2018-08-24 NOTE — Progress Notes (Signed)
Progress  Notes by Alfonso EllisMooney, Scherrie Seneca, MD at 08/24/18 [phone]                Author: Alfonso EllisMooney, Alexis Mizuno, MD  Service: Infectious Disease  Author Type: Physician       Filed: 08/24/18 1600  Date of Service: 08/24/18 0947  Status: Signed          Editor: Alfonso EllisMooney, Sircharles Holzheimer, MD (Physician)                                                                                       INFECTIOUS DISEASE PROGRESS NOTE          Requested by: Dr Sandie AnoSimoes      Reason for consult: N/V/D in immunocopromised host      Date of admission: 08/18/2018        ABX:            Current abx  Prior abx      Fluconazole 6/24     5   Bactrim prophylaxis 6/26   biktarvy started 6/26    nitazoxinide 6/13    11d   Flagyl 250 mg po bid                   2   x 7 d   Albendazole 400 mg bid  6/24   3            ASSESSMENT:             N/V/D/ dehydration     Uncontrolled parasitic infection of GI ( see below) likely-      Giardia should have responded but 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 in HIV- reconstitution of immune system with ART will help immensely      start ART as genomic testing reviewed-see below       initiate albendazol/flagyl  For rx of recalcitrant giardia-see below      Oral candidiasis and suspect esophagitis - improving         cryptosporidia/ Giardia concomitant infection    source unknown      D 11 of nitazoxinide PTA (see above)      6/24 now neg for giardia and persistent + cryptosporidia     Severe/constant nausea           HIV- new diagnosis     VL > 1 mil     cd4  17 abs/ 2%     hiv genomic testing for resistance sent 6/15- no resistance [ results available 6/26 ]   Started Biktarvy 6/26          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      ho neutropenia with adequate anc      nkda                   RECOMMENDATIONS:      Severe nausea- hence flagyl d/ced and repeat giardia ag neg       -Expect cryptosporidia to resolve with immune reconstitution - started on Biktarvy 6/26 as genotype neg for any resistance   -will give imodium 4mg  now and  2mg  every 4 hr  as needed for diarrhea    -D/w Dr Ninetta Lights, if his diarrhea is better on imodium, can use 3-4 times a day prn to control it as likely will take weeks before his CD4 count will come up. Can go home on imodium prn once diarrhea better controlled.   - also recommend high fiber , low fat , frequent small meals. Avoid caffeine and lactose [milk product] - d/w pt again   - Rehydration per IM   - Agree with fluconazole- cont till CD4 above atleast 100, ideally recommended till CD4 around 200   - Hold azithro for now- if no nausea, restart next week   - cont pcp prophylaxis w/ bactrim   - Cont Biktarvy   - f/up EVMS ID post discharge     - prior to dc, need to have nausea resolved or controlled and imodium scheduled to conttol diarrhea- till resolved with reconstitution- which takes weeks/few months             MICROBIOLOGY:          PTA   5/27  Neg c diff   6/2    Stool culture neg   6/12  + for BOTH giardia and cryptosporidia-- lab repeated test with same results      Currrent   6/24  Stool neg CDI            Stool cullture IP            Crypto ag positive            Giardia ag neg         cc     Pt c/o still diarrhea , 2 episodes of vomiting over past 24 hrs??. Diarrhea has improved but still not being recorded.     Current Medications:          Current Facility-Administered Medications          Medication  Dose  Route  Frequency           ?  loperamide (IMODIUM) capsule 2 mg   2 mg  Oral  Q4H PRN     ?  bictegrav-emtricit-tenofov ala (BIKTARVY) 50-200-25 mg tablet 1 Tab   1 Tab  Oral  DAILY     ?  ondansetron (ZOFRAN) injection 4 mg   4 mg  IntraVENous  Q6H     ?  promethazine (PHENERGAN) 12.5 mg in NS 50 mL IVPB   12.5 mg  IntraVENous  Q6H PRN     ?  oxyCODONE-acetaminophen (PERCOCET) 5-325 mg per tablet 1 Tab   1 Tab  Oral  Q6H PRN     ?  dextrose 5% - 0.45% NaCl with KCl 40 mEq/L infusion     IntraVENous  CONTINUOUS     ?  acetaminophen (TYLENOL) tablet 650 mg   650 mg  Oral  Q6H PRN     ?   albuterol-ipratropium (DUO-NEB) 2.5 MG-0.5 MG/3 ML   3 mL  Nebulization  Q6H PRN     ?  naloxone (NARCAN) injection 0.1 mg   0.1 mg  IntraVENous  PRN     ?  enoxaparin (LOVENOX) injection 30 mg   30 mg  SubCUTAneous  Q24H     ?  fluconazole (DIFLUCAN) 200mg /100 mL IVPB (premix)   200 mg  IntraVENous  Q24H     ?  sodium bicarbonate tablet 1,300 mg   1,300 mg  Oral  TID           ?  trimethoprim-sulfamethoxazole (BACTRIM DS, SEPTRA DS) 160-800 mg per tablet 1 Tab   1 Tab  Oral  Q MON, WED & FRI              Physical Exam:   Vitals   Temp (24hrs), Avg:97.7 ??F (36.5 ??C), Min:97.2 ??F (36.2 ??C), Max:98.1 ??F (36.7 ??C)      Visit Vitals      BP  (!) 141/92 (BP 1 Location: Right arm, BP Patient Position: Supine)     Pulse  88     Temp  97.5 ??F (36.4 ??C)     Resp  16     Ht  5\' 9"  (1.753 m)     Wt  66.4 kg (146 lb 6.2 oz)     SpO2  100%        BMI  21.62 kg/m??           General: Well-developed, 28 y.o. year-old, male , in no acute distress   HEENT: Normocephalic, anicteric sclerae, Pupils equal, round reactive to light, no oropharyngeal lesions. Thrush improved   NChest: Symmetrical expansion   Abdomen: Soft, TTP, diffusely. No rebound.non distended, no organomegaly, BS+.   Musculoskeletal: Normal strength/tone. No edema. No clubbing or cyanosis   CNS: AAOx3. Cranial nerves II-XII intact. Grossly normal.No NR   SKIN: No skin lesion or rash. Dry, warm, intact                 Labs:  Results:        Chemistry  Recent Labs         08/24/18   0626  08/23/18   0831  08/22/18   0550      GLU  87  88  88      NA  142  142  142      K  4.0  3.8  3.8      CL  116*  117*  119*      CO2  19*  16*  18*      BUN  8  9  11       CREA  1.0  0.8  0.9      CA  7.5*  7.4*  7.3*      AGAP  7  9  6               CBC w/Diff  Recent Labs         08/24/18   0626  08/23/18   0831  08/22/18   0550      WBC  4.7  3.4*  3.2*      RBC  4.33  4.41  4.46      HGB  12.1*  12.3*  12.3*      HCT  35.2*  35.4*  35.8*      PLT  197  178  185               Microbiology  No results for input(s): CULT in the last 72 hours.           Imaging-        Results from Hospital Encounter encounter on 08/18/18     XR CHEST SNGL V           Narrative  INDICATION:   SOB         EXAMINATION:   XR CHEST SNGL V      COMPARISON:   02/14/2016      FINDINGS:  The study shows a normal sized heart.. The lungs are clear and well expanded.                    Impression  IMPRESSION:   Normal chest                Results from Hospital Encounter encounter on 08/06/18     CT ABD PELV WO CONT           Narrative  CT Abdomen and Pelvis without      Indication: Abdominal pain. Nausea, vomiting, diarrhea.      Comparison: 07/22/2018.      TECHNIQUE:    CT of the abdomen and pelvis WITHOUT intravenous contrast. Coronal and sagittal   reformations were obtained. Oral contrast was given.      All CT exams at this facility use one or more dose reduction techniques   including automatic exposure control, mA/kV adjustment per patient's size, or   iterative reconstruction technique. DICOM format imaged data is available to   non-affiliated external healthcare facilities or entities on a secure, media   free, reciprocal searchable basis with patient authorization for 12 months   following the date of the study.      DISCUSSION:      ABSENCE OF INTRAVENOUS CONTRAST DECREASES SENSITIVITY FOR DETECTION OF FOCAL   LESIONS AND VASCULAR PATHOLOGY.      LOWER THORAX: Normal.      HEPATOBILIARY: No focal hepatic lesions.  No biliary ductal dilatation.   SPLEEN: No splenomegaly.   PANCREAS: No focal masses or ductal dilatation.      ADRENALS: No adrenal nodules.   KIDNEYS/URETERS: No hydronephrosis, stones, or solid mass lesions.   PELVIC ORGANS/BLADDER: Unremarkable.      PERITONEUM / RETROPERITONEUM: No free air or fluid.   LYMPH NODES: No lymphadenopathy.   VESSELS: Unremarkable.      GI TRACT: Multiple nondistended fluid-filled loops of small bowel with   questionable mild wall thickening. Fluid-filled colon  with no significant wall   thickening. No obstruction. Normal appendix.      BONES AND SOFT TISSUES: No acute abnormality.              Impression  IMPRESSION:      Numerous fluid-filled loops of small and large bowel with borderline diffuse   small bowel wall thickening, concerning for infectious or inflammatory   enterocolitis. Findings have slightly progressed since 07/22/2018.              ---------------------------------------------------------------------------------------------------------------   I have independently examined the patient and reviewed all lab studies and imgaing as well as review of nursing notes and physican notes       33 min spent with > 50% of time spent counseling and coordination of care      Alfonso EllisMartha Joby Hershkowitz, MD   08/24/2018      [facility]   [phone]

## 2018-08-25 LAB — COMPREHENSIVE METABOLIC PANEL
ALT: 107 U/L — ABNORMAL HIGH (ref 12–78)
AST: 76 U/L — ABNORMAL HIGH (ref 15–37)
Albumin: 2.2 gm/dl — ABNORMAL LOW (ref 3.4–5.0)
Alkaline Phosphatase: 71 U/L (ref 45–117)
Anion Gap: 6 mmol/L (ref 5–15)
BUN: 10 mg/dl (ref 7–25)
CO2: 22 mEq/L (ref 21–32)
Calcium: 7.4 mg/dl — ABNORMAL LOW (ref 8.5–10.1)
Chloride: 115 mEq/L — ABNORMAL HIGH (ref 98–107)
Creatinine: 0.9 mg/dl (ref 0.6–1.3)
EGFR IF NonAfrican American: >60
GFR African American: >60
Glucose: 80 mg/dl (ref 74–106)
Potassium: 4 mEq/L (ref 3.5–5.1)
Sodium: 142 mEq/L (ref 136–145)
Total Bilirubin: 0.2 mg/dl (ref 0.2–1.0)
Total Protein: 5.9 gm/dl — ABNORMAL LOW (ref 6.4–8.2)

## 2018-08-25 LAB — LIPASE
Lipase: 37 U/L — ABNORMAL LOW (ref 73–393)
Lipase: 37 U/L — ABNORMAL LOW (ref 73–393)

## 2018-08-25 LAB — CBC
Hematocrit: 34.7 % — ABNORMAL LOW (ref 37.0–50.0)
Hemoglobin: 11.8 gm/dl — ABNORMAL LOW (ref 12.4–17.2)
MCH: 28 pg (ref 23.0–34.6)
MCHC: 34 gm/dl (ref 30.0–36.0)
MCV: 82.2 fL (ref 80.0–98.0)
MPV: 12 fL — ABNORMAL HIGH (ref 6.0–10.0)
Platelets: 159 10*3/uL (ref 140–450)
RBC: 4.22 M/uL (ref 3.80–5.70)
RDW-SD: 47.2 — ABNORMAL HIGH (ref 35.1–43.9)
WBC: 2.8 10*3/uL — ABNORMAL LOW (ref 4.0–11.0)

## 2018-08-25 LAB — METABOLIC PANEL, COMPREHENSIVE
ALT (SGPT): 107 U/L — ABNORMAL HIGH (ref 12–78)
AST (SGOT): 76 U/L — ABNORMAL HIGH (ref 15–37)
Albumin: 2.2 gm/dl — ABNORMAL LOW (ref 3.4–5.0)
Alk. phosphatase: 71 U/L (ref 45–117)
Anion gap: 6 mmol/L (ref 5–15)
BUN: 10 mg/dl (ref 7–25)
Bilirubin, total: 0.2 mg/dl (ref 0.2–1.0)
CO2: 22 mEq/L (ref 21–32)
Calcium: 7.4 mg/dl — ABNORMAL LOW (ref 8.5–10.1)
Chloride: 115 mEq/L — ABNORMAL HIGH (ref 98–107)
Creatinine: 0.9 mg/dl (ref 0.6–1.3)
GFR est AA: >60
GFR est non-AA: >60
Glucose: 80 mg/dl (ref 74–106)
Potassium: 4 mEq/L (ref 3.5–5.1)
Protein, total: 5.9 gm/dl — ABNORMAL LOW (ref 6.4–8.2)
Sodium: 142 mEq/L (ref 136–145)

## 2018-08-25 LAB — CBC W/O DIFF
HCT: 34.7 % — ABNORMAL LOW (ref 37.0–50.0)
HGB: 11.8 gm/dl — ABNORMAL LOW (ref 12.4–17.2)
MCH: 28 pg (ref 23.0–34.6)
MCHC: 34 gm/dl (ref 30.0–36.0)
MCV: 82.2 fL (ref 80.0–98.0)
MPV: 12 fL — ABNORMAL HIGH (ref 6.0–10.0)
PLATELET: 159 10*3/uL (ref 140–450)
RBC: 4.22 M/uL (ref 3.80–5.70)
RDW-SD: 47.2 — ABNORMAL HIGH (ref 35.1–43.9)
WBC: 2.8 10*3/uL — ABNORMAL LOW (ref 4.0–11.0)

## 2018-08-25 MED ORDER — LOPERAMIDE 2 MG CAP
2 mg | Freq: Two times a day (BID) | ORAL | Status: DC
Start: 2018-08-25 — End: 2018-08-26
  Administered 2018-08-26 (×2): via ORAL

## 2018-08-25 MED ORDER — SODIUM BICARBONATE 650 MG TAB
650 mg | Freq: Three times a day (TID) | ORAL | Status: DC
Start: 2018-08-25 — End: 2018-08-26
  Administered 2018-08-25 – 2018-08-26 (×3): via ORAL

## 2018-08-25 MED FILL — LOPERAMIDE 2 MG CAP: 2 mg | ORAL | Qty: 1

## 2018-08-25 MED FILL — OXYCODONE-ACETAMINOPHEN 5 MG-325 MG TAB: 5-325 mg | ORAL | Qty: 1

## 2018-08-25 MED FILL — ONDANSETRON (PF) 4 MG/2 ML INJECTION: 4 mg/2 mL | INTRAMUSCULAR | Qty: 2

## 2018-08-25 MED FILL — FLUCONAZOLE IN SALINE (ISO-OSMOTIC) 200 MG/100 ML IV PIGGY BACK: 200 mg/100 mL | INTRAVENOUS | Qty: 100

## 2018-08-25 MED FILL — PROMETHAZINE IN NS 12.5 MG/50 ML IV PIGGY BAG: 12.5 mg/50 ml | INTRAVENOUS | Qty: 50

## 2018-08-25 MED FILL — SODIUM BICARBONATE 650 MG TAB: 650 mg | ORAL | Qty: 2

## 2018-08-25 MED FILL — BIKTARVY 50 MG-200 MG-25 MG TABLET: 50-200-25 mg | ORAL | Qty: 1

## 2018-08-25 MED FILL — SODIUM BICARBONATE 650 MG TAB: 650 mg | ORAL | Qty: 1

## 2018-08-25 NOTE — Progress Notes (Signed)
Patient up walking around room more

## 2018-08-25 NOTE — Progress Notes (Signed)
INFECTIOUS DISEASE PROGRESS NOTE       Requested by: Dr Sandie AnoSimoes    Reason for consult: N/V/D in immunocopromised host    Date of admission: 08/18/2018    ABX:      Current abx Prior abx    Fluconazole 6/24     6  Bactrim prophylaxis 6/26  biktarvy started 6/26   nitazoxinide 6/13    11d  Flagyl 250 mg po bid                   2   x 7 d  Albendazole 400 mg bid  6/24   3       ASSESSMENT:        N/V/D/ dehydration    Uncontrolled parasitic infection of GI ( see below) likely-     Giardia should have responded but 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 in HIV- reconstitution of immune system with ART will help immensely     start ART as genomic testing reviewed-see below      initiate albendazol/flagyl  For rx of recalcitrant giardia-see below    Oral candidiasis and suspect esophagitis- improving      cryptosporidia/ Giardia concomitant infection   source unknown     D 11 of nitazoxinide PTA (see above)     6/24 now neg for giardia and persistent + cryptosporidia    Severe/constant nausea        HIV- new diagnosis    VL > 1 mil    cd4  17 abs/ 2%    hiv genomic testing for resistance sent 6/15- no resistance [ results available 6/26 ]  Started Biktarvy 6/26       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    ho neutropenia with adequate anc    nkda            RECOMMENDATIONS:    Severe nausea- hence flagyl d/ced and repeat giardia ag neg     -Expect cryptosporidia to resolve with immune reconstitution - started on Biktarvy 6/26 as genotype neg for any resistance  - cont imodium scheduled atleast twice a day - entered in system   -D/w Dr Sandie AnoSimoes, if his diarrhea is better on imodium, can use 3-4 times a day prn to control it as likely will take weeks before his CD4 count will come up. Can go home on imodium prn once diarrhea better controlled.  - cont high fiber , low fat , frequent small meals. Avoid caffeine and lactose [milk product]  - Rehydration per IM   - Agree with fluconazole- cont till CD4 above atleast 100, ideally recommended till CD4 around 200  - Hold azithro for now- if no nausea, restart next week  - cont pcp prophylaxis w/ bactrim  - Cont Biktarvy  - f/up EVMS ID post discharge - d/w pt if he is unable to get discharged today then should call and postpone appointment for next week.       MICROBIOLOGY:       PTA  5/27  Neg c diff  6/2    Stool culture neg  6/12  + for BOTH giardia and cryptosporidia-- lab repeated test with same results    Currrent  6/24  Stool neg CDI           Stool cullture IP           Crypto ag positive           Giardia ag neg  cc   Pt is feeling slightly better today. Says his nausea is well controlled with antiemetics. Also his diarrhea is better, so far gone twice since morning, also yesterday says his diarrhea was better, small amounts. Weakness present which will take some times.     Current Medications:     Current Facility-Administered Medications   Medication Dose Route Frequency   ??? dextrose 5% - 0.45% NaCl with KCl 20 mEq/L infusion  75 mL/hr IntraVENous CONTINUOUS   ??? loperamide (IMODIUM) capsule 2 mg  2 mg Oral Q4H PRN   ??? bictegrav-emtricit-tenofov ala (BIKTARVY) 50-200-25 mg tablet 1 Tab  1 Tab Oral DAILY   ??? ondansetron (ZOFRAN) injection 4 mg  4 mg IntraVENous Q6H   ??? promethazine (PHENERGAN) 12.5 mg in NS 50 mL IVPB  12.5 mg IntraVENous Q6H PRN   ??? oxyCODONE-acetaminophen (PERCOCET) 5-325 mg per tablet 1 Tab  1 Tab Oral Q6H PRN   ??? acetaminophen (TYLENOL) tablet 650 mg  650 mg Oral Q6H PRN   ??? albuterol-ipratropium (DUO-NEB) 2.5 MG-0.5 MG/3 ML  3 mL Nebulization Q6H PRN   ??? naloxone (NARCAN) injection 0.1 mg  0.1 mg IntraVENous PRN   ??? enoxaparin (LOVENOX) injection 30 mg  30 mg SubCUTAneous Q24H   ??? fluconazole (DIFLUCAN) 200mg /100 mL IVPB (premix)  200 mg IntraVENous Q24H   ??? sodium bicarbonate tablet 1,300 mg  1,300 mg Oral TID   ??? trimethoprim-sulfamethoxazole (BACTRIM DS, SEPTRA DS) 160-800 mg per  tablet 1 Tab  1 Tab Oral Q MON, WED & FRI         Physical Exam:  Vitals  Temp (24hrs), Avg:98.3 ??F (36.8 ??C), Min:98 ??F (36.7 ??C), Max:98.8 ??F (37.1 ??C)    Visit Vitals  BP (!) 142/99 (BP 1 Location: Right arm, BP Patient Position: Supine)   Pulse 70   Temp 98.1 ??F (36.7 ??C)   Resp 18   Ht 5\' 9"  (1.753 m)   Wt 66.4 kg (146 lb 6.2 oz)   SpO2 99%   BMI 21.62 kg/m??       General: Well-developed, 28 y.o. year-old, male, in no acute distress  HEENT: Normocephalic, anicteric sclerae, Pupils equal, round reactive to light, no oropharyngeal lesions. Thrush improved  NChest: Symmetrical expansion  Abdomen: Soft, TTP, diffusely. No rebound.non distended, no organomegaly, BS+.  Musculoskeletal: Normal strength/tone. No edema. No clubbing or cyanosis  CNS: AAOx3. Cranial nerves II-XII intact. Grossly normal.No NR  SKIN: No skin lesion or rash. Dry, warm, intact          Labs: Results:   Chemistry Recent Labs     08/25/18  0927 08/24/18  0626 08/23/18  0831   GLU 80 87 88   NA 142 142 142   K 4.0 4.0 3.8   CL 115* 116* 117*   CO2 22 19* 16*   BUN 10 8 9    CREA 0.9 1.0 0.8   CA 7.4* 7.5* 7.4*   AGAP 6 7 9    AP 71  --   --    TP 5.9*  --   --    ALB 2.2*  --   --       CBC w/Diff Recent Labs     08/25/18  0927 08/24/18  0626 08/23/18  0831   WBC 2.8* 4.7 3.4*   RBC 4.22 4.33 4.41   HGB 11.8* 12.1* 12.3*   HCT 34.7* 35.2* 35.4*   PLT 159 197 178      Microbiology No results  for input(s): CULT in the last 72 hours.       Imaging-    Results from Hospital Encounter encounter on 08/18/18   XR CHEST SNGL V    Narrative INDICATION:  SOB       EXAMINATION:  XR CHEST SNGL V    COMPARISON:  02/14/2016    FINDINGS:  The study shows a normal sized heart.. The lungs are clear and well expanded.           Impression IMPRESSION:  Normal chest         Results from Hospital Encounter encounter on 08/06/18   CT ABD PELV WO CONT    Narrative CT Abdomen and Pelvis without    Indication: Abdominal pain. Nausea, vomiting, diarrhea.     Comparison: 07/22/2018.    TECHNIQUE:   CT of the abdomen and pelvis WITHOUT intravenous contrast. Coronal and sagittal  reformations were obtained. Oral contrast was given.    All CT exams at this facility use one or more dose reduction techniques  including automatic exposure control, mA/kV adjustment per patient's size, or  iterative reconstruction technique. DICOM format imaged data is available to  non-affiliated external healthcare facilities or entities on a secure, media  free, reciprocal searchable basis with patient authorization for 12 months  following the date of the study.    DISCUSSION:    ABSENCE OF INTRAVENOUS CONTRAST DECREASES SENSITIVITY FOR DETECTION OF FOCAL  LESIONS AND VASCULAR PATHOLOGY.    LOWER THORAX: Normal.    HEPATOBILIARY: No focal hepatic lesions.  No biliary ductal dilatation.  SPLEEN: No splenomegaly.  PANCREAS: No focal masses or ductal dilatation.    ADRENALS: No adrenal nodules.  KIDNEYS/URETERS: No hydronephrosis, stones, or solid mass lesions.  PELVIC ORGANS/BLADDER: Unremarkable.    PERITONEUM / RETROPERITONEUM: No free air or fluid.  LYMPH NODES: No lymphadenopathy.  VESSELS: Unremarkable.    GI TRACT: Multiple nondistended fluid-filled loops of small bowel with  questionable mild wall thickening. Fluid-filled colon with no significant wall  thickening. No obstruction. Normal appendix.    BONES AND SOFT TISSUES: No acute abnormality.      Impression IMPRESSION:    Numerous fluid-filled loops of small and large bowel with borderline diffuse  small bowel wall thickening, concerning for infectious or inflammatory  enterocolitis. Findings have slightly progressed since 07/22/2018.         ---------------------------------------------------------------------------------------------------------------  I have independently examined the patient and reviewed all lab studies and imgaing as well as review of nursing notes and physican notes      32 min spent with > 50% of time spent counseling and coordination of care    Truman Hayward, MD  08/25/2018    [facility]  [phone]

## 2018-08-25 NOTE — Progress Notes (Signed)
INTERNAL MEDICINE PROGRESS NOTE    Date of note:      August 25, 2018    Patient:               Travis [name], 28 y.o., male  Admit Date:        08/18/2018  Length of Stay:  6 day(s)    Problem List:   Patient Active Problem List   Diagnosis Code   ??? AKI (acute kidney injury) ([place]) N17.9   ??? Dehydration E86.0   ??? Enterocolitis K52.9   ??? Intractable nausea and vomiting R11.2   ??? Diarrhea R19.7   ??? Metabolic acidosis S06.3   ??? HIV (human immunodeficiency virus infection) ([place]) B20       Subjective + interval history     Nausea is slightly better. Bowel movements though watery, seem to be decreasing in volume. Patient looks better overall    Assessment:   ??  1. Nausea, vomiting, dehydration  2. 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 Acute kidney injury present on admission  4. Metabolic acidosis???urine anion gap negative. ??Likely metabolic acidosis from GI losses.  5. Oral candidiasis  6. 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  ??  Plan:   ??  Continue [name] hydration. Patient's oral intake is still not good. I encouraged him to eat small frequent meals with nutritional supplements. Will try to stop fluids tomorrow if oral intake is better today  ??  Metabolic acidosis has resolved. Patient is on sodium bicarb tablets  ??????  Flagyl stopped due to severe nausea, albendazole stopped on 6/27 due to ineffectiveness;??azithromycin on hold until nausea improves  ??  Patient was started on Biktarvy on 6/26. Hopefully once his CD4 count improves, he will be able to get rid of diarrhea  ??  Continue PRN Lomotil.   ????  Continue??[name] fluconazole for oropharyngeal candidiasis  ??  Bactrim 3 times a week for PCP prophylaxis  ??  Lovenox and ambulation for DVT prophylaxis.  Patient refuses Lovenox on and off but he has been encouraged to ambulate frequently and exercises legs while he is in bed  ??  Continue nutritional supplements  ??   Continue current antiemetic regimen.??    Discussed at length with his mom on phone [phone]  ??  ??  ??  - Code status:??Full code  ??  Recommend to continue hospitalization.  Expected date of discharge:??2 days  Plan for disposition -??tbd    Objective:     Visit Vitals  BP (!) 142/99 (BP 1 Location: Right arm, BP Patient Position: Supine)   Pulse 70   Temp 98.1 ??F (36.7 ??C)   Resp 18   Ht 5' 9"  (1.753 m)   Wt 66.4 kg (146 lb 6.2 oz)   SpO2 99%   BMI 21.62 kg/m??             Intake/Output Summary (Last 24 hours) at 08/25/2018 1336  Last data filed at 08/24/2018 2118  Gross per 24 hour   Intake 326.25 ml   Output ???   Net 326.25 ml       Physical Exam:     GEN -alert and oriented x3, not in distress  HEENT - mucous membranes??moist; oral candidiasis is improving  Neck - supple, no JVD  Cardiac - RRR, S1, S2, no murmurs  Chest/Lungs - clear to auscultation without wheezes or rhonchi  Abdomen -  soft,??non tender  Extremities - no clubbing/ cyanosis/ edema  Neuro - CN 2-12 intact. No focal deficits. No motor or sensory deficit appreciated.   Skin - no rashes or lesions  ??    Current medications:       Current Facility-Administered Medications:   ???  dextrose 5% - 0.45% NaCl with KCl 20 mEq/L infusion, 75 mL/hr, IntraVENous, CONTINUOUS, Leviathan Macera S, MD, Last Rate: 75 mL/hr at 08/25/18 0550, 75 mL/hr at 08/25/18 0550  ???  [COMPLETED] loperamide (IMODIUM) capsule 4 mg, 4 mg, Oral, ONCE, 4 mg at 08/23/18 1425 **FOLLOWED BY** loperamide (IMODIUM) capsule 2 mg, 2 mg, Oral, Q4H PRN, Truman Hayward, MD, 2 mg at 08/25/18 6269  ???  bictegrav-emtricit-tenofov ala (BIKTARVY) 50-200-25 mg tablet 1 Tab, 1 Tab, Oral, DAILY, Jearld Adjutant, MD, 1 Tab at 08/25/18 4854  ???  ondansetron (ZOFRAN) injection 4 mg, 4 mg, IntraVENous, Q6H, Seyon Strader S, MD, 4 mg at 08/25/18 1129  ???  promethazine (PHENERGAN) 12.5 mg in NS 50 mL IVPB, 12.5 mg, IntraVENous, Q6H PRN, Edyth Glomb S, MD, Last Rate: 200 mL/hr at  08/23/18 2146, 12.5 mg at 08/23/18 2146  ???  oxyCODONE-acetaminophen (PERCOCET) 5-325 mg per tablet 1 Tab, 1 Tab, Oral, Q6H PRN, Bryten Maher S, MD, 1 Tab at 08/25/18 0923  ???  acetaminophen (TYLENOL) tablet 650 mg, 650 mg, Oral, Q6H PRN, Lalia Loudon S, MD  ???  albuterol-ipratropium (DUO-NEB) 2.5 MG-0.5 MG/3 ML, 3 mL, Nebulization, Q6H PRN, Tynesia Harral, Legrand Como, MD  ???  naloxone (NARCAN) injection 0.1 mg, 0.1 mg, IntraVENous, PRN, Jaelyn Bourgoin, Legrand Como, MD  ???  enoxaparin (LOVENOX) injection 30 mg, 30 mg, SubCUTAneous, Q24H, Ulyses Panico S, MD, 30 mg at 08/20/18 1138  ???  fluconazole (DIFLUCAN) 266m/100 mL IVPB (premix), 200 mg, IntraVENous, Q24H, Suhaas Agena S, MD, Last Rate: 100 mL/hr at 08/25/18 1023, 200 mg at 08/25/18 1023  ???  sodium bicarbonate tablet 1,300 mg, 1,300 mg, Oral, TID, Laporcha Marchesi S, MD, 1,300 mg at 08/25/18 06270 ???  trimethoprim-sulfamethoxazole (BACTRIM DS, SEPTRA DS) 160-800 mg per tablet 1 Tab, 1 Tab, Oral, Q MON, WED & FChristin Bach MD, 1 Tab at 08/24/18 1110    Labs:     Recent Results (from the past 24 hour(s))   CBC W/O DIFF    Collection Time: 08/25/18  9:27 AM   Result Value Ref Range    WBC 2.8 (L) 4.0 - 11.0 1000/mm3    RBC 4.22 3.80 - 5.70 M/uL    HGB 11.8 (L) 12.4 - 17.2 gm/dl    HCT 34.7 (L) 37.0 - 50.0 %    MCV 82.2 80.0 - 98.0 fL    MCH 28.0 23.0 - 34.6 pg    MCHC 34.0 30.0 - 36.0 gm/dl    PLATELET 159 140 - 450 1000/mm3    MPV 12.0 (H) 6.0 - 10.0 fL    RDW-SD 47.2 (H) 35.1 - 43.9     LIPASE    Collection Time: 08/25/18  9:27 AM   Result Value Ref Range    Lipase 37 (L) 73 - 3350U/L   METABOLIC PANEL, COMPREHENSIVE    Collection Time: 08/25/18  9:27 AM   Result Value Ref Range    Sodium 142 136 - 145 mEq/L    Potassium 4.0 3.5 - 5.1 mEq/L    Chloride 115 (H) 98 - 107 mEq/L    CO2 22 21 - 32 mEq/L    Glucose 80 74 - 106 mg/dl  BUN 10 7 - 25 mg/dl    Creatinine 0.9 0.6 - 1.3 mg/dl    GFR est AA >60      GFR est non-AA >60      Calcium 7.4 (L) 8.5 - 10.1 mg/dl     AST (SGOT) 76 (H) 15 - 37 U/L    ALT (SGPT) 107 (H) 12 - 78 U/L    Alk. phosphatase 71 45 - 117 U/L    Bilirubin, total 0.2 0.2 - 1.0 mg/dl    Protein, total 5.9 (L) 6.4 - 8.2 gm/dl    Albumin 2.2 (L) 3.4 - 5.0 gm/dl    Anion gap 6 5 - 15 mmol/L       XR Results:  Results from Hospital Encounter encounter on 08/18/18   XR CHEST SNGL V    Narrative INDICATION:  SOB       EXAMINATION:  XR CHEST SNGL V    COMPARISON:  02/14/2016    FINDINGS:  The study shows a normal sized heart.. The lungs are clear and well expanded.           Impression IMPRESSION:  Normal chest         CT Results:  Results from Hospital Encounter encounter on 08/06/18   CT ABD PELV WO CONT    Narrative CT Abdomen and Pelvis without    Indication: Abdominal pain. Nausea, vomiting, diarrhea.    Comparison: 07/22/2018.    TECHNIQUE:   CT of the abdomen and pelvis WITHOUT intravenous contrast. Coronal and sagittal  reformations were obtained. Oral contrast was given.    All CT exams at this facility use one or more dose reduction techniques  including automatic exposure control, mA/kV adjustment per patient's size, or  iterative reconstruction technique. DICOM format imaged data is available to  non-affiliated external healthcare facilities or entities on a secure, media  free, reciprocal searchable basis with patient authorization for 12 months  following the date of the study.    DISCUSSION:    ABSENCE OF INTRAVENOUS CONTRAST DECREASES SENSITIVITY FOR DETECTION OF FOCAL  LESIONS AND VASCULAR PATHOLOGY.    LOWER THORAX: Normal.    HEPATOBILIARY: No focal hepatic lesions.  No biliary ductal dilatation.  SPLEEN: No splenomegaly.  PANCREAS: No focal masses or ductal dilatation.    ADRENALS: No adrenal nodules.  KIDNEYS/URETERS: No hydronephrosis, stones, or solid mass lesions.  PELVIC ORGANS/BLADDER: Unremarkable.    PERITONEUM / RETROPERITONEUM: No free air or fluid.  LYMPH NODES: No lymphadenopathy.  VESSELS: Unremarkable.     GI TRACT: Multiple nondistended fluid-filled loops of small bowel with  questionable mild wall thickening. Fluid-filled colon with no significant wall  thickening. No obstruction. Normal appendix.    BONES AND SOFT TISSUES: No acute abnormality.      Impression IMPRESSION:    Numerous fluid-filled loops of small and large bowel with borderline diffuse  small bowel wall thickening, concerning for infectious or inflammatory  enterocolitis. Findings have slightly progressed since 07/22/2018.         MRI Results:  No results found for this or any previous visit.    Nuclear Medicine Results:  No results found for this or any previous visit.    [place] Results:  Results from [place] encounter on 07/21/18   [place] RUQ    Narrative Clinical history: Elevated liver function studies    EXAMINATION:  Upper quadrant ultrasound 07/28/2018    FINDINGS:  Liver demonstrates homogeneous echotexture. No intrahepatic bile duct  dilatation. Bile duct  measures 4 mm. No stones are seen in the gallbladder.  Gallbladder wall measures 2 mm. Sonographic Percell Miller sign is negative.    Pancreas, proximal aorta and IVC are within normal limits. Right kidney measures  10.4 x 5.6 x 3.7 cm. Normal corticomedullary differentiation. No hydronephrosis.  Small amounts of perihepatic ascites.      Impression IMPRESSION:  Small amounts of perihepatic ascites.         IR Results:  No results found for this or any previous visit.    VAS/US Results:  No results found for this or any previous visit.      Total clinical care time was 36  minutes of which more than 50% was spent in coordination of care and counseling (time spent with patient/family face to face, physical exam, reviewing laboratory and imaging investigations, speaking with physicians and nursing staff involved in this patient's care).     Perfecto Kingdom,  MD  [facility]  August 25, 2018  Time: 1:36 PM

## 2018-08-25 NOTE — Progress Notes (Signed)
Patient up walking around room more

## 2018-08-25 NOTE — Progress Notes (Signed)
Progress Notes by Everlene Farrier, MD at 08/25/18 1336                Author: Everlene Farrier, MD  Service: Hospitalist  Author Type: Physician       Filed: 08/25/18 1524  Date of Service: 08/25/18 1336  Status: Signed          Editor: Everlene Farrier, MD (Physician)                          INTERNAL MEDICINE PROGRESS NOTE      Date of note:      August 25, 2018      Patient:               Travis [name],  28 y.o., male   Admit Date:        08/18/2018   Length of Stay:  6 day(s)      Problem List:      Patient Active Problem List        Diagnosis  Code         ?  AKI (acute kidney injury) ([place])  N17.9     ?  Dehydration  E86.0     ?  Enterocolitis  K52.9     ?  Intractable nausea and vomiting  R11.2     ?  Diarrhea  R19.7     ?  Metabolic acidosis  G25.4         ?  HIV (human immunodeficiency virus infection) ([place])  B20             Subjective + interval history        Nausea is slightly better. Bowel movements though watery, seem to be decreasing in volume. Patient looks better overall        Assessment:     ??   1.  Nausea, vomiting, dehydration   2.  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  Acute kidney injury present on admission   4.  Metabolic acidosis-urine anion gap negative. ??Likely metabolic acidosis from GI losses.   5.  Oral candidiasis   6.  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  Severe protein calorie malnutrition   8.  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   ??     Plan:     ??   Continue [name] hydration. Patient's oral intake is still not good. I encouraged him to eat small frequent meals with nutritional supplements. Will try to stop fluids tomorrow if oral intake is better today   ??   Metabolic acidosis has resolved. Patient is on sodium bicarb tablets   ??????   Flagyl stopped due to severe nausea, albendazole stopped on 6/27 due to ineffectiveness;??azithromycin on hold until nausea improves   ??   Patient was started on Biktarvy on 6/26. Hopefully once his CD4 count  improves, he will be able to get rid of diarrhea   ??   Continue PRN Lomotil.    ????   Continue??[name] fluconazole for oropharyngeal candidiasis   ??   Bactrim 3 times a week for PCP prophylaxis   ??   Lovenox and ambulation for DVT prophylaxis.  Patient refuses Lovenox on and off but he has been encouraged to ambulate frequently and exercises legs while he is in bed   ??   Continue nutritional supplements   ??   Continue current antiemetic regimen.??  Discussed at length with his mom on phone [phone]   ??   ??   ??   - Code status:??Full code   ??   Recommend to continue hospitalization.   Expected date of discharge:??2 days   Plan for disposition -??tbd        Objective:        Visit Vitals      BP  (!) 142/99 (BP 1 Location: Right arm, BP Patient Position: Supine)     Pulse  70     Temp  98.1 ??F (36.7 ??C)     Resp  18     Ht  5' 9"  (1.753 m)     Wt  66.4 kg (146 lb 6.2 oz)     SpO2  99%        BMI  21.62 kg/m??                    Intake/Output Summary (Last 24 hours) at 08/25/2018 1336   Last data filed at 08/24/2018 2118     Gross per 24 hour        Intake  326.25 ml        Output  --        Net  326.25 ml             Physical Exam:        GEN -alert and oriented x3, not in distress   HEENT - mucous membranes??moist; oral candidiasis is improving   Neck - supple, no JVD   Cardiac - RRR, S1, S2, no murmurs   Chest/Lungs - clear to auscultation without wheezes or rhonchi   Abdomen - soft,??non tender   Extremities - no clubbing/ cyanosis/ edema   Neuro - CN 2-12 intact. No focal deficits. No motor or sensory deficit appreciated.    Skin - no rashes or lesions   ??        Current medications:           Current Facility-Administered Medications:    ?  dextrose 5% - 0.45% NaCl with KCl 20 mEq/L infusion, 75 mL/hr, IntraVENous, CONTINUOUS, Madonna Flegal S, MD, Last Rate: 75 mL/hr at 08/25/18 0550, 75 mL/hr at 08/25/18 0550   ?  [COMPLETED] loperamide (IMODIUM) capsule 4 mg, 4 mg, Oral, ONCE, 4 mg at 08/23/18 1425 **FOLLOWED BY**  loperamide (IMODIUM) capsule 2 mg, 2 mg, Oral, Q4H PRN, Truman Hayward, MD, 2 mg at 08/25/18 2993   ?  bictegrav-emtricit-tenofov ala (BIKTARVY) 50-200-25 mg tablet 1 Tab, 1 Tab, Oral, DAILY, Jearld Adjutant, MD, 1 Tab at 08/25/18 [phone]   ?  ondansetron (ZOFRAN) injection 4 mg, 4 mg, IntraVENous, Q6H, Lucila Klecka S, MD, 4 mg at 08/25/18 1129   ?  promethazine (PHENERGAN) 12.5 mg in NS 50 mL IVPB, 12.5 mg, IntraVENous, Q6H PRN, Malisa Ruggiero S, MD, Last Rate: 200 mL/hr at 08/23/18 2146, 12.5 mg at 08/23/18 2146   ?  oxyCODONE-acetaminophen (PERCOCET) 5-325 mg per tablet 1 Tab, 1 Tab, Oral, Q6H PRN, Willi Borowiak S, MD, 1 Tab at 08/25/18 6789   ?  acetaminophen (TYLENOL) tablet 650 mg, 650 mg, Oral, Q6H PRN, Abbagale Goguen S, MD   ?  albuterol-ipratropium (DUO-NEB) 2.5 MG-0.5 MG/3 ML, 3 mL, Nebulization, Q6H PRN, Chanelle Hodsdon S, MD   ?  naloxone (NARCAN) injection 0.1 mg, 0.1 mg, IntraVENous, PRN, Lorelie Biermann S, MD   ?  enoxaparin (LOVENOX) injection 30 mg, 30 mg, SubCUTAneous, Q24H, Josafat Enrico  S, MD, 30 mg at 08/20/18 1138   ?  fluconazole (DIFLUCAN) 235m/100 mL IVPB (premix), 200 mg, IntraVENous, Q24H, Jahnai Slingerland S, MD, Last Rate: 100 mL/hr at 08/25/18 1023, 200 mg at 08/25/18 1023   ?  sodium bicarbonate tablet 1,300 mg, 1,300 mg, Oral, TID, Teresita Fanton S, MD, 1,300 mg at 08/25/18 04270  ?  trimethoprim-sulfamethoxazole (BACTRIM DS, SEPTRA DS) 160-800 mg per tablet 1 Tab, 1 Tab, Oral, Q MON, WED & FChristin Bach MD, 1 Tab at 08/24/18 1110        Labs:          Recent Results (from the past 24 hour(s))     CBC W/O DIFF          Collection Time: 08/25/18  9:27 AM         Result  Value  Ref Range            WBC  2.8 (L)  4.0 - 11.0 1000/mm3       RBC  4.22  3.80 - 5.70 M/uL       HGB  11.8 (L)  12.4 - 17.2 gm/dl       HCT  34.7 (L)  37.0 - 50.0 %       MCV  82.2  80.0 - 98.0 fL       MCH  28.0  23.0 - 34.6 pg       MCHC  34.0  30.0 - 36.0 gm/dl       PLATELET  159  140 - 450 1000/mm3        MPV  12.0 (H)  6.0 - 10.0 fL       RDW-SD  47.2 (H)  35.1 - 43.9         LIPASE          Collection Time: 08/25/18  9:27 AM         Result  Value  Ref Range            Lipase  37 (L)  73 - 393 U/L       METABOLIC PANEL, COMPREHENSIVE          Collection Time: 08/25/18  9:27 AM         Result  Value  Ref Range            Sodium  142  136 - 145 mEq/L       Potassium  4.0  3.5 - 5.1 mEq/L       Chloride  115 (H)  98 - 107 mEq/L       CO2  22  21 - 32 mEq/L       Glucose  80  74 - 106 mg/dl       BUN  10  7 - 25 mg/dl       Creatinine  0.9  0.6 - 1.3 mg/dl       GFR est AA  >60          GFR est non-AA  >60          Calcium  7.4 (L)  8.5 - 10.1 mg/dl       AST (SGOT)  76 (H)  15 - 37 U/L       ALT (SGPT)  107 (H)  12 - 78 U/L       Alk. phosphatase  71  45 - 117 U/L       Bilirubin, total  0.2  0.2 - 1.0 mg/dl       Protein, total  5.9 (L)  6.4 - 8.2 gm/dl       Albumin  2.2 (L)  3.4 - 5.0 gm/dl            Anion gap  6  5 - 15 mmol/L           XR Results:     Results from Hospital Encounter encounter on 08/18/18     XR CHEST SNGL V           Narrative  INDICATION:   SOB         EXAMINATION:   XR CHEST SNGL V      COMPARISON:   02/14/2016      FINDINGS:   The study shows a normal sized heart.. The lungs are clear and well expanded.                    Impression  IMPRESSION:   Normal chest              CT Results:     Results from Hospital Encounter encounter on 08/06/18     CT ABD PELV WO CONT           Narrative  CT Abdomen and Pelvis without      Indication: Abdominal pain. Nausea, vomiting, diarrhea.      Comparison: 07/22/2018.      TECHNIQUE:    CT of the abdomen and pelvis WITHOUT intravenous contrast. Coronal and sagittal   reformations were obtained. Oral contrast was given.      All CT exams at this facility use one or more dose reduction techniques   including automatic exposure control, mA/kV adjustment per patient's size, or   iterative reconstruction technique. DICOM format imaged data is available to    non-affiliated external healthcare facilities or entities on a secure, media   free, reciprocal searchable basis with patient authorization for 12 months   following the date of the study.      DISCUSSION:      ABSENCE OF INTRAVENOUS CONTRAST DECREASES SENSITIVITY FOR DETECTION OF FOCAL   LESIONS AND VASCULAR PATHOLOGY.      LOWER THORAX: Normal.      HEPATOBILIARY: No focal hepatic lesions.  No biliary ductal dilatation.   SPLEEN: No splenomegaly.   PANCREAS: No focal masses or ductal dilatation.      ADRENALS: No adrenal nodules.   KIDNEYS/URETERS: No hydronephrosis, stones, or solid mass lesions.   PELVIC ORGANS/BLADDER: Unremarkable.      PERITONEUM / RETROPERITONEUM: No free air or fluid.   LYMPH NODES: No lymphadenopathy.   VESSELS: Unremarkable.      GI TRACT: Multiple nondistended fluid-filled loops of small bowel with   questionable mild wall thickening. Fluid-filled colon with no significant wall   thickening. No obstruction. Normal appendix.      BONES AND SOFT TISSUES: No acute abnormality.              Impression  IMPRESSION:      Numerous fluid-filled loops of small and large bowel with borderline diffuse   small bowel wall thickening, concerning for infectious or inflammatory   enterocolitis. Findings have slightly progressed since 07/22/2018.              MRI Results:   No results found for this or any previous visit.      Nuclear Medicine Results:   No results found for this or  any previous visit.      [place] Results:     Results from [place] encounter on 07/21/18     [place] RUQ           Narrative  Clinical history: Elevated liver function studies      EXAMINATION:   Upper quadrant ultrasound 07/28/2018      FINDINGS:   Liver demonstrates homogeneous echotexture. No intrahepatic bile duct   dilatation. Bile duct measures 4 mm. No stones are seen in the gallbladder.   Gallbladder wall measures 2 mm. Sonographic Percell Miller sign is negative.      Pancreas, proximal aorta and IVC are within normal limits.  Right kidney measures   10.4 x 5.6 x 3.7 cm. Normal corticomedullary differentiation. No hydronephrosis.   Small amounts of perihepatic ascites.              Impression  IMPRESSION:   Small amounts of perihepatic ascites.              IR Results:   No results found for this or any previous visit.      VAS/US Results:   No results found for this or any previous visit.         Total clinical care time was 36  minutes of which more than 50% was spent in coordination of care and counseling (time spent with patient/family face to face, physical exam, reviewing laboratory and imaging investigations, speaking with physicians and  nursing staff involved in this patient's care).       Perfecto Kingdom,  MD   [facility]   August 25, 2018   Time: 1:36 PM

## 2018-08-25 NOTE — Progress Notes (Signed)
Progress  Notes by Radford PaxSingh, Yunuen Mordan, MD at 08/25/18 1408                Author: Radford PaxSingh, Cerise Lieber, MD  Service: Infectious Disease  Author Type: Physician       Filed: 08/25/18 1419  Date of Service: 08/25/18 1408  Status: Signed          Editor: Radford PaxSingh, Dartagnan Beavers, MD (Physician)                                                                                       INFECTIOUS DISEASE PROGRESS NOTE          Requested by: Dr Sandie AnoSimoes      Reason for consult: N/V/D in immunocopromised host      Date of admission: 08/18/2018        ABX:            Current abx  Prior abx      Fluconazole 6/24     6   Bactrim prophylaxis 6/26   biktarvy started 6/26    nitazoxinide 6/13    11d   Flagyl 250 mg po bid                   2   x 7 d   Albendazole 400 mg bid  6/24   3            ASSESSMENT:             N/V/D/ dehydration     Uncontrolled parasitic infection of GI ( see below) likely-      Giardia should have responded but 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 in HIV- reconstitution of immune system with ART will help immensely      start ART as genomic testing reviewed-see below       initiate albendazol/flagyl  For rx of recalcitrant giardia-see below      Oral candidiasis and suspect esophagitis - improving         cryptosporidia/ Giardia concomitant infection    source unknown      D 11 of nitazoxinide PTA (see above)      6/24 now neg for giardia and persistent + cryptosporidia     Severe/constant nausea           HIV- new diagnosis     VL > 1 mil     cd4  17 abs/ 2%     hiv genomic testing for resistance sent 6/15- no resistance [ results available 6/26 ]   Started Biktarvy 6/26          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 Can go home on imodium prn once diarrhea better controlled.   - cont high fiber , low fat , frequent small meals. Avoid caffeine and lactose [milk product]   - Rehydration per IM   - Agree with fluconazole- cont till CD4 above atleast 100, ideally recommended till CD4 around 200   - Hold azithro for now- if no nausea, restart next week   - cont pcp prophylaxis w/ bactrim   - Cont Biktarvy   - f/up [facility] ID post discharge - d/w pt if he is unable to get discharged today then should call and postpone appointment for next week.            MICROBIOLOGY:          PTA   5/27  Neg c diff   6/2    Stool culture neg   6/12  + for BOTH giardia and cryptosporidia-- lab repeated test with same results      Currrent   6/24  Stool neg CDI            Stool cullture IP            Crypto ag positive            Giardia ag neg         cc     Pt is feeling slightly better today. Says his nausea is well controlled with antiemetics. Also his diarrhea is better, so far gone twice since morning, also  yesterday says his diarrhea was better, small amounts. Weakness present which will take some times.         Current Medications:          Current Facility-Administered Medications          Medication  Dose  Route  Frequency           ?  dextrose 5% - 0.45% NaCl with KCl 20 mEq/L infusion   75 mL/hr  IntraVENous  CONTINUOUS     ?  loperamide (IMODIUM) capsule 2 mg   2 mg  Oral  Q4H PRN     ?  bictegrav-emtricit-tenofov ala (BIKTARVY) 50-200-25 mg tablet 1 Tab   1 Tab  Oral  DAILY           ?  ondansetron (ZOFRAN) injection 4 mg   4 mg  IntraVENous  Q6H           ?  promethazine (PHENERGAN) 12.5 mg in NS 50 mL IVPB   12.5 mg  IntraVENous  Q6H PRN     ?  oxyCODONE-acetaminophen (PERCOCET) 5-325 mg per tablet 1 Tab   1 Tab  Oral  Q6H PRN     ?  acetaminophen  (TYLENOL) tablet 650 mg   650 mg  Oral  Q6H PRN     ?  albuterol-ipratropium (DUO-NEB) 2.5 MG-0.5 MG/3 ML   3 mL  Nebulization  Q6H PRN     ?  naloxone (NARCAN) injection 0.1 mg   0.1 mg  IntraVENous  PRN     ?  enoxaparin (LOVENOX) injection 30 mg   30 mg  SubCUTAneous  Q24H     ?  fluconazole (DIFLUCAN) 200mg /100 mL IVPB (premix)   200 mg  IntraVENous  Q24H     ?  sodium bicarbonate tablet 1,300 mg   1,300 mg  Oral  TID           ?  trimethoprim-sulfamethoxazole (BACTRIM DS, SEPTRA DS) 160-800 mg per tablet 1 Tab   1 Tab  Oral  Q MON, WED & FRI              Physical Exam:   Vitals   Temp (24hrs), Avg:98.3 ??F (36.8 ??C), Min:98 ??F (36.7 ??C), Max:98.8 ??F (37.1 ??C)      Visit Vitals      BP  (!) 142/99 (BP 1 Location: Right arm, BP Patient Position: Supine)     Pulse  70     Temp  98.1 ??F (36.7 ??C)     Resp  18     Ht  5\' 9"  (1.753 m)     Wt  66.4 kg (146 lb 6.2 oz)     SpO2  99%        BMI  21.62 kg/m??           General: Well-developed, 28 y.o. year-old, male , in no acute distress   HEENT: Normocephalic, anicteric sclerae, Pupils equal, round reactive to light, no oropharyngeal lesions. Thrush improved   NChest: Symmetrical expansion   Abdomen: Soft, TTP, diffusely. No rebound.non distended, no organomegaly, BS+.   Musculoskeletal: Normal strength/tone. No edema. No clubbing or cyanosis   CNS: AAOx3. Cranial nerves II-XII intact. Grossly normal.No NR   SKIN: No skin lesion or rash. Dry, warm, intact                 Labs:  Results:        Chemistry  Recent Labs         08/25/18   0927  08/24/18   0626  08/23/18   0831      GLU  80  87  88      NA  142  142  142      K  4.0  4.0  3.8      CL  115*  116*  117*      CO2  22  19*  16*      BUN  10  8  9       CREA  0.9  1.0  0.8      CA  7.4*  7.5*  7.4*      AGAP  6  7  9       AP  71   --    --       TP  5.9*   --    --       ALB  2.2*   --    --               CBC w/Diff  Recent Labs         08/25/18   0927  08/24/18   0626  08/23/18   0831      WBC  2.8*  4.7  3.4*       RBC  4.22  4.33  4.41      HGB  11.8*  12.1*  12.3*      HCT  34.7*  35.2*  35.4*      PLT  159  197  178              Microbiology  No results for input(s): CULT in the last 72 hours.  Imaging-        Results from Hospital Encounter encounter on 08/18/18     XR CHEST SNGL V           Narrative  INDICATION:   SOB         EXAMINATION:   XR CHEST SNGL V      COMPARISON:   02/14/2016      FINDINGS:   The study shows a normal sized heart.. The lungs are clear and well expanded.                    Impression  IMPRESSION:   Normal chest                Results from Hospital Encounter encounter on 08/06/18     CT ABD PELV WO CONT           Narrative  CT Abdomen and Pelvis without      Indication: Abdominal pain. Nausea, vomiting, diarrhea.      Comparison: 07/22/2018.      TECHNIQUE:    CT of the abdomen and pelvis WITHOUT intravenous contrast. Coronal and sagittal   reformations were obtained. Oral contrast was given.      All CT exams at this facility use one or more dose reduction techniques   including automatic exposure control, mA/kV adjustment per patient's size, or   iterative reconstruction technique. DICOM format imaged data is available to   non-affiliated external healthcare facilities or entities on a secure, media   free, reciprocal searchable basis with patient authorization for 12 months   following the date of the study.      DISCUSSION:      ABSENCE OF INTRAVENOUS CONTRAST DECREASES SENSITIVITY FOR DETECTION OF FOCAL   LESIONS AND VASCULAR PATHOLOGY.      LOWER THORAX: Normal.      HEPATOBILIARY: No focal hepatic lesions.  No biliary ductal dilatation.   SPLEEN: No splenomegaly.   PANCREAS: No focal masses or ductal dilatation.      ADRENALS: No adrenal nodules.   KIDNEYS/URETERS: No hydronephrosis, stones, or solid mass lesions.   PELVIC ORGANS/BLADDER: Unremarkable.      PERITONEUM / RETROPERITONEUM: No free air or fluid.   LYMPH NODES: No lymphadenopathy.   VESSELS: Unremarkable.      GI TRACT:  Multiple nondistended fluid-filled loops of small bowel with   questionable mild wall thickening. Fluid-filled colon with no significant wall   thickening. No obstruction. Normal appendix.      BONES AND SOFT TISSUES: No acute abnormality.              Impression  IMPRESSION:      Numerous fluid-filled loops of small and large bowel with borderline diffuse   small bowel wall thickening, concerning for infectious or inflammatory   enterocolitis. Findings have slightly progressed since 07/22/2018.              ---------------------------------------------------------------------------------------------------------------   I have independently examined the patient and reviewed all lab studies and imgaing as well as review of nursing notes and physican notes       32 min spent with > 50% of time spent counseling and coordination of care      Radford PaxSushma Saliou Barnier, MD   08/25/2018      [facility]   [phone]

## 2018-08-26 LAB — CBC
Hematocrit: 34.5 % — ABNORMAL LOW (ref 37.0–50.0)
Hemoglobin: 11.8 gm/dl — ABNORMAL LOW (ref 12.4–17.2)
MCH: 28 pg (ref 23.0–34.6)
MCHC: 34.2 gm/dl (ref 30.0–36.0)
MCV: 81.8 fL (ref 80.0–98.0)
MPV: 11.8 fL — ABNORMAL HIGH (ref 6.0–10.0)
Platelets: 166 10*3/uL (ref 140–450)
RBC: 4.22 M/uL (ref 3.80–5.70)
RDW-SD: 45.7 — ABNORMAL HIGH (ref 35.1–43.9)
WBC: 3.5 10*3/uL — ABNORMAL LOW (ref 4.0–11.0)

## 2018-08-26 LAB — BASIC METABOLIC PANEL
Anion Gap: 7 mmol/L (ref 5–15)
BUN: 12 mg/dl (ref 7–25)
CO2: 21 mEq/L (ref 21–32)
Calcium: 7.5 mg/dl — ABNORMAL LOW (ref 8.5–10.1)
Chloride: 114 mEq/L — ABNORMAL HIGH (ref 98–107)
Creatinine: 0.9 mg/dl (ref 0.6–1.3)
EGFR IF NonAfrican American: >60
GFR African American: >60
Glucose: 82 mg/dl (ref 74–106)
Potassium: 4.1 mEq/L (ref 3.5–5.1)
Sodium: 142 mEq/L (ref 136–145)

## 2018-08-26 LAB — CBC W/O DIFF
HCT: 34.5 % — ABNORMAL LOW (ref 37.0–50.0)
HGB: 11.8 gm/dl — ABNORMAL LOW (ref 12.4–17.2)
MCH: 28 pg (ref 23.0–34.6)
MCHC: 34.2 gm/dl (ref 30.0–36.0)
MCV: 81.8 fL (ref 80.0–98.0)
MPV: 11.8 fL — ABNORMAL HIGH (ref 6.0–10.0)
PLATELET: 166 10*3/uL (ref 140–450)
RBC: 4.22 M/uL (ref 3.80–5.70)
RDW-SD: 45.7 — ABNORMAL HIGH (ref 35.1–43.9)
WBC: 3.5 10*3/uL — ABNORMAL LOW (ref 4.0–11.0)

## 2018-08-26 LAB — METABOLIC PANEL, BASIC
Anion gap: 7 mmol/L (ref 5–15)
BUN: 12 mg/dl (ref 7–25)
CO2: 21 mEq/L (ref 21–32)
Calcium: 7.5 mg/dl — ABNORMAL LOW (ref 8.5–10.1)
Chloride: 114 mEq/L — ABNORMAL HIGH (ref 98–107)
Creatinine: 0.9 mg/dl (ref 0.6–1.3)
GFR est AA: >60
GFR est non-AA: >60
Glucose: 82 mg/dl (ref 74–106)
Potassium: 4.1 mEq/L (ref 3.5–5.1)
Sodium: 142 mEq/L (ref 136–145)

## 2018-08-26 MED ORDER — PROMETHAZINE 12.5 MG TAB
12.5 mg | ORAL_TABLET | Freq: Four times a day (QID) | ORAL | 0 refills | Status: AC | PRN
Start: 2018-08-26 — End: ?

## 2018-08-26 MED ORDER — LOPERAMIDE 2 MG CAP
2 mg | ORAL_CAPSULE | ORAL | 0 refills | Status: AC | PRN
Start: 2018-08-26 — End: 2018-09-05

## 2018-08-26 MED ORDER — BICTEGRAVIR 50 MG-EMTRICITABINE 200 MG-TENOFOVIR ALAFENAM 25 MG TABLET
50-200-25 mg | ORAL_TABLET | Freq: Every day | ORAL | 1 refills | Status: AC
Start: 2018-08-26 — End: ?

## 2018-08-26 MED ORDER — FLUCONAZOLE 200 MG TAB
200 mg | ORAL_TABLET | Freq: Every day | ORAL | 0 refills | Status: AC
Start: 2018-08-26 — End: 2018-09-25

## 2018-08-26 MED ORDER — TRIMETHOPRIM-SULFAMETHOXAZOLE 160 MG-800 MG TAB
160-800 mg | ORAL_TABLET | ORAL | 1 refills | Status: AC
Start: 2018-08-26 — End: ?

## 2018-08-26 MED FILL — SODIUM BICARBONATE 650 MG TAB: 650 mg | ORAL | Qty: 1

## 2018-08-26 MED FILL — OXYCODONE-ACETAMINOPHEN 5 MG-325 MG TAB: 5-325 mg | ORAL | Qty: 1

## 2018-08-26 MED FILL — LOPERAMIDE 2 MG CAP: 2 mg | ORAL | Qty: 1

## 2018-08-26 MED FILL — ONDANSETRON (PF) 4 MG/2 ML INJECTION: 4 mg/2 mL | INTRAMUSCULAR | Qty: 2

## 2018-08-26 MED FILL — ACETAMINOPHEN 325 MG TABLET: 325 mg | ORAL | Qty: 2

## 2018-08-26 MED FILL — TRIMETHOPRIM-SULFAMETHOXAZOLE 160 MG-800 MG TAB: 160-800 mg | ORAL | Qty: 1

## 2018-08-26 MED FILL — BIKTARVY 50 MG-200 MG-25 MG TABLET: 50-200-25 mg | ORAL | Qty: 1

## 2018-08-26 MED FILL — FLUCONAZOLE IN SALINE (ISO-OSMOTIC) 200 MG/100 ML IV PIGGY BACK: 200 mg/100 mL | INTRAVENOUS | Qty: 100

## 2018-08-26 NOTE — Other (Addendum)
----------  DocumentID: [id number]------------------------------------------------              [facility]                       Patient Education Report         Name: Travis [name], Travis [name]                  Date: 08/19/2018    MRN: [id number]                    Time: 7:33:07 AM         Patient ordered video: 'Patient Safety: Stay Safe While you are in the Hospital'    from [id number] via phone number: [phone] at 7:33:07 AM    Description: This program outlines some of the precautions patients can take to ensure a speedy recovery without extra complications. The video emphasizes the importance of communicating with the healthcare team.    ----------DocumentID: [id number]------------------------------------------------                       [facility]          Patient Education Report - Discharge Summary        Date: 08/26/2018   Time: 4:58:16 PM   Name: Travis [name], Travis [name]   MRN: [id number]      Account Number: [id number]      Education History:        Patient ordered video: 'Patient Safety: Stay Safe While you are in the Hospital' from [id number] on 08/19/2018 07:33:07 AM

## 2018-08-26 NOTE — Progress Notes (Signed)
Problem: Pain  Goal: *Control of Pain  08/26/2018 0409 by Hama, Selina  Outcome: Progressing Towards Goal  08/26/2018 0409 by Hama, Selina  Outcome: Progressing Towards Goal

## 2018-08-26 NOTE — Progress Notes (Signed)
Patient and mother wanted to get prescriptions filled here at outpatient pharmacy. Unable to fill her d/t both insurances not accepted at our outpatient pharmacy and copay would of been $3,000+. Discussed with case management and dr mooney that patient can take his paper scripts to any private pharmacy, health dept or [facility] clinic to get them filled. Patient was unhappy with responses. Expressed displeasure with "the lack of explanation of how to get his meds covered" and asked to speak with patient advocate. Tina, Charge RN attempted to call patient advocate but was unsuccessful. Unable to leave message because the line just kept hanging up on her. Patient and mother were able to talk to private pharmacy about filling medications. Patient discharged home with mother.

## 2018-08-26 NOTE — Progress Notes (Signed)
Problem: Pain  Goal: *Control of Pain  Outcome: Progressing Towards Goal

## 2018-08-26 NOTE — Progress Notes (Signed)
Discharge Plan:   return home with family and FU with his PCP and with the HIV clinic at [facility] for continued outpatient treatment and routine health maintenance as before.  No DME needed.  Family will transport home    Discharge Date:     08/26/2018     TCC Referral:     N    Medication Assistance given    N Y/N       meds given (please list)     N    Change(MDC/SSDI) Referral/outcome    N     Transportation: Family/ Medical    Family    Transport Time:    TBD    Family, MD, patient, nurse and facility aware of pickup time

## 2018-08-26 NOTE — Progress Notes (Signed)
INFECTIOUS DISEASE PROGRESS NOTE       Requested by: Dr Sandie AnoSimoes    Reason for consult: N/V/D in immunocopromised host    Date of admission: 08/18/2018    ABX:      Current abx Prior abx    Fluconazole 6/24     7 of 14 d   Bactrim prophylaxis 6/26 till cd4 > 200  biktarvy started 6/26   nitazoxinide 6/13    11d  Flagyl 250 mg po bid                   2   x 7 d  Albendazole 400 mg bid  6/24   3       ASSESSMENT:        N/V/D/ dehydration    Uncontrolled parasitic infection of GI ( see below) likely-     Giardia should have responded but 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 in HIV- reconstitution of immune system with ART will help immensely     start ART as genomic testing reviewed-see below      initiate albendazol/flagyl  For rx of recalcitrant giardia-see below    Oral candidiasis and suspect esophagitis- improving      cryptosporidia/ Giardia concomitant infection   source unknown     D 11 of nitazoxinide PTA (see above)     6/24 now neg for giardia and persistent + cryptosporidia    Severe/constant nausea        HIV- new diagnosis    VL > 1 mil    cd4  17 abs/ 2%    hiv genomic testing for resistance sent 6/15- no resistance [ results available 6/26 ]  Started Biktarvy 6/26       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    ho neutropenia with adequate anc    nkda            RECOMMENDATIONS:    Severe nausea- hence flagyl d/ced and repeat giardia ag neg     -Expect cryptosporidia to resolve with immune reconstitution - started on Biktarvy 6/26 as genotype neg for any resistance  - cont imodium scheduled atleast twice a day - entered in system   -D/w Dr Sandie AnoSimoes, if his diarrhea is better on imodium, can use 3-4 times a day prn to control it as likely will take weeks before his CD4 count will come up. Can go home on imodium prn once diarrhea better controlled.  - cont high fiber , low fat , frequent small meals. Avoid caffeine and lactose [milk product]   - Rehydration per IM  - Agree with fluconazole- cont till CD4 above atleast 100, ideally recommended till CD4 around 200  - Hold azithro for now- if no nausea, restart next week  - cont pcp prophylaxis w/ bactrim  - Cont Biktarvy  - f/up EVMS ID post discharge - d/w pt if he is unable to get discharged  then should call and postpone appointment for next week.       MICROBIOLOGY:       PTA  5/27  Neg c diff  6/2    Stool culture neg  6/12  + for BOTH giardia and cryptosporidia-- lab repeated test with same results    Currrent  6/24  Stool neg CDI           Stool cullture IP           Crypto ag positive  Giardia ag neg     cc   Pt is feeling slightly better today. Says his nausea is well controlled with antiemetics. Also his diarrhea is better, so far gone twice since morning, also yesterday says his diarrhea was better, small amounts. Weakness present which will take some times.     Current Medications:     Current Facility-Administered Medications   Medication Dose Route Frequency   ??? loperamide (IMODIUM) capsule 2 mg  2 mg Oral BID   ??? sodium bicarbonate tablet 650 mg  650 mg Oral TID   ??? dextrose 5% - 0.45% NaCl with KCl 20 mEq/L infusion  75 mL/hr IntraVENous CONTINUOUS   ??? loperamide (IMODIUM) capsule 2 mg  2 mg Oral Q4H PRN   ??? bictegrav-emtricit-tenofov ala (BIKTARVY) 50-200-25 mg tablet 1 Tab  1 Tab Oral DAILY   ??? ondansetron (ZOFRAN) injection 4 mg  4 mg IntraVENous Q6H   ??? promethazine (PHENERGAN) 12.5 mg in NS 50 mL IVPB  12.5 mg IntraVENous Q6H PRN   ??? oxyCODONE-acetaminophen (PERCOCET) 5-325 mg per tablet 1 Tab  1 Tab Oral Q6H PRN   ??? acetaminophen (TYLENOL) tablet 650 mg  650 mg Oral Q6H PRN   ??? albuterol-ipratropium (DUO-NEB) 2.5 MG-0.5 MG/3 ML  3 mL Nebulization Q6H PRN   ??? naloxone (NARCAN) injection 0.1 mg  0.1 mg IntraVENous PRN   ??? enoxaparin (LOVENOX) injection 30 mg  30 mg SubCUTAneous Q24H   ??? fluconazole (DIFLUCAN) 200mg /100 mL IVPB (premix)  200 mg IntraVENous Q24H    ??? trimethoprim-sulfamethoxazole (BACTRIM DS, SEPTRA DS) 160-800 mg per tablet 1 Tab  1 Tab Oral Q MON, WED & FRI         Physical Exam:  Vitals  Temp (24hrs), Avg:98 ??F (36.7 ??C), Min:97.5 ??F (36.4 ??C), Max:98.6 ??F (37 ??C)    Visit Vitals  BP 117/73 (BP 1 Location: Left arm, BP Patient Position: Lying right side)   Pulse 74   Temp 97.5 ??F (36.4 ??C)   Resp 14   Ht 5\' 9"  (1.753 m)   Wt 66.4 kg (146 lb 6.2 oz)   SpO2 100%   BMI 21.62 kg/m??       General: Well-developed, 28 y.o. year-old, male, in no acute distress  HEENT: Normocephalic, anicteric sclerae, Pupils equal, round reactive to light, no oropharyngeal lesions. Thrush improved  NChest: Symmetrical expansion  Abdomen: Soft, TTP, diffusely. No rebound.non distended, no organomegaly, BS+.  Musculoskeletal: Normal strength/tone. No edema. No clubbing or cyanosis  CNS: AAOx3. Cranial nerves II-XII intact. Grossly normal.No NR  SKIN: No skin lesion or rash. Dry, warm, intact          Labs: Results:   Chemistry Recent Labs     08/26/18  0323 08/25/18  0927 08/24/18  0626   GLU 82 80 87   NA 142 142 142   K 4.1 4.0 4.0   CL 114* 115* 116*   CO2 21 22 19*   BUN 12 10 8    CREA 0.9 0.9 1.0   CA 7.5* 7.4* 7.5*   AGAP 7 6 7    AP  --  71  --    TP  --  5.9*  --    ALB  --  2.2*  --       CBC w/Diff Recent Labs     08/26/18  0323 08/25/18  0927 08/24/18  0626   WBC 3.5* 2.8* 4.7   RBC 4.22 4.22 4.33   HGB 11.8* 11.8*  12.1*   HCT 34.5* 34.7* 35.2*   PLT 166 159 197      Microbiology No results for input(s): CULT in the last 72 hours.       Imaging-    Results from Hospital Encounter encounter on 08/18/18   XR CHEST SNGL V    Narrative INDICATION:  SOB       EXAMINATION:  XR CHEST SNGL V    COMPARISON:  02/14/2016    FINDINGS:  The study shows a normal sized heart.. The lungs are clear and well expanded.           Impression IMPRESSION:  Normal chest         Results from Hospital Encounter encounter on 08/06/18   CT ABD PELV WO CONT    Narrative CT Abdomen and Pelvis without     Indication: Abdominal pain. Nausea, vomiting, diarrhea.    Comparison: 07/22/2018.    TECHNIQUE:   CT of the abdomen and pelvis WITHOUT intravenous contrast. Coronal and sagittal  reformations were obtained. Oral contrast was given.    All CT exams at this facility use one or more dose reduction techniques  including automatic exposure control, mA/kV adjustment per patient's size, or  iterative reconstruction technique. DICOM format imaged data is available to  non-affiliated external healthcare facilities or entities on a secure, media  free, reciprocal searchable basis with patient authorization for 12 months  following the date of the study.    DISCUSSION:    ABSENCE OF INTRAVENOUS CONTRAST DECREASES SENSITIVITY FOR DETECTION OF FOCAL  LESIONS AND VASCULAR PATHOLOGY.    LOWER THORAX: Normal.    HEPATOBILIARY: No focal hepatic lesions.  No biliary ductal dilatation.  SPLEEN: No splenomegaly.  PANCREAS: No focal masses or ductal dilatation.    ADRENALS: No adrenal nodules.  KIDNEYS/URETERS: No hydronephrosis, stones, or solid mass lesions.  PELVIC ORGANS/BLADDER: Unremarkable.    PERITONEUM / RETROPERITONEUM: No free air or fluid.  LYMPH NODES: No lymphadenopathy.  VESSELS: Unremarkable.    GI TRACT: Multiple nondistended fluid-filled loops of small bowel with  questionable mild wall thickening. Fluid-filled colon with no significant wall  thickening. No obstruction. Normal appendix.    BONES AND SOFT TISSUES: No acute abnormality.      Impression IMPRESSION:    Numerous fluid-filled loops of small and large bowel with borderline diffuse  small bowel wall thickening, concerning for infectious or inflammatory  enterocolitis. Findings have slightly progressed since 07/22/2018.         ---------------------------------------------------------------------------------------------------------------  I have independently examined the patient and reviewed all lab studies and  imgaing as well as review of nursing notes and physican notes     32 min spent with > 50% of time spent counseling and coordination of care    Alfonso EllisMartha Shadrach Bartunek, MD  08/26/2018    [facility]  [phone]

## 2018-08-26 NOTE — Discharge Summary (Signed)
Discharge Summary   Admit Date: 08/18/2018  Discharge Date:  08/26/2018    Patient ID:  Travis [name]  28 y.o.  [birth year]    Chief Complaint   Patient presents with   ??? Vomiting   ??? Diarrhea   ??? Abdominal Pain   ??? Chills       Patient Active Problem List    Diagnosis Date Noted   ??? Diarrhea 24/58/0998   ??? Metabolic acidosis 33/82/5053   ??? HIV (human immunodeficiency virus infection) ([place]) 08/19/2018   ??? Dehydration 08/06/2018   ??? Enterocolitis 08/06/2018   ??? Intractable nausea and vomiting 08/06/2018   ??? AKI (acute kidney injury) ([place]) 07/22/2018       Discharge Diagnosis:    1. Nausea vomiting and dehydration  2. Diarrhea due to Giardia and due to Cryptosporidium  3. Acute renal failure due to pre-renal state due to #1 POA  4. Metabolic acidosis due to #3 improved  5. Oral candidiasis  6. Recently diagnosed HIV--AIDS  7. CD4 count 17  8. Severe protein calorie malnutrition  9. 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     Current Discharge Medication List      START taking these medications    Details   bictegrav-emtricit-tenofov ala (BIKTARVY) tab tablet Take 1 Tab by mouth daily.  Qty: 30 Tab, Refills: 1      fluconazole (Diflucan) 200 mg tablet Take 1 Tab by mouth daily for 30 days. FDA advises cautious prescribing of oral fluconazole in pregnancy.  Qty: 30 Tab, Refills: 0      loperamide (IMODIUM) 2 mg capsule Take 1 Cap by mouth every four (4) hours as needed for Diarrhea for up to 10 days.  Qty: 30 Cap, Refills: 0      promethazine (PHENERGAN) 12.5 mg tablet Take 1 Tab by mouth every six (6) hours as needed for Nausea.  Qty: 20 Tab, Refills: 0         CONTINUE these medications which have CHANGED    Details   trimethoprim-sulfamethoxazole (BACTRIM DS, SEPTRA DS) 160-800 mg per tablet Take 1 Tab by mouth every Monday, Wednesday, Friday.  Qty: 30 Tab, Refills: 1         CONTINUE these medications which have NOT CHANGED    Details   albuterol (PROAIR HFA) 90 mcg/actuation inhaler Take 1 Puff by inhalation  every four (4) hours as needed for Wheezing.  Qty: 1 Inhaler, Refills: 0         STOP taking these medications       nitazoxanide (Alinia) 500 mg tablet Comments:   Reason for Stopping:         potassium chloride (K-DUR, KLOR-CON) 20 mEq tablet Comments:   Reason for Stopping:         dicyclomine (BENTYL) 10 mg capsule Comments:   Reason for Stopping:         ondansetron (ZOFRAN ODT) 4 mg disintegrating tablet Comments:   Reason for Stopping:         azithromycin (ZITHROMAX) 600 mg tablet Comments:   Reason for Stopping:         omeprazole (PRILOSEC) 40 mg capsule Comments:   Reason for Stopping:               Operative Procedures:  none    Consultants:  ID    Hospital Course:  [name] is a 28 year old man recently diagnosed with HIV and hospitalized 6/11 to 6/17 who has had diarrhea  due to Giardia and Cryptosporidium. His diarrhea improved some and he as DC'ed home with a Rx for Nitazoxanide. Since his return home he felt better for a couple of days but then began having diarrhea again multiple times per day. He presented to the ED and was found to have an elevated creatinine and a metabolic acidosis consistent with renal failure. Admission was recommended for further management.     He was admitted and [name] NS was continued his home medications were reconciled and continued. ID was consulted. Albendazole 432m bid plus flagyl were started. Fluconazole was started for oral candidiasis.. Azthromycin regarding MAC prophylaxis was held and PCP prophylaixs with Bactrim  was changed to MWF from daily. Genomic testing for HIV resistence was also ordered.     Per ID recs.   Severe nausea- hence flagyl d/ced and repeat giardia ag neg   -Expect cryptosporidia to resolve with immune reconstitution - started on Biktarvy 6/26 as genotype neg for any resistance   - cont imodium scheduled atleast twice a day - entered in system   -D/w Dr SNinetta Lights if his diarrhea is better on imodium, can use 3-4 times a  day prn to control it as likely will take weeks before his CD4 count will come up. Can go home on imodium prn once diarrhea better controlled.   - cont high fiber , low fat , frequent small meals. Avoid caffeine and lactose [milk product]   - Rehydration per IM   - Agree with fluconazole- cont till CD4 above atleast 100, ideally recommended till CD4 around 200   - Hold azithro for now- if no nausea, restart next week   - cont pcp prophylaxis w/ bactrim   - Cont Biktarvy   - f/up EVMS ID post discharge - d/w pt if he is unable to get discharged today then should call and postpone appointment for next week    His diarrhea has subsided and he is ambulatory with minimal assisance. He is feeling better and will return home with family and [facility] before.     Physical Exam on Discharge:  Visit Vitals  BP 127/80 (BP 1 Location: Left arm, BP Patient Position: Supine)   Pulse 73   Temp 98.1 ??F (36.7 ??C)   Resp 20   Ht _0  (1.753 m)   Wt 66.4 kg (146 lb 6.2 oz)   SpO2 100%   BMI 21.62 kg/m??         Constitutional: well developed, nourished, no distress and alert and oriented x 3   HENT: atraumatic, nose normal, normocephalic, left exterior ear normal, right external ear normal and oropharynx clear and moist   Eyes: conjunctiva normal, EOM normal and PERRL   Neck: ROM normal, supple, trachea normal and cervical adenopathy   Cardiovascular: heart sounds normal, intact distal pulses, normal rate and regular rhythm   Pulmonary/Chest Wall: breath sounds normal and effort normal   Abdominal: appearance normal, bowel sounds normal and soft   Genitourinary/Anorectal: deferred   Musculoskeletal: normal ROM   Neurological: awake, alert and oriented x 3 and gait normal   Skin: dry, intact and warm   Psych:  appropriate               Most Recent BMP and CBC:    Basic Metabolic Profile   Lab Results   Component Value Date/Time     Sodium 142  08/26/2018 03:23 AM    Sodium 142 08/25/2018 09:27 AM    Sodium 142 08/24/2018 06:26 AM    Potassium 4.1 08/26/2018 03:23 AM    Potassium 4.0 08/25/2018 09:27 AM    Potassium 4.0 08/24/2018 06:26 AM    Chloride 114 (H) 08/26/2018 03:23 AM    Chloride 115 (H) 08/25/2018 09:27 AM    Chloride 116 (H) 08/24/2018 06:26 AM    CO2 21 08/26/2018 03:23 AM    CO2 22 08/25/2018 09:27 AM    CO2 19 (L) 08/24/2018 06:26 AM    Anion gap 7 08/26/2018 03:23 AM    Anion gap 6 08/25/2018 09:27 AM    Anion gap 7 08/24/2018 06:26 AM    Glucose 82 08/26/2018 03:23 AM    Glucose 80 08/25/2018 09:27 AM    Glucose 87 08/24/2018 06:26 AM    BUN 12 08/26/2018 03:23 AM    BUN 10 08/25/2018 09:27 AM    BUN 8 08/24/2018 06:26 AM    Creatinine 0.9 08/26/2018 03:23 AM    Creatinine 0.9 08/25/2018 09:27 AM    Creatinine 1.0 08/24/2018 06:26 AM    GFR est AA >60 08/26/2018 03:23 AM    GFR est AA >60 08/25/2018 09:27 AM    GFR est AA >60 08/24/2018 06:26 AM    GFR est non-AA >60 08/26/2018 03:23 AM    GFR est non-AA >60 08/25/2018 09:27 AM    GFR est non-AA >60 08/24/2018 06:26 AM    Calcium 7.5 (L) 08/26/2018 03:23 AM    Calcium 7.4 (L) 08/25/2018 09:27 AM    Calcium 7.5 (L) 08/24/2018 06:26 AM           CBC w/Diff    Lab Results   Component Value Date/Time    WBC 3.5 (L) 08/26/2018 03:23 AM    WBC 2.8 (L) 08/25/2018 09:27 AM    WBC 4.7 08/24/2018 06:26 AM    HGB 11.8 (L) 08/26/2018 03:23 AM    HGB 11.8 (L) 08/25/2018 09:27 AM    HGB 12.1 (L) 08/24/2018 06:26 AM    HCT 34.5 (L) 08/26/2018 03:23 AM    HCT 34.7 (L) 08/25/2018 09:27 AM    HCT 35.2 (L) 08/24/2018 06:26 AM    PLATELET 166 08/26/2018 03:23 AM    PLATELET 159 08/25/2018 09:27 AM    PLATELET 197 08/24/2018 06:26 AM    MCV 81.8 08/26/2018 03:23 AM    MCV 82.2 08/25/2018 09:27 AM    MCV 81.3 08/24/2018 06:26 AM       Recent Labs     08/26/18  0323 08/25/18  0927 08/24/18  0626   WBC 3.5* 2.8* 4.7   RBC 4.22 4.22 4.33   HGB 11.8* 11.8* 12.1*   HCT 34.5* 34.7* 35.2*    PLT 166 159 197              Condition at discharge: Afebrile  Ambulating  Eating, Drinking, Voiding  Stable    Disposition:  Home    Home or Self Care    PCP:  Janell Quiet, MD

## 2018-08-26 NOTE — Progress Notes (Signed)
Patient meets severe malnutrition criteria. If in agreement, please document in Problem List.    NUTRITION FOLLOW UP    Current diet order:   DIET NUTRITIONAL SUPPLEMENTS All Meals; Ensure Pudding  DIET NUTRITIONAL SUPPLEMENTS Breakfast, Dinner; Fibercel (fiber supplement)  DIET REGULAR    Current intake: []  N/A- NPO    []  very poor     []  poor      []  fair      [x]  good    Pertinent Meds: D5 1/2 NaCl w/ KCl @ 75 ml/hr, biktarvy, zofran    Pertinent Labs: reviewed    Weight: Admit wt (6/23) = 135 lb.  Last 3 Recorded Weights in this Encounter    08/19/18 0248 08/20/18 2040 08/22/18 0325   Weight: 61.6 kg (135 lb 12.9 oz) 62 kg (136 lb 11 oz) 66.4 kg (146 lb 6.2 oz)       BMI: Body mass index is 21.62 kg/m??.    Physical assessment:  ?? Chewing/swallowing issues:   None reported  ?? GI symptoms:    Pt diarrhea is improved   Last Bowel Movement Date: 08/25/18   Stool Appearance: Loose   Abdominal Assessment: Diarrhea   Bowel Sounds: Active   ?? Fluid retention:   none     ?? Skin integrity:   intact       Nutrition diagnosis:   1. Severe??Protein Calorie Malnutrition in context of chronic??illness related to suboptimal protein-energy intake 2/2 constant n/v/d and newly dx HIV??as evidenced by weight loss of??35??lb (20.5%) x??2-3 months, </=50% of estimated energy requirements for >/=1 month. - continues    Assessment of Current MNT: Diet seems appropriate, adequate to meet nutrition needs if PO intake >75% with meals. Oral supplements to increase protein-calorie intake opportunity.    Nutrition recommendation:   ? Continue current MNT.  ? Wts 3x week to trend.     Monitoring and evaluation: PO intake, diet tolerance and compliance, wt trend, nutrition-related labs, hydration, s/sx of new skin concerns, medical changes; will follow up per policy.    Nutrition goals: Pt to meet/tolerate >75% of estimated needs, maintain weight throughout LOS, nutrition-related labs WNL, maintain skin integrity.     Nutrition level of care: [x]  low     []  moderate     []  High    Progress towards nutrition goals: []   Met/Ongoing     [x]   Progressing appropriately       []   Progressing slowly      []   Not Progressing    Code status: Full Code    Discharge Placement: Home with home health  Comfort Care: Yes    Norton Blizzard, [place], [place]  08/26/18  Pager # [phone]

## 2018-08-26 NOTE — Progress Notes (Signed)
Progress  Notes by Alfonso EllisMooney, [place] Crance, MD at 08/26/18 1019                Author: Alfonso EllisMooney, Aimar Shrewsbury, MD  Service: Infectious Disease  Author Type: Physician       Filed: 08/26/18 1641  Date of Service: 08/26/18 1019  Status: Signed          Editor: Alfonso EllisMooney, Kaileia Flow, MD (Physician)                                                                                       INFECTIOUS DISEASE PROGRESS NOTE          Requested by: Dr Sandie AnoSimoes      Reason for consult: N/V/D in immunocopromised host      Date of admission: 08/18/2018        ABX:            Current abx  Prior abx      Fluconazole 6/24     7 of 14 d    Bactrim prophylaxis 6/26 till cd4 > 200   biktarvy started 6/26    nitazoxinide 6/13    11d   Flagyl 250 mg po bid                   2   x 7 d   Albendazole 400 mg bid  6/24   3            ASSESSMENT:             N/V/D/ dehydration     Uncontrolled parasitic infection of GI ( see below) likely-      Giardia should have responded but 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 in HIV- reconstitution of immune system with ART will help immensely      start ART as genomic testing reviewed-see below       initiate albendazol/flagyl  For rx of recalcitrant giardia-see below      Oral candidiasis and suspect esophagitis - improving         cryptosporidia/ Giardia concomitant infection    source unknown      D 11 of nitazoxinide PTA (see above)      6/24 now neg for giardia and persistent + cryptosporidia     Severe/constant nausea           HIV- new diagnosis     VL > 1 mil     cd4  17 abs/ 2%     hiv genomic testing for resistance sent 6/15- no resistance [ results available 6/26 ]   Started Biktarvy 6/26          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      ho neutropenia with adequate anc      nkda                   RECOMMENDATIONS:      Severe nausea- hence flagyl d/ced and repeat giardia ag neg       -Expect cryptosporidia to resolve with immune reconstitution - started on Biktarvy 6/26 as genotype neg for any resistance   - cont  imodium scheduled atleast twice a day - entered in system    -D/w Dr Ninetta Lights, if his diarrhea is better on imodium, can use 3-4 times a day prn to control it as likely will take weeks before his CD4 count will come up. Can go home on imodium prn once diarrhea better controlled.   - cont high fiber , low fat , frequent small meals. Avoid caffeine and lactose [milk product]   - Rehydration per IM   - Agree with fluconazole- cont till CD4 above atleast 100, ideally recommended till CD4 around 200   - Hold azithro for now- if no nausea, restart next week   - cont pcp prophylaxis w/ bactrim   - Cont Biktarvy   - f/up [facility] ID post discharge - d/w pt if he is unable to get discharged  then should call and postpone appointment for next week.            MICROBIOLOGY:          PTA   5/27  Neg c diff   6/2    Stool culture neg   6/12  + for BOTH giardia and cryptosporidia-- lab repeated test with same results      Currrent   6/24  Stool neg CDI            Stool cullture IP            Crypto ag positive            Giardia ag neg         cc     Pt is feeling slightly better today. Says his nausea is well controlled with antiemetics. Also his diarrhea is better, so far gone twice since morning, also  yesterday says his diarrhea was better, small amounts. Weakness present which will take some times.         Current Medications:          Current Facility-Administered Medications          Medication  Dose  Route  Frequency           ?  loperamide (IMODIUM) capsule 2 mg   2 mg  Oral  BID     ?  sodium bicarbonate tablet 650 mg   650 mg  Oral  TID     ?  dextrose 5% - 0.45% NaCl with KCl 20 mEq/L infusion   75 mL/hr  IntraVENous  CONTINUOUS     ?  loperamide (IMODIUM) capsule 2 mg   2 mg  Oral  Q4H PRN           ?  bictegrav-emtricit-tenofov ala (BIKTARVY) 50-200-25 mg tablet 1 Tab   1 Tab  Oral  DAILY           ?  ondansetron (ZOFRAN) injection 4 mg   4 mg  IntraVENous  Q6H     ?  promethazine (PHENERGAN) 12.5 mg in NS 50 mL IVPB    12.5 mg  IntraVENous  Q6H PRN     ?  oxyCODONE-acetaminophen (PERCOCET) 5-325 mg per tablet 1 Tab   1 Tab  Oral  Q6H PRN     ?  acetaminophen (TYLENOL) tablet 650 mg   650 mg  Oral  Q6H PRN     ?  albuterol-ipratropium (DUO-NEB) 2.5 MG-0.5 MG/3 ML   3 mL  Nebulization  Q6H PRN     ?  naloxone (NARCAN) injection 0.1 mg   0.1 mg  IntraVENous  PRN     ?  enoxaparin (LOVENOX) injection 30 mg   30 mg  SubCUTAneous  Q24H     ?  fluconazole (DIFLUCAN) 200mg /100 mL IVPB (premix)   200 mg  IntraVENous  Q24H           ?  trimethoprim-sulfamethoxazole (BACTRIM DS, SEPTRA DS) 160-800 mg per tablet 1 Tab   1 Tab  Oral  Q MON, WED & FRI              Physical Exam:   Vitals   Temp (24hrs), Avg:98 ??F (36.7 ??C), Min:97.5 ??F (36.4 ??C), Max:98.6 ??F (37 ??C)      Visit Vitals      BP  117/73 (BP 1 Location: Left arm, BP Patient Position: Lying right side)     Pulse  74     Temp  97.5 ??F (36.4 ??C)     Resp  14     Ht  5\' 9"  (1.753 m)     Wt  66.4 kg (146 lb 6.2 oz)     SpO2  100%        BMI  21.62 kg/m??           General: Well-developed, 28 y.o. year-old, male , in no acute distress   HEENT: Normocephalic, anicteric sclerae, Pupils equal, round reactive to light, no oropharyngeal lesions. Thrush improved   NChest: Symmetrical expansion   Abdomen: Soft, TTP, diffusely. No rebound.non distended, no organomegaly, BS+.   Musculoskeletal: Normal strength/tone. No edema. No clubbing or cyanosis   CNS: AAOx3. Cranial nerves II-XII intact. Grossly normal.No NR   SKIN: No skin lesion or rash. Dry, warm, intact                 Labs:  Results:        Chemistry  Recent Labs         08/26/18   0323  08/25/18   0927  08/24/18   0626      GLU  82  80  87      NA  142  142  142      K  4.1  4.0  4.0      CL  114*  115*  116*      CO2  21  22  19*      BUN  12  10  8       CREA  0.9  0.9  1.0      CA  7.5*  7.4*  7.5*      AGAP  7  6  7       AP   --   71   --       TP   --   5.9*   --       ALB   --   2.2*   --               CBC w/Diff  Recent Labs          08/26/18   0323  08/25/18   0927  08/24/18   0626      WBC  3.5*  2.8*  4.7      RBC  4.22  4.22  4.33      HGB  11.8*  11.8*  12.1*      HCT  34.5*  34.7*  35.2*      PLT  166  159  197  Microbiology  No results for input(s): CULT in the last 72 hours.           Imaging-        Results from Hospital Encounter encounter on 08/18/18     XR CHEST SNGL V           Narrative  INDICATION:   SOB         EXAMINATION:   XR CHEST SNGL V      COMPARISON:   02/14/2016      FINDINGS:   The study shows a normal sized heart.. The lungs are clear and well expanded.                    Impression  IMPRESSION:   Normal chest                Results from Hospital Encounter encounter on 08/06/18     CT ABD PELV WO CONT           Narrative  CT Abdomen and Pelvis without      Indication: Abdominal pain. Nausea, vomiting, diarrhea.      Comparison: 07/22/2018.      TECHNIQUE:    CT of the abdomen and pelvis WITHOUT intravenous contrast. Coronal and sagittal   reformations were obtained. Oral contrast was given.      All CT exams at this facility use one or more dose reduction techniques   including automatic exposure control, mA/kV adjustment per patient's size, or   iterative reconstruction technique. DICOM format imaged data is available to   non-affiliated external healthcare facilities or entities on a secure, media   free, reciprocal searchable basis with patient authorization for 12 months   following the date of the study.      DISCUSSION:      ABSENCE OF INTRAVENOUS CONTRAST DECREASES SENSITIVITY FOR DETECTION OF FOCAL   LESIONS AND VASCULAR PATHOLOGY.      LOWER THORAX: Normal.      HEPATOBILIARY: No focal hepatic lesions.  No biliary ductal dilatation.   SPLEEN: No splenomegaly.   PANCREAS: No focal masses or ductal dilatation.      ADRENALS: No adrenal nodules.   KIDNEYS/URETERS: No hydronephrosis, stones, or solid mass lesions.   PELVIC ORGANS/BLADDER: Unremarkable.      PERITONEUM / RETROPERITONEUM: No free air or  fluid.   LYMPH NODES: No lymphadenopathy.   VESSELS: Unremarkable.      GI TRACT: Multiple nondistended fluid-filled loops of small bowel with   questionable mild wall thickening. Fluid-filled colon with no significant wall   thickening. No obstruction. Normal appendix.      BONES AND SOFT TISSUES: No acute abnormality.              Impression  IMPRESSION:      Numerous fluid-filled loops of small and large bowel with borderline diffuse   small bowel wall thickening, concerning for infectious or inflammatory   enterocolitis. Findings have slightly progressed since 07/22/2018.              ---------------------------------------------------------------------------------------------------------------   I have independently examined the patient and reviewed all lab studies and imgaing as well as review of nursing notes and physican notes       32 min spent with > 50% of time spent counseling and coordination of care      Alfonso EllisMartha Abriella Filkins, MD   08/26/2018      [facility]   [phone]

## 2018-08-26 NOTE — Progress Notes (Signed)
Patient meets severe malnutrition criteria. If in agreement, please document in Problem List.    NUTRITION FOLLOW UP    Current diet order:   DIET NUTRITIONAL SUPPLEMENTS All Meals; Ensure Pudding  DIET NUTRITIONAL SUPPLEMENTS Breakfast, Dinner; Fibercel (fiber supplement)  DIET REGULAR    Current intake: []  N/A- NPO    []  very poor     []  poor      []  fair      [x]  good    Pertinent Meds: D5 1/2 NaCl w/ KCl @ 75 ml/hr, biktarvy, zofran    Pertinent Labs: reviewed    Weight: Admit wt (6/23) = 135 lb.  Last 3 Recorded Weights in this Encounter    08/19/18 0248 08/20/18 2040 08/22/18 0325   Weight: 61.6 kg (135 lb 12.9 oz) 62 kg (136 lb 11 oz) 66.4 kg (146 lb 6.2 oz)       BMI: Body mass index is 21.62 kg/m.    Physical assessment:   Chewing/swallowing issues:   None reported   GI symptoms:    Pt diarrhea is improved   Last Bowel Movement Date: 08/25/18   Stool Appearance: Loose   Abdominal Assessment: Diarrhea   Bowel Sounds: Active    Fluid retention:   none      Skin integrity:   intact       Nutrition diagnosis:   1. SevereProtein Calorie Malnutrition in context of chronicillness related to suboptimal protein-energy intake 2/2 constant n/v/d and newly dx HIVas evidenced by weight loss of35lb (20.5%) x2-3 months, </=50% of estimated energy requirements for >/=1 month. - continues    Assessment of Current MNT: Diet seems appropriate, adequate to meet nutrition needs if PO intake >75% with meals. Oral supplements to increase protein-calorie intake opportunity.    Nutrition recommendation:   . Continue current MNT.  . Wts 3x week to trend.     Monitoring and evaluation: PO intake, diet tolerance and compliance, wt trend, nutrition-related labs, hydration, s/sx of new skin concerns, medical changes; will follow up per policy.    Nutrition goals: Pt to meet/tolerate >75% of estimated needs, maintain weight throughout LOS, nutrition-related labs WNL, maintain skin integrity.    Nutrition level of care: [x]  low      []  moderate     []  High    Progress towards nutrition goals: []   Met/Ongoing     [x]   Progressing appropriately       []   Progressing slowly      []   Not Progressing    Code status: Full Code    Discharge Placement: Home with home health  Comfort Care: Yes    Nilsa Nutting, MS, RD  08/26/18  Pager # [phone]

## 2018-08-26 NOTE — Progress Notes (Signed)
Problem: Pain  Goal: *Control of Pain  08/26/2018 0409 by Hughie Closs  Outcome: Progressing Towards Goal  08/26/2018 0409 by Hughie Closs  Outcome: Progressing Towards Goal

## 2018-08-26 NOTE — Progress Notes (Signed)
Discharge Plan:   return home with family and FU with his PCP and with the HIV clinic at [facility] for continued outpatient treatment and routine health maintenance as before.  No DME needed.  Family will transport home    Discharge Date:     08/26/2018     TCC Referral:     N    Medication Assistance given    N Y/N       meds given (please list)     N    Change(MDC/SSDI) Referral/outcome    N     Transportation: Family/ Medical    Family    Transport Time:    TBD    Family, MD, patient, nurse and facility aware of pickup time

## 2018-08-26 NOTE — Discharge Summary (Signed)
Discharge Summary   Admit Date: 08/18/2018  Discharge Date:  08/26/2018    Patient ID:  Travis [name]  28 y.o.  [birth year]    Chief Complaint   Patient presents with   ??? Vomiting   ??? Diarrhea   ??? Abdominal Pain   ??? Chills       Patient Active Problem List    Diagnosis Date Noted   ??? Diarrhea 24/58/0998   ??? Metabolic acidosis 33/82/5053   ??? HIV (human immunodeficiency virus infection) ([place]) 08/19/2018   ??? Dehydration 08/06/2018   ??? Enterocolitis 08/06/2018   ??? Intractable nausea and vomiting 08/06/2018   ??? AKI (acute kidney injury) ([place]) 07/22/2018       Discharge Diagnosis:    1. Nausea vomiting and dehydration  2. Diarrhea due to Giardia and due to Cryptosporidium  3. Acute renal failure due to pre-renal state due to #1 POA  4. Metabolic acidosis due to #3 improved  5. Oral candidiasis  6. Recently diagnosed HIV--AIDS  7. CD4 count 17  8. Severe protein calorie malnutrition  9. 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     Current Discharge Medication List      START taking these medications    Details   bictegrav-emtricit-tenofov ala (BIKTARVY) tab tablet Take 1 Tab by mouth daily.  Qty: 30 Tab, Refills: 1      fluconazole (Diflucan) 200 mg tablet Take 1 Tab by mouth daily for 30 days. FDA advises cautious prescribing of oral fluconazole in pregnancy.  Qty: 30 Tab, Refills: 0      loperamide (IMODIUM) 2 mg capsule Take 1 Cap by mouth every four (4) hours as needed for Diarrhea for up to 10 days.  Qty: 30 Cap, Refills: 0      promethazine (PHENERGAN) 12.5 mg tablet Take 1 Tab by mouth every six (6) hours as needed for Nausea.  Qty: 20 Tab, Refills: 0         CONTINUE these medications which have CHANGED    Details   trimethoprim-sulfamethoxazole (BACTRIM DS, SEPTRA DS) 160-800 mg per tablet Take 1 Tab by mouth every Monday, Wednesday, Friday.  Qty: 30 Tab, Refills: 1         CONTINUE these medications which have NOT CHANGED    Details   albuterol (PROAIR HFA) 90 mcg/actuation inhaler Take 1 Puff by inhalation  every four (4) hours as needed for Wheezing.  Qty: 1 Inhaler, Refills: 0         STOP taking these medications       nitazoxanide (Alinia) 500 mg tablet Comments:   Reason for Stopping:         potassium chloride (K-DUR, KLOR-CON) 20 mEq tablet Comments:   Reason for Stopping:         dicyclomine (BENTYL) 10 mg capsule Comments:   Reason for Stopping:         ondansetron (ZOFRAN ODT) 4 mg disintegrating tablet Comments:   Reason for Stopping:         azithromycin (ZITHROMAX) 600 mg tablet Comments:   Reason for Stopping:         omeprazole (PRILOSEC) 40 mg capsule Comments:   Reason for Stopping:               Operative Procedures:  none    Consultants:  ID    Hospital Course:  [name] is a 28 year old man recently diagnosed with HIV and hospitalized 6/11 to 6/17 who has had diarrhea  due to Giardia and Cryptosporidium. His diarrhea improved some and he as DC'ed home with a Rx for Nitazoxanide. Since his return home he felt better for a couple of days but then began having diarrhea again multiple times per day. He presented to the ED and was found to have an elevated creatinine and a metabolic acidosis consistent with renal failure. Admission was recommended for further management.     He was admitted and [name] NS was continued his home medications were reconciled and continued. ID was consulted. Albendazole 434m bid plus flagyl were started. Fluconazole was started for oral candidiasis.. Azthromycin regarding MAC prophylaxis was held and PCP prophylaixs with Bactrim  was changed to MWF from daily. Genomic testing for HIV resistence was also ordered.     Per ID recs.   Severe nausea- hence flagyl d/ced and repeat giardia ag neg   -Expect cryptosporidia to resolve with immune reconstitution - started on Biktarvy 6/26 as genotype neg for any resistance   - cont imodium scheduled atleast twice a day - entered in system   -D/w Dr SNinetta Lights if his diarrhea is better on imodium, can use 3-4 times a day prn to control it as  likely will take weeks before his CD4 count will come up. Can go home on imodium prn once diarrhea better controlled.   - cont high fiber , low fat , frequent small meals. Avoid caffeine and lactose [milk product]   - Rehydration per IM   - Agree with fluconazole- cont till CD4 above atleast 100, ideally recommended till CD4 around 200   - Hold azithro for now- if no nausea, restart next week   - cont pcp prophylaxis w/ bactrim   - Cont Biktarvy   - f/up [facility] ID post discharge - d/w pt if he is unable to get discharged today then should call and postpone appointment for next week    His diarrhea has subsided and he is ambulatory with minimal assisance. He is feeling better and will return home with family and [facility].     Physical Exam on Discharge:  Visit Vitals  BP 127/80 (BP 1 Location: Left arm, BP Patient Position: Supine)   Pulse 73   Temp 98.1 ??F (36.7 ??C)   Resp 20   Ht _0  (1.753 m)   Wt 66.4 kg (146 lb 6.2 oz)   SpO2 100%   BMI 21.62 kg/m??         Constitutional: well developed, nourished, no distress and alert and oriented x 3   HENT: atraumatic, nose normal, normocephalic, left exterior ear normal, right external ear normal and oropharynx clear and moist   Eyes: conjunctiva normal, EOM normal and PERRL   Neck: ROM normal, supple, trachea normal and cervical adenopathy   Cardiovascular: heart sounds normal, intact distal pulses, normal rate and regular rhythm   Pulmonary/Chest Wall: breath sounds normal and effort normal   Abdominal: appearance normal, bowel sounds normal and soft   Genitourinary/Anorectal: deferred   Musculoskeletal: normal ROM   Neurological: awake, alert and oriented x 3 and gait normal   Skin: dry, intact and warm   Psych:  appropriate               Most Recent BMP and CBC:    Basic Metabolic Profile   Lab Results   Component Value Date/Time    Sodium 142 08/26/2018  03:23 AM    Sodium 142 08/25/2018 09:27 AM    Sodium 142 08/24/2018 06:26 AM    Potassium 4.1 08/26/2018 03:23 AM    Potassium 4.0 08/25/2018 09:27 AM    Potassium 4.0 08/24/2018 06:26 AM    Chloride 114 (H) 08/26/2018 03:23 AM    Chloride 115 (H) 08/25/2018 09:27 AM    Chloride 116 (H) 08/24/2018 06:26 AM    CO2 21 08/26/2018 03:23 AM    CO2 22 08/25/2018 09:27 AM    CO2 19 (L) 08/24/2018 06:26 AM    Anion gap 7 08/26/2018 03:23 AM    Anion gap 6 08/25/2018 09:27 AM    Anion gap 7 08/24/2018 06:26 AM    Glucose 82 08/26/2018 03:23 AM    Glucose 80 08/25/2018 09:27 AM    Glucose 87 08/24/2018 06:26 AM    BUN 12 08/26/2018 03:23 AM    BUN 10 08/25/2018 09:27 AM    BUN 8 08/24/2018 06:26 AM    Creatinine 0.9 08/26/2018 03:23 AM    Creatinine 0.9 08/25/2018 09:27 AM    Creatinine 1.0 08/24/2018 06:26 AM    GFR est AA >60 08/26/2018 03:23 AM    GFR est AA >60 08/25/2018 09:27 AM    GFR est AA >60 08/24/2018 06:26 AM    GFR est non-AA >60 08/26/2018 03:23 AM    GFR est non-AA >60 08/25/2018 09:27 AM    GFR est non-AA >60 08/24/2018 06:26 AM    Calcium 7.5 (L) 08/26/2018 03:23 AM    Calcium 7.4 (L) 08/25/2018 09:27 AM    Calcium 7.5 (L) 08/24/2018 06:26 AM           CBC w/Diff    Lab Results   Component Value Date/Time    WBC 3.5 (L) 08/26/2018 03:23 AM    WBC 2.8 (L) 08/25/2018 09:27 AM    WBC 4.7 08/24/2018 06:26 AM    HGB 11.8 (L) 08/26/2018 03:23 AM    HGB 11.8 (L) 08/25/2018 09:27 AM    HGB 12.1 (L) 08/24/2018 06:26 AM    HCT 34.5 (L) 08/26/2018 03:23 AM    HCT 34.7 (L) 08/25/2018 09:27 AM    HCT 35.2 (L) 08/24/2018 06:26 AM    PLATELET 166 08/26/2018 03:23 AM    PLATELET 159 08/25/2018 09:27 AM    PLATELET 197 08/24/2018 06:26 AM    MCV 81.8 08/26/2018 03:23 AM    MCV 82.2 08/25/2018 09:27 AM    MCV 81.3 08/24/2018 06:26 AM       Recent Labs     08/26/18  0323 08/25/18  0927 08/24/18  0626   WBC 3.5* 2.8* 4.7   RBC 4.22 4.22 4.33   HGB 11.8* 11.8* 12.1*   HCT 34.5* 34.7* 35.2*   PLT 166 159 197               Condition at discharge: Afebrile  Ambulating  Eating, Drinking, Voiding  Stable    Disposition:  Home    Home or Self Care    PCP:  Janell Quiet, MD

## 2018-08-26 NOTE — Progress Notes (Signed)
Problem: Pain  Goal: *Control of Pain  Outcome: Progressing Towards Goal

## 2018-08-26 NOTE — Progress Notes (Signed)
 Patient and mother wanted to get prescriptions filled here at outpatient pharmacy. Unable to fill her d/t both insurances not accepted at our outpatient pharmacy and copay would of been $3,000+. Discussed with case management and dr macario that patient can take his paper scripts to any private pharmacy, health dept or [facility] clinic to get them filled. Patient was unhappy with responses. Expressed displeasure with the lack of explanation of how to get his meds covered and asked to speak with patient advocate. Ellouise, Charge RN attempted to call patient advocate but was unsuccessful. Unable to leave message because the line just kept hanging up on her. Patient and mother were able to talk to private pharmacy about filling medications. Patient discharged home with mother.

## 2018-08-27 LAB — OVA AND PARASITE SCREEN

## 2018-08-27 LAB — OVA & PARASITES, STOOL

## 2018-09-11 LAB — CULTURE, FUNGUS & STAIN

## 2018-09-25 ENCOUNTER — Inpatient Hospital Stay
Admit: 2018-09-25 | Discharge: 2018-09-27 | Disposition: A | Discharge status: Home / Self Care | Payer: PRIVATE HEALTH INSURANCE | Attending: Hospitalist | Admitting: Hospitalist

## 2018-09-25 ENCOUNTER — Observation Stay: Admit: 2018-09-25 | Payer: PRIVATE HEALTH INSURANCE | Primary: Family Medicine

## 2018-09-25 LAB — CBC WITH AUTO DIFFERENTIAL
Anisocytosis: 1
CODOCYTES, CODOCYT: 1
ELLIPTOCYTES: 1
Eosinophils %: 6.5 % — ABNORMAL HIGH (ref 0–5)
Hematocrit: 24.2 % — ABNORMAL LOW (ref 37.0–50.0)
Hemoglobin: 7.9 gm/dl — ABNORMAL LOW (ref 12.4–17.2)
Hypochromasia: 1
Lymphocytes %: 4.4 % — ABNORMAL LOW (ref 28–48)
MACROCYTES, MACROCYT: 1
MCH: 28.6 pg (ref 23.0–34.6)
MCHC: 32.6 gm/dl (ref 30.0–36.0)
MCV: 87.7 fL (ref 80.0–98.0)
MPV: 12.8 fL — ABNORMAL HIGH (ref 6.0–10.0)
Metamyelocytes Relative: 2.2 % — ABNORMAL HIGH (ref 0–0)
Monocytes %: 13 % (ref 1–13)
Neutrophils %: 73.9 % — ABNORMAL HIGH (ref 34–64)
Nucleated RBCs: 0 (ref 0–0)
OVALOCYTES-DIF: 1
Platelet Comment: NORMAL
Platelets: 305 10*3/uL (ref 140–450)
Poikilocytosis: 1
RBC: 2.76 M/uL — ABNORMAL LOW (ref 3.80–5.70)
RDW-SD: 58.9 — ABNORMAL HIGH (ref 35.1–43.9)
SCHISTOCYTES: 1
WBC: 3.5 10*3/uL — ABNORMAL LOW (ref 4.0–11.0)

## 2018-09-25 LAB — POC URINE MACROSCOPIC
Bilirubin, Urine: NEGATIVE
Bilirubin: NEGATIVE
Blood, Urine: NEGATIVE
Blood: NEGATIVE
Glucose, Ur: NEGATIVE mg/dl
Glucose: NEGATIVE mg/dl
Ketone: NEGATIVE mg/dl
Ketones, Urine: NEGATIVE mg/dl
Leukocyte Esterase, Urine: NEGATIVE
Leukocyte Esterase: NEGATIVE
Nitrite, Urine: NEGATIVE
Nitrites: NEGATIVE
Protein, UA: NEGATIVE mg/dl
Protein: NEGATIVE mg/dl
Specific Gravity, UA: 1.015 (ref 1.005–1.030)
Specific gravity: 1.015 (ref 1.005–1.030)
Urobilinogen, UA, POCT: 1 EU/dl (ref 0.0–1.0)
Urobilinogen: 1 EU/dl (ref 0.0–1.0)
pH (UA): 7 (ref 5–9)
pH, UA: 7 (ref 5–9)

## 2018-09-25 LAB — CBC WITH AUTOMATED DIFF
Anisocytosis: 1
Codocytes: 1
EOSINOPHILS: 6.5 % — ABNORMAL HIGH (ref 0–5)
Elliptocytes: 1
HCT: 24.2 % — ABNORMAL LOW (ref 37.0–50.0)
HGB: 7.9 gm/dl — ABNORMAL LOW (ref 12.4–17.2)
HYPOCHROMASIA: 1
LYMPHOCYTES: 4.4 % — ABNORMAL LOW (ref 28–48)
MCH: 28.6 pg (ref 23.0–34.6)
MCHC: 32.6 gm/dl (ref 30.0–36.0)
MCV: 87.7 fL (ref 80.0–98.0)
METAMYELOCYTES: 2.2 % — ABNORMAL HIGH (ref 0–0)
MONOCYTES: 13 % (ref 1–13)
MPV: 12.8 fL — ABNORMAL HIGH (ref 6.0–10.0)
Macrocytes: 1
NEUTROPHILS: 73.9 % — ABNORMAL HIGH (ref 34–64)
NRBC: 0 (ref 0–0)
Ovalocytes-DIF: 1
PLATELET COMMENTS: NORMAL
PLATELET: 305 10*3/uL (ref 140–450)
Poikilocytosis: 1
RBC: 2.76 M/uL — ABNORMAL LOW (ref 3.80–5.70)
RDW-SD: 58.9 — ABNORMAL HIGH (ref 35.1–43.9)
Schistocytes: 1
WBC: 3.5 10*3/uL — ABNORMAL LOW (ref 4.0–11.0)

## 2018-09-25 MED ORDER — ACETAMINOPHEN 325 MG TABLET
325 mg | ORAL | Status: AC
Start: 2018-09-25 — End: 2018-09-25
  Administered 2018-09-25: 21:00:00 via ORAL

## 2018-09-25 MED FILL — TYLENOL 325 MG TABLET: 325 mg | ORAL | Qty: 3

## 2018-09-25 NOTE — ED Notes (Signed)
ED tech Mike called lab. Received labs. Running at this time.

## 2018-09-25 NOTE — ED Provider Notes (Signed)
[facility] [place]  Emergency Department Treatment Report    Patient: Travis [name] Age: 28 y.o. Sex: male    Date of Birth: 1991/01/07 Admit Date: 09/25/2018 PCP: Janell Quiet, MD   MRN: [id number]  CSN: [id number]     Room: ER20/ER20 Time Dictated: 12:12 PM      Dragon medical dictation software was used for portions of this report.  Unintended transcription errors may occur.    Chief Complaint   Tingling  History of Present Illness   28 y.o. male with history of HIV, currently on antiretroviral therapy.  He presents today for evaluation of tingling that is diffuse.  He states it is worse in his fingers and his toes.  He has no fever or chills, no nausea or vomiting, his diarrhea has actually improved with baseline diarrhea, he notes no weakness, no numbness, no lateralizing symptoms.  He called his doctor who advised him to come to the ER to "just get looked at".    Review of Systems   Review of Systems   Constitutional: Negative for chills, diaphoresis, fever and weight loss.   HENT: Negative for congestion and sore throat.    Respiratory: Negative for cough, sputum production, shortness of breath and wheezing.    Cardiovascular: Negative for chest pain, palpitations, orthopnea and leg swelling.   Gastrointestinal: Negative for abdominal pain, diarrhea, nausea and vomiting.   Genitourinary: Negative for dysuria, frequency and urgency.   Musculoskeletal: Negative for back pain and falls.   Skin: Negative for rash.   Neurological: Negative for dizziness and headaches.   Endo/Heme/Allergies: Does not bruise/bleed easily.   Psychiatric/Behavioral: Negative for substance abuse.       Past Medical/Surgical History     Past Medical History:   Diagnosis Date   ??? Asthma    ??? Hypertension    ??? Neurological disorder     Bell's palsy     History reviewed. No pertinent surgical history.    Social History     Social History     Socioeconomic History   ??? Marital status: SINGLE     Spouse name: Not on file    ??? Number of children: Not on file   ??? Years of education: Not on file   ??? Highest education level: Not on file   Tobacco Use   ??? Smoking status: Never Smoker   ??? Smokeless tobacco: Never Used   Substance and Sexual Activity   ??? Alcohol use: No   ??? Drug use: No   ??? Sexual activity: Yes     Partners: Female     Birth control/protection: Condom       Family History   History reviewed. No pertinent family history.    Current Medications     Current Outpatient Medications   Medication Sig Dispense Refill   ??? bictegrav-emtricit-tenofov ala (BIKTARVY) tab tablet Take 1 Tab by mouth daily. 30 Tab 1   ??? promethazine (PHENERGAN) 12.5 mg tablet Take 1 Tab by mouth every six (6) hours as needed for Nausea. 20 Tab 0   ??? albuterol (PROAIR HFA) 90 mcg/actuation inhaler Take 1 Puff by inhalation every four (4) hours as needed for Wheezing. 1 Inhaler 0   ??? trimethoprim-sulfamethoxazole (BACTRIM DS, SEPTRA DS) 160-800 mg per tablet Take 1 Tab by mouth every Monday, Wednesday, Friday. 30 Tab 1   ??? fluconazole (Diflucan) 200 mg tablet Take 1 Tab by mouth daily for 30 days. FDA advises cautious prescribing of oral fluconazole in pregnancy.  30 Tab 0       Allergies   No Known Allergies    Physical Exam     Visit Vitals  BP 127/80   Pulse 100   Temp 98.2 ??F (36.8 ??C)   Resp 18   Ht 5\' 9"  (1.753 m)   Wt 64.9 kg (143 lb)   SpO2 100%   BMI 21.12 kg/m??     Physical Exam  Constitutional:       General: He is not in acute distress.     Appearance: He is not diaphoretic.      Comments: He arrives in no distress drinking an iced tea   HENT:      Head: Normocephalic and atraumatic.      Right Ear: Tympanic membrane normal.      Left Ear: Tympanic membrane normal.      Mouth/Throat:      Mouth: Mucous membranes are moist.   Eyes:      Conjunctiva/sclera: Conjunctivae normal.      Pupils: Pupils are equal, round, and reactive to light.   Neck:      Musculoskeletal: Normal range of motion and neck supple.      Thyroid: No thyromegaly.       Trachea: No tracheal deviation.   Cardiovascular:      Rate and Rhythm: Normal rate and regular rhythm.      Heart sounds: Normal heart sounds. No murmur. No friction rub. No gallop.    Pulmonary:      Effort: Pulmonary effort is normal. No respiratory distress.      Breath sounds: Normal breath sounds. No stridor. No wheezing or rales.   Abdominal:      General: Bowel sounds are normal.      Palpations: Abdomen is soft. There is no mass.      Tenderness: There is no abdominal tenderness. There is no guarding or rebound.   Musculoskeletal: Normal range of motion.         General: No deformity.   Lymphadenopathy:      Cervical: No cervical adenopathy.   Skin:     General: Skin is warm and dry.      Findings: No rash.   Neurological:      General: No focal deficit present.      Mental Status: He is alert and oriented to person, place, and time.      Cranial Nerves: No cranial nerve deficit.      Sensory: No sensory deficit.      Motor: No weakness.      Coordination: Coordination normal.      Gait: Gait normal.      Deep Tendon Reflexes: Reflexes normal.   Psychiatric:         Mood and Affect: Mood and affect normal.         Impression and Management Plan   28 year old male here today with diffuse paresthesias but no concerning findings on examination, he is neurologically intact, he has no lateralizing symptoms.    Diagnostic Studies   Lab:   Recent Results (from the past 12 hour(s))   CBC WITH AUTOMATED DIFF    Collection Time: 09/25/18 12:45 PM   Result Value Ref Range    WBC 3.5 (L) 4.0 - 11.0 1000/mm3    NEUTROPHILS 73.9 (H) 34 - 64 %    LYMPHOCYTES 4.4 (L) 28 - 48 %    RBC 2.76 (L) 3.80 - 5.70 M/uL    MONOCYTES 13.0 1 -  13 %    HGB 7.9 (L) 12.4 - 17.2 gm/dl    EOSINOPHILS 6.5 (H) 0 - 5 %    HCT 24.2 (L) 37.0 - 50.0 %    MCV 87.7 80.0 - 98.0 fL    MCH 28.6 23.0 - 34.6 pg    METAMYELOCYTES 2.2 (H) 0 - 0 %    MCHC 32.6 30.0 - 36.0 gm/dl    PLATELET 161305 096140 - 045450 1000/mm3    MPV 12.8 (H) 6.0 - 10.0 fL     RDW-SD 58.9 (H) 35.1 - 43.9      Poikilocytosis 1      NRBC 0 0 - 0      Anisocytosis 1      HYPOCHROMASIA 1      Macrocytes 1      Codocytes 1      Schistocytes 1      Elliptocytes 1      Ovalocytes-DIF 1      PLATELET COMMENTS NORMAL      Smudge cells OCCASIONAL         Imaging:    No results found.        Medical Decision Making/ED Course     ED Course as of Sep 24 1500   Fri Sep 25, 2018   1424 Patient with fairly marketed anemia, hemoglobin has dropped roughly 4 g in 4 weeks.  Rectal examination performed with nursing at the bedside reveals healthy normal brown appearing stool that is Hemoccult negative.  Given his acute anemia, which is likely causing his symptoms, tachycardia, will type and cross and transfuse 1 unit packed red blood cells, given that he is having no active bleeding, no indication for acute hospitalization.  Once transfusion has completed, I do feel that he will be stable for discharge to home.    [JR]      ED Course User Index  [JR] Wynelle Bourgeoisomash, Smt. Loder A, MD       Patient signed out to Dr. Sherian MaroonSharkey with metabolic panel pending, with plan being if metabolic panel is reassuring will admit to observation for transfusion, should there be any other abnormalities may require frank hospitalization.    Final Diagnosis       ICD-10-CM ICD-9-CM   1. Anemia, unspecified type  D64.9 285.9   2. Paresthesia  R20.2 782.0     Disposition   Pending    Wynelle BourgeoisJonathan A Adja Ruff, MD  September 25, 2018    My signature above authenticates this document and my orders, the final    diagnosis (es), discharge prescription (s), and instructions in the Epic    record.  If you have any questions please contact [phone].

## 2018-09-25 NOTE — ED Notes (Signed)
1500: I assumed care of this patient from Dr. Romash.  Patient presented with twitching, pending CMP rule out electrolyte abnormality    Called lab, believe to have an abnormal sample, it was repeated    Patient was placed in ED observation status, will require further monitoring in the department to ensure safe disposition    Plan was for transfusion, patient consented to transfusion is having fatigue hemoglobin 7.9 down over 1 month from his baseline of 11.8    On examination well-appearing, guaiac reportedly negative, he denies blood in bowels or urine.  Recently started his antiretroviral therapy for what sounds like advanced age says his CD4 count is low seems like he is on Bactrim and has thrush on examination.    Laboratory required multiple repeat draws, ultimately his potassium was found to be 2.5 magnesium 1.0 and calcium was undetectable.  Ionized calcium was ordered first bolus of potassium and magnesium ordered intravenously    EKG per my read done at 2218 hrs. sinus tachycardia heart rate 109 intervals within normal limits except for prolonged QTC of 527 T wave inversions in the lateral leads nonspecific    Monitor normal sinus rhythm    Patient's presentation, history, physical exam and laboratory evaluations were reviewed.  I felt the patient would benefit from inpatient management and treatment.    Consult:  Discussed care with Dr. John Paul H Matriano standard discussion; including history of patient??s chief complaint, available diagnostic results, and treatment course.  Patient was accepted to their service.        Disposition:    Admitted to medicine service      Portions of this chart were created with Dragon medical speech to text program.   Unrecognized errors may be present.

## 2018-09-25 NOTE — Progress Notes (Signed)
Code R Consult Received.    After Hours Consult:     No    Last admission dc date and dx       08/26/2018   intractable n/v    What was the discharge plan     home    Now presents     Tingling all over     Case Discussed with ED Physician:   N    Case Discussed with Attending Physician:    N    Appropriate for Admission:      Yes Observation     Anticipated Discharge Needs:    home    Disposition if discharged from the ED         Readmission interview done (epic)     N    Readmission assessment completed (compare AD)   Y    [employer] complete Y

## 2018-09-25 NOTE — ED Notes (Signed)
Pt was upset because mother cannot swap with a male visitor per security and CN ,NM went to talk to pt , mother was allowed to visit pt in room

## 2018-09-25 NOTE — ED Triage Notes (Signed)
Pt arrived to ER from home with c/o "tingling all over my body for 2-3 days". Pt states HIV is his only medical hx. Pt denies any recent etoh or illegal drug use.

## 2018-09-25 NOTE — Progress Notes (Signed)
Prelim code r note     Last admission date 6/24-7/1    Reason for last admission intractable n/v, met acidosis    MD holladay     Disposition at discharge [place] [facility] [place]

## 2018-09-25 NOTE — ED Notes (Signed)
ED tech Kathlene November called lab. Received labs. Running at this time.

## 2018-09-25 NOTE — ED Notes (Signed)
Pt was upset because mother cannot swap with a male visitor per security and CN ,NM went to talk to pt , mother was allowed to visit pt in room

## 2018-09-25 NOTE — Progress Notes (Signed)
Prelim code r note     Last admission date 6/24-7/1    Reason for last admission intractable n/v, met acidosis    MD holladay     Disposition at discharge Home     [place]

## 2018-09-25 NOTE — ED Notes (Signed)
1500: I assumed care of this patient from Dr. Jacklynn Barnacle.  Patient presented with twitching, pending CMP rule out electrolyte abnormality    Called lab, believe to have an abnormal sample, it was repeated    Patient was placed in ED observation status, will require further monitoring in the department to ensure safe disposition    Plan was for transfusion, patient consented to transfusion is having fatigue hemoglobin 7.9 down over 1 month from his baseline of 11.8    On examination well-appearing, guaiac reportedly negative, he denies blood in bowels or urine.  Recently started his antiretroviral therapy for what sounds like advanced age says his CD4 count is low seems like he is on Bactrim and has thrush on examination.    Laboratory required multiple repeat draws, ultimately his potassium was found to be 2.5 magnesium 1.0 and calcium was undetectable.  Ionized calcium was ordered first bolus of potassium and magnesium ordered intravenously    EKG per my read done at 2218 hrs. sinus tachycardia heart rate 109 intervals within normal limits except for prolonged QTC of 527 T wave inversions in the lateral leads nonspecific    Monitor normal sinus rhythm    Patient's presentation, history, physical exam and laboratory evaluations were reviewed.  I felt the patient would benefit from inpatient management and treatment.    Consult:  Discussed care with Dr. Illene Regulus standard discussion; including history of patient??s chief complaint, available diagnostic results, and treatment course.  Patient was accepted to their service.        Disposition:    Admitted to medicine service      Portions of this chart were created with Dragon medical speech to text program.   Unrecognized errors may be present.

## 2018-09-25 NOTE — ED Notes (Signed)
 Pt arrived to ER from home with c/o tingling all over my body for 2-3 days. Pt states HIV is his only medical hx. Pt denies any recent etoh or illegal drug use.

## 2018-09-25 NOTE — Progress Notes (Signed)
Code R Consult Received.    After Hours Consult:     No    Last admission dc date and dx       08/26/2018   intractable n/v    What was the discharge plan     home    Now presents     Tingling all over     Case Discussed with ED Physician:   N    Case Discussed with Attending Physician:    N    Appropriate for Admission:      Yes Observation     Anticipated Discharge Needs:    home    Disposition if discharged from the ED         Readmission interview done (epic)     N    Readmission assessment completed (compare AD)   Mikael Spray complete Y

## 2018-09-25 NOTE — ED Provider Notes (Signed)
[facility]  Emergency [facility] Treatment Report    Patient: Travis [name] Age: 28 y.o. Sex: male    Date of Birth: 08/29/1990 Admit Date: 09/25/2018 PCP: Paulla ForeKapoor, [name], MD   MRN: [id number]  CSN: [id number]     Room: ER20/ER20 Time Dictated: 12:12 PM      Dragon medical dictation software was used for portions of this report.  Unintended transcription errors may occur.    Chief Complaint   Tingling  History of Present Illness   28 y.o. male with history of HIV, currently on antiretroviral therapy.  He presents today for evaluation of tingling that is diffuse.  He states it is worse in his fingers and his toes.  He has no fever or chills, no nausea or vomiting, his diarrhea has actually improved with baseline diarrhea, he notes no weakness, no numbness, no lateralizing symptoms.  He called his doctor who advised him to come to the ER to "just get looked at".    Review of Systems   Review of Systems   Constitutional: Negative for chills, diaphoresis, fever and weight loss.   HENT: Negative for congestion and sore throat.    Respiratory: Negative for cough, sputum production, shortness of breath and wheezing.    Cardiovascular: Negative for chest pain, palpitations, orthopnea and leg swelling.   Gastrointestinal: Negative for abdominal pain, diarrhea, nausea and vomiting.   Genitourinary: Negative for dysuria, frequency and urgency.   Musculoskeletal: Negative for back pain and falls.   Skin: Negative for rash.   Neurological: Negative for dizziness and headaches.   Endo/Heme/Allergies: Does not bruise/bleed easily.   Psychiatric/Behavioral: Negative for substance abuse.       Past Medical/Surgical History     Past Medical History:   Diagnosis Date   ??? Asthma    ??? Hypertension    ??? Neurological disorder     Bell's palsy     History reviewed. No pertinent surgical history.    Social History     Social History     Socioeconomic History   ??? Marital status: SINGLE     Spouse name: Not on file   ??? Number  of children: Not on file   ??? Years of education: Not on file   ??? Highest education level: Not on file   Tobacco Use   ??? Smoking status: Never Smoker   ??? Smokeless tobacco: Never Used   Substance and Sexual Activity   ??? Alcohol use: No   ??? Drug use: No   ??? Sexual activity: Yes     Partners: Female     Birth control/protection: Condom       Family History   History reviewed. No pertinent family history.    Current Medications     Current Outpatient Medications   Medication Sig Dispense Refill   ??? bictegrav-emtricit-tenofov ala (BIKTARVY) tab tablet Take 1 Tab by mouth daily. 30 Tab 1   ??? promethazine (PHENERGAN) 12.5 mg tablet Take 1 Tab by mouth every six (6) hours as needed for Nausea. 20 Tab 0   ??? albuterol (PROAIR HFA) 90 mcg/actuation inhaler Take 1 Puff by inhalation every four (4) hours as needed for Wheezing. 1 Inhaler 0   ??? trimethoprim-sulfamethoxazole (BACTRIM DS, SEPTRA DS) 160-800 mg per tablet Take 1 Tab by mouth every Monday, Wednesday, Friday. 30 Tab 1   ??? fluconazole (Diflucan) 200 mg tablet Take 1 Tab by mouth daily for 30 days. FDA advises cautious prescribing of oral fluconazole in pregnancy.  30 Tab 0       Allergies   No Known Allergies    Physical Exam     Visit Vitals  BP 127/80   Pulse 100   Temp 98.2 ??F (36.8 ??C)   Resp 18   Ht 5\' 9"  (1.753 m)   Wt 64.9 kg (143 lb)   SpO2 100%   BMI 21.12 kg/m??     Physical Exam  Constitutional:       General: He is not in acute distress.     Appearance: He is not diaphoretic.      Comments: He arrives in no distress drinking an iced tea   HENT:      Head: Normocephalic and atraumatic.      Right Ear: Tympanic membrane normal.      Left Ear: Tympanic membrane normal.      Mouth/Throat:      Mouth: Mucous membranes are moist.   Eyes:      Conjunctiva/sclera: Conjunctivae normal.      Pupils: Pupils are equal, round, and reactive to light.   Neck:      Musculoskeletal: Normal range of motion and neck supple.      Thyroid: No thyromegaly.      Trachea: No  tracheal deviation.   Cardiovascular:      Rate and Rhythm: Normal rate and regular rhythm.      Heart sounds: Normal heart sounds. No murmur. No friction rub. No gallop.    Pulmonary:      Effort: Pulmonary effort is normal. No respiratory distress.      Breath sounds: Normal breath sounds. No stridor. No wheezing or rales.   Abdominal:      General: Bowel sounds are normal.      Palpations: Abdomen is soft. There is no mass.      Tenderness: There is no abdominal tenderness. There is no guarding or rebound.   Musculoskeletal: Normal range of motion.         General: No deformity.   Lymphadenopathy:      Cervical: No cervical adenopathy.   Skin:     General: Skin is warm and dry.      Findings: No rash.   Neurological:      General: No focal deficit present.      Mental Status: He is alert and oriented to person, place, and time.      Cranial Nerves: No cranial nerve deficit.      Sensory: No sensory deficit.      Motor: No weakness.      Coordination: Coordination normal.      Gait: Gait normal.      Deep Tendon Reflexes: Reflexes normal.   Psychiatric:         Mood and Affect: Mood and affect normal.         Impression and Management Plan   28 year old male here today with diffuse paresthesias but no concerning findings on examination, he is neurologically intact, he has no lateralizing symptoms.    Diagnostic Studies   Lab:   Recent Results (from the past 12 hour(s))   CBC WITH AUTOMATED DIFF    Collection Time: 09/25/18 12:45 PM   Result Value Ref Range    WBC 3.5 (L) 4.0 - 11.0 1000/mm3    NEUTROPHILS 73.9 (H) 34 - 64 %    LYMPHOCYTES 4.4 (L) 28 - 48 %    RBC 2.76 (L) 3.80 - 5.70 M/uL    MONOCYTES 13.0 1 -  13 %    HGB 7.9 (L) 12.4 - 17.2 gm/dl    EOSINOPHILS 6.5 (H) 0 - 5 %    HCT 24.2 (L) 37.0 - 50.0 %    MCV 87.7 80.0 - 98.0 fL    MCH 28.6 23.0 - 34.6 pg    METAMYELOCYTES 2.2 (H) 0 - 0 %    MCHC 32.6 30.0 - 36.0 gm/dl    PLATELET 305 140 - 450 1000/mm3    MPV 12.8 (H) 6.0 - 10.0 fL    RDW-SD 58.9 (H) 35.1 -  43.9      Poikilocytosis 1      NRBC 0 0 - 0      Anisocytosis 1      HYPOCHROMASIA 1      Macrocytes 1      Codocytes 1      Schistocytes 1      Elliptocytes 1      Ovalocytes-DIF 1      PLATELET COMMENTS NORMAL      Smudge cells OCCASIONAL         Imaging:    No results found.        Medical Decision Making/ED Course     ED Course as of Sep 24 1500   Fri Sep 25, 2018   1424 Patient with fairly marketed anemia, hemoglobin has dropped roughly 4 g in 4 weeks.  Rectal examination performed with nursing at the bedside reveals healthy normal brown appearing stool that is Hemoccult negative.  Given his acute anemia, which is likely causing his symptoms, tachycardia, will type and cross and transfuse 1 unit packed red blood cells, given that he is having no active bleeding, no indication for acute hospitalization.  Once transfusion has completed, I do feel that he will be stable for discharge to home.    [JR]      ED Course User Index  [JR] Mora Bellman, MD       Patient signed out to Dr. Veneta Penton with metabolic panel pending, with plan being if metabolic panel is reassuring will admit to observation for transfusion, should there be any other abnormalities may require frank hospitalization.    Final Diagnosis       ICD-10-CM ICD-9-CM   1. Anemia, unspecified type  D64.9 285.9   2. Paresthesia  R20.2 782.0     Disposition   Pending    Mora Bellman, MD  September 25, 2018    My signature above authenticates this document and my orders, the final    diagnosis (es), discharge prescription (s), and instructions in the Epic    record.  If you have any questions please contact [phone].

## 2018-09-26 LAB — TROPONIN: Troponin I: <0.015 ng/ml (ref 0.000–0.045)

## 2018-09-26 LAB — COMPREHENSIVE METABOLIC PANEL
ALT: 73 U/L (ref 12–78)
AST: 74 U/L — ABNORMAL HIGH (ref 15–37)
Albumin: 1.6 gm/dl — ABNORMAL LOW (ref 3.4–5.0)
Alkaline Phosphatase: 221 U/L — ABNORMAL HIGH (ref 45–117)
Anion Gap: 7 mmol/L (ref 5–15)
BUN: 8 mg/dl (ref 7–25)
CO2: 26 mEq/L (ref 21–32)
Calcium: <5 mg/dl — CL (ref 8.5–10.1)
Chloride: 106 mEq/L (ref 98–107)
Creatinine: 1 mg/dl (ref 0.6–1.3)
EGFR IF NonAfrican American: >60
GFR African American: >60
Glucose: 77 mg/dl (ref 74–106)
Potassium: 2.5 mEq/L — CL (ref 3.5–5.1)
Sodium: 138 mEq/L (ref 136–145)
Total Bilirubin: 1.1 mg/dl — ABNORMAL HIGH (ref 0.2–1.0)
Total Protein: 6.1 gm/dl — ABNORMAL LOW (ref 6.4–8.2)

## 2018-09-26 LAB — TYPE AND CROSSMATCH
BLOOD TYPE CODE: 6200
ISSUE DATE/TIME: 202008010028
SPECIMEN EXPIRATION DATE: 202008032359
STATUS INFO.: TRANSFUSED

## 2018-09-26 LAB — ANTIBODY SCREEN
Antibody Screen: NEGATIVE
Antibody screen: NEGATIVE

## 2018-09-26 LAB — POCT GLUCOSE
POC Glucose: 89 mg/dL (ref 65–105)
POC Glucose: 106 mg/dL — ABNORMAL HIGH (ref 65–105)

## 2018-09-26 LAB — CALCIUM, IONIZED
CALCIUM,IONIZED: 2.8 mg/dl — CL (ref 4.4–5.4)
CALCIUM,IONIZED: 2.8 mg/dl — CL (ref 4.4–5.4)

## 2018-09-26 LAB — MAGNESIUM
Magnesium: 1 mg/dl — CL (ref 1.6–2.6)
Magnesium: 1 mg/dl — CL (ref 1.6–2.6)

## 2018-09-26 LAB — BLOOD TYPE, (ABO+RH)
ABO/Rh(D): A POS
ABO/Rh: A POS

## 2018-09-26 LAB — METABOLIC PANEL, COMPREHENSIVE
ALT (SGPT): 73 U/L (ref 12–78)
AST (SGOT): 74 U/L — ABNORMAL HIGH (ref 15–37)
Albumin: 1.6 gm/dl — ABNORMAL LOW (ref 3.4–5.0)
Alk. phosphatase: 221 U/L — ABNORMAL HIGH (ref 45–117)
Anion gap: 7 mmol/L (ref 5–15)
BUN: 8 mg/dl (ref 7–25)
Bilirubin, total: 1.1 mg/dl — ABNORMAL HIGH (ref 0.2–1.0)
CO2: 26 mEq/L (ref 21–32)
Calcium: <5 mg/dl — CL (ref 8.5–10.1)
Chloride: 106 mEq/L (ref 98–107)
Creatinine: 1 mg/dl (ref 0.6–1.3)
GFR est AA: >60
GFR est non-AA: >60
Glucose: 77 mg/dl (ref 74–106)
Potassium: 2.5 mEq/L — CL (ref 3.5–5.1)
Protein, total: 6.1 gm/dl — ABNORMAL LOW (ref 6.4–8.2)
Sodium: 138 mEq/L (ref 136–145)

## 2018-09-26 LAB — TYPE + CROSSMATCH
Blood type code: 6200
Issue date/time: 202008010028
Specimen expiration date: 202008032359
Status info.: TRANSFUSED

## 2018-09-26 LAB — GLUCOSE, POC
Glucose (POC): 89 mg/dL (ref 65–105)
Glucose (POC): 106 mg/dL — ABNORMAL HIGH (ref 65–105)

## 2018-09-26 LAB — TROPONIN I: Troponin-I: <0.015 ng/ml (ref 0.000–0.045)

## 2018-09-26 MED ORDER — CALCIUM CARBONATE 200 MG (500 MG) CHEWABLE TAB
200 mg calcium (500 mg) | Freq: Three times a day (TID) | ORAL | Status: DC
Start: 2018-09-26 — End: 2018-09-27
  Administered 2018-09-26 – 2018-09-27 (×4): via ORAL

## 2018-09-26 MED ORDER — CALCIUM GLUCONATE 2 GRAM/100 ML IN SODIUM CHLORIDE (ISO-OSM) IVPB
2 gram/100 mL | Freq: Once | INTRAVENOUS | Status: AC
Start: 2018-09-26 — End: 2018-09-26
  Administered 2018-09-26: 18:00:00 via INTRAVENOUS

## 2018-09-26 MED ORDER — POTASSIUM CHLORIDE SR 20 MEQ TAB, PARTICLES/CRYSTALS
20 mEq | Freq: Three times a day (TID) | ORAL | Status: DC
Start: 2018-09-26 — End: 2018-09-27
  Administered 2018-09-26 – 2018-09-27 (×5): via ORAL

## 2018-09-26 MED ORDER — ACETAMINOPHEN 650 MG RECTAL SUPPOSITORY
650 mg | RECTAL | Status: DC | PRN
Start: 2018-09-26 — End: 2018-09-27

## 2018-09-26 MED ORDER — MAGNESIUM SULFATE 2 GRAM/50 ML IVPB
2 gram/50 mL (4 %) | Freq: Once | INTRAVENOUS | Status: AC
Start: 2018-09-26 — End: 2018-09-26
  Administered 2018-09-26: 16:00:00 via INTRAVENOUS

## 2018-09-26 MED ORDER — MAGNESIUM SULFATE 2 GRAM/50 ML IVPB
2 gram/50 mL (4 %) | INTRAVENOUS | Status: AC
Start: 2018-09-26 — End: 2018-09-26
  Administered 2018-09-26: 03:00:00 via INTRAVENOUS

## 2018-09-26 MED ORDER — NALOXONE 0.4 MG/ML INJECTION
0.4 mg/mL | INTRAMUSCULAR | Status: DC | PRN
Start: 2018-09-26 — End: 2018-09-27

## 2018-09-26 MED ORDER — LOPERAMIDE 1 MG/5 ML ORAL LIQUID
1 mg/5 mL | Freq: Four times a day (QID) | ORAL | Status: DC | PRN
Start: 2018-09-26 — End: 2018-09-26

## 2018-09-26 MED ORDER — SODIUM CHLORIDE 0.9% BOLUS IV
0.9 % | INTRAVENOUS | Status: AC
Start: 2018-09-26 — End: 2018-09-26
  Administered 2018-09-26: 03:00:00 via INTRAVENOUS

## 2018-09-26 MED ORDER — FLUCONAZOLE 100 MG TAB
100 mg | Freq: Every day | ORAL | Status: DC
Start: 2018-09-26 — End: 2018-09-27
  Administered 2018-09-26 – 2018-09-27 (×2): via ORAL

## 2018-09-26 MED ORDER — BICTEGRAVIR 50 MG-EMTRICITABINE 200 MG-TENOFOVIR ALAFENAM 25 MG TABLET
50-200-25 mg | Freq: Every day | ORAL | Status: DC
Start: 2018-09-26 — End: 2018-09-26

## 2018-09-26 MED ORDER — POTASSIUM CHLORIDE 10 MEQ/100 ML IV PIGGY BACK
10 mEq/0 mL | INTRAVENOUS | Status: AC
Start: 2018-09-26 — End: 2018-09-26
  Administered 2018-09-26: 05:00:00 via INTRAVENOUS

## 2018-09-26 MED ORDER — ENOXAPARIN 40 MG/0.4 ML SUB-Q SYRINGE
40 mg/0.4 mL | SUBCUTANEOUS | Status: DC
Start: 2018-09-26 — End: 2018-09-27
  Administered 2018-09-26 – 2018-09-27 (×2): via SUBCUTANEOUS

## 2018-09-26 MED ORDER — LOPERAMIDE 2 MG CAP
2 mg | Freq: Four times a day (QID) | ORAL | Status: DC | PRN
Start: 2018-09-26 — End: 2018-09-27
  Administered 2018-09-26 – 2018-09-27 (×2): via ORAL

## 2018-09-26 MED ORDER — ACETAMINOPHEN (TYLENOL) SOLUTION 32MG/ML
ORAL | Status: DC | PRN
Start: 2018-09-26 — End: 2018-09-27

## 2018-09-26 MED ORDER — PROMETHAZINE 25 MG TAB
25 mg | Freq: Four times a day (QID) | ORAL | Status: DC | PRN
Start: 2018-09-26 — End: 2018-09-27

## 2018-09-26 MED ORDER — PNEUMOCOCCAL 13-VAL CONJ VACCINE-DIP CRM (PF) 0.5 ML IM SYRINGE
0.5 mL | INTRAMUSCULAR | Status: DC
Start: 2018-09-26 — End: 2018-09-27

## 2018-09-26 MED ORDER — BICTEGRAVIR 50 MG-EMTRICITABINE 200 MG-TENOFOVIR ALAFENAM 25 MG TABLET
50-200-25 mg | Freq: Every day | ORAL | Status: DC
Start: 2018-09-26 — End: 2018-09-27

## 2018-09-26 MED ORDER — ACETAMINOPHEN 325 MG TABLET
325 mg | ORAL | Status: DC | PRN
Start: 2018-09-26 — End: 2018-09-27

## 2018-09-26 MED ORDER — TRIMETHOPRIM-SULFAMETHOXAZOLE 160 MG-800 MG TAB
160-800 mg | ORAL | Status: DC
Start: 2018-09-26 — End: 2018-09-27

## 2018-09-26 MED ORDER — ALBUTEROL SULFATE 0.083 % (0.83 MG/ML) SOLN FOR INHALATION
2.5 mg /3 mL (0.083 %) | RESPIRATORY_TRACT | Status: DC | PRN
Start: 2018-09-26 — End: 2018-09-27

## 2018-09-26 MED FILL — CALCIUM CARBONATE 200 MG (500 MG) CHEWABLE TAB: 200 mg calcium (500 mg) | ORAL | Qty: 1

## 2018-09-26 MED FILL — CALCIUM GLUCONATE 2 GRAM/100 ML IN SODIUM CHLORIDE (ISO-OSM) IVPB: 2 gram/100 mL | INTRAVENOUS | Qty: 100

## 2018-09-26 MED FILL — LOPERAMIDE 2 MG CAP: 2 mg | ORAL | Qty: 1

## 2018-09-26 MED FILL — BIKTARVY 50 MG-200 MG-25 MG TABLET: 50-200-25 mg | ORAL | Qty: 1

## 2018-09-26 MED FILL — POTASSIUM CHLORIDE SR 20 MEQ TAB, PARTICLES/CRYSTALS: 20 mEq | ORAL | Qty: 2

## 2018-09-26 MED FILL — MAGNESIUM SULFATE 2 GRAM/50 ML IVPB: 2 gram/50 mL (4 %) | INTRAVENOUS | Qty: 50

## 2018-09-26 MED FILL — FLUCONAZOLE 100 MG TAB: 100 mg | ORAL | Qty: 2

## 2018-09-26 MED FILL — LOPERAMIDE 1 MG/5 ML ORAL LIQUID: 1 mg/5 mL | ORAL | Qty: 10

## 2018-09-26 MED FILL — POTASSIUM CHLORIDE 10 MEQ/100 ML IV PIGGY BACK: 10 mEq/0 mL | INTRAVENOUS | Qty: 100

## 2018-09-26 MED FILL — ENOXAPARIN 40 MG/0.4 ML SUB-Q SYRINGE: 40 mg/0.4 mL | SUBCUTANEOUS | Qty: 0.4

## 2018-09-26 NOTE — Progress Notes (Signed)
Problem: Risk for Spread of Infection  Goal: Prevent transmission of infectious organism to others  Description: Prevent the transmission of infectious organisms to other patients, staff members, and visitors.  Outcome: Progressing Towards Goal

## 2018-09-26 NOTE — Progress Notes (Addendum)
1045 - paged dr. Huang    1048 - I informed MD that the patient's mother is requesting a phone call from her. I also informed her that the patient is requesting immodium and that he just had a loose stool with a small amount of blood. I also asked if she could order the patient's HIV meds (byktarvy); MD stated that it was in the sign and held orders.

## 2018-09-26 NOTE — ED Notes (Signed)
2 warm blankets given

## 2018-09-26 NOTE — Other (Signed)
TRANSFER - OUT REPORT:    Verbal report given to 5223(name) on Travis [name]  being transferred to 5223(unit) for routine progression of care       Report consisted of patient???s Situation, Background, Assessment and   Recommendations(SBAR).     Information from the following report(s) SBAR, ED Summary, Procedure Summary, Intake/Output, MAR and Recent Results was reviewed with the receiving nurse.    Lines:   Peripheral [name] 09/25/18 Right Antecubital (Active)   Site Assessment Clean, dry, & intact 09/25/18 1241   Phlebitis Assessment 0 09/25/18 1241   Infiltration Assessment 0 09/25/18 1241   Dressing Status Clean, dry, & intact 09/25/18 1241   Dressing Type Transparent 09/25/18 1241   Hub Color/Line Status Pink;Patent 09/25/18 1241   Action Taken Blood drawn 09/25/18 1241       Peripheral [name] 09/25/18 Left Antecubital (Active)        Opportunity for questions and clarification was provided.      Patient transported with:   [employer]

## 2018-09-26 NOTE — H&P (Signed)
Admission History and Physical    Travis [name] is a 28 y.o. male who is being admitted.    Impression and Plan:     1 severe hypocalcemia  2 hypokalemia  3 hypomagnesia  4 HIV  5 hypertension  6 asthma    Plan     We will give [name] calcium gluconate  Replace with [name] magnesium  Give potassium p.o.  Recheck labs in a.m., ionized calcium, magnesium, potassium.  Continue home HIV medication  DVT prophylaxis with Lovenox  Advanced care plan: Patient full code    Continue home regimen for otherwise chronic, stable medical conditions as noted above. Discussed with patient regarding management, prognosis, treatment and complications of the patient's medical conditions discussed in detail. All questions answered to the satisfaction of those individual(s) who also verbalized understanding of and agreement with the assessment and plan.     Date of anticipated discharge: Likely tomorrow    Chief Complaint   Patient presents with   ??? Tingling       HPI:  Travis [name] is a 28 y.o. male with PMH sig for HIV, currently on antiretroviral therapy.  He presents today for evaluation of tingling that is diffuse.  He states it is worse in his fingers and his toes, he notes no weakness, no numbness, no lateralizing symptoms.  He called his doctor who advised him to come to the ER.  He denies any fever or chills, nausea vomiting, abdominal pain, diarrhea constipation.      Past Medical History:   Diagnosis Date   ??? Asthma    ??? Hypertension    ??? Neurological disorder     Bell's palsy     History reviewed. No pertinent surgical history.  Social History     Socioeconomic History   ??? Marital status: SINGLE     Spouse name: Not on file   ??? Number of children: Not on file   ??? Years of education: Not on file   ??? Highest education level: Not on file   Occupational History   ??? Not on file   Social Needs   ??? Financial resource strain: Not on file   ??? Food insecurity     Worry: Not on file     Inability: Not on file   ??? Transportation needs      Medical: Not on file     Non-medical: Not on file   Tobacco Use   ??? Smoking status: Never Smoker   ??? Smokeless tobacco: Never Used   Substance and Sexual Activity   ??? Alcohol use: No   ??? Drug use: No   ??? Sexual activity: Yes     Partners: Female     Birth control/protection: Condom   Lifestyle   ??? Physical activity     Days per week: Not on file     Minutes per session: Not on file   ??? Stress: Not on file   Relationships   ??? Social Wellsite geologistconnections     Talks on phone: Not on file     Gets together: Not on file     Attends religious service: Not on file     Active member of club or organization: Not on file     Attends meetings of clubs or organizations: Not on file     Relationship status: Not on file   ??? Intimate partner violence     Fear of current or ex partner: Not on file  Emotionally abused: Not on file     Physically abused: Not on file     Forced sexual activity: Not on file   Other Topics Concern   ??? Not on file   Social History Narrative   ??? Not on file     family history: Significant for hypertension  No Known Allergies    Home Medications:     Prior to Admission Medications   Prescriptions Last Dose Informant Patient Reported? Taking?   albuterol (PROAIR HFA) 90 mcg/actuation inhaler 08/25/2018 at Unknown time  No Yes   Sig: Take 1 Puff by inhalation every four (4) hours as needed for Wheezing.   bictegrav-emtricit-tenofov ala (BIKTARVY) tab tablet 09/19/2018 at Unknown time  No Yes   Sig: Take 1 Tab by mouth daily.   fluconazole (Diflucan) 200 mg tablet Not Taking at Unknown time  No No   Sig: Take 1 Tab by mouth daily for 30 days. FDA advises cautious prescribing of oral fluconazole in pregnancy.   promethazine (PHENERGAN) 12.5 mg tablet 09/24/2018 at Unknown time  No Yes   Sig: Take 1 Tab by mouth every six (6) hours as needed for Nausea.   trimethoprim-sulfamethoxazole (BACTRIM DS, SEPTRA DS) 160-800 mg per tablet Not Taking at Unknown time  No No    Sig: Take 1 Tab by mouth every Monday, Wednesday, Friday.      Facility-Administered Medications: None     Review of Systems:   Constitutional: Negative for fever, chills, diaphoresis and unexpected weight change.   HENT: Negative for ear pain, congestion, sore throat, rhinorrhea, drooling, trouble swallowing, neck pain and tinnitus.   Eyes: Negative for photophobia, pain, redness and visual disturbance.   Respiratory: negative for shortness of breath, cough, chest tightness, wheezing or stridor.   Cardiovascular: Negative for chest pain, palpitations and leg swelling.   Gastrointestinal: Negative for nausea, vomiting, abdominal pain, diarrhea, constipation, blood in stool, abdominal distention and anal bleeding.   Genitourinary: Negative for dysuria, urgency, frequency, hematuria, flank pain and difficulty urinating.   Musculoskeletal: Negative for back pain and arthralgias.   Skin: Negative for color change, rash and wound.   Neurological: Negative for dizziness, seizures, syncope, speech difficulty, light-headedness or headaches.  Positive numbness and tingling  Hematological: Does not bruise/bleed easily.   Psychiatric/Behavioral: Negative for suicidal ideas, hallucinations, behavioral problems, self-injury or agitation     Physical Assessment:     Visit Vitals  BP 129/81 (BP 1 Location: Right arm, BP Patient Position: Supine)   Pulse (!) 101   Temp 99 ??F (37.2 ??C)   Resp 18   Ht 5\' 9"  (1.753 m)   Wt 64.9 kg (143 lb)   SpO2 100%   BMI 21.12 kg/m??       GENERAL: in moderate distress.   HEENT: Normocephalic and atraumatic head. Oral mucosa moist. No thrush.   Eyes: Pupils are equal, reactive to light and accommodation. Normal extra ocular movements. No icterus.   NECK: Supple. Trachea midline.   RESPIRATORY: No use of accessory muscles of respiration. Bilateral BS present, decreased at bases. No rales or rhonchi.   CARDIOVASCULAR: S1 and S2 present, regular. No murmur, rub, or thrill.  Capillary refill delayed. Peripheral pulses including radial pulse, dorsalis pedis symmetrical, weak.   ABDOMEN: Soft and nontender with positive bowel sounds. No organomegaly.   EXTREMITIES: No edema. No calf tenderness.   NEUROLOGICAL: Cranial nerves II through XII grossly intact. No focal weakness.   SKIN: No rash. Skin is warm, dry, and  intact.   MUSCULOSKELETAL: No joint effusions or joint tenderness.   LYMPH NODES: No axillary, cervical, or supraclavicular lymphadenopathy.   PSYCHIATRIC: Normal mood and affect     Dragon medical dictation software was used for portions of this report. Unintended errors may occur.     Barkley Boards, D.O.  [facility]

## 2018-09-26 NOTE — Progress Notes (Addendum)
Patient admitted on 09/25/2018 from home with   Chief Complaint   Patient presents with   ??? Tingling          The patient is being treated for    PMH:   Past Medical History:   Diagnosis Date   ??? Asthma    ??? Hypertension    ??? Neurological disorder     Bell's palsy        Treatment Team: Treatment Team: Attending Provider: Barkley Boards, DO; Hospitalist: Barkley Boards, DO      The patient has been admitted to the hospital 3 times in the past 12 months.    Previous 4 Admission Dates Admission and Discharge Diagnosis Interventions Barriers Disposition   08/18/18 Intractable N&V ID NONE HOME   08/06/18 AKI/Dehydration MED NONE HOME   07/21/18 AKI MED NONE HOME   04/10/18 Rash MED NONE HOME     Patient and Family/Caregivers Goals of Care: home with family assistance    Caregivers Participating in Plan of Care/Discharge Plan with the patient: mother    Tentative dc plan: home with family assistance    Anticipated DME needs for discharge: none    PRESCREENING COMPLETED FOR SNF n/a*      Does the patient have appropriate clothing available to be worn at discharge? yes      Anticipated Discharge Date: 09/30/18    The plan of care and discharge plan has been discussed with Barkley Boards, DO and all other appropriate providers and adjusted per interdisciplinary team recommendations and in discussion with the patient and the patient designated Care Plan Participants.    Barriers to Healthcare Success/ Readmission Risk Factors: none    Consults:  Palliative Care Consult Recommended: n/a  Transitional Care Clinic Referral: n/a  Transitional Nurse Navigator Referral: n/a  Oncology Navigator Referral: n/a  SW consulted: n/a  [facility] ([facility]) Consulted: n/a  Outside Hospital/Community Resources Referrals and Collaboration: n/a    Food/Nutrition Needs:   n/a                   Dietician Consulted: n/a    RRAT Score: Low Risk            8       Total Score        4 IP Visits Last 12 Months (1-3=4, 4=9, >4=11)     4 Pt. Coverage (Medicare=5 , Medicaid, or Self-Pay=4)        Criteria that do not apply:    Has Seen PCP in Last 6 Months (Yes=3, No=0)    Married. Living with Significant Other. Assisted Living. LTAC. SNF. or   Rehab    Patient Length of Stay (>5 days = 3)    Charlson Comorbidity Score (Age + Comorbid Conditions)           PCP: Janell Quiet, MD . How do you get to your doctor appointments?    Specialists: none    Dialysis Unit: n/a    [facility] [place] or [facility] direct [facility]. Are there any medications that you have trouble paying for? Any difficulty getting your medications?    DME available at [place]:  n/a            Home O2 Provider: n/a    Home Environment and Prior Level of Function: Lives at [place] [address] '@HOMEPHONE'$ @. Lives with mother in a multistory with a flight of  steps into home.  Responsibilities at home include : independent    Prior to admission open services: none    [place] n/a    Extended Emergency Contact Information  Primary Emergency Contact: [place] Phone: [phone]  Work Phone: [phone]  Mobile Phone: [phone]  Relation: Mother     Transportation: mother will transport home    Therapy Recommendations:    OT = n/a    PT = n/a    SLP =      RT Home O2 Evaluation =  n/a    Wound Care =  n/a    Case Management Assessment    ABUSE/NEGLECT SCREENING   Physical Abuse/Neglect: Denies   Sexual Abuse: Denies   Sexual Abuse: Denies   Other Abuse/Issues: Denies          PRIMARY DECISION MAKER                                   CARE MANAGEMENT INTERVENTIONS   Readmission Interview Completed: Not Applicable   PCP Verified by CM: Janell Quiet)   Last Visit to PCP: (july 2020)   Palliative Care Criteria Met (RRAT>21 & CHF Dx)?: No   Mode of Transport at Discharge: Self(mom can transport home)       Transition of Care Consult (CM Consult): (tbd)            MyChart Signup: Yes   Discharge Durable Medical Equipment: No   Physical Therapy Consult: No   Occupational Therapy Consult: No   Speech Therapy Consult: No   Current Support Network: Other(lives with mom and  brother)   Reason for Referral: DCP Rounds   History Provided By: Patient   Patient Orientation: Alert and Oriented, Person, Place, Situation, Self   Cognition: Alert   Support System Response: Concerned   Previous Living Arrangement: Lives with Family Independent   Home Accessibility: Steps(lives in a 3 story home. Must go up flight of stairs to access his room)   Prior Functional Level: Independent in ADLs/IADLs   Current Functional Level: Independent in ADLs/IADLs       Can patient return to prior living arrangement: Yes   Ability to make needs known:: Good   Family able to assist with home care needs:: Yes   Pets: Dog   Needs help with pets while hospitalized: No       Types of Needs Identified: Disease Management Education, Treatment Education   Anticipated Discharge Needs: (tbd, most likely none)   Confirm Follow Up Transport: Family                  DISCHARGE LOCATION   Discharge Placement: Home with family assistance

## 2018-09-26 NOTE — Other (Signed)
Bedside and Verbal shift change report given by Presley Raddle   (offgoing nurse). Report included the following information SBAR, Kardex, Intake/Output, MAR, Accordion, Recent Results and Med Rec StatusSR.

## 2018-09-26 NOTE — Other (Signed)
TRANSFER - IN REPORT:    Verbal report received from Hailey, RN(name) on [employer] [facility]  being received from ED(unit) for routine progression of care      Report consisted of patient???s Situation, Background, Assessment and   Recommendations(SBAR).     Information from the following report(s) SBAR, Kardex, Intake/Output, MAR, Accordion, Recent Results and Med Rec Status was reviewed with the receiving nurse.    Opportunity for questions and clarification was provided.      Assessment completed upon patient???s arrival to unit and care assumed.

## 2018-09-26 NOTE — Progress Notes (Addendum)
~  10am - I informed the patient that dr. Huang ordered a covid test. He stated that he had one done in ER before his blood transfusion. I will follow up with lab as im not seeing it in the lab results tab.    1335 - patient called to c/o of shakiness.     1338 - upon entering his room, I saw his hands were shaky. blood sugar was normal at 106. VS: T 98.2, HR 109, RR 18, O2 100%, BP 115/83. Patient denies drinking. He states the last drink he had was in April and he only drinks socially. At the end of my assessment I did not see any more shakiness in his hands. Will continue to monitor.

## 2018-09-26 NOTE — Progress Notes (Signed)
 Patient admitted on 09/25/2018 from home with   Chief Complaint   Patient presents with   . Tingling          The patient is being treated for    PMH:   Past Medical History:   Diagnosis Date   . Asthma    . Hypertension    . Neurological disorder     Bell's palsy        Treatment Team: Treatment Team: Attending Provider: Josepha Planas, DO; Hospitalist: Josepha Planas, DO      The patient has been admitted to the hospital 3 times in the past 12 months.    Previous 4 Admission Dates Admission and Discharge Diagnosis Interventions Barriers Disposition   08/18/18 Intractable N&V ID NONE HOME   08/06/18 AKI/Dehydration MED NONE HOME   07/21/18 AKI MED NONE HOME   04/10/18 Rash MED NONE HOME     Patient and Family/Caregivers Goals of Care: home with family assistance    Caregivers Participating in Plan of Care/Discharge Plan with the patient: mother    Tentative dc plan: home with family assistance    Anticipated DME needs for discharge: none    PRESCREENING COMPLETED FOR SNF n/a*      Does the patient have appropriate clothing available to be worn at discharge? yes      Anticipated Discharge Date: 09/30/18    The plan of care and discharge plan has been discussed with Josepha Planas, DO and all other appropriate providers and adjusted per interdisciplinary team recommendations and in discussion with the patient and the patient designated Care Plan Participants.    Barriers to Healthcare Success/ Readmission Risk Factors: none    Consults:  Palliative Care Consult Recommended: n/a  Transitional Care Clinic Referral: n/a  Transitional Nurse Navigator Referral: n/a  Oncology Navigator Referral: n/a  SW consulted: n/a  [facility] (formerly [facility]) Consulted: n/a  Outside Hospital/Community Resources Referrals and Collaboration: n/a    Food/Nutrition Needs:   n/a                   Dietician Consulted: n/a    RRAT Score: Low Risk            8       Total Score        4 IP Visits Last 12 Months (1-3=4, 4=9, >4=11)    4 Pt.  Coverage (Medicare=5 , Medicaid, or Self-Pay=4)        Criteria that do not apply:    Has Seen PCP in Last 6 Months (Yes=3, No=0)    Married. Living with Significant Other. Assisted Living. LTAC. SNF. or   Rehab    Patient Length of Stay (>5 days = 3)    Charlson Comorbidity Score (Age + Comorbid Conditions)           PCP: [name], [name], MD . How do you get to your doctor appointments?    Specialists: none    Dialysis Unit: n/a    [facility] [place] [facility]. Are there any medications that you have trouble paying for? Any difficulty getting your medications?    DME available at Home:none    Home O2 L Flow:  n/a            Home O2 Provider: n/a    Home Environment and Prior Level of Function: Lives at [address]  [place] [address] @HOMEPHONE @. Lives with mother in a multistory with a flight of  steps into home.  Responsibilities at home include : independent    Prior to admission open services: none    Home Health Agency n/a  Personal Care Agency n/a    Extended Emergency Contact Information  Primary Emergency Contact: Brooks,Grindle   [place]  Home Phone: [phone]  Work Phone: [phone]  Mobile Phone: [phone]  Relation: Mother     Transportation: mother will transport home    Therapy Recommendations:    OT = n/a    PT = n/a    SLP =      RT Home O2 Evaluation =  n/a    Wound Care =  n/a    Case Management Assessment    ABUSE/NEGLECT SCREENING   Physical Abuse/Neglect: Denies   Sexual Abuse: Denies   Sexual Abuse: Denies   Other Abuse/Issues: Denies          PRIMARY DECISION MAKER                                   CARE MANAGEMENT INTERVENTIONS   Readmission Interview Completed: Not Applicable   PCP Verified by CM: Paullette Sells)   Last Visit to PCP: (july 2020)   Palliative Care Criteria Met (RRAT>21 & CHF Dx)?: No   Mode of Transport at Discharge: Self(mom can transport home)       Transition of Care Consult (CM Consult): (tbd)            MyChart Signup: Yes   Discharge Durable Medical Equipment: No   Physical Therapy Consult: No   Occupational Therapy Consult: No   Speech Therapy Consult: No   Current Support Network: Other(lives with mom and  brother)   Reason for Referral: DCP Rounds   History Provided By: Patient   Patient Orientation: Alert and Oriented, Person, Place, Situation, Self   Cognition: Alert   Support System Response: Concerned   Previous Living Arrangement: Lives with Family Independent   Home Accessibility: Steps(lives in a 3 story home. Must go up flight of stairs to access his room)   Prior Functional Level: Independent in ADLs/IADLs   Current Functional Level: Independent in ADLs/IADLs       Can patient return to prior living arrangement: Yes   Ability to make needs known:: Good   Family able to assist with home care needs:: Yes   Pets: Dog   Needs help with pets while hospitalized: No       Types of Needs Identified: Disease Management Education, Treatment Education   Anticipated Discharge Needs: (tbd, most likely none)   Confirm Follow Up Transport: Family                  DISCHARGE LOCATION   Discharge Placement: Home with family assistance

## 2018-09-26 NOTE — Progress Notes (Signed)
1045 - paged dr. Renaldo Reel    1048 - I informed MD that the patient's mother is requesting a phone call from her. I also informed her that the patient is requesting immodium and that he just had a loose stool with a small amount of blood. I also asked if she could order the patient's HIV meds (byktarvy); MD stated that it was in the sign and held orders.

## 2018-09-26 NOTE — Progress Notes (Signed)
~  10am - I informed the patient that dr. Renaldo Reel ordered a covid test. He stated that he had one done in ER before his blood transfusion. I will follow up with lab as im not seeing it in the lab results tab.    1335 - patient called to c/o of shakiness.     1338 - upon entering his room, I saw his hands were shaky. blood sugar was normal at 106. VS: T 98.2, HR 109, RR 18, O2 100%, BP 115/83. Patient denies drinking. He states the last drink he had was in April and he only drinks socially. At the end of my assessment I did not see any more shakiness in his hands. Will continue to monitor.

## 2018-09-26 NOTE — H&P (Signed)
Admission History and Physical    Travis [name] is a 28 y.o. male who is being admitted.    Impression and Plan:     1 severe hypocalcemia  2 hypokalemia  3 hypomagnesia  4 HIV  5 hypertension  6 asthma    Plan     We will give [name] calcium gluconate  Replace with [name] magnesium  Give potassium p.o.  Recheck labs in a.m., ionized calcium, magnesium, potassium.  Continue home HIV medication  DVT prophylaxis with Lovenox  Advanced care plan: Patient full code    Continue home regimen for otherwise chronic, stable medical conditions as noted above. Discussed with patient regarding management, prognosis, treatment and complications of the patient's medical conditions discussed in detail. All questions answered to the satisfaction of those individual(s) who also verbalized understanding of and agreement with the assessment and plan.     Date of anticipated discharge: Likely tomorrow    Chief Complaint   Patient presents with   ??? Tingling       HPI:  Travis [name] is a 28 y.o. male with PMH sig for HIV, currently on antiretroviral therapy.  He presents today for evaluation of tingling that is diffuse.  He states it is worse in his fingers and his toes, he notes no weakness, no numbness, no lateralizing symptoms.  He called his doctor who advised him to come to the ER.  He denies any fever or chills, nausea vomiting, abdominal pain, diarrhea constipation.      Past Medical History:   Diagnosis Date   ??? Asthma    ??? Hypertension    ??? Neurological disorder     Bell's palsy     History reviewed. No pertinent surgical history.  Social History     Socioeconomic History   ??? Marital status: SINGLE     Spouse name: Not on file   ??? Number of children: Not on file   ??? Years of education: Not on file   ??? Highest education level: Not on file   Occupational History   ??? Not on file   Social Needs   ??? Financial resource strain: Not on file   ??? Food insecurity     Worry: Not on file     Inability: Not on file   ??? Transportation needs      Medical: Not on file     Non-medical: Not on file   Tobacco Use   ??? Smoking status: Never Smoker   ??? Smokeless tobacco: Never Used   Substance and Sexual Activity   ??? Alcohol use: No   ??? Drug use: No   ??? Sexual activity: Yes     Partners: Female     Birth control/protection: Condom   Lifestyle   ??? Physical activity     Days per week: Not on file     Minutes per session: Not on file   ??? Stress: Not on file   Relationships   ??? Social Wellsite geologistconnections     Talks on phone: Not on file     Gets together: Not on file     Attends religious service: Not on file     Active member of club or organization: Not on file     Attends meetings of clubs or organizations: Not on file     Relationship status: Not on file   ??? Intimate partner violence     Fear of current or ex partner: Not on file  Emotionally abused: Not on file     Physically abused: Not on file     Forced sexual activity: Not on file   Other Topics Concern   ??? Not on file   Social History Narrative   ??? Not on file     family history: Significant for hypertension  No Known Allergies    Home Medications:     Prior to Admission Medications   Prescriptions Last Dose Informant Patient Reported? Taking?   albuterol (PROAIR HFA) 90 mcg/actuation inhaler 08/25/2018 at Unknown time  No Yes   Sig: Take 1 Puff by inhalation every four (4) hours as needed for Wheezing.   bictegrav-emtricit-tenofov ala (BIKTARVY) tab tablet 09/19/2018 at Unknown time  No Yes   Sig: Take 1 Tab by mouth daily.   fluconazole (Diflucan) 200 mg tablet Not Taking at Unknown time  No No   Sig: Take 1 Tab by mouth daily for 30 days. FDA advises cautious prescribing of oral fluconazole in pregnancy.   promethazine (PHENERGAN) 12.5 mg tablet 09/24/2018 at Unknown time  No Yes   Sig: Take 1 Tab by mouth every six (6) hours as needed for Nausea.   trimethoprim-sulfamethoxazole (BACTRIM DS, SEPTRA DS) 160-800 mg per tablet Not Taking at Unknown time  No No   Sig: Take 1 Tab by mouth every Monday, Wednesday,  Friday.      Facility-Administered Medications: None     Review of Systems:   Constitutional: Negative for fever, chills, diaphoresis and unexpected weight change.   HENT: Negative for ear pain, congestion, sore throat, rhinorrhea, drooling, trouble swallowing, neck pain and tinnitus.   Eyes: Negative for photophobia, pain, redness and visual disturbance.   Respiratory: negative for shortness of breath, cough, chest tightness, wheezing or stridor.   Cardiovascular: Negative for chest pain, palpitations and leg swelling.   Gastrointestinal: Negative for nausea, vomiting, abdominal pain, diarrhea, constipation, blood in stool, abdominal distention and anal bleeding.   Genitourinary: Negative for dysuria, urgency, frequency, hematuria, flank pain and difficulty urinating.   Musculoskeletal: Negative for back pain and arthralgias.   Skin: Negative for color change, rash and wound.   Neurological: Negative for dizziness, seizures, syncope, speech difficulty, light-headedness or headaches.  Positive numbness and tingling  Hematological: Does not bruise/bleed easily.   Psychiatric/Behavioral: Negative for suicidal ideas, hallucinations, behavioral problems, self-injury or agitation     Physical Assessment:     Visit Vitals  BP 129/81 (BP 1 Location: Right arm, BP Patient Position: Supine)   Pulse (!) 101   Temp 99 ??F (37.2 ??C)   Resp 18   Ht 5\' 9"  (1.753 m)   Wt 64.9 kg (143 lb)   SpO2 100%   BMI 21.12 kg/m??       GENERAL: in moderate distress.   HEENT: Normocephalic and atraumatic head. Oral mucosa moist. No thrush.   Eyes: Pupils are equal, reactive to light and accommodation. Normal extra ocular movements. No icterus.   NECK: Supple. Trachea midline.   RESPIRATORY: No use of accessory muscles of respiration. Bilateral BS present, decreased at bases. No rales or rhonchi.   CARDIOVASCULAR: S1 and S2 present, regular. No murmur, rub, or thrill. Capillary refill delayed. Peripheral pulses including radial pulse, dorsalis  pedis symmetrical, weak.   ABDOMEN: Soft and nontender with positive bowel sounds. No organomegaly.   EXTREMITIES: No edema. No calf tenderness.   NEUROLOGICAL: Cranial nerves II through XII grossly intact. No focal weakness.   SKIN: No rash. Skin is warm, dry, and  intact.   MUSCULOSKELETAL: No joint effusions or joint tenderness.   LYMPH NODES: No axillary, cervical, or supraclavicular lymphadenopathy.   PSYCHIATRIC: Normal mood and affect     Dragon medical dictation software was used for portions of this report. Unintended errors may occur.     Barkley Boards, D.O.  [facility]

## 2018-09-27 LAB — RENAL FUNCTION PANEL
Albumin: 1.3 gm/dl — ABNORMAL LOW (ref 3.4–5.0)
Albumin: 1.3 gm/dl — ABNORMAL LOW (ref 3.4–5.0)
Anion Gap: 4 mmol/L — ABNORMAL LOW (ref 5–15)
Anion gap: 4 mmol/L — ABNORMAL LOW (ref 5–15)
BUN: 5 mg/dl — ABNORMAL LOW (ref 7–25)
BUN: 5 mg/dl — ABNORMAL LOW (ref 7–25)
CO2: 23 mEq/L (ref 21–32)
CO2: 23 mEq/L (ref 21–32)
Calcium: 5.5 mg/dl — CL (ref 8.5–10.1)
Calcium: 5.5 mg/dl — CL (ref 8.5–10.1)
Chloride: 115 mEq/L — ABNORMAL HIGH (ref 98–107)
Chloride: 115 mEq/L — ABNORMAL HIGH (ref 98–107)
Creatinine: 0.7 mg/dl (ref 0.6–1.3)
Creatinine: 0.7 mg/dl (ref 0.6–1.3)
EGFR IF NonAfrican American: >60
GFR African American: >60
GFR est AA: >60
GFR est non-AA: >60
Glucose: 80 mg/dl (ref 74–106)
Glucose: 80 mg/dl (ref 74–106)
Phosphorus: 1 mg/dl — CL (ref 2.5–4.9)
Phosphorus: 1 mg/dl — CL (ref 2.5–4.9)
Potassium: 3.3 mEq/L — ABNORMAL LOW (ref 3.5–5.1)
Potassium: 3.3 mEq/L — ABNORMAL LOW (ref 3.5–5.1)
Sodium: 142 mEq/L (ref 136–145)
Sodium: 142 mEq/L (ref 136–145)

## 2018-09-27 LAB — COVID-19: COVID-19: NEGATIVE

## 2018-09-27 LAB — MAGNESIUM
Magnesium: 1.7 mg/dl (ref 1.6–2.6)
Magnesium: 1.7 mg/dl (ref 1.6–2.6)

## 2018-09-27 LAB — CBC WITH AUTO DIFFERENTIAL
Anisocytosis: 1
Eosinophils %: 3.1 % (ref 0–5)
Hematocrit: 23.5 % — ABNORMAL LOW (ref 37.0–50.0)
Hemoglobin: 7.7 gm/dl — ABNORMAL LOW (ref 12.4–17.2)
Hypochromasia: 2
Lymphocytes %: 3.1 % — ABNORMAL LOW (ref 28–48)
MACROCYTES, MACROCYT: 1
MCH: 28.5 pg (ref 23.0–34.6)
MCHC: 32.8 gm/dl (ref 30.0–36.0)
MCV: 87 fL (ref 80.0–98.0)
MPV: 12.5 fL — ABNORMAL HIGH (ref 6.0–10.0)
Monocytes %: 12.2 % (ref 1–13)
Neutrophils %: 81.6 % — ABNORMAL HIGH (ref 34–64)
Nucleated RBCs: 0 (ref 0–0)
POLYCHROMASIA: 1
Platelet Comment: NORMAL
Platelets: 304 10*3/uL (ref 140–450)
Poikilocytosis: 2
RBC: 2.7 M/uL — ABNORMAL LOW (ref 3.80–5.70)
RDW-SD: 52.9 — ABNORMAL HIGH (ref 35.1–43.9)
WBC: 3.3 10*3/uL — ABNORMAL LOW (ref 4.0–11.0)

## 2018-09-27 LAB — CALCIUM, IONIZED
CALCIUM,IONIZED: 3.2 mg/dl — ABNORMAL LOW (ref 4.4–5.4)
CALCIUM,IONIZED: 3.2 mg/dl — ABNORMAL LOW (ref 4.4–5.4)

## 2018-09-27 LAB — CBC WITH AUTOMATED DIFF
Anisocytosis: 1
EOSINOPHILS: 3.1 % (ref 0–5)
HCT: 23.5 % — ABNORMAL LOW (ref 37.0–50.0)
HGB: 7.7 gm/dl — ABNORMAL LOW (ref 12.4–17.2)
HYPOCHROMASIA: 2
LYMPHOCYTES: 3.1 % — ABNORMAL LOW (ref 28–48)
MCH: 28.5 pg (ref 23.0–34.6)
MCHC: 32.8 gm/dl (ref 30.0–36.0)
MCV: 87 fL (ref 80.0–98.0)
MONOCYTES: 12.2 % (ref 1–13)
MPV: 12.5 fL — ABNORMAL HIGH (ref 6.0–10.0)
Macrocytes: 1
NEUTROPHILS: 81.6 % — ABNORMAL HIGH (ref 34–64)
NRBC: 0 (ref 0–0)
PLATELET COMMENTS: NORMAL
PLATELET: 304 10*3/uL (ref 140–450)
Poikilocytosis: 2
Polychromasia: 1
RBC: 2.7 M/uL — ABNORMAL LOW (ref 3.80–5.70)
RDW-SD: 52.9 — ABNORMAL HIGH (ref 35.1–43.9)
WBC: 3.3 10*3/uL — ABNORMAL LOW (ref 4.0–11.0)

## 2018-09-27 LAB — COVID-19 (INPATIENT TESTING): COVID-19: NEGATIVE

## 2018-09-27 MED ORDER — POTASSIUM & SODIUM PHOSPHATES 280 MG-160 MG-250 MG ORAL POWDER PACKET
280-160-250 mg | Freq: Four times a day (QID) | ORAL | Status: DC
Start: 2018-09-27 — End: 2018-09-27

## 2018-09-27 MED ORDER — CALCIUM GLUCONATE 2 GRAM/100 ML IN SODIUM CHLORIDE (ISO-OSM) IVPB
2 gram/100 mL | Freq: Once | INTRAVENOUS | Status: AC
Start: 2018-09-27 — End: 2018-09-27
  Administered 2018-09-27: 16:00:00 via INTRAVENOUS

## 2018-09-27 MED ORDER — POTASSIUM PHOSPHATE, MONOBASIC 500 MG TAB
500 mg | Freq: Four times a day (QID) | ORAL | Status: DC
Start: 2018-09-27 — End: 2018-09-27
  Administered 2018-09-27: 16:00:00 via ORAL

## 2018-09-27 MED ORDER — CALCIUM CARBONATE 200 MG (500 MG) CHEWABLE TAB
200 mg calcium (500 mg) | ORAL_TABLET | Freq: Three times a day (TID) | ORAL | 0 refills | Status: AC
Start: 2018-09-27 — End: 2018-10-07

## 2018-09-27 MED FILL — K-PHOS ORIGINAL 500 MG SOLUBLE TABLET: 500 mg | ORAL | Qty: 1

## 2018-09-27 MED FILL — FLUCONAZOLE 100 MG TAB: 100 mg | ORAL | Qty: 2

## 2018-09-27 MED FILL — ENOXAPARIN 40 MG/0.4 ML SUB-Q SYRINGE: 40 mg/0.4 mL | SUBCUTANEOUS | Qty: 0.4

## 2018-09-27 MED FILL — POTASSIUM CHLORIDE SR 20 MEQ TAB, PARTICLES/CRYSTALS: 20 mEq | ORAL | Qty: 2

## 2018-09-27 MED FILL — CALCIUM GLUCONATE 2 GRAM/100 ML IN SODIUM CHLORIDE (ISO-OSM) IVPB: 2 gram/100 mL | INTRAVENOUS | Qty: 100

## 2018-09-27 MED FILL — CALCIUM CARBONATE 200 MG (500 MG) CHEWABLE TAB: 200 mg calcium (500 mg) | ORAL | Qty: 1

## 2018-09-27 MED FILL — LOPERAMIDE 2 MG CAP: 2 mg | ORAL | Qty: 1

## 2018-09-27 MED FILL — BIKTARVY 50 MG-200 MG-25 MG TABLET: 50-200-25 mg | ORAL | Qty: 1

## 2018-09-27 NOTE — Other (Addendum)
----------  DocumentID: [id number]------------------------------------------------              [facility]                       Patient Education Report         Name: KAICEN, DESENA                  Date: 09/27/2018    MRN: [id number]                    Time: 10:24:21 AM         Patient ordered video: 'Patient Safety: Stay Safe While you are in the Hospital'    from [id number] via phone number: [phone] at 10:24:21 AM    Description: This program outlines some of the precautions patients can take to ensure a speedy recovery without extra complications. The video emphasizes the importance of communicating with the healthcare team.    ----------DocumentID: [id number]------------------------------------------------                       [facility]          Patient Education Report - Discharge Summary        Date: 09/27/2018   Time: 3:21:22 PM   Name: KRYSTOFER, HEVENER   MRN: [id number]      Account Number: [id number]      Education History:        Patient ordered video: 'Patient Safety: Stay Safe While you are in the Hospital' from [id number] on 09/27/2018 10:24:21 AM

## 2018-09-27 NOTE — Progress Notes (Signed)
Problem: Risk for Spread of Infection  Goal: Prevent transmission of infectious organism to others  Description: Prevent the transmission of infectious organisms to other patients, staff members, and visitors.  Outcome: Resolved/Met     Problem: Patient Education:  Go to Education Activity  Goal: Patient/Family Education  Outcome: Resolved/Met     Problem: Falls - Risk of  Goal: *Absence of Falls  Description: Document Schmid Fall Risk and appropriate interventions in the flowsheet.  Outcome: Resolved/Met  Note: Fall Risk Interventions:  Mobility Interventions: Bed/chair exit alarm, Patient to call before getting OOB         Medication Interventions: Bed/chair exit alarm, Patient to call before getting OOB, Teach patient to arise slowly                   Problem: Patient Education: Go to Patient Education Activity  Goal: Patient/Family Education  Outcome: Resolved/Met

## 2018-09-27 NOTE — Progress Notes (Signed)
Patient instructed to call PCM on Monday to make f/u appointment. Instructed to purchase OTC Tums as ordered by MD.patient reported he has a supply of Diflucan at home.

## 2018-09-27 NOTE — Progress Notes (Signed)
Discharge Plan:   Home with family assistance     Discharge Date:     09/27/2018     Assisted Living Facility:    n/a      Home Health Needed:     none    Home Health Agency:    n/a    TCC Referral:     n/a    Medication Assistance given    N         Transportation: Family: mother    Transport Time:    tbd    Family, MD, patient, nurse and facility aware of pickup time    yes

## 2018-09-27 NOTE — Discharge Summary (Signed)
Discharge Summary     Patient ID:  Travis [name], 28 y.o., male  DOB: 08/30/1990    Admit Date: 09/25/2018  Discharge Date:  09/27/2018  Length of stay: 1 day(s)    PCP:  Paulla ForeKapoor, [name], MD    Chief Complaint   Patient presents with   ??? Tingling         Discharge Diagnosis:     1 severe hypocalcemia  2 hypokalemia  3 hypomagnesia  4 HIV  5 hypertension  6 asthma    CONCERNS WHICH NEED CLOSE FOLLOW-UP:  1. Follow-up with PCP in 2 to 3 days to check electrolytes.    Follow-up Information     Follow up With Specialties Details Why Contact Info    Paulla ForeKapoor, [name], MD Family Medicine   [address]  [address]  [place] [id number]  [phone]            Hospital Course:    We will give [name] calcium gluconate  Replace with [name] magnesium  Give potassium p.o.  Recheck labs in a.m., ionized calcium, magnesium, potassium.  Continue home HIV medication  DVT prophylaxis with Lovenox  Advanced care plan: Patient full code    HPI on Admission (per admitting physician):    Travis [name] is a 28 y.o. male with PMH sig for HIV, currently on antiretroviral therapy.  He presents today for evaluation of tingling that is diffuse.  He states it is worse in his fingers and his toes, he notes no weakness, no numbness, no lateralizing symptoms.  He called his doctor who advised him to come to the ER.  He denies any fever or chills, nausea vomiting, abdominal pain, diarrhea constipation.    Operative Procedures:    * No surgery found *      Treatment Team:   Treatment Team: Attending Provider: Faythe GheeHuang, Amed Datta, DO; Hospitalist: Faythe GheeHuang, Jersee Winiarski, DO    Physical Exam on Discharge:  Visit Vitals  BP 124/79 (BP 1 Location: Right arm, BP Patient Position: Supine)   Pulse 92   Temp 98.2 ??F (36.8 ??C)   Resp 16   Ht 5\' 9"  (1.753 m)   Wt 64.9 kg (143 lb)   SpO2 100%   BMI 21.12 kg/m??     Gen - AAOx3, NAD  HEENT - NC/AT, PERRL, EOMI, mucous membranes moist  Neck - supple, no JVD   Cardiac - RRR, S1, S2, no murmurs  Chest/Lungs - clear to auscultation without wheezes or rhonchi  Abdomen - soft, nontender, nondistended, normal bowel sounds  Extremities - no C/C/E  Neuro - CN 2-12 intact. No motor or sensory deficit.  Skin - no rashes or lesions    Current Discharge Medication List      START taking these medications    Details   calcium carbonate (TUMS) 200 mg calcium (500 mg) chew Take 1 Tab by mouth three (3) times daily (with meals) for 10 days.  Qty: 30 Tab, Refills: 0         CONTINUE these medications which have NOT CHANGED    Details   bictegrav-emtricit-tenofov ala (BIKTARVY) tab tablet Take 1 Tab by mouth daily.  Qty: 30 Tab, Refills: 1      promethazine (PHENERGAN) 12.5 mg tablet Take 1 Tab by mouth every six (6) hours as needed for Nausea.  Qty: 20 Tab, Refills: 0      albuterol (PROAIR HFA) 90 mcg/actuation inhaler Take 1 Puff by inhalation every four (4) hours as  needed for Wheezing.  Qty: 1 Inhaler, Refills: 0      trimethoprim-sulfamethoxazole (BACTRIM DS, SEPTRA DS) 160-800 mg per tablet Take 1 Tab by mouth every Monday, Wednesday, Friday.  Qty: 30 Tab, Refills: 1             Most Recent Labs:  Recent Results (from the past 24 hour(s))   GLUCOSE, POC    Collection Time: 09/26/18  1:36 PM   Result Value Ref Range    Glucose (POC) 106 (H) 65 - 105 mg/dL   COVID-19 (INPATIENT TESTING)    Collection Time: 09/26/18  4:34 PM    Specimen: NASOPHARYNGEAL SWAB   Result Value Ref Range    COVID-19 NEGATIVE NEGATIVE     CALCIUM, IONIZED    Collection Time: 09/27/18  5:29 AM   Result Value Ref Range    CALCIUM,IONIZED 3.2 (L) 4.4 - 5.4 mg/dl   CBC WITH AUTOMATED DIFF    Collection Time: 09/27/18  5:29 AM   Result Value Ref Range    WBC 3.3 (L) 4.0 - 11.0 1000/mm3    NEUTROPHILS 81.6 (H) 34 - 64 %    LYMPHOCYTES 3.1 (L) 28 - 48 %    RBC 2.70 (L) 3.80 - 5.70 M/uL    MONOCYTES 12.2 1 - 13 %    HGB 7.7 (L) 12.4 - 17.2 gm/dl    EOSINOPHILS 3.1 0 - 5 %    HCT 23.5 (L) 37.0 - 50.0 %     MCV 87.0 80.0 - 98.0 fL    MCH 28.5 23.0 - 34.6 pg    MCHC 32.8 30.0 - 36.0 gm/dl    PLATELET 304 140 - 450 1000/mm3    MPV 12.5 (H) 6.0 - 10.0 fL    RDW-SD 52.9 (H) 35.1 - 43.9      Poikilocytosis 2      NRBC 0 0 - 0      Anisocytosis 1      HYPOCHROMASIA 2      Polychromasia 1      Macrocytes 1      PLATELET COMMENTS NORMAL     RENAL FUNCTION PANEL    Collection Time: 09/27/18  5:29 AM   Result Value Ref Range    Sodium 142 136 - 145 mEq/L    Potassium 3.3 (L) 3.5 - 5.1 mEq/L    Chloride 115 (H) 98 - 107 mEq/L    CO2 23 21 - 32 mEq/L    Glucose 80 74 - 106 mg/dl    BUN 5 (L) 7 - 25 mg/dl    Creatinine 0.7 0.6 - 1.3 mg/dl    GFR est AA >60      GFR est non-AA >60      Calcium 5.5 (LL) 8.5 - 10.1 mg/dl    Albumin 1.3 (L) 3.4 - 5.0 gm/dl    Phosphorus 1.0 (LL) 2.5 - 4.9 mg/dl    Anion gap 4 (L) 5 - 15 mmol/L   MAGNESIUM    Collection Time: 09/27/18  5:29 AM   Result Value Ref Range    Magnesium 1.7 1.6 - 2.6 mg/dl         XR Results:  Results from Hospital Encounter encounter on 09/25/18   XR CHEST SNGL V    Narrative EXAMINATION: XR CHEST SNGL V    CLINICAL INDICATION: fever , immunocompromised,      COMPARISON:  August 18, 2018    TECHNIQUE: AP portable chest    FINDINGS:  Lungs clear.  No pleural effusion. No pneumothorax. Heart normal.      Impression IMPRESSION:   No acute disease. No change.         CT Results:  Results from Hospital Encounter encounter on 08/06/18   CT ABD PELV WO CONT    Narrative CT Abdomen and Pelvis without    Indication: Abdominal pain. Nausea, vomiting, diarrhea.    Comparison: 07/22/2018.    TECHNIQUE:   CT of the abdomen and pelvis WITHOUT intravenous contrast. Coronal and sagittal  reformations were obtained. Oral contrast was given.    All CT exams at this facility use one or more dose reduction techniques  including automatic exposure control, mA/kV adjustment per patient's size, or  iterative reconstruction technique. DICOM format imaged data is available to   non-affiliated external healthcare facilities or entities on a secure, media  free, reciprocal searchable basis with patient authorization for 12 months  following the date of the study.    DISCUSSION:    ABSENCE OF INTRAVENOUS CONTRAST DECREASES SENSITIVITY FOR DETECTION OF FOCAL  LESIONS AND VASCULAR PATHOLOGY.    LOWER THORAX: Normal.    HEPATOBILIARY: No focal hepatic lesions.  No biliary ductal dilatation.  SPLEEN: No splenomegaly.  PANCREAS: No focal masses or ductal dilatation.    ADRENALS: No adrenal nodules.  KIDNEYS/URETERS: No hydronephrosis, stones, or solid mass lesions.  PELVIC ORGANS/BLADDER: Unremarkable.    PERITONEUM / RETROPERITONEUM: No free air or fluid.  LYMPH NODES: No lymphadenopathy.  VESSELS: Unremarkable.    GI TRACT: Multiple nondistended fluid-filled loops of small bowel with  questionable mild wall thickening. Fluid-filled colon with no significant wall  thickening. No obstruction. Normal appendix.    BONES AND SOFT TISSUES: No acute abnormality.      Impression IMPRESSION:    Numerous fluid-filled loops of small and large bowel with borderline diffuse  small bowel wall thickening, concerning for infectious or inflammatory  enterocolitis. Findings have slightly progressed since 07/22/2018.         MRI Results:  No results found for this or any previous visit.    Nuclear Medicine Results:  No results found for this or any previous visit.    US Results:  Results from Hospital Encounter encounter on 07/21/18   US RUQ    Narrative Clinical history: Elevated liver function studies    EXAMINATION:  Upper quadrant ultrasound 07/28/2018    FINDINGS:  Liver demonstrates homogeneous echotexture. No intrahepatic bile duct  dilatation. Bile duct measures 4 mm. No stones are seen in the gallbladder.  Gallbladder wall measures 2 mm. Sonographic Eulah PontMurphy sign is negative.    Pancreas, proximal aorta and IVC are within normal limits. Right kidney measures   10.4 x 5.6 x 3.7 cm. Normal corticomedullary differentiation. No hydronephrosis.  Small amounts of perihepatic ascites.      Impression IMPRESSION:  Small amounts of perihepatic ascites.         IR Results:  No results found for this or any previous visit.    VAS/US Results:  No results found for this or any previous visit.     Discharge condition:  Stable.     Time spent with discharging patient: 45 minutes.       Dr. Paulla ForeKapoor, [name], MD, thank you for allowing us to participate in the care of this patient.  Please contact me through the hospital if questions arise.    Dragon medical dictation software was used for portions of this report. Unintended errors may occur.  Barkley Boards, D.O.  [facility]

## 2018-09-27 NOTE — Progress Notes (Signed)
Arch wire paged on-call hospitalist and the message was as followed.     [id number], Travis [name]  attending: Huang  admitted for: electrolyte imbalance.  problem: critical labs are calcium: 5.5/ Phosphorus: 1.0  [name], RN [id number]

## 2018-09-27 NOTE — Progress Notes (Signed)
Patient instructed to call PCM on Monday to make f/u appointment. Instructed to purchase OTC Tums as ordered by MD.patient reported he has a supply of Diflucan at home.

## 2018-09-27 NOTE — Discharge Summary (Signed)
Discharge  Summary by Barkley Boards, DO at 09/27/18 1302                Author: Barkley Boards, DO  Service: Hospitalist  Author Type: Physician       Filed: 09/27/18 1303  Date of Service: 09/27/18 1302  Status: Signed          Editor: Barkley Boards, DO (Physician)                                                                                      Discharge Summary        Patient ID:   Travis [name], 28 y.o.,  male   DOB: [birth year]      Admit Date: 09/25/2018   Discharge Date:  09/27/2018   Length of stay: 1 day(s)      PCP:  Travis Quiet, MD        Chief Complaint       Patient presents with        ?  Tingling              Discharge Diagnosis:       1 severe hypocalcemia   2 hypokalemia   3 hypomagnesia   4 HIV   5 hypertension   6 asthma      CONCERNS WHICH NEED CLOSE FOLLOW-UP:   1.  Follow-up with PCP in 2 to 3 days to check electrolytes.        Follow-up Information               Follow up With  Specialties  Details  Why  Contact Info              Travis Quiet, MD  Family Medicine      [address] [place]   [place] [address]   [phone]                   Hospital Course:     We will give [name] calcium gluconate   Replace with [name] magnesium   Give potassium p.o.   Recheck labs in a.m., ionized calcium, magnesium, potassium.   Continue home HIV medication   DVT prophylaxis with Lovenox   Advanced care plan: Patient full code      HPI on Admission (per admitting physician):      Travis [name] is a 28 y.o. male with PMH sig for HIV, currently on antiretroviral therapy.  He presents today for evaluation of tingling that is diffuse.  He states it  is worse in his fingers and his toes, he notes no weakness, no numbness, no lateralizing symptoms.  He called his doctor who advised him to come to the ER.  He denies any fever or chills, nausea vomiting, abdominal pain, diarrhea constipation.      Operative Procedures:     * No surgery found *         Treatment Team:    Treatment Team: Attending  Provider: Barkley Boards, DO; Hospitalist: Barkley Boards, DO      Physical Exam on Discharge:   Visit Vitals  BP  124/79 (BP 1 Location: Right arm, BP Patient Position: Supine)     Pulse  92     Temp  98.2 ??F (36.8 ??C)     Resp  16     Ht  5\' 9"  (1.753 m)     Wt  64.9 kg (143 lb)     SpO2  100%        BMI  21.12 kg/m??        Gen - AAOx3, NAD   HEENT - NC/AT, PERRL, EOMI, mucous membranes moist   Neck - supple, no JVD   Cardiac - RRR, S1, S2, no murmurs   Chest/Lungs - clear to auscultation without wheezes or rhonchi   Abdomen - soft, nontender, nondistended, normal bowel sounds   Extremities - no C/C/E   Neuro - CN 2-12 intact. No motor or sensory deficit.   Skin - no rashes or lesions        Current Discharge Medication List              START taking these medications          Details        calcium carbonate (TUMS) 200 mg calcium (500 mg) chew  Take 1 Tab by mouth three (3) times daily (with meals) for 10 days.   Qty: 30 Tab, Refills:  0                     CONTINUE these medications which have NOT CHANGED          Details        bictegrav-emtricit-tenofov ala (BIKTARVY) tab tablet  Take 1 Tab by mouth daily.   Qty: 30 Tab, Refills:  1               promethazine (PHENERGAN) 12.5 mg tablet  Take 1 Tab by mouth every six (6) hours as needed for Nausea.   Qty: 20 Tab, Refills:  0               albuterol (PROAIR HFA) 90 mcg/actuation inhaler  Take 1 Puff by inhalation every four (4) hours as needed for Wheezing.   Qty: 1 Inhaler, Refills:  0               trimethoprim-sulfamethoxazole (BACTRIM DS, SEPTRA DS) 160-800 mg per tablet  Take 1 Tab by mouth every Monday, Wednesday, Friday.   Qty: 30 Tab, Refills:  1                         Most Recent Labs:     Recent Results (from the past 24 hour(s))     GLUCOSE, POC          Collection Time: 09/26/18  1:36 PM         Result  Value  Ref Range            Glucose (POC)  106 (H)  65 - 105 mg/dL       ZOXWR-60 (INPATIENT TESTING)          Collection Time: 09/26/18  4:34 PM        Specimen: NASOPHARYNGEAL SWAB         Result  Value  Ref Range            COVID-19  NEGATIVE  NEGATIVE         CALCIUM, IONIZED  Collection Time: 09/27/18  5:29 AM         Result  Value  Ref Range            CALCIUM,IONIZED  3.2 (L)  4.4 - 5.4 mg/dl       CBC WITH AUTOMATED DIFF          Collection Time: 09/27/18  5:29 AM         Result  Value  Ref Range            WBC  3.3 (L)  4.0 - 11.0 1000/mm3       NEUTROPHILS  81.6 (H)  34 - 64 %       LYMPHOCYTES  3.1 (L)  28 - 48 %       RBC  2.70 (L)  3.80 - 5.70 M/uL       MONOCYTES  12.2  1 - 13 %       HGB  7.7 (L)  12.4 - 17.2 gm/dl       EOSINOPHILS  3.1  0 - 5 %       HCT  23.5 (L)  37.0 - 50.0 %       MCV  87.0  80.0 - 98.0 fL       MCH  28.5  23.0 - 34.6 pg       MCHC  32.8  30.0 - 36.0 gm/dl       PLATELET  119304  147140 - 450 1000/mm3       MPV  12.5 (H)  6.0 - 10.0 fL       RDW-SD  52.9 (H)  35.1 - 43.9         Poikilocytosis  2          NRBC  0  0 - 0         Anisocytosis  1          HYPOCHROMASIA  2          Polychromasia  1          Macrocytes  1          PLATELET COMMENTS  NORMAL          RENAL FUNCTION PANEL          Collection Time: 09/27/18  5:29 AM         Result  Value  Ref Range            Sodium  142  136 - 145 mEq/L       Potassium  3.3 (L)  3.5 - 5.1 mEq/L       Chloride  115 (H)  98 - 107 mEq/L       CO2  23  21 - 32 mEq/L       Glucose  80  74 - 106 mg/dl       BUN  5 (L)  7 - 25 mg/dl       Creatinine  0.7  0.6 - 1.3 mg/dl       GFR est AA  >82>60          GFR est non-AA  >60          Calcium  5.5 (LL)  8.5 - 10.1 mg/dl       Albumin  1.3 (L)  3.4 - 5.0 gm/dl       Phosphorus  1.0 (LL)  2.5 - 4.9 mg/dl  Anion gap  4 (L)  5 - 15 mmol/L       MAGNESIUM          Collection Time: 09/27/18  5:29 AM         Result  Value  Ref Range            Magnesium  1.7  1.6 - 2.6 mg/dl              XR Results:     Results from Hospital Encounter encounter on 09/25/18     XR CHEST SNGL V           Narrative  EXAMINATION: XR CHEST SNGL V      CLINICAL  INDICATION: fever , immunocompromised,        COMPARISON:  August 18, 2018      TECHNIQUE: AP portable chest      FINDINGS:    Lungs clear.   No pleural effusion. No pneumothorax. Heart normal.              Impression  IMPRESSION:    No acute disease. No change.              CT Results:     Results from Hospital Encounter encounter on 08/06/18     CT ABD PELV WO CONT           Narrative  CT Abdomen and Pelvis without      Indication: Abdominal pain. Nausea, vomiting, diarrhea.      Comparison: 07/22/2018.      TECHNIQUE:    CT of the abdomen and pelvis WITHOUT intravenous contrast. Coronal and sagittal   reformations were obtained. Oral contrast was given.      All CT exams at this facility use one or more dose reduction techniques   including automatic exposure control, mA/kV adjustment per patient's size, or   iterative reconstruction technique. DICOM format imaged data is available to   non-affiliated external healthcare facilities or entities on a secure, media   free, reciprocal searchable basis with patient authorization for 12 months   following the date of the study.      DISCUSSION:      ABSENCE OF INTRAVENOUS CONTRAST DECREASES SENSITIVITY FOR DETECTION OF FOCAL   LESIONS AND VASCULAR PATHOLOGY.      LOWER THORAX: Normal.      HEPATOBILIARY: No focal hepatic lesions.  No biliary ductal dilatation.   SPLEEN: No splenomegaly.   PANCREAS: No focal masses or ductal dilatation.      ADRENALS: No adrenal nodules.   KIDNEYS/URETERS: No hydronephrosis, stones, or solid mass lesions.   PELVIC ORGANS/BLADDER: Unremarkable.      PERITONEUM / RETROPERITONEUM: No free air or fluid.   LYMPH NODES: No lymphadenopathy.   VESSELS: Unremarkable.      GI TRACT: Multiple nondistended fluid-filled loops of small bowel with   questionable mild wall thickening. Fluid-filled colon with no significant wall   thickening. No obstruction. Normal appendix.      BONES AND SOFT TISSUES: No acute abnormality.              Impression   IMPRESSION:      Numerous fluid-filled loops of small and large bowel with borderline diffuse   small bowel wall thickening, concerning for infectious or inflammatory   enterocolitis. Findings have slightly progressed since 07/22/2018.              MRI Results:   No results found for this or any  previous visit.      Nuclear Medicine Results:   No results found for this or any previous visit.      US Results:     Results from Hospital Encounter encounter on 07/21/18     US RUQ           Narrative  Clinical history: Elevated liver function studies      EXAMINATION:   Upper quadrant ultrasound 07/28/2018      FINDINGS:   Liver demonstrates homogeneous echotexture. No intrahepatic bile duct   dilatation. Bile duct measures 4 mm. No stones are seen in the gallbladder.   Gallbladder wall measures 2 mm. Sonographic Eulah PontMurphy sign is negative.      Pancreas, proximal aorta and IVC are within normal limits. Right kidney measures   10.4 x 5.6 x 3.7 cm. Normal corticomedullary differentiation. No hydronephrosis.   Small amounts of perihepatic ascites.              Impression  IMPRESSION:   Small amounts of perihepatic ascites.              IR Results:   No results found for this or any previous visit.      VAS/US Results:   No results found for this or any previous visit.       Discharge condition:  Stable.       Time spent with discharging patient: 45 minutes.          Dr. Paulla ForeKapoor, [name], MD, thank you for allowing us to participate in the care of this patient.  Please contact me through the hospital if questions  arise.      Dragon medical dictation software was used for portions of this report. Unintended errors may occur.       Faythe GheeLisa Chue Berkovich, D.O.   [facility]

## 2018-09-27 NOTE — Progress Notes (Signed)
Arch wire paged on-call hospitalist and the message was as followed.     [id number] Travis [name]  attending: Renaldo Reel  admitted for: electrolyte imbalance.  problem: critical labs are calcium: 5.5/ Phosphorus: 1.0  Joselyn Glassman s., RN [phone]

## 2018-09-27 NOTE — Progress Notes (Signed)
Discharge Plan:   Home with family assistance     Discharge Date:     09/27/2018     Assisted Living Facility:    n/a      Home Health Needed:     none    Home Health Agency:    n/a    TCC Referral:     n/a    Medication Assistance given    N         Transportation: Family: mother    Transport Time:    tbd    Family, MD, patient, nurse and facility aware of pickup time    yes

## 2018-11-04 ENCOUNTER — Inpatient Hospital Stay
Admit: 2018-11-04 | Discharge: 2018-11-05 | Disposition: A | Discharge status: Home / Self Care | Payer: PRIVATE HEALTH INSURANCE | Attending: Emergency Medicine

## 2018-11-04 DIAGNOSIS — M79671 Pain in right foot: Secondary | ICD-10-CM

## 2018-11-04 MED ORDER — SODIUM CHLORIDE 0.9 % IJ SYRG
Freq: Once | INTRAMUSCULAR | Status: AC
Start: 2018-11-04 — End: 2018-11-04
  Administered 2018-11-05: 02:00:00 via INTRAVENOUS

## 2018-11-04 NOTE — ED Provider Notes (Signed)
[facility]  Emergency Department Treatment Report        Patient: Travis [name] Age: 28 y.o. Sex: male    Date of Birth: January 03, 1991 Admit Date: 11/04/2018 PCP: Paulla Fore, MD   MRN: [id number]  CSN: [id number]  Attending: Despina Hick, MD   Room: [id number] Time Dictated: 8:24 PM APP: Caren Griffins, PA-C     Chief Complaint   Chief Complaint   Patient presents with   ??? Foot Swelling            History of Present Illness   28 y.o. male with history of HIV, anemia who presents for evaluation of bilateral foot pain and swelling that has been ongoing for the past 3 days.  Patient reports he noticed paresthesias in the feet 6 days ago and thought it was because they were cold.  He states things got significantly worse Sunday night when he started feeling increasing pain and noticed swelling and some bruising of the feet.  He reports the pain is radiating up the anterior ankles bilaterally now.  He has tried Tylenol and elevation with no relief.  He notes the pain is significantly worse with weightbearing and movement.  He denies fevers, chills, shortness of breath, abdominal pain, nausea, vomiting, diarrhea.  He denies any injuries.  He reports he is compliant with his HIV medications.  He denies history of similar symptoms.  He has no other complaints at this time.    Review of Systems   Review of Systems   Constitutional: Negative for chills and fever.   HENT: Negative for congestion and sore throat.    Eyes: Negative for discharge and redness.   Respiratory: Negative for cough and shortness of breath.    Cardiovascular: Negative for chest pain.        Bilateral feet swelling. Denies calf pain    Gastrointestinal: Negative for abdominal pain, diarrhea, nausea and vomiting.   Genitourinary: Negative for dysuria and frequency.   Musculoskeletal: Negative for myalgias.        Bilateral foot pain    Skin: Negative for rash.   Neurological: Positive for tingling. Negative for dizziness.    Endo/Heme/Allergies: Positive for polydipsia. Bruises/bleeds easily.       Past Medical/Surgical History     Past Medical History:   Diagnosis Date   ??? Asthma    ??? Hypertension    ??? Neurological disorder     Bell's palsy     No past surgical history on file.    Social History     Social History     Socioeconomic History   ??? Marital status: SINGLE     Spouse name: Not on file   ??? Number of children: Not on file   ??? Years of education: Not on file   ??? Highest education level: Not on file   Occupational History   ??? Not on file   Social Needs   ??? Financial resource strain: Not on file   ??? Food insecurity     Worry: Not on file     Inability: Not on file   ??? Transportation needs     Medical: Not on file     Non-medical: Not on file   Tobacco Use   ??? Smoking status: Never Smoker   ??? Smokeless tobacco: Never Used   Substance and Sexual Activity   ??? Alcohol use: No   ??? Drug use: No   ??? Sexual activity: Yes  Partners: Female     Birth control/protection: Condom   Lifestyle   ??? Physical activity     Days per week: Not on file     Minutes per session: Not on file   ??? Stress: Not on file   Relationships   ??? Social Wellsite geologistconnections     Talks on phone: Not on file     Gets together: Not on file     Attends religious service: Not on file     Active member of club or organization: Not on file     Attends meetings of clubs or organizations: Not on file     Relationship status: Not on file   ??? Intimate partner violence     Fear of current or ex partner: Not on file     Emotionally abused: Not on file     Physically abused: Not on file     Forced sexual activity: Not on file   Other Topics Concern   ??? Not on file   Social History Narrative   ??? Not on file       Family History   No family history on file.  None pertinent.   Current Medications   (Not in a hospital admission)      Allergies   No Known Allergies    Physical Exam     ED Triage Vitals [11/04/18 1621]   ED Encounter Vitals Group      BP (!) 148/104       Pulse (Heart Rate) (!) 125      Resp Rate 16      Temp 99.1 ??F (37.3 ??C)      Temp src       O2 Sat (%) 100 %      Weight       Height        Constitutional: Patient appears well developed and well nourished. Appears nontoxic and in no distress.   HENT: Normal cephalic and atraumatic.  Mucous membranes moist, non-erythematous and without lesions. Uvula is midline.  Eyes: Conjunctivae are clear with no discharge.  Neck: Normal ROM and non tender. Supple. No nuchal rigidity.   Respiratory: Breath sounds are equal bilaterally. Lungs clear to auscultation with nonlabored respirations. No tachypnea or accessory muscle use.  Cardiovascular: tachycardic with normal rhythm.  Distal pulses intact and symmetric. Calves soft and non-tender. Brisk capillary refill in all 10 toes.   Gastrointestinal: Bowel sounds normal. Abdomen soft and without complaint of pain to palpation.   Musculoskeletal: Moves upper extremities well. Able to move all toes. TTP with minimal touch of the feet bilaterally. 1-2+ edema of the feet. No erythema or warmth.    Lymphatic: No cervical adenopathy.  Integumentary: Warm and dry.  Hyperpigmented areas of the feet bilaterally on the dorsum and plantar surfaces. Does not involve palms or oral mucosa.             Neurologic: Alert and oriented. No facial asymmetry or dysarthria. Diminished sensation of all 10 toes. Normal sensation of the foot.   Psychological: Behavior is age and situation appropriate. No difficulty with memory.      Impression and Management Plan   This is a 28 y.o. male with bilateral foot paresthesias and swelling.  Patient is afebrile.  Heart rate is 125 bmp on arrival.  He is mildly hypertensive.  On exam he is nontoxic-appearing and in no distress.  There is significant tenderness with minimal palpation of the feet.  There is  no erythema or increased warmth to suggest infection.  There is swelling of the feet bilaterally which seems to be isolated to the feet.  Feet are  warm and well-perfused. There are scattered hyperpigmented areas of the dorsum and plantar surfaces of the feet bilaterally. Does not appear bruised, as patient had suspected. No particular tenderness of the hyperpigmented areas. Will obtain appropriate studies to evaluate the patient's complaints.     Diagnostic Studies   Lab:   Recent Results (from the past 24 hour(s))   CBC WITH AUTOMATED DIFF    Collection Time: 11/04/18  9:00 PM   Result Value Ref Range    WBC 2.9 (L) 4.0 - 11.0 1000/mm3    RBC 3.45 (L) 3.80 - 5.70 M/uL    HGB 10.9 (L) 12.4 - 17.2 gm/dl    HCT 16.134.4 (L) 09.637.0 - 50.0 %    MCV 99.7 (H) 80.0 - 98.0 fL    MCH 31.6 23.0 - 34.6 pg    MCHC 31.7 30.0 - 36.0 gm/dl    PLATELET 045393 409140 - 811450 1000/mm3    MPV 10.2 (H) 6.0 - 10.0 fL    RDW-SD 63.1 (H) 35.1 - 43.9      NRBC 0 0 - 0      IMMATURE GRANULOCYTES 1.4 0.0 - 3.0 %    NEUTROPHILS 67.1 (H) 34 - 64 %    LYMPHOCYTES 18.8 (L) 28 - 48 %    MONOCYTES 9.2 1 - 13 %    EOSINOPHILS 2.1 0 - 5 %    BASOPHILS 1.4 0 - 3 %   METABOLIC PANEL, BASIC    Collection Time: 11/04/18  9:00 PM   Result Value Ref Range    Sodium 135 (L) 136 - 145 mEq/L    Potassium 4.3 3.5 - 5.1 mEq/L    Chloride 106 98 - 107 mEq/L    CO2 22 21 - 32 mEq/L    Glucose 86 74 - 106 mg/dl    BUN 16 7 - 25 mg/dl    Creatinine 0.9 0.6 - 1.3 mg/dl    GFR est AA >91>60      GFR est non-AA >60      Calcium 8.7 8.5 - 10.1 mg/dl    Anion gap 7 5 - 15 mmol/L   MAGNESIUM    Collection Time: 11/04/18  9:00 PM   Result Value Ref Range    Magnesium 1.9 1.6 - 2.6 mg/dl     Labs Reviewed   CBC WITH AUTOMATED DIFF - Abnormal; Notable for the following components:       Result Value    WBC 2.9 (*)     RBC 3.45 (*)     HGB 10.9 (*)     HCT 34.4 (*)     MCV 99.7 (*)     MPV 10.2 (*)     RDW-SD 63.1 (*)     NEUTROPHILS 67.1 (*)     LYMPHOCYTES 18.8 (*)     All other components within normal limits   METABOLIC PANEL, BASIC - Abnormal; Notable for the following components:    Sodium 135 (*)      All other components within normal limits   MAGNESIUM       Imaging:    No results found.    ED Course/Medical Decision Making   Labs show WBC count of 2.9. H/H is much improved. Platelets WNL. Creatinine is WNL. Electrolytes unremarkable. On reexamination, patient feeling improved after pain medication.  HR has improved after pain medication as well and is currently 107 on the monitor on my reading. Patient was given prescription for pain medication and instructed to follow up with his infectious disease doctor. He was advised to elevate the legs as much as possible.     Patient has not developed any new or worsening symptoms and has remained clinically stable throughout his course in the ED. We feel the patient is stable for discharge at this time to continue outpatient management and follow up. Patient was instructed to return to the ER if condition worsens or new symptoms develop. The patient indicates understanding of these issues and agrees with the plan.          Medications   sodium chloride (NS) flush 5-10 mL (10 mL IntraVENous Given 11/04/18 2205)   morphine injection 6 mg (6 mg IntraVENous Given 11/04/18 2249)   oxyCODONE-acetaminophen (PERCOCET) 5-325 mg per tablet 1 Tab (1 Tab Oral Given 11/04/18 2350)       Final Diagnosis       ICD-10-CM ICD-9-CM   1. Bilateral foot pain  M79.671 729.5    M79.672    2. Rash  R21 782.1       Disposition   Discharged in stable condition.    Discharge Medication List as of 11/04/2018 11:37 PM      START taking these medications    Details   oxyCODONE-acetaminophen (Percocet) 5-325 mg per tablet Take 1 Tab by mouth every eight (8) hours as needed for Pain for up to 5 days. Max Daily Amount: 3 Tabs., Normal, Disp-12 Tab,R-0         CONTINUE these medications which have NOT CHANGED    Details   trimethoprim-sulfamethoxazole (BACTRIM DS, SEPTRA DS) 160-800 mg per tablet Take 1 Tab by mouth every Monday, Wednesday, Friday., Print, Disp-30 Tab, R-1       bictegrav-emtricit-tenofov ala (BIKTARVY) tab tablet Take 1 Tab by mouth daily., Print, Disp-30 Tab, R-1      promethazine (PHENERGAN) 12.5 mg tablet Take 1 Tab by mouth every six (6) hours as needed for Nausea., Print, Disp-20 Tab, R-0      albuterol (PROAIR HFA) 90 mcg/actuation inhaler Take 1 Puff by inhalation every four (4) hours as needed for Wheezing., Print, Disp-1 Inhaler, R-0             Patient has been evaluated by myself and Tawanna Cooler, MD who agrees with the above assessment and plan.    Fredda Hammed, PA-C  November 05, 2018  8:24 PM      My signature above authenticates this document and my orders, the final diagnosis(es), discharge prescription(s), and instructions in the Epic record. If you have any questions please contact [phone].     Nursing notes have been reviewed by the physician and advanced practice clinician.

## 2018-11-04 NOTE — ED Notes (Signed)
11:53 PM  11/04/18     Discharge instructions given to patient (name) with verbalization of understanding. Patient accompanied by mom.  Patient discharged with the following prescriptions percocet sent to pts pharmacy-pt notified. Patient discharged to home (destination).      Maryanne T. Gable, RN

## 2018-11-04 NOTE — ED Triage Notes (Signed)
C/o B feet swelling x 3 days. Stated he's hurting but feet feels numb, has bruising. Denies any injury.

## 2018-11-04 NOTE — ED Notes (Signed)
The mid-level provider and I discussed the patient's history, physical exam, differential diagnosis, and ancillary information.  I personally saw and examined the patient. I have reviewed and agree with the midlevel findings, including all diagnostic interpretations, and plans as written. I was present during the key portions of separately billed procedures.      Portions of this chart were created with Dragon medical speech to text program.   Unrecognized errors may be present.    Patient with bilateral foot pain easily palpable dorsalis pedis pulses, there is hyperpigmented discolorations at the instep of both feet, these are exquisitely tender to palpation.  There is no fluctuance there is no surrounding erythema.  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  Labs are otherwise unremarkable, he is on antiretroviral therapy, agree with pain medication follow-up with ID

## 2018-11-04 NOTE — ED Provider Notes (Signed)
ED Provider Notes by Caren Griffins, PA-C at 11/04/18 2024                Author: Caren Griffins, PA-C  Service: Emergency Medicine  Author Type: Physician Assistant       Filed: 11/05/18 0445  Date of Service: 11/04/18 2024  Status: Attested           Editor: Bitzer, Philip Aspen (Physician Assistant)  Cosigner: Despina Hick, MD at 11/06/18 1847          Attestation signed by Despina Hick, MD at 11/06/18 1847          The mid-level provider and I discussed the patient's history, physical exam, differential diagnosis, and ancillary information.  I personally saw and examined  the patient. I have reviewed and agree with the midlevel findings, including all diagnostic interpretations, and plans as written. I was present during the key portions of separately billed procedures.      Additional documentation present in free text note.          Despina Hick, MD                                    [facility]   Emergency Department Treatment Report                Patient: Travis [name]  Age: 28 y.o.  Sex: male          Date of Birth: May 21, 1990  Admit Date: 11/04/2018  PCP: Paulla Fore, MD     MRN: [id number]   CSN: [id number]   Attending: Despina Hick, MD         Room: [id number]  Time Dictated: 8:24 PM  APP: Caren Griffins, PA-C        Chief Complaint      Chief Complaint       Patient presents with        ?  Foot Swelling                  History of Present Illness     28 y.o. male  with history of HIV, anemia who presents for evaluation of bilateral foot pain and swelling that has been ongoing for the past 3 days.  Patient reports he noticed paresthesias in the feet 6 days ago and thought it was because they were cold.  He states  things got significantly worse Sunday night when he started feeling increasing pain and noticed swelling and some bruising of the feet.  He reports the pain is radiating up the anterior ankles bilaterally now.  He has tried Tylenol and  elevation with  no relief.  He notes the pain is significantly worse with weightbearing and movement.  He denies fevers, chills, shortness of breath, abdominal pain, nausea, vomiting, diarrhea.  He denies any injuries.  He reports he is compliant with his HIV medications.   He denies history of similar symptoms.  He has no other complaints at this time.        Review of Systems     Review of Systems    Constitutional: Negative for chills and fever.    HENT: Negative for congestion and sore throat.     Eyes: Negative for discharge and redness.    Respiratory: Negative for cough and shortness of breath.     Cardiovascular: Negative  for chest pain.         Bilateral feet swelling. Denies calf pain     Gastrointestinal: Negative for abdominal pain, diarrhea, nausea and vomiting.    Genitourinary: Negative for dysuria and frequency.    Musculoskeletal: Negative for myalgias.         Bilateral foot pain     Skin: Negative for rash.    Neurological: Positive for tingling. Negative for dizziness.    Endo/Heme/Allergies: Positive for polydipsia. Bruises/bleeds easily .            Past Medical/Surgical History          Past Medical History:        Diagnosis  Date         ?  Asthma       ?  Hypertension       ?  Neurological disorder            Bell's palsy        No past surgical history on file.        Social History          Social History          Socioeconomic History         ?  Marital status:  SINGLE              Spouse name:  Not on file         ?  Number of children:  Not on file     ?  Years of education:  Not on file     ?  Highest education level:  Not on file       Occupational History        ?  Not on file       Social Needs         ?  Financial resource strain:  Not on file        ?  Food insecurity              Worry:  Not on file         Inability:  Not on file        ?  Transportation needs              Medical:  Not on file         Non-medical:  Not on file       Tobacco Use         ?  Smoking status:  Never  Smoker     ?  Smokeless tobacco:  Never Used       Substance and Sexual Activity         ?  Alcohol use:  No     ?  Drug use:  No     ?  Sexual activity:  Yes              Partners:  Female         Birth control/protection:  Condom       Lifestyle        ?  Physical activity              Days per week:  Not on file         Minutes per session:  Not on file         ?  Stress:  Not on file       Relationships        ?  Social Engineer, manufacturing systems on phone:  Not on file         Gets together:  Not on file         Attends religious service:  Not on file         Active member of club or organization:  Not on file         Attends meetings of clubs or organizations:  Not on file         Relationship status:  Not on file        ?  Intimate partner violence              Fear of current or ex partner:  Not on file         Emotionally abused:  Not on file         Physically abused:  Not on file         Forced sexual activity:  Not on file        Other Topics  Concern        ?  Not on file       Social History Narrative        ?  Not on file             Family History     No family history on file.   None pertinent.      Current Medications     (Not in a hospital admission)           Allergies     No Known Allergies        Physical Exam          ED Triage Vitals [11/04/18 1621]     ED Encounter Vitals Group           BP  (!) 148/104        Pulse (Heart Rate)  (!) 125        Resp Rate  16        Temp  99.1 ??F (37.3 ??C)        Temp src          O2 Sat (%)  100 %        Weight             Height             Constitutional: Patient appears well developed and well nourished. Appears nontoxic and in no distress.    HENT: Normal cephalic and atraumatic.   Mucous membranes moist, non-erythematous and without lesions. Uvula is midline.   Eyes: Conjunctivae are clear with no discharge.   Neck: Normal ROM and non tender. Supple. No nuchal rigidity.    Respiratory: Breath sounds are equal bilaterally. Lungs clear to auscultation  with nonlabored respirations. No tachypnea or accessory muscle  use.   Cardiovascular: tachycardic with normal rhythm.  Distal pulses intact and symmetric. Calves soft and non-tender. Brisk capillary refill in  all 10 toes.    Gastrointestinal: Bowel sounds normal. Abdomen soft and without complaint of pain to palpation.    Musculoskeletal: Moves upper extremities well. Able to move all toes. TTP with minimal touch of the feet bilaterally. 1-2+ edema of the feet.  No erythema or warmth.     Lymphatic: No cervical adenopathy.   Integumentary: Warm and dry.   Hyperpigmented areas of the feet bilaterally on the dorsum and  plantar surfaces. Does not involve palms or oral mucosa.                       Neurologic: Alert and oriented. No facial asymmetry or dysarthria. Diminished sensation of all 10 toes. Normal sensation of the foot.    Psychological: Behavior is age and situation appropriate. No difficulty with memory.          Impression and Management Plan     This is a 28 y.o.  male with bilateral foot paresthesias and swelling.  Patient is afebrile.  Heart rate is 125 bmp on arrival.  He is mildly hypertensive.  On exam he is nontoxic-appearing and in no distress.  There  is significant tenderness with minimal palpation of the feet.  There is no erythema or increased warmth to suggest infection.  There is swelling of the feet bilaterally which seems to be isolated to the feet.  Feet are warm and well-perfused. There are  scattered hyperpigmented areas of the dorsum and plantar surfaces of the feet bilaterally. Does not appear bruised, as patient had suspected. No particular tenderness of the hyperpigmented areas. Will obtain appropriate studies to evaluate the patient's  complaints.         Diagnostic Studies     Lab:      Recent Results (from the past 24 hour(s))     CBC WITH AUTOMATED DIFF          Collection Time: 11/04/18  9:00 PM         Result  Value  Ref Range            WBC  2.9 (L)  4.0 - 11.0 1000/mm3        RBC  3.45 (L)  3.80 - 5.70 M/uL       HGB  10.9 (L)  12.4 - 17.2 gm/dl       HCT  13.0 (L)  86.5 - 50.0 %       MCV  99.7 (H)  80.0 - 98.0 fL       MCH  31.6  23.0 - 34.6 pg       MCHC  31.7  30.0 - 36.0 gm/dl       PLATELET  784  696 - 450 1000/mm3       MPV  10.2 (H)  6.0 - 10.0 fL       RDW-SD  63.1 (H)  35.1 - 43.9         NRBC  0  0 - 0         IMMATURE GRANULOCYTES  1.4  0.0 - 3.0 %       NEUTROPHILS  67.1 (H)  34 - 64 %       LYMPHOCYTES  18.8 (L)  28 - 48 %       MONOCYTES  9.2  1 - 13 %       EOSINOPHILS  2.1  0 - 5 %       BASOPHILS  1.4  0 - 3 %       METABOLIC PANEL, BASIC          Collection Time: 11/04/18  9:00 PM         Result  Value  Ref Range            Sodium  135 (L)  136 - 145 mEq/L       Potassium  4.3  3.5 - 5.1 mEq/L  Chloride  106  98 - 107 mEq/L       CO2  22  21 - 32 mEq/L       Glucose  86  74 - 106 mg/dl       BUN  16  7 - 25 mg/dl       Creatinine  0.9  0.6 - 1.3 mg/dl       GFR est AA  >60          GFR est non-AA  >60          Calcium  8.7  8.5 - 10.1 mg/dl       Anion gap  7  5 - 15 mmol/L       MAGNESIUM          Collection Time: 11/04/18  9:00 PM         Result  Value  Ref Range            Magnesium  1.9  1.6 - 2.6 mg/dl          Labs Reviewed       CBC WITH AUTOMATED DIFF - Abnormal; Notable for the following components:            Result  Value            WBC  2.9 (*)         RBC  3.45 (*)         HGB  10.9 (*)         HCT  34.4 (*)         MCV  99.7 (*)         MPV  10.2 (*)         RDW-SD  63.1 (*)         NEUTROPHILS  67.1 (*)         LYMPHOCYTES  18.8 (*)            All other components within normal limits       METABOLIC PANEL, BASIC - Abnormal; Notable for the following components:            Sodium  135 (*)            All other components within normal limits       MAGNESIUM           Imaging:     No results found.        ED Course/Medical Decision Making     Labs show WBC count of 2.9. H/H is much improved. Platelets WNL. Creatinine is WNL. Electrolytes unremarkable.  On reexamination, patient feeling improved after pain medication. HR  has improved after pain medication as well and is currently 107 on the monitor on my reading. Patient was given prescription for pain medication and instructed to follow up with his infectious disease doctor. He was advised to elevate the legs as much  as possible.       Patient has not developed any new or worsening symptoms and has remained clinically stable throughout his course in the ED. We feel the patient is stable for discharge at this time to continue outpatient management and follow up. Patient was instructed  to return to the ER if condition worsens or new symptoms develop. The patient indicates understanding of these issues and agrees with the plan.                Medications  sodium chloride (NS) flush 5-10 mL (10 mL IntraVENous Given 11/04/18 2205)     morphine injection 6 mg (6 mg IntraVENous Given 11/04/18 2249)       oxyCODONE-acetaminophen (PERCOCET) 5-325 mg per tablet 1 Tab (1 Tab Oral Given 11/04/18 2350)             Final Diagnosis                 ICD-10-CM  ICD-9-CM          1.  Bilateral foot pain   M79.671  729.5           M79.672            2.  Rash   R21  782.1             Disposition     Discharged in stable condition.        Discharge Medication List as of 11/04/2018 11:37 PM              START taking these medications          Details        oxyCODONE-acetaminophen (Percocet) 5-325 mg per tablet  Take 1 Tab by mouth every eight (8) hours as needed for Pain for up to 5 days. Max Daily Amount: 3 Tabs., Normal, Disp-12 Tab,R-0                     CONTINUE these medications which have NOT CHANGED          Details        trimethoprim-sulfamethoxazole (BACTRIM DS, SEPTRA DS) 160-800 mg per tablet  Take 1 Tab by mouth every Monday, Wednesday, Friday., Print, Disp-30 Tab, R-1               bictegrav-emtricit-tenofov ala (BIKTARVY) tab tablet  Take 1 Tab by mouth daily., Print, Disp-30 Tab, R-1               promethazine  (PHENERGAN) 12.5 mg tablet  Take 1 Tab by mouth every six (6) hours as needed for Nausea., Print, Disp-20 Tab, R-0               albuterol (PROAIR HFA) 90 mcg/actuation inhaler  Take 1 Puff by inhalation every four (4) hours as needed for Wheezing., Print, Disp-1 Inhaler, R-0                         Patient has been evaluated by myself and Despina HickSHARKEY, CRAIG M, MD who agrees with the above assessment and plan.      Caren GriffinsKristen L Bitzer, PA-C   November 05, 2018   8:24 PM         My signature above authenticates this document and my orders, the final diagnosis(es), discharge prescription(s), and instructions in the Epic record. If you have any questions please contact [phone].       Nursing notes have been reviewed by the physician and advanced practice clinician.

## 2018-11-04 NOTE — ED Notes (Signed)
The mid-level provider and I discussed the patient's history, physical exam, differential diagnosis, and ancillary information.  I personally saw and examined the patient. I have reviewed and agree with the midlevel findings, including all diagnostic interpretations, and plans as written. I was present during the key portions of separately billed procedures.      Portions of this chart were created with Dragon medical speech to text program.   Unrecognized errors may be present.    Patient with bilateral foot pain easily palpable dorsalis pedis pulses, there is hyperpigmented discolorations at the instep of both feet, these are exquisitely tender to palpation.  There is no fluctuance there is no surrounding erythema.  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  Labs are otherwise unremarkable, he is on antiretroviral therapy, agree with pain medication follow-up with ID

## 2018-11-04 NOTE — ED Notes (Signed)
11:53 PM  11/04/18     Discharge instructions given to patient (name) with verbalization of understanding. Patient accompanied by mom.  Patient discharged with the following prescriptions percocet sent to pts pharmacy-pt notified. Patient discharged to home (destination).      Kandee Keen, RN

## 2018-11-04 NOTE — ED Notes (Signed)
C/o B feet swelling x 3 days. Stated he's hurting but feet feels numb, has bruising. Denies any injury.

## 2018-11-05 LAB — CBC WITH AUTO DIFFERENTIAL
Basophils %: 1.4 % (ref 0–3)
Eosinophils %: 2.1 % (ref 0–5)
Hematocrit: 34.4 % — ABNORMAL LOW (ref 37.0–50.0)
Hemoglobin: 10.9 gm/dl — ABNORMAL LOW (ref 12.4–17.2)
Immature Granulocytes: 1.4 % (ref 0.0–3.0)
Lymphocytes %: 18.8 % — ABNORMAL LOW (ref 28–48)
MCH: 31.6 pg (ref 23.0–34.6)
MCHC: 31.7 gm/dl (ref 30.0–36.0)
MCV: 99.7 fL — ABNORMAL HIGH (ref 80.0–98.0)
MPV: 10.2 fL — ABNORMAL HIGH (ref 6.0–10.0)
Monocytes %: 9.2 % (ref 1–13)
Neutrophils %: 67.1 % — ABNORMAL HIGH (ref 34–64)
Nucleated RBCs: 0 (ref 0–0)
Platelets: 393 10*3/uL (ref 140–450)
RBC: 3.45 M/uL — ABNORMAL LOW (ref 3.80–5.70)
RDW-SD: 63.1 — ABNORMAL HIGH (ref 35.1–43.9)
WBC: 2.9 10*3/uL — ABNORMAL LOW (ref 4.0–11.0)

## 2018-11-05 LAB — BASIC METABOLIC PANEL
Anion Gap: 7 mmol/L (ref 5–15)
BUN: 16 mg/dl (ref 7–25)
CO2: 22 mEq/L (ref 21–32)
Calcium: 8.7 mg/dl (ref 8.5–10.1)
Chloride: 106 mEq/L (ref 98–107)
Creatinine: 0.9 mg/dl (ref 0.6–1.3)
EGFR IF NonAfrican American: >60
GFR African American: >60
Glucose: 86 mg/dl (ref 74–106)
Potassium: 4.3 mEq/L (ref 3.5–5.1)
Sodium: 135 mEq/L — ABNORMAL LOW (ref 136–145)

## 2018-11-05 LAB — MAGNESIUM
Magnesium: 1.9 mg/dl (ref 1.6–2.6)
Magnesium: 1.9 mg/dl (ref 1.6–2.6)

## 2018-11-05 LAB — CBC WITH AUTOMATED DIFF
BASOPHILS: 1.4 % (ref 0–3)
EOSINOPHILS: 2.1 % (ref 0–5)
HCT: 34.4 % — ABNORMAL LOW (ref 37.0–50.0)
HGB: 10.9 gm/dl — ABNORMAL LOW (ref 12.4–17.2)
IMMATURE GRANULOCYTES: 1.4 % (ref 0.0–3.0)
LYMPHOCYTES: 18.8 % — ABNORMAL LOW (ref 28–48)
MCH: 31.6 pg (ref 23.0–34.6)
MCHC: 31.7 gm/dl (ref 30.0–36.0)
MCV: 99.7 fL — ABNORMAL HIGH (ref 80.0–98.0)
MONOCYTES: 9.2 % (ref 1–13)
MPV: 10.2 fL — ABNORMAL HIGH (ref 6.0–10.0)
NEUTROPHILS: 67.1 % — ABNORMAL HIGH (ref 34–64)
NRBC: 0 (ref 0–0)
PLATELET: 393 10*3/uL (ref 140–450)
RBC: 3.45 M/uL — ABNORMAL LOW (ref 3.80–5.70)
RDW-SD: 63.1 — ABNORMAL HIGH (ref 35.1–43.9)
WBC: 2.9 10*3/uL — ABNORMAL LOW (ref 4.0–11.0)

## 2018-11-05 LAB — METABOLIC PANEL, BASIC
Anion gap: 7 mmol/L (ref 5–15)
BUN: 16 mg/dl (ref 7–25)
CO2: 22 mEq/L (ref 21–32)
Calcium: 8.7 mg/dl (ref 8.5–10.1)
Chloride: 106 mEq/L (ref 98–107)
Creatinine: 0.9 mg/dl (ref 0.6–1.3)
GFR est AA: >60
GFR est non-AA: >60
Glucose: 86 mg/dl (ref 74–106)
Potassium: 4.3 mEq/L (ref 3.5–5.1)
Sodium: 135 mEq/L — ABNORMAL LOW (ref 136–145)

## 2018-11-05 MED ORDER — MORPHINE 4 MG/ML SYRINGE
4 mg/mL | INTRAMUSCULAR | Status: AC
Start: 2018-11-05 — End: 2018-11-04
  Administered 2018-11-05: 03:00:00 via INTRAVENOUS

## 2018-11-05 MED ORDER — OXYCODONE-ACETAMINOPHEN 5 MG-325 MG TAB
5-325 mg | ORAL | Status: AC
Start: 2018-11-05 — End: 2018-11-04
  Administered 2018-11-05: 04:00:00 via ORAL

## 2018-11-05 MED ORDER — OXYCODONE-ACETAMINOPHEN 5 MG-325 MG TAB
5-325 mg | ORAL_TABLET | Freq: Three times a day (TID) | ORAL | 0 refills | Status: AC | PRN
Start: 2018-11-05 — End: 2018-11-09

## 2018-11-05 MED FILL — MORPHINE 4 MG/ML SYRINGE: 4 mg/mL | INTRAMUSCULAR | Qty: 2

## 2018-11-05 MED FILL — OXYCODONE-ACETAMINOPHEN 5 MG-325 MG TAB: 5-325 mg | ORAL | Qty: 1

## 2018-12-12 ENCOUNTER — Other Ambulatory Visit: Payer: Self-pay

## 2018-12-12 ENCOUNTER — Encounter (HOSPITAL_COMMUNITY): Payer: Self-pay

## 2018-12-12 ENCOUNTER — Emergency Department (HOSPITAL_COMMUNITY): Payer: Medicaid Other

## 2018-12-12 ENCOUNTER — Emergency Department (HOSPITAL_COMMUNITY)
Admission: EM | Admit: 2018-12-12 | Discharge: 2018-12-12 | Disposition: A | Discharge status: Home / Self Care | Payer: Medicaid Other | Attending: Emergency Medicine | Admitting: Emergency Medicine

## 2018-12-12 DIAGNOSIS — B349 Viral infection, unspecified: Secondary | ICD-10-CM | POA: Diagnosis not present

## 2018-12-12 DIAGNOSIS — Z20822 Contact with and (suspected) exposure to covid-19: Secondary | ICD-10-CM

## 2018-12-12 DIAGNOSIS — Z21 Asymptomatic human immunodeficiency virus [HIV] infection status: Secondary | ICD-10-CM | POA: Insufficient documentation

## 2018-12-12 DIAGNOSIS — Z20828 Contact with and (suspected) exposure to other viral communicable diseases: Secondary | ICD-10-CM | POA: Diagnosis not present

## 2018-12-12 DIAGNOSIS — R42 Dizziness and giddiness: Secondary | ICD-10-CM | POA: Diagnosis present

## 2018-12-12 DIAGNOSIS — E860 Dehydration: Secondary | ICD-10-CM | POA: Insufficient documentation

## 2018-12-12 DIAGNOSIS — Z79899 Other long term (current) drug therapy: Secondary | ICD-10-CM | POA: Insufficient documentation

## 2018-12-12 HISTORY — DX: Asymptomatic human immunodeficiency virus (hiv) infection status: Z21

## 2018-12-12 LAB — CBC WITH DIFFERENTIAL/PLATELET
Abs Immature Granulocytes: 0.01 10*3/uL (ref 0.00–0.07)
Basophils Absolute: 0 10*3/uL (ref 0.0–0.1)
Basophils Relative: 1 %
Eosinophils Absolute: 0.1 10*3/uL (ref 0.0–0.5)
Eosinophils Relative: 3 %
HCT: 35.6 % — ABNORMAL LOW (ref 39.0–52.0)
Hemoglobin: 11.6 g/dL — ABNORMAL LOW (ref 13.0–17.0)
Immature Granulocytes: 0 %
Lymphocytes Relative: 31 %
Lymphs Abs: 1 10*3/uL (ref 0.7–4.0)
MCH: 31.4 pg (ref 26.0–34.0)
MCHC: 32.6 g/dL (ref 30.0–36.0)
MCV: 96.2 fL (ref 80.0–100.0)
Monocytes Absolute: 0.5 10*3/uL (ref 0.1–1.0)
Monocytes Relative: 17 %
Neutro Abs: 1.5 10*3/uL — ABNORMAL LOW (ref 1.7–7.7)
Neutrophils Relative %: 48 %
Platelets: 217 10*3/uL (ref 150–400)
RBC: 3.7 MIL/uL — ABNORMAL LOW (ref 4.22–5.81)
RDW: 13.4 % (ref 11.5–15.5)
WBC: 3.1 10*3/uL — ABNORMAL LOW (ref 4.0–10.5)
nRBC: 0 % (ref 0.0–0.2)

## 2018-12-12 LAB — URINALYSIS, ROUTINE W REFLEX MICROSCOPIC
Bilirubin Urine: NEGATIVE
Glucose, UA: NEGATIVE mg/dL
Hgb urine dipstick: NEGATIVE
Ketones, ur: NEGATIVE mg/dL
Nitrite: NEGATIVE
Protein, ur: 30 mg/dL — AB
Specific Gravity, Urine: 1.021 (ref 1.005–1.030)
pH: 5 (ref 5.0–8.0)

## 2018-12-12 LAB — COMPREHENSIVE METABOLIC PANEL
ALT: 23 U/L (ref 0–44)
AST: 20 U/L (ref 15–41)
Albumin: 3.2 g/dL — ABNORMAL LOW (ref 3.5–5.0)
Alkaline Phosphatase: 78 U/L (ref 38–126)
Anion gap: 11 (ref 5–15)
BUN: 22 mg/dL — ABNORMAL HIGH (ref 6–20)
CO2: 19 mmol/L — ABNORMAL LOW (ref 22–32)
Calcium: 8.7 mg/dL — ABNORMAL LOW (ref 8.9–10.3)
Chloride: 101 mmol/L (ref 98–111)
Creatinine, Ser: 2.05 mg/dL — ABNORMAL HIGH (ref 0.61–1.24)
GFR calc Af Amer: 49 mL/min — ABNORMAL LOW (ref >60)
GFR calc non Af Amer: 42 mL/min — ABNORMAL LOW (ref >60)
Glucose, Bld: 105 mg/dL — ABNORMAL HIGH (ref 70–99)
Potassium: 3.9 mmol/L (ref 3.5–5.1)
Sodium: 131 mmol/L — ABNORMAL LOW (ref 135–145)
Total Bilirubin: 0.6 mg/dL (ref 0.3–1.2)
Total Protein: 7.4 g/dL (ref 6.5–8.1)

## 2018-12-12 LAB — ABO/RH: ABO/RH(D): A POS

## 2018-12-12 LAB — TYPE AND SCREEN
ABO/RH(D): A POS
Antibody Screen: NEGATIVE

## 2018-12-12 LAB — POC OCCULT BLOOD, ED: Fecal Occult Bld: NEGATIVE

## 2018-12-12 MED ORDER — SODIUM CHLORIDE 0.9 % IV BOLUS
500.0000 mL | Freq: Once | INTRAVENOUS | Status: AC
  Administered 2018-12-12: 500 mL via INTRAVENOUS

## 2018-12-12 MED ORDER — SODIUM CHLORIDE 0.9 % IV BOLUS
1000.0000 mL | Freq: Once | INTRAVENOUS | Status: AC
  Administered 2018-12-12: 1000 mL via INTRAVENOUS

## 2018-12-12 NOTE — Discharge Instructions (Addendum)
You have been diagnosed today with Dehydration  At this time there does not appear to be the presence of an emergent medical condition, however there is always the potential for conditions to change. Please read and follow the below instructions.  Please return to the Emergency Department immediately for any new or worsening symptoms or if symptoms not improving in the next 2 days. Please be sure to follow up with your Primary Care Provider within one week regarding your visit today; please call their office today to schedule an appointment even if you are feeling better for a follow-up visit. Please be sure to drink plenty of water and eat healthy foods. You have been tested for the COVID-19 virus today.  Your results will be available on your MyChart account in the next 2-3 days.  Please continue self quarantining until you are symptom-free for at least 7 days.  Get help right away if: You have symptoms of very bad dehydration. You cannot drink fluids without throwing up (vomiting). Your symptoms get worse You have a fever. You have a very bad headache. You are throwing up or having watery poop (diarrhea) and it: Gets worse. Does not go away. You have blood or something green (bile) in your throw-up. You have blood in your poop (stool). This may cause poop to look black and tarry. You have not peed in 6-8 hours. You pass out (faint). Your heart rate when you are sitting still is more than 100 beats a minute. You have trouble breathing. You have any new/concerning or worsening of symptoms  Please read the additional information packets attached to your discharge summary.  Do not take your medicine if  develop an itchy rash, swelling in your mouth or lips, or difficulty breathing; call 911 and seek immediate emergency medical attention if this occurs.  Note: Portions of this text may have been transcribed using voice recognition software. Every effort was made to ensure accuracy;  however, inadvertent computerized transcription errors may still be present.

## 2018-12-12 NOTE — ED Triage Notes (Signed)
Pt here after 2 days of dizzness, was diagnosed with a GI bleed on 10/3 at [facility] in [place], specialist appointment scheduled but has not had follow up yet.  No n/v/d, SOB, cough, or fever.

## 2018-12-12 NOTE — ED Triage Notes (Signed)
Pt also complains of headache with blurred vision

## 2018-12-12 NOTE — ED Provider Notes (Signed)
[facility] Provider Note   CSN: [id number] Arrival date & time: 12/12/18  1206     History   Chief Complaint Chief Complaint  Patient presents with  . Dizziness    HPI Gregor HamsWalter Lesesne is a 28 y.o. male HIV positive who reports compliance with his medications presents today for generalized weakness, fatigue, decreased po intake, headache, intermittent blurred vision and lightheadedness x2 days.  Symptoms gradual in onset worsening no clear aggravating or alleviating factors.  Reports mild generalized headache constant nonradiating without aggravating or alleviating factors.  Patient seen in ER in [place] on 12/04/2018 for blood in the stool and urine, chart review shows patient with Hemoccult positive stool at that time thought by ED provider to be secondary to condylomata of the anus.  Patient denies fever/chills, confusion, speech difficulty, neck pain/stiffness, chest pain/shortness of breath, vomiting/diarrhea, rash, numbness/tingling, weakness or any additional concerns.  Of note patient reports history of Bell's palsy affecting his right face, denies recent change.     HPI  Past Medical History:  Diagnosis Date  . HIV positive (HCC)     There are no active problems to display for this patient.        Home Medications    Prior to Admission medications   Medication Sig Start Date End Date Taking? Authorizing Provider  albuterol (VENTOLIN HFA) 108 (90 Base) MCG/ACT inhaler Inhale 1.2 puffs into the lungs every 6 (six) hours as needed for wheezing. 01/27/16  Yes [provider]  BIKTARVY 50-200-25 MG TABS tablet Take 1 tablet by mouth daily. 09/03/18  Yes [provider]  Biotin 5000 MCG TABS Take 5,000 mcg by mouth daily.   Yes [provider]  clotrimazole (MYCELEX) 10 MG troche Take 10 mg by mouth 3 (three) times daily.   Yes [provider]  gabapentin (NEURONTIN) 300 MG capsule Take 300 mg  by mouth 3 (three) times daily. 12/09/18  Yes [provider]  lisinopril (ZESTRIL) 10 MG tablet Take 10 mg by mouth daily. 11/27/18  Yes [provider]  loperamide (IMODIUM) 2 MG capsule Take 2 mg by mouth 4 (four) times daily as needed for diarrhea or loose stools.   Yes [provider]  promethazine (PHENERGAN) 12.5 MG tablet Take 12.5 mg by mouth every 6 (six) hours as needed for allergies. 08/26/18  Yes [provider]  sulfamethoxazole-trimethoprim (BACTRIM DS) 800-160 MG tablet Take 1 tablet by mouth daily. 12/08/18  Yes [provider]    Family History No family history on file.  Social History Social History   Tobacco Use  . Smoking status: Not on file  Substance Use Topics  . Alcohol use: Not on file  . Drug use: Not on file     Allergies   Patient has no known allergies.   Review of Systems Review of Systems Ten systems are reviewed and are negative for acute change except as noted in the HPI   Physical Exam Updated Vital Signs BP 113/81   Pulse 94   Temp 98.4 F (36.9 C)   Resp 15   SpO2 100% Comment: Pt in bed resting, verified data off of moniitor; Sp02 100% on RA; no s/s of distress.   Physical Exam Constitutional:      General: He is not in acute distress.    Appearance: Normal appearance. He is well-developed. He is not ill-appearing or diaphoretic.  HENT:     Head: Normocephalic and atraumatic.  Jaw: There is normal jaw occlusion. No trismus.     Right Ear: Tympanic membrane, ear canal and external ear normal.     Left Ear: Tympanic membrane, ear canal and external ear normal.     Ears:     Comments: No vesicles    Nose: Nose normal.     Mouth/Throat:     Mouth: Mucous membranes are moist.     Pharynx: Oropharynx is clear.  Eyes:     General: Vision grossly intact. Gaze aligned appropriately.     Conjunctiva/sclera: Conjunctivae normal.     Pupils: Pupils are equal, round, and reactive to light.   Neck:     Musculoskeletal: Full passive range of motion without pain, normal range of motion and neck supple.     Trachea: Trachea and phonation normal. No tracheal deviation.     Meningeal: Brudzinski's sign absent.  Cardiovascular:     Rate and Rhythm: Normal rate and regular rhythm.     Pulses:          Dorsalis pedis pulses are 2+ on the right side and 2+ on the left side.     Heart sounds: Normal heart sounds.  Pulmonary:     Effort: Pulmonary effort is normal. No respiratory distress.     Breath sounds: Normal breath sounds and air entry.  Chest:     Chest wall: No tenderness.  Abdominal:     General: There is no distension.     Palpations: Abdomen is soft.     Tenderness: There is no abdominal tenderness. There is no guarding or rebound.  Musculoskeletal: Normal range of motion.     Right lower leg: Normal.     Left lower leg: Normal.  Feet:     Right foot:     Protective Sensation: 3 sites tested. 3 sites sensed.     Left foot:     Protective Sensation: 3 sites tested. 3 sites sensed.  Skin:    General: Skin is warm and dry.  Neurological:     Mental Status: He is alert.     GCS: GCS eye subscore is 4. GCS verbal subscore is 5. GCS motor subscore is 6.     Comments: Mental Status: Alert, oriented, thought content appropriate, able to give a coherent history. Speech fluent without evidence of aphasia. Able to follow 2 step commands without difficulty. Cranial Nerves: II: Peripheral visual fields grossly normal, pupils equal, round, reactive to light III,IV, VI: ptosis not present, extra-ocular motions intact bilaterally V,VII: Mild right facial droop including forehead/eyebrows, sensation intact and equal.  VIII: hearing grossly normal to voice X: uvula elevates symmetrically XI: bilateral shoulder shrug symmetric and strong XII: midline tongue extension without fassiculations Motor: Normal tone. 5/5 strength in upper and lower extremities bilaterally  including strong and equal grip strength and dorsiflexion/plantar flexion Sensory: Sensation intact to light touch in all extremities. Cerebellar: normal finger-to-nose with bilateral upper extremities. Normal heel-to -shin balance bilaterally of the lower extremity. No pronator drift.  CV: distal pulses palpable throughout  Psychiatric:        Behavior: Behavior normal.    ED Treatments / Results  Labs (all labs ordered are listed, but only abnormal results are displayed) Labs Reviewed  CBC WITH DIFFERENTIAL/PLATELET - Abnormal; Notable for the following components:      Result Value   WBC 3.1 (*)    RBC 3.70 (*)    Hemoglobin 11.6 (*)    HCT 35.6 (*)    Neutro  Abs 1.5 (*)    All other components within normal limits  COMPREHENSIVE METABOLIC PANEL - Abnormal; Notable for the following components:   Sodium 131 (*)    CO2 19 (*)    Glucose, Bld 105 (*)    BUN 22 (*)    Creatinine, Ser 2.05 (*)    Calcium 8.7 (*)    Albumin 3.2 (*)    GFR calc non Af Amer 42 (*)    GFR calc Af Amer 49 (*)    All other components within normal limits  URINALYSIS, ROUTINE W REFLEX MICROSCOPIC - Abnormal; Notable for the following components:   APPearance HAZY (*)    Protein, ur 30 (*)    Leukocytes,Ua SMALL (*)    Bacteria, UA RARE (*)    All other components within normal limits  URINE CULTURE  NOVEL CORONAVIRUS, NAA (HOSP ORDER, SEND-OUT TO REF LAB; TAT 18-24 HRS)  POC OCCULT BLOOD, ED  TYPE AND SCREEN  ABO/RH    EKG EKG Interpretation  Date/Time:  Saturday December 12 2018 12:36:36 EDT Ventricular Rate:  102 PR Interval:    QRS Duration: 71 QT Interval:  324 QTC Calculation: 422 R Axis:   64 Text Interpretation:  Sinus tachycardia Confirmed by Raeford Razor [phone]) on 12/12/2018 12:48:35 PM   Radiology Dg Chest Portable 1 View  Result Date: 12/12/2018 CLINICAL DATA:  28 year old male with history of 2 days of dizziness. Recent history of GI bleed. EXAM: PORTABLE CHEST 1  VIEW COMPARISON:  No priors. FINDINGS: Lung volumes are normal. No consolidative airspace disease. No pleural effusions. No pneumothorax. No pulmonary nodule or mass noted. Pulmonary vasculature and the cardiomediastinal silhouette are within normal limits. IMPRESSION: No radiographic evidence of acute cardiopulmonary disease. Electronically Signed   By: Trudie Reed M.D.   On: 12/12/2018 13:25    Procedures Procedures (including critical care time)  Medications Ordered in ED Medications  sodium chloride 0.9 % bolus 1,000 mL (0 mLs Intravenous Stopped 12/12/18 1629)  sodium chloride 0.9 % bolus 500 mL (0 mLs Intravenous Stopped 12/12/18 1630)     Initial Impression / Assessment and Plan / ED Course  I have reviewed the triage vital signs and the nursing notes.  Pertinent labs & imaging results that were available during my care of the patient were reviewed by me and considered in my medical decision making (see chart for details).     Review of care everywhere shows patient's most recent chemistry panel from 12/04/2018 creatinine 1.0.  Hemoglobin 11.1  11/16/2018, CD4 absolute 60 -------------------- 28 year old male HIV positive presents today with a 2-day history of viral-like illness, appears dehydrated on exam.  He has right facial droop involving the forehead and eyebrow as well consistent with history of Bell's palsy, no other neuro deficits.  Patient reports as unchanged, doubt acute CNS process at this time. Tachycardic without murmur, clear lungs, soft abdomen without tenderness or peritoneal signs, neurovascular intact to all 4 extremities.  Orthostatic on exam.  Normal range of motion of the neck without pain. --------------- --------------- Hemoccult negative, collected by RN CBC with hemoglobin 11.6, WBCs 3.1 CMP with creatinine 2.05, BUN 22, sodium 131 Type and screen A+, antibody negative Urinalysis shows small leukocytes, 6-10 WBCs, rare bacteria, nitrite negative.   Patient without urinary symptoms doubt UTI at this time but will send for culture.  No indication for antibiotics at this time.  Chest x-ray:    IMPRESSION:  No radiographic evidence of acute cardiopulmonary disease.   EKG:  Sinus tachycardia Confirmed by Virgel Manifold [phone]) on 12/12/2018 12:48:35 PM -------------------- Patient appears to have AKI today, suspect secondary to dehydration and decreased PO intake, will give fluid bolus. - 2:35 PM: Patient reevaluated resting comfortably and in no acute distress states improvement of symptoms following 1 L fluid bolus today.  Tachycardia improved.  Will give additional fluids and reassess. - Patient reassessed resting comfortably no acute distress tolerating p.o.  Mother at bedside.  Vital signs afebrile, heart rate 80 bpm, respiratory rate 16, blood pressure 113/81, SPO2 100% on room air.  Patient well-appearing.  Suspect patient's symptoms today secondary to dehydration and viral illness.  Will test patient as outpatient for COVID-19 virus.  Patient informed to check his MyChart account in the next 2-3 days for results.  Normal range of motion of the neck, doubt meningitis at this time.  Abdomen soft nontender without peritoneal signs doubt acute abdominal process.  Patient is overall well-appearing and in no acute distress. There is no indication for admission or further work-up/imaging at this time.  He is been encouraged to increase his water intake, rest and OTC anti-inflammatories as needed.  At this time there does not appear to be any evidence of an acute emergency medical condition and the patient appears stable for discharge with appropriate outpatient follow up. Diagnosis was discussed with patient who verbalizes understanding of care plan and is agreeable to discharge. I have discussed return precautions with patient and mother who verbalizes understanding of return precautions. Patient encouraged to follow-up with their PCP. All  questions answered.   Patient seen and evaluated by Dr. Wilson Singer during this visit who agrees with discharge and outpatient follow-up at this time.  Terryon Pineiro was evaluated in Emergency Department on 12/12/2018 for the symptoms described in the history of present illness. He was evaluated in the context of the global COVID-19 pandemic, which necessitated consideration that the patient might be at risk for infection with the SARS-CoV-2 virus that causes COVID-19. Institutional protocols and algorithms that pertain to the evaluation of patients at risk for COVID-19 are in a state of rapid change based on information released by regulatory bodies including the CDC and federal and state organizations. These policies and algorithms were followed during the patient's care in the ED.  Note: Portions of this report may have been transcribed using voice recognition software. Every effort was made to ensure accuracy; however, inadvertent computerized transcription errors may still be present. Final Clinical Impressions(s) / ED Diagnoses   Final diagnoses:  Dehydration  Viral illness  Suspected COVID-19 virus infection    ED Discharge Orders    None       Gari Crown 12/12/18 1632    Virgel Manifold, MD 12/12/18 1845

## 2018-12-12 NOTE — ED Notes (Signed)
When standing up pt complains of weakness during orthostatic vitals

## 2018-12-13 LAB — NOVEL CORONAVIRUS, NAA (HOSP ORDER, SEND-OUT TO REF LAB; TAT 18-24 HRS): SARS-CoV-2, NAA: NOT DETECTED

## 2018-12-13 LAB — URINE CULTURE: Culture: <10000 — AB

## 2019-06-08 ENCOUNTER — Other Ambulatory Visit: Payer: Self-pay | Admitting: *Deleted

## 2019-06-08 DIAGNOSIS — Z79899 Other long term (current) drug therapy: Secondary | ICD-10-CM

## 2019-06-08 DIAGNOSIS — Z113 Encounter for screening for infections with a predominantly sexual mode of transmission: Secondary | ICD-10-CM

## 2019-06-08 DIAGNOSIS — B20 Human immunodeficiency virus [HIV] disease: Secondary | ICD-10-CM

## 2019-06-10 ENCOUNTER — Ambulatory Visit: Payer: Managed Care, Other (non HMO)

## 2019-06-10 ENCOUNTER — Other Ambulatory Visit: Payer: Managed Care, Other (non HMO)

## 2019-06-11 ENCOUNTER — Ambulatory Visit: Payer: Managed Care, Other (non HMO)

## 2019-06-11 ENCOUNTER — Other Ambulatory Visit: Payer: Managed Care, Other (non HMO)

## 2019-06-22 ENCOUNTER — Ambulatory Visit: Payer: Medicaid Other

## 2019-06-22 ENCOUNTER — Other Ambulatory Visit (HOSPITAL_COMMUNITY)
Admission: RE | Admit: 2019-06-22 | Discharge: 2019-06-22 | Disposition: A | Discharge status: Home / Self Care | Payer: Medicaid Other | Source: Ambulatory Visit | Attending: Infectious Diseases | Admitting: Infectious Diseases

## 2019-06-22 ENCOUNTER — Encounter: Payer: Self-pay | Admitting: Infectious Diseases

## 2019-06-22 ENCOUNTER — Other Ambulatory Visit: Payer: Self-pay

## 2019-06-22 ENCOUNTER — Other Ambulatory Visit: Payer: Medicaid Other

## 2019-06-22 DIAGNOSIS — Z113 Encounter for screening for infections with a predominantly sexual mode of transmission: Secondary | ICD-10-CM

## 2019-06-22 DIAGNOSIS — B20 Human immunodeficiency virus [HIV] disease: Secondary | ICD-10-CM

## 2019-06-22 DIAGNOSIS — Z79899 Other long term (current) drug therapy: Secondary | ICD-10-CM

## 2019-06-23 LAB — URINALYSIS
Bilirubin Urine: NEGATIVE
Glucose, UA: NEGATIVE
Hgb urine dipstick: NEGATIVE
Ketones, ur: NEGATIVE
Leukocytes,Ua: NEGATIVE
Nitrite: NEGATIVE
Protein, ur: NEGATIVE
Specific Gravity, Urine: 1.029 (ref 1.001–1.03)
pH: 5.5 (ref 5.0–8.0)

## 2019-06-23 LAB — URINE CYTOLOGY ANCILLARY ONLY
Chlamydia: NEGATIVE
Comment: NEGATIVE
Comment: NORMAL
Neisseria Gonorrhea: NEGATIVE

## 2019-06-23 LAB — T-HELPER CELL (CD4) - (RCID CLINIC ONLY)
CD4 % Helper T Cell: 15 % — ABNORMAL LOW (ref 33–65)
CD4 T Cell Abs: 170 /uL — ABNORMAL LOW (ref 400–1790)

## 2019-06-25 ENCOUNTER — Telehealth: Payer: Self-pay

## 2019-06-25 NOTE — Telephone Encounter (Signed)
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   Thank you

## 2019-06-25 NOTE — Telephone Encounter (Signed)
Called patient to follow up on recent RPR and if he received treatment. Spoke with pt who states he was tested/treated in March. Received injection x1 with them. Left voicemail with [facility] Dept. To confirm treatment was given also to fax results. Jimmy Fitzgerald, [place]

## 2019-06-25 NOTE — Progress Notes (Signed)
Patient called regarding treatment for Syphillis. States he was treat at [facility]. In March. Left vm with Health Dept. To call office back. Lorenso Courier, [place]

## 2019-06-26 LAB — COMPLETE METABOLIC PANEL WITH GFR
AG Ratio: 1.5 (calc) (ref 1.0–2.5)
ALT: 19 U/L (ref 9–46)
AST: 15 U/L (ref 10–40)
Albumin: 4.1 g/dL (ref 3.6–5.1)
Alkaline phosphatase (APISO): 54 U/L (ref 36–130)
BUN: 15 mg/dL (ref 7–25)
CO2: 25 mmol/L (ref 20–32)
Calcium: 9.5 mg/dL (ref 8.6–10.3)
Chloride: 108 mmol/L (ref 98–110)
Creat: 0.99 mg/dL (ref 0.60–1.35)
GFR, Est African American: 118 mL/min/{1.73_m2} (ref >=60)
GFR, Est Non African American: 102 mL/min/{1.73_m2} (ref >=60)
Globulin: 2.8 g/dL (calc) (ref 1.9–3.7)
Glucose, Bld: 103 mg/dL — ABNORMAL HIGH (ref 65–99)
Potassium: 4.5 mmol/L (ref 3.5–5.3)
Sodium: 139 mmol/L (ref 135–146)
Total Bilirubin: 0.4 mg/dL (ref 0.2–1.2)
Total Protein: 6.9 g/dL (ref 6.1–8.1)

## 2019-06-26 LAB — CBC WITH DIFFERENTIAL/PLATELET
Absolute Monocytes: 351 cells/uL (ref 200–950)
Basophils Absolute: 30 cells/uL (ref 0–200)
Basophils Relative: 1 %
Eosinophils Absolute: 90 cells/uL (ref 15–500)
Eosinophils Relative: 3 %
HCT: 44.5 % (ref 38.5–50.0)
Hemoglobin: 14.6 g/dL (ref 13.2–17.1)
Lymphs Abs: 1227 cells/uL (ref 850–3900)
MCH: 30.7 pg (ref 27.0–33.0)
MCHC: 32.8 g/dL (ref 32.0–36.0)
MCV: 93.7 fL (ref 80.0–100.0)
MPV: 12.7 fL — ABNORMAL HIGH (ref 7.5–12.5)
Monocytes Relative: 11.7 %
Neutro Abs: 1302 cells/uL — ABNORMAL LOW (ref 1500–7800)
Neutrophils Relative %: 43.4 %
Platelets: 152 10*3/uL (ref 140–400)
RBC: 4.75 10*6/uL (ref 4.20–5.80)
RDW: 12.1 % (ref 11.0–15.0)
Total Lymphocyte: 40.9 %
WBC: 3 10*3/uL — ABNORMAL LOW (ref 3.8–10.8)

## 2019-06-26 LAB — LIPID PANEL
Cholesterol: 141 mg/dL (ref <200)
HDL: 53 mg/dL (ref >=40)
LDL Cholesterol (Calc): 75 mg/dL (calc)
Non-HDL Cholesterol (Calc): 88 mg/dL (calc) (ref <130)
Total CHOL/HDL Ratio: 2.7 (calc) (ref <5.0)
Triglycerides: 58 mg/dL (ref <150)

## 2019-06-26 LAB — HEPATITIS A ANTIBODY, TOTAL: Hepatitis A AB,Total: NONREACTIVE

## 2019-06-26 LAB — QUANTIFERON-TB GOLD PLUS
Mitogen-NIL: >10 IU/mL
NIL: 0.06 IU/mL
QuantiFERON-TB Gold Plus: NEGATIVE
TB1-NIL: 0 IU/mL
TB2-NIL: 0 IU/mL

## 2019-06-26 LAB — HIV-1 RNA ULTRAQUANT REFLEX TO GENTYP+
HIV 1 RNA Quant: <20 copies/mL
HIV-1 RNA Quant, Log: <1.3 Log copies/mL

## 2019-06-26 LAB — HEPATITIS B SURFACE ANTIGEN: Hepatitis B Surface Ag: NONREACTIVE

## 2019-06-26 LAB — HEPATITIS B CORE ANTIBODY, TOTAL: Hep B Core Total Ab: NONREACTIVE

## 2019-06-26 LAB — RPR: RPR Ser Ql: REACTIVE — AB

## 2019-06-26 LAB — HEPATITIS C ANTIBODY
Hepatitis C Ab: NONREACTIVE
SIGNAL TO CUT-OFF: 0.14 (ref <1.00)

## 2019-06-26 LAB — HIV-1/2 AB - DIFFERENTIATION
HIV-1 antibody: POSITIVE — AB
HIV-2 Ab: NEGATIVE

## 2019-06-26 LAB — HLA B*5701: HLA-B*5701 w/rflx HLA-B High: NEGATIVE

## 2019-06-26 LAB — RPR TITER: RPR Titer: 1:16 {titer} — ABNORMAL HIGH

## 2019-06-26 LAB — FLUORESCENT TREPONEMAL AB(FTA)-IGG-BLD: Fluorescent Treponemal ABS: REACTIVE — AB

## 2019-06-26 LAB — HEPATITIS B SURFACE ANTIBODY,QUALITATIVE: Hep B S Ab: NONREACTIVE

## 2019-06-26 LAB — HIV ANTIBODY (ROUTINE TESTING W REFLEX): HIV 1&2 Ab, 4th Generation: REACTIVE — AB

## 2019-07-01 ENCOUNTER — Ambulatory Visit: Payer: Managed Care, Other (non HMO) | Admitting: Pharmacist

## 2019-07-01 ENCOUNTER — Encounter: Payer: Managed Care, Other (non HMO) | Admitting: Infectious Diseases

## 2019-07-06 ENCOUNTER — Ambulatory Visit: Payer: Managed Care, Other (non HMO) | Admitting: Pharmacist

## 2019-07-06 ENCOUNTER — Ambulatory Visit: Payer: Medicaid Other | Admitting: Infectious Diseases

## 2019-07-06 DIAGNOSIS — Z8619 Personal history of other infectious and parasitic diseases: Secondary | ICD-10-CM | POA: Insufficient documentation

## 2019-07-06 DIAGNOSIS — D649 Anemia, unspecified: Secondary | ICD-10-CM | POA: Insufficient documentation

## 2019-07-06 DIAGNOSIS — I10 Essential (primary) hypertension: Secondary | ICD-10-CM | POA: Insufficient documentation

## 2019-07-06 DIAGNOSIS — G629 Polyneuropathy, unspecified: Secondary | ICD-10-CM | POA: Insufficient documentation

## 2019-07-06 NOTE — Progress Notes (Deleted)
Name: Jimmy Fitzgerald  DOB: [birth year] MRN: [id number] PCP: Patient, No Pcp Per    There are no problems to display for this patient.    Brief Narrative:  Jimmy Fitzgerald is a 29 y.o. male with well controlled HIV, Dx 08-2018. Transferred are from [place]. 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   HIV Risk: MSM History of OIs: cryptosporidiosis, esophageal candidiasis  Intake Labs 05-2019: Hep B sAg (***), sAb (***), cAb (***); Hep A (***), Hep C (***) Quantiferon (***) HLA B*5701 (***) G6PD: (***)   Previous Regimens: . Biktarvy 08-2018  Genotypes: . ***  Subjective:  No chief complaint on file.    HPI: ***    LOV 01-2019 with [facility] in [place] - he was diagnosed with cryptosporidiosis and esophageal candidiasis in the past with CD4 count nadir 60. His diarrhea has since resolved.  Labs in December 2020 reveal VL 380 copies and CD4 166. RPR titer at that time 1:16 (tx titer 1:128 June 2020).    No flowsheet data found.  Health Maintenance = Receives annual preventative care through {his/her} PCP and is up to date on all recommended screenings and vaccinations. {He/She} denies cigarette use, excessive alcohol use and other substance abuse. {He/She} is seeing a dentist regularly and no dental complaints today.   ROS  Past Medical History:  Diagnosis Date  . HIV positive ([place])     Outpatient Medications Prior to Visit  Medication Sig Dispense Refill  . albuterol (VENTOLIN HFA) 108 (90 Base) MCG/ACT inhaler Inhale 1.2 puffs into the lungs every 6 (six) hours as needed for wheezing.    Marland Kitchen BIKTARVY 50-200-25 MG TABS tablet Take 1 tablet by mouth daily.    . Biotin 5000 MCG TABS Take 5,000 mcg by mouth daily.    . clotrimazole (MYCELEX) 10 MG troche Take 10 mg by mouth 3 (three) times daily.    Marland Kitchen gabapentin (NEURONTIN) 300 MG capsule Take 300 mg by mouth 3 (three) times daily.    Marland Kitchen lisinopril (ZESTRIL) 10 MG tablet Take 10 mg by mouth daily.    Marland Kitchen  loperamide (IMODIUM) 2 MG capsule Take 2 mg by mouth 4 (four) times daily as needed for diarrhea or loose stools.    . promethazine (PHENERGAN) 12.5 MG tablet Take 12.5 mg by mouth every 6 (six) hours as needed for allergies.    Marland Kitchen sulfamethoxazole-trimethoprim (BACTRIM DS) 800-160 MG tablet Take 1 tablet by mouth daily.     No facility-administered medications prior to visit.     No Known Allergies  Social History   Tobacco Use  . Smoking status: Not on file  Substance Use Topics  . Alcohol use: Not on file  . Drug use: Not on file    No family history on file.  Social History   Substance and Sexual Activity  Sexual Activity Not on file     Objective:  There were no vitals filed for this visit. There is no height or weight on file to calculate BMI.  Physical Exam  Lab Results Lab Results  Component Value Date   WBC 3.0 (L) 06/22/2019   HGB 14.6 06/22/2019   HCT 44.5 06/22/2019   MCV 93.7 06/22/2019   PLT 152 06/22/2019    Lab Results  Component Value Date   CREATININE 0.99 06/22/2019   BUN 15 06/22/2019   NA 139 06/22/2019   K 4.5 06/22/2019   CL 108 06/22/2019   CO2 25 06/22/2019  Lab Results  Component Value Date   ALT 19 06/22/2019   AST 15 06/22/2019   ALKPHOS 78 12/12/2018   BILITOT 0.4 06/22/2019    Lab Results  Component Value Date   CHOL 141 06/22/2019   HDL 53 06/22/2019   LDLCALC 75 06/22/2019   TRIG 58 06/22/2019   CHOLHDL 2.7 06/22/2019   HIV 1 RNA Quant (copies/mL)  Date Value  06/22/2019 <20 DETECTED   CD4 T Cell Abs (/uL)  Date Value  06/22/2019 170 (L)     Assessment & Plan:   Problem List Items Addressed This Visit    None      Janene Madeira, MSN, NP-C [place] [place] Pager: [phone] Office: [phone]  07/06/19  1:54 PM

## 2019-07-28 ENCOUNTER — Ambulatory Visit: Payer: Managed Care, Other (non HMO) | Admitting: Pharmacist

## 2019-07-28 ENCOUNTER — Ambulatory Visit: Payer: Managed Care, Other (non HMO) | Admitting: Internal Medicine

## 2019-08-09 ENCOUNTER — Ambulatory Visit: Payer: Managed Care, Other (non HMO) | Admitting: Internal Medicine

## 2019-08-09 ENCOUNTER — Ambulatory Visit: Payer: Managed Care, Other (non HMO) | Admitting: Pharmacist

## 2019-08-18 ENCOUNTER — Ambulatory Visit (INDEPENDENT_AMBULATORY_CARE_PROVIDER_SITE_OTHER): Payer: Self-pay | Admitting: Pharmacist

## 2019-08-18 ENCOUNTER — Encounter: Payer: Self-pay | Admitting: Internal Medicine

## 2019-08-18 ENCOUNTER — Ambulatory Visit (INDEPENDENT_AMBULATORY_CARE_PROVIDER_SITE_OTHER): Payer: Self-pay | Admitting: Internal Medicine

## 2019-08-18 ENCOUNTER — Other Ambulatory Visit: Payer: Self-pay

## 2019-08-18 VITALS — BP 152/98 | HR 72 | Temp 98.3°F | Wt 193.6 lb

## 2019-08-18 DIAGNOSIS — Z23 Encounter for immunization: Secondary | ICD-10-CM

## 2019-08-18 DIAGNOSIS — Z113 Encounter for screening for infections with a predominantly sexual mode of transmission: Secondary | ICD-10-CM | POA: Insufficient documentation

## 2019-08-18 DIAGNOSIS — B20 Human immunodeficiency virus [HIV] disease: Secondary | ICD-10-CM

## 2019-08-18 DIAGNOSIS — Z21 Asymptomatic human immunodeficiency virus [HIV] infection status: Secondary | ICD-10-CM

## 2019-08-18 MED ORDER — BIKTARVY 50-200-25 MG PO TABS: 1.0000 | ORAL_TABLET | Freq: Every day | ORAL | 11 refills | Status: DC

## 2019-08-18 MED ORDER — SULFAMETHOXAZOLE-TRIMETHOPRIM 800-160 MG PO TABS: 1.0000 | ORAL_TABLET | Freq: Every day | ORAL | 3 refills | Status: DC

## 2019-08-18 NOTE — Progress Notes (Signed)
Patient ID: Jimmy Fitzgerald, male    DOB: 1990/04/09, 29 y.o.   MRN: [id number]  Reason for visit: to establish care as a new patient with HIV  HPI:   Patient was first diagnosed in June 2020 and started on Biktarvy in July 2020.  He was tested as part of routine screening.  The CD4 count is 170 with a CD4 nadir of 12, viral load now < 20.  There have been no associated symptoms.  He is tolerating Biktarvy with no GI issues.  He has noted some BRBPR but no pain or itch.  He is interested in counseling.   Past Medical History:  Diagnosis Date  . HIV positive ([place])     Prior to Admission medications   Medication Sig Start Date End Date Taking? Authorizing Provider  albuterol (VENTOLIN HFA) 108 (90 Base) MCG/ACT inhaler Inhale 1.2 puffs into the lungs every 6 (six) hours as needed for wheezing. 01/27/16  Yes [provider]  BIKTARVY 50-200-25 MG TABS tablet Take 1 tablet by mouth daily. 09/03/18  Yes [provider]  Biotin 5000 MCG TABS Take 5,000 mcg by mouth daily. Patient not taking: Reported on 08/18/2019    [provider]  clotrimazole (MYCELEX) 10 MG troche Take 10 mg by mouth 3 (three) times daily. Patient not taking: Reported on 08/18/2019    [provider]  gabapentin (NEURONTIN) 300 MG capsule Take 300 mg by mouth 3 (three) times daily. Patient not taking: Reported on 08/18/2019 12/09/18   [provider]  lisinopril (ZESTRIL) 10 MG tablet Take 10 mg by mouth daily. Patient not taking: Reported on 08/18/2019 11/27/18   [provider]  loperamide (IMODIUM) 2 MG capsule Take 2 mg by mouth 4 (four) times daily as needed for diarrhea or loose stools. Patient not taking: Reported on 08/18/2019    [provider]  promethazine (PHENERGAN) 12.5 MG tablet Take 12.5 mg by mouth every 6 (six) hours as needed for allergies. Patient not taking: Reported on 08/18/2019 08/26/18   [provider]  sulfamethoxazole-trimethoprim (BACTRIM  DS) 800-160 MG tablet Take 1 tablet by mouth daily. Patient not taking: Reported on 08/18/2019 12/08/18   [provider]    No Known Allergies  Social History   Tobacco Use  . Smoking status: Not on file  Substance Use Topics  . Alcohol use: Not on file  . Drug use: Not on file   [facility] DM in maternal and paternal GPs  Review of Systems Constitutional: negative for fatigue and malaise Gastrointestinal: negative for nausea, melena, diarrhea and abdominal pain Integument/breast: negative for rash All other systems reviewed and are negative    CONSTITUTIONAL:in no apparent distress  Vitals:   08/18/19 0841  BP: (!) 152/98  Pulse: 72  Temp: 98.3 F (36.8 C)  SpO2: 100%   EYES: anicteric HENT: no thrush CARD:Cor RRR RESP:clear LY:YTKPT sounds are normal, liver is not enlarged, spleen is not enlarged MS:no pedal edema noted SKIN: no rash NEURO: non-focal  Lab Results  Component Value Date   HIV1RNAQUANT <20 DETECTED 06/22/2019   No components found for: HIV1GENOTYPRPLUS No components found for: THELPERCELL  Assessment: new patient here with established HIV.  Discussed with patient treatment options and side effects, benefits of treatment, long term outcomes.  I discussed the severity of untreated HIV including higher cancer risk, opportunistic infections, renal failure.  Also discussed needing to use condoms, partner disclosure, necessary vaccines, blood monitoring.  All questions answered.    Plan: 1)  continue Biktarvy 2) restart Bactrim for OI prophylaxis 3) STI screening rtc 3 months

## 2019-08-18 NOTE — Progress Notes (Signed)
HPI: Jimmy Fitzgerald is a 29 y.o. male who presents to the [place] clinic for HIV follow-up.  Patient Active Problem List   Diagnosis Date Noted  . Routine screening for STI (sexually transmitted infection) 08/18/2019  . Peripheral neuropathy 07/06/2019  . History of syphilis 07/06/2019  . Hypertension 07/06/2019  . Anemia 07/06/2019  . Diarrhea 08/19/2018  . HIV (human immunodeficiency virus infection) ([place]) 08/19/2018  . 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   BIKTARVY 50-200-25 MG TABS TABLET    Take 1 tablet by mouth daily.   BIOTIN 5000 MCG TABS    Take 5,000 mcg by mouth daily.   CLOTRIMAZOLE (MYCELEX) 10 MG TROCHE    Take 10 mg by mouth 3 (three) times daily.   GABAPENTIN (NEURONTIN) 300 MG CAPSULE    Take 300 mg by mouth 3 (three) times daily.   LISINOPRIL (ZESTRIL) 10 MG TABLET    Take 10 mg by mouth daily.   LOPERAMIDE (IMODIUM) 2 MG CAPSULE    Take 2 mg by mouth 4 (four) times daily as needed for diarrhea or loose stools.   PROMETHAZINE (PHENERGAN) 12.5 MG TABLET    Take 12.5 mg by mouth every 6 (six) hours as needed for allergies.   SULFAMETHOXAZOLE-TRIMETHOPRIM (BACTRIM DS) 800-160 MG TABLET    Take 1 tablet by mouth daily.  Modified Medications   No medications on file  Discontinued Medications   No medications on file    Allergies: No Known Allergies  Past Medical History: Past Medical History:  Diagnosis Date  . HIV positive ([place])     Social History: Social History   Socioeconomic History  . Marital status: Single    Spouse name: Not on file  . Number of children: Not on file  . Years of education: Not on file  . Highest education level: Not on file  Occupational History  . Not on file  Tobacco Use  . Smoking status: Not on  file  Substance and Sexual Activity  . Alcohol use: Not on file  . Drug use: Not on file  . Sexual activity: Not on file  Other Topics Concern  . Not on file  Social History Narrative  . Not on file   Social Determinants of Health   Financial Resource Strain:   . Difficulty of Paying Living Expenses:   Food Insecurity:   . Worried About Charity fundraiser in the Last Year:   . Arboriculturist in the Last Year:   Transportation Needs:   . Film/video editor (Medical):   Marland Kitchen Lack of Transportation (Non-Medical):   Physical Activity:   . Days of Exercise per Week:   . Minutes of Exercise per Session:   Stress:   . Feeling of Stress :   Social Connections:   . Frequency of Communication with Friends and Family:   . Frequency of Social Gatherings with Friends and Family:   . Attends Religious Services:   . Active Member of Clubs or Organizations:   . Attends Archivist Meetings:   Marland Kitchen Marital Status:     Labs: Lab Results  Component Value Date   HIV1RNAQUANT <20 DETECTED 06/22/2019   CD4TABS 170 (L) 06/22/2019    RPR and STI  Lab Results  Component Value Date   LABRPR REACTIVE (A) 06/22/2019   RPRTITER 1:16 (H) 06/22/2019    STI Results GC CT  06/22/2019 Negative Negative    Hepatitis B Lab Results  Component Value Date   HEPBSAB NON-REACTIVE 06/22/2019   HEPBSAG NON-REACTIVE 06/22/2019   HEPBCAB NON-REACTIVE 06/22/2019   Hepatitis C Lab Results  Component Value Date   HEPCAB NON-REACTIVE 06/22/2019   Hepatitis A Lab Results  Component Value Date   HAV NON-REACTIVE 06/22/2019   Lipids: Lab Results  Component Value Date   CHOL 141 06/22/2019   TRIG 58 06/22/2019   HDL 53 06/22/2019   CHOLHDL 2.7 06/22/2019   LDLCALC 75 06/22/2019    Current HIV Regimen: Biktarvy  Assessment: Jimmy Fitzgerald is here in clinic today to establish care with [facility] and Dr. Luciana Fitzgerald. I saw him as well to introduce him to the pharmacy team. He has no issues tolerating  Biktarvy and he has been on this medication since July of 2020. His biggest problem is getting the medication. He previoulsy had a [place] insurance and was having his medication shipped to him through a pharmacy in [place] but he wasn't receiving the medication. He says he hasn't missed any doses despite having trouble getting his medication. He is ADAP approved so he can fill at the [facility] on [address] and he should have no issues getting his medication. He will call us with any issues he runs into in the future.   Plan: - Continue Biktarvy  - F/u with Dr. Adele Fitzgerald, PharmD PGY2 Infectious Disease Pharmacy Resident  [facility] 08/18/2019, 9:31 AM

## 2019-08-19 ENCOUNTER — Telehealth: Payer: Self-pay

## 2019-08-19 LAB — CYTOLOGY, (ORAL, ANAL, URETHRAL) ANCILLARY ONLY
Chlamydia: NEGATIVE
Chlamydia: POSITIVE — AB
Comment: NEGATIVE
Comment: NEGATIVE
Comment: NORMAL
Comment: NORMAL
Neisseria Gonorrhea: POSITIVE — AB
Neisseria Gonorrhea: POSITIVE — AB

## 2019-08-19 NOTE — Telephone Encounter (Signed)
-----   Message from Gardiner Barefoot, MD sent at 08/19/2019  2:34 PM EDT ----- He is positive for GC and chlamydia and needs treatment. Ceftriaxone 500 mg IM x 1 and azithromycin 1 gram po x 1 or doxycycline 100mg  twice a day x 7 days.   thanks

## 2019-08-19 NOTE — Telephone Encounter (Signed)
Patient made aware of results and treatment needed. Patient accepted appointment for tomorrow. Patient has been advised to abstain from any sexual contact post 10 days after treatment.  Jimmy Fitzgerald

## 2019-08-20 ENCOUNTER — Other Ambulatory Visit: Payer: Self-pay

## 2019-08-20 ENCOUNTER — Ambulatory Visit (INDEPENDENT_AMBULATORY_CARE_PROVIDER_SITE_OTHER): Payer: Medicaid Other

## 2019-08-20 DIAGNOSIS — A549 Gonococcal infection, unspecified: Secondary | ICD-10-CM | POA: Diagnosis not present

## 2019-08-20 DIAGNOSIS — Z202 Contact with and (suspected) exposure to infections with a predominantly sexual mode of transmission: Secondary | ICD-10-CM | POA: Diagnosis not present

## 2019-08-20 MED ORDER — AZITHROMYCIN 250 MG PO TABS
1000.0000 mg | ORAL_TABLET | Freq: Once | ORAL | Status: AC
  Administered 2019-08-20: 1000 mg via ORAL

## 2019-08-20 MED ORDER — CEFTRIAXONE SODIUM 250 MG IJ SOLR
500.0000 mg | Freq: Once | INTRAMUSCULAR | Status: AC
  Administered 2019-08-20: 500 mg via INTRAMUSCULAR

## 2019-08-20 NOTE — Progress Notes (Signed)
Educated patient on safe sex, advised to contact local partners to get tested and treated by local health department. Answered patient's questions and will follow up as directed. Roxine Caddy, MA

## 2019-08-26 ENCOUNTER — Encounter: Payer: Self-pay | Admitting: Infectious Diseases

## 2019-09-15 ENCOUNTER — Encounter (HOSPITAL_COMMUNITY): Payer: Self-pay | Admitting: Emergency Medicine

## 2019-09-15 ENCOUNTER — Emergency Department (HOSPITAL_COMMUNITY)
Admission: EM | Admit: 2019-09-15 | Discharge: 2019-09-15 | Disposition: A | Discharge status: Home / Self Care | Payer: Medicaid Other | Attending: Emergency Medicine | Admitting: Emergency Medicine

## 2019-09-15 DIAGNOSIS — Z79899 Other long term (current) drug therapy: Secondary | ICD-10-CM | POA: Diagnosis not present

## 2019-09-15 DIAGNOSIS — I10 Essential (primary) hypertension: Secondary | ICD-10-CM | POA: Insufficient documentation

## 2019-09-15 DIAGNOSIS — B029 Zoster without complications: Secondary | ICD-10-CM | POA: Diagnosis not present

## 2019-09-15 DIAGNOSIS — Z21 Asymptomatic human immunodeficiency virus [HIV] infection status: Secondary | ICD-10-CM | POA: Insufficient documentation

## 2019-09-15 MED ORDER — GABAPENTIN 300 MG PO CAPS: 300.0000 mg | ORAL_CAPSULE | Freq: Three times a day (TID) | ORAL | 0 refills | Status: DC

## 2019-09-15 MED ORDER — OXYCODONE-ACETAMINOPHEN 5-325 MG PO TABS: 1.0000 | ORAL_TABLET | Freq: Four times a day (QID) | ORAL | 0 refills | Status: DC | PRN

## 2019-09-15 MED ORDER — IBUPROFEN 800 MG PO TABS
800.0000 mg | ORAL_TABLET | Freq: Once | ORAL | Status: AC
  Administered 2019-09-15: 800 mg via ORAL
  Filled 2019-09-15: qty 1

## 2019-09-15 MED ORDER — VALACYCLOVIR HCL 1 G PO TABS
1000.0000 mg | ORAL_TABLET | Freq: Three times a day (TID) | ORAL | 0 refills | Status: DC
Start: 2019-09-15 — End: 2019-10-07

## 2019-09-15 MED ORDER — VALACYCLOVIR HCL 1 G PO TABS
1000.0000 mg | ORAL_TABLET | Freq: Three times a day (TID) | ORAL | 0 refills | Status: DC
Start: 2019-09-15 — End: 2019-09-15

## 2019-09-15 MED ORDER — GABAPENTIN 300 MG PO CAPS: 300.0000 mg | ORAL_CAPSULE | Freq: Three times a day (TID) | ORAL | 0 refills | Status: AC

## 2019-09-15 NOTE — ED Provider Notes (Signed)
[facility] EMERGENCY DEPARTMENT Provider Note   CSN: [id number] Arrival date & time: 09/15/19  1343     History No chief complaint on file.   Jimmy Fitzgerald is a 29 y.o. male.  The history is provided by the patient and medical records. No language interpreter was used.     29 year old male with significant history of HIV, syphilis, shingles, presenting for evaluation of a rash.  Patient report within the past 24 hours he has noticed a significant rash that started in his right buttocks and spread down to his right leg.  Described pain as a sharp burning stabbing pins-and-needles sensation, of moderate to severe in severity.  Pain is not associate with fever or chills no abdominal pain no vision changes no focal numbness or focal weakness.  Denies any change in medication or environment.  Admits to be in quite a bit of stress but denies any recent sickness.  He is compliant with his HIV medication but unable to recall last CD4 count or viral load.  He is taking Biktarvy.  He has never had shingle before.  Past Medical History:  Diagnosis Date  . HIV positive [facility])     Patient Active Problem List   Diagnosis Date Noted  . Routine screening for STI (sexually transmitted infection) 08/18/2019  . Peripheral neuropathy 07/06/2019  . History of syphilis 07/06/2019  . Hypertension 07/06/2019  . Anemia 07/06/2019  . Diarrhea 08/19/2018  . HIV (human immunodeficiency virus infection) ([facility]) 08/19/2018  . 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 No pertinent surgical history.     No family history on file.  Social History   Tobacco Use  . Smoking status: Not on file  Substance Use Topics  . Alcohol use: Not on file  . Drug use: Not on file    Home Medications Prior to Admission medications   Medication Sig Start Date End Date Taking? Authorizing Provider  albuterol (VENTOLIN HFA) 108 (90 Base) MCG/ACT inhaler Inhale 1.2 puffs into the lungs  every 6 (six) hours as needed for wheezing. 01/27/16   [provider]  BIKTARVY 50-200-25 MG TABS tablet Take 1 tablet by mouth daily. 08/18/19   Comer, Belia Heman, MD  clotrimazole (MYCELEX) 10 MG troche Take 10 mg by mouth 3 (three) times daily. Patient not taking: Reported on 08/18/2019    [provider]  gabapentin (NEURONTIN) 300 MG capsule Take 300 mg by mouth 3 (three) times daily. Patient not taking: Reported on 08/18/2019 12/09/18   [provider]  lisinopril (ZESTRIL) 10 MG tablet Take 10 mg by mouth daily. Patient not taking: Reported on 08/18/2019 11/27/18   [provider]  sulfamethoxazole-trimethoprim (BACTRIM DS) 800-160 MG tablet Take 1 tablet by mouth daily. 08/18/19   Comer, Belia Heman, MD    Allergies    Patient has no known allergies.  Review of Systems   Review of Systems  Constitutional: Negative for fever.  Skin: Positive for rash.    Physical Exam Updated Vital Signs BP (!) 151/98 (BP Location: Left Arm)   Pulse 88   Temp 99 F (37.2 C) (Oral)   Resp 18   Ht 5\' 3"  (1.6 m)   Wt 87.5 kg   SpO2 100%   BMI 34.19 kg/m   Physical Exam Vitals and nursing note reviewed.  Constitutional:      General: He is not in acute distress.    Appearance: He is well-developed.  HENT:     Head: Atraumatic.  Eyes:     Conjunctiva/sclera: Conjunctivae normal.  Musculoskeletal:     Cervical back: Neck supple.  Skin:    Findings: No rash (Multiple vesicular lesions following S1 dermatomal distribution on the right posterior leg.).  Neurological:     Mental Status: He is alert.     ED Results / Procedures / Treatments   Labs (all labs ordered are listed, but only abnormal results are displayed) Labs Reviewed - No data to display  EKG None  Radiology No results found.  Procedures Procedures (including critical care time)  Medications Ordered in ED Medications - No data to display  ED Course  I have reviewed the triage  vital signs and the nursing notes.  Pertinent labs & imaging results that were available during my care of the patient were reviewed by me and considered in my medical decision making (see chart for details).    MDM Rules/Calculators/A&P                          BP (!) 151/98 (BP Location: Left Arm)   Pulse 88   Temp 99 F (37.2 C) (Oral)   Resp 18   Ht 5\' 3"  (1.6 m)   Wt 87.5 kg   SpO2 100%   BMI 34.19 kg/m   Final Clinical Impression(s) / ED Diagnoses Final diagnoses:  Herpes zoster without complication    Rx / DC Orders ED Discharge Orders    None     5:54 PM Patient with history of HIV presenting with blistering rash that follows the S1 dermatomal distribution on his right posterior leg consistent with shingles.  Will treat with Valtrex and gabapentin.  Recommend outpatient follow-up with infectious disease specialist for wound recheck.  Return precaution discussed.  Patient without systemic manifestation.   , PA-C 09/15/19 09/17/19, MD 09/15/19 2230

## 2019-09-15 NOTE — Discharge Instructions (Signed)
You have been diagnosed with shingles.  Take Valtrex as prescribed.  Take gabapentin as needed for nerve pain.  Take Percocet only when the pain is intense, but avoid driving or operating heavy machinery as the medication can cause drowsiness.  Follow-up with your infectious disease specialist for further care.

## 2019-09-15 NOTE — ED Triage Notes (Signed)
Pt here with what looks like a shingles rash from the top of his right buttox down his right right leg  Does not cross midline ,painful and burns

## 2019-10-07 ENCOUNTER — Encounter: Payer: Self-pay | Admitting: Internal Medicine

## 2019-10-07 ENCOUNTER — Ambulatory Visit (INDEPENDENT_AMBULATORY_CARE_PROVIDER_SITE_OTHER): Payer: Medicaid Other | Admitting: Internal Medicine

## 2019-10-07 ENCOUNTER — Ambulatory Visit: Payer: Medicaid Other

## 2019-10-07 ENCOUNTER — Other Ambulatory Visit: Payer: Self-pay

## 2019-10-07 VITALS — BP 135/93 | HR 78 | Temp 98.4°F | Wt 183.0 lb

## 2019-10-07 DIAGNOSIS — B029 Zoster without complications: Secondary | ICD-10-CM

## 2019-10-07 DIAGNOSIS — I10 Essential (primary) hypertension: Secondary | ICD-10-CM | POA: Diagnosis not present

## 2019-10-07 DIAGNOSIS — Z21 Asymptomatic human immunodeficiency virus [HIV] infection status: Secondary | ICD-10-CM

## 2019-10-07 DIAGNOSIS — F32A Depression, unspecified: Secondary | ICD-10-CM

## 2019-10-07 DIAGNOSIS — Z9189 Other specified personal risk factors, not elsewhere classified: Secondary | ICD-10-CM

## 2019-10-07 DIAGNOSIS — F329 Major depressive disorder, single episode, unspecified: Secondary | ICD-10-CM

## 2019-10-08 DIAGNOSIS — F329 Major depressive disorder, single episode, unspecified: Secondary | ICD-10-CM | POA: Insufficient documentation

## 2019-10-08 DIAGNOSIS — Z9189 Other specified personal risk factors, not elsewhere classified: Secondary | ICD-10-CM | POA: Insufficient documentation

## 2019-10-08 DIAGNOSIS — B029 Zoster without complications: Secondary | ICD-10-CM | POA: Insufficient documentation

## 2019-10-08 DIAGNOSIS — F32A Depression, unspecified: Secondary | ICD-10-CM | POA: Insufficient documentation

## 2019-10-08 LAB — T-HELPER CELL (CD4) - (RCID CLINIC ONLY)
CD4 % Helper T Cell: 16 % — ABNORMAL LOW (ref 33–65)
CD4 T Cell Abs: 243 /uL — ABNORMAL LOW (ref 400–1790)

## 2019-10-08 MED ORDER — SULFAMETHOXAZOLE-TRIMETHOPRIM 800-160 MG PO TABS: 1.0000 | ORAL_TABLET | Freq: Every day | ORAL | 3 refills | Status: DC

## 2019-10-08 MED ORDER — BIKTARVY 50-200-25 MG PO TABS: 1.0000 | ORAL_TABLET | Freq: Every day | ORAL | 11 refills | Status: DC

## 2019-10-08 NOTE — Progress Notes (Addendum)
   Subjective:    Patient ID: Jimmy Fitzgerald, male    DOB: 1990-05-26, 29 y.o.   MRN: [id number]  HPI He is here for follow up of HIV He has continued to [facility] and no missed doses.  He though was unaware of how or where he can get refills.  His initial CD4 here was 170 with a viral load < 20 and had just recently restarted his medications after being out of care.  He continues on the [place] and no concerns.  He continues to struggle with depression and has no family support.  No new complaints otherwise.  Has not yet had the COVID vaccine.  He was diagnosed with shingles on his leg and this has resolved.    Review of Systems  Constitutional: Negative for fatigue.  Gastrointestinal: Negative for diarrhea and nausea.  Skin: Negative for rash.       Objective:   Physical Exam Eyes:     General: No scleral icterus. Cardiovascular:     Rate and Rhythm: Normal rate and regular rhythm.     Heart sounds: No murmur heard.   Pulmonary:     Effort: Pulmonary effort is normal.  Neurological:     Mental Status: He is alert.  Psychiatric:        Mood and Affect: Mood normal.   SH: no tobacco       Assessment & Plan:

## 2019-10-08 NOTE — Assessment & Plan Note (Signed)
Diagnosed in the ED and now resolved

## 2019-10-08 NOTE — Addendum Note (Signed)
Addended by: Gardiner Barefoot on: 10/08/2019 10:13 AM   Modules accepted: Orders

## 2019-10-08 NOTE — Assessment & Plan Note (Signed)
He continues to have depression related to his diagnosis and will get him seen by our counselor asap.

## 2019-10-08 NOTE — Assessment & Plan Note (Signed)
He continues to do well.  Will check his labs today. His Alycia Rossetti White/HMAP is up to date and renewed again today so he can pick up medications.   rtc 3-4 months.

## 2019-10-08 NOTE — Assessment & Plan Note (Addendum)
He remains off medication and BP noted.  He knows to establish with a PCP

## 2019-10-08 NOTE — Assessment & Plan Note (Deleted)
Will screen today 

## 2019-10-08 NOTE — Assessment & Plan Note (Signed)
He will continue with Bactrim

## 2019-10-10 LAB — HIV-1 RNA QUANT-NO REFLEX-BLD
HIV 1 RNA Quant: <20 Copies/mL — ABNORMAL HIGH
HIV-1 RNA Quant, Log: <1.3 Log cps/mL — ABNORMAL HIGH

## 2019-11-01 ENCOUNTER — Emergency Department (HOSPITAL_COMMUNITY): Payer: Medicaid Other

## 2019-11-01 ENCOUNTER — Encounter (HOSPITAL_COMMUNITY): Payer: Self-pay | Admitting: Student

## 2019-11-01 ENCOUNTER — Inpatient Hospital Stay (HOSPITAL_COMMUNITY)
Admission: EM | Admit: 2019-11-01 | Discharge: 2019-11-04 | DRG: 372 | Disposition: A | Discharge status: Home / Self Care | Payer: Medicaid Other | Attending: Internal Medicine | Admitting: Internal Medicine

## 2019-11-01 ENCOUNTER — Other Ambulatory Visit: Payer: Self-pay

## 2019-11-01 DIAGNOSIS — K921 Melena: Secondary | ICD-10-CM | POA: Diagnosis present

## 2019-11-01 DIAGNOSIS — Z20822 Contact with and (suspected) exposure to covid-19: Secondary | ICD-10-CM | POA: Diagnosis present

## 2019-11-01 DIAGNOSIS — A042 Enteroinvasive Escherichia coli infection: Secondary | ICD-10-CM | POA: Diagnosis not present

## 2019-11-01 DIAGNOSIS — R531 Weakness: Secondary | ICD-10-CM | POA: Diagnosis not present

## 2019-11-01 DIAGNOSIS — M791 Myalgia, unspecified site: Secondary | ICD-10-CM | POA: Diagnosis not present

## 2019-11-01 DIAGNOSIS — Z79899 Other long term (current) drug therapy: Secondary | ICD-10-CM

## 2019-11-01 DIAGNOSIS — T402X5A Adverse effect of other opioids, initial encounter: Secondary | ICD-10-CM | POA: Diagnosis not present

## 2019-11-01 DIAGNOSIS — J45909 Unspecified asthma, uncomplicated: Secondary | ICD-10-CM | POA: Diagnosis not present

## 2019-11-01 DIAGNOSIS — B20 Human immunodeficiency virus [HIV] disease: Secondary | ICD-10-CM

## 2019-11-01 DIAGNOSIS — R330 Drug induced retention of urine: Secondary | ICD-10-CM | POA: Diagnosis not present

## 2019-11-01 DIAGNOSIS — R7989 Other specified abnormal findings of blood chemistry: Secondary | ICD-10-CM

## 2019-11-01 DIAGNOSIS — R34 Anuria and oliguria: Secondary | ICD-10-CM | POA: Diagnosis not present

## 2019-11-01 DIAGNOSIS — Z21 Asymptomatic human immunodeficiency virus [HIV] infection status: Secondary | ICD-10-CM | POA: Diagnosis present

## 2019-11-01 DIAGNOSIS — K529 Noninfective gastroenteritis and colitis, unspecified: Secondary | ICD-10-CM | POA: Diagnosis not present

## 2019-11-01 DIAGNOSIS — E860 Dehydration: Secondary | ICD-10-CM | POA: Diagnosis present

## 2019-11-01 DIAGNOSIS — A044 Other intestinal Escherichia coli infections: Secondary | ICD-10-CM

## 2019-11-01 DIAGNOSIS — A09 Infectious gastroenteritis and colitis, unspecified: Secondary | ICD-10-CM | POA: Diagnosis present

## 2019-11-01 DIAGNOSIS — A071 Giardiasis [lambliasis]: Secondary | ICD-10-CM

## 2019-11-01 DIAGNOSIS — N179 Acute kidney failure, unspecified: Secondary | ICD-10-CM

## 2019-11-01 DIAGNOSIS — A038 Other shigellosis: Secondary | ICD-10-CM | POA: Diagnosis present

## 2019-11-01 DIAGNOSIS — R519 Headache, unspecified: Secondary | ICD-10-CM | POA: Diagnosis not present

## 2019-11-01 HISTORY — DX: Unspecified asthma, uncomplicated: J45.909

## 2019-11-01 LAB — URINALYSIS, ROUTINE W REFLEX MICROSCOPIC
Bacteria, UA: NONE SEEN
Bilirubin Urine: NEGATIVE
Glucose, UA: NEGATIVE mg/dL
Ketones, ur: NEGATIVE mg/dL
Leukocytes,Ua: NEGATIVE
Nitrite: NEGATIVE
Protein, ur: 30 mg/dL — AB
Specific Gravity, Urine: 1.027 (ref 1.005–1.030)
pH: 5 (ref 5.0–8.0)

## 2019-11-01 LAB — CBC
HCT: 45.1 % (ref 39.0–52.0)
Hemoglobin: 14.5 g/dL (ref 13.0–17.0)
MCH: 29.5 pg (ref 26.0–34.0)
MCHC: 32.2 g/dL (ref 30.0–36.0)
MCV: 91.7 fL (ref 80.0–100.0)
Platelets: 144 10*3/uL — ABNORMAL LOW (ref 150–400)
RBC: 4.92 MIL/uL (ref 4.22–5.81)
RDW: 13.5 % (ref 11.5–15.5)
WBC: 9.8 10*3/uL (ref 4.0–10.5)
nRBC: 0 % (ref 0.0–0.2)

## 2019-11-01 LAB — LIPASE, BLOOD: Lipase: 23 U/L (ref 11–51)

## 2019-11-01 LAB — COMPREHENSIVE METABOLIC PANEL
ALT: 13 U/L (ref 0–44)
AST: 12 U/L — ABNORMAL LOW (ref 15–41)
Albumin: 3.3 g/dL — ABNORMAL LOW (ref 3.5–5.0)
Alkaline Phosphatase: 57 U/L (ref 38–126)
Anion gap: 11 (ref 5–15)
BUN: 12 mg/dL (ref 6–20)
CO2: 18 mmol/L — ABNORMAL LOW (ref 22–32)
Calcium: 8.7 mg/dL — ABNORMAL LOW (ref 8.9–10.3)
Chloride: 104 mmol/L (ref 98–111)
Creatinine, Ser: 1.42 mg/dL — ABNORMAL HIGH (ref 0.61–1.24)
GFR calc Af Amer: >60 mL/min (ref >60)
GFR calc non Af Amer: >60 mL/min (ref >60)
Glucose, Bld: 136 mg/dL — ABNORMAL HIGH (ref 70–99)
Potassium: 3.7 mmol/L (ref 3.5–5.1)
Sodium: 133 mmol/L — ABNORMAL LOW (ref 135–145)
Total Bilirubin: 1.1 mg/dL (ref 0.3–1.2)
Total Protein: 7.9 g/dL (ref 6.5–8.1)

## 2019-11-01 LAB — C DIFFICILE QUICK SCREEN W PCR REFLEX
C Diff antigen: NEGATIVE
C Diff interpretation: NOT DETECTED
C Diff toxin: NEGATIVE

## 2019-11-01 LAB — SARS CORONAVIRUS 2 BY RT PCR (HOSPITAL ORDER, PERFORMED IN ~~LOC~~ HOSPITAL LAB): SARS Coronavirus 2: NEGATIVE

## 2019-11-01 LAB — POC OCCULT BLOOD, ED: Fecal Occult Bld: NEGATIVE

## 2019-11-01 MED ORDER — SODIUM CHLORIDE 0.9 % IV SOLN: INTRAVENOUS | Status: AC

## 2019-11-01 MED ORDER — BICTEGRAVIR-EMTRICITAB-TENOFOV 50-200-25 MG PO TABS
1.0000 | ORAL_TABLET | Freq: Every day | ORAL | Status: DC
  Administered 2019-11-01 – 2019-11-04 (×4): 1 via ORAL
  Filled 2019-11-01 (×4): qty 1

## 2019-11-01 MED ORDER — VALGANCICLOVIR HCL 450 MG PO TABS
900.0000 mg | ORAL_TABLET | Freq: Two times a day (BID) | ORAL | Status: DC
  Administered 2019-11-01: 900 mg via ORAL
  Filled 2019-11-01 (×2): qty 2

## 2019-11-01 MED ORDER — ACETAMINOPHEN 650 MG RE SUPP: 650.0000 mg | Freq: Four times a day (QID) | RECTAL | Status: DC | PRN

## 2019-11-01 MED ORDER — IOHEXOL 300 MG/ML  SOLN
100.0000 mL | Freq: Once | INTRAMUSCULAR | Status: AC | PRN
  Administered 2019-11-01: 100 mL via INTRAVENOUS

## 2019-11-01 MED ORDER — MORPHINE SULFATE (PF) 4 MG/ML IV SOLN
4.0000 mg | Freq: Once | INTRAVENOUS | Status: AC
  Administered 2019-11-01: 4 mg via INTRAVENOUS
  Filled 2019-11-01: qty 1

## 2019-11-01 MED ORDER — ALBUTEROL SULFATE (2.5 MG/3ML) 0.083% IN NEBU: 2.5000 mg | INHALATION_SOLUTION | Freq: Four times a day (QID) | RESPIRATORY_TRACT | Status: DC | PRN

## 2019-11-01 MED ORDER — CIPROFLOXACIN IN D5W 400 MG/200ML IV SOLN
400.0000 mg | Freq: Two times a day (BID) | INTRAVENOUS | Status: DC
  Administered 2019-11-01 – 2019-11-02 (×2): 400 mg via INTRAVENOUS
  Filled 2019-11-01 (×3): qty 200

## 2019-11-01 MED ORDER — SODIUM CHLORIDE 0.9 % IV BOLUS
1000.0000 mL | Freq: Once | INTRAVENOUS | Status: AC
  Administered 2019-11-01: 1000 mL via INTRAVENOUS

## 2019-11-01 MED ORDER — ACETAMINOPHEN 325 MG PO TABS
650.0000 mg | ORAL_TABLET | Freq: Four times a day (QID) | ORAL | Status: DC | PRN
  Administered 2019-11-01 – 2019-11-04 (×2): 650 mg via ORAL
  Filled 2019-11-01 (×3): qty 2

## 2019-11-01 MED ORDER — ONDANSETRON HCL 4 MG/2ML IJ SOLN
4.0000 mg | Freq: Once | INTRAMUSCULAR | Status: AC
  Administered 2019-11-01: 4 mg via INTRAVENOUS
  Filled 2019-11-01: qty 2

## 2019-11-01 MED ORDER — SODIUM CHLORIDE 0.9 % IV BOLUS
500.0000 mL | Freq: Once | INTRAVENOUS | Status: AC
  Administered 2019-11-01: 500 mL via INTRAVENOUS

## 2019-11-01 MED ORDER — ONDANSETRON HCL 4 MG PO TABS: 4.0000 mg | ORAL_TABLET | Freq: Four times a day (QID) | ORAL | Status: DC | PRN

## 2019-11-01 MED ORDER — METRONIDAZOLE IN NACL 5-0.79 MG/ML-% IV SOLN
500.0000 mg | Freq: Three times a day (TID) | INTRAVENOUS | Status: DC
  Administered 2019-11-01 – 2019-11-02 (×3): 500 mg via INTRAVENOUS
  Filled 2019-11-01 (×4): qty 100

## 2019-11-01 MED ORDER — ONDANSETRON HCL 4 MG/2ML IJ SOLN: 4.0000 mg | Freq: Four times a day (QID) | INTRAMUSCULAR | Status: DC | PRN

## 2019-11-01 MED ORDER — HYDROMORPHONE HCL 1 MG/ML IJ SOLN
1.0000 mg | INTRAMUSCULAR | Status: DC | PRN
  Administered 2019-11-01 – 2019-11-04 (×10): 1 mg via INTRAVENOUS
  Filled 2019-11-01 (×11): qty 1

## 2019-11-01 NOTE — Hospital Course (Addendum)
Admitted 11/01/2019  Allergies: Patient has no known allergies. Pertinent Hx: HIV, asthma  29 y.o. male p/w sudden onset abdominal pain, bloody diarrhea x 2days  Consults: none Meds: Bactarvy, cipro, metronidazole VTE ppx: SCDs IVF: NS Diet: clear   with ganciclovir 5 mg/kg IV every 12 hours for 14 days.  Denies n/v  ROS: + palpitations, +sob with pain only, +generalized weakness, +chills -cough, -fever  ***  Patient reports that he still has low appetite, feels that he cannot drink or eat very much. Denies nausea or pain, but states that he starts to feel full early. Reports some continued abdominal discomfort. He had good urine output last night. Feels like he has to "push" more initially than he normally has to. Has not had any more diarrhea in past 2 days.

## 2019-11-01 NOTE — Progress Notes (Addendum)
Pharmacy Antibiotic Note  Jimmy Fitzgerald is a 29 y.o. male admitted on 11/01/2019 with abdominal pain and concern for intra-abdominal infection. Pharmacy has been consulted for cipro dosing (he is also noted on flagyl). -WBC= 9.8, tmax= 99, CrCl ~ 75  Plan: -Cipro 400mg  IV q12h -Will follow renal function, cultures and clinical progress   Height: 5\' 9"  (175.3 cm) Weight: 78.5 kg (173 lb) IBW/kg (Calculated) : 70.7  Temp (24hrs), Avg:99.1 F (37.3 C), Min:99 F (37.2 C), Max:99.2 F (37.3 C)  Recent Labs  Lab 11/01/19 0911  WBC 9.8  CREATININE 1.42*    Estimated Creatinine Clearance: 75.4 mL/min (A) (by C-G formula based on SCr of 1.42 mg/dL (H)).    No Known Allergies  Antimicrobials this admission: 9/6 cdiff- neg  Dose adjustments this admission:   Microbiology results: 9/6 cdiff- neg   Thank you for allowing pharmacy to be a part of this patient's care.  11/6, PharmD Clinical Pharmacist **Pharmacist phone directory can now be found on [email or web address] ([email or web address]).  Listed under [facility].

## 2019-11-01 NOTE — H&P (Addendum)
Date: 11/01/2019               Patient Name:  Jimmy Fitzgerald MRN: [id number]  DOB: 08-04-1990 Age / Sex: 29 y.o., male   PCP: Patient, No Pcp Per              Medical Service: Internal Medicine Teaching Service              Attending Physician: Dr. Debe Coder    First Contact: Dr. Imogene Burn Pager: [phone]  Second Contact: Dr. Maryla Morrow Pager: [phone]            After Hours (After 5p/  First Contact Pager: [phone]  weekends / holidays): Second Contact Pager: [phone]   Chief Complaint: Abdominal Pain and Bloody Diarrhea  History of Present Illness: Jimmy Fitzgerald is a 29 y.o. male with a history of HIV infection on Biktarvy and Bactrim ppx who presents with abdominal pain and bloody diarrhea. He was in his usual state of health until yesterday when he was making dinner and had sudden-onset lower abdominal pain, dizziness, and diaphoresis. This was immediately followed by bloody diarrhea. He describes the diarrhea as watery, mucousy stool with a "good amount" of bright red blood mixed in. The pain comes and goes, nothing makes it better or worse. He has had 10 episodes of diarrhea since symptom onset with the most recent episode being in the ED. He has never had symptoms like this before.  He is able to tolerate water but notes he needs to have a BM soon after he takes PO.  After several episodes of the diarrhea, he started having throbbing pain and weakness in legs, arms, and headache. He reports that his weakness is profound enough that he is unable to stand/walk. Did not try any medication for this at home. Reports he is compliant with his Biktarvy. Previously had cryptosporidium but states it feels unlike current symptoms. No recent travel, no contaminated water, no contact with animals, no sick contacts.    Meds: Current Outpatient Medications  Medication Instructions  . BIKTARVY 50-200-25 MG TABS tablet 1 tablet, Oral, Daily  . gabapentin (NEURONTIN) 300 mg, Oral, 3 times daily  . lisinopril  (ZESTRIL) 10 mg, Daily  . sulfamethoxazole-trimethoprim (BACTRIM DS) 800-160 MG tablet 1 tablet, Oral, Daily  (Only taking Biktarvy and Bactrim)     Allergies: Allergies as of 11/01/2019  . (No Known Allergies)   Past Medical History:  Diagnosis Date  . Asthma   . HIV positive ([facility])    Family History  Problem Relation Age of Onset  . Diabetes Maternal Grandmother   . Diabetes Maternal Grandfather   . Heart attack Paternal Grandmother   . Breast cancer Maternal Aunt   . Crohn's disease Cousin   . Crohn's disease Cousin    Social History   Tobacco Use  . Smoking status: Never Smoker  . Smokeless tobacco: Never Used  Substance Use Topics  . Alcohol use: Not on file    Comment: on special occations only  . Drug use: Not Currently    Review of Systems:  Review of Systems  Constitutional: Positive for chills and diaphoresis. Negative for fever.  HENT: Negative for congestion and sore throat.   Eyes: Negative for blurred vision and double vision.  Respiratory: Positive for shortness of breath (with pain only). Negative for cough and sputum production.   Cardiovascular: Positive for palpitations.  Gastrointestinal: Positive for abdominal pain, blood in stool and diarrhea. Negative for nausea and vomiting.  Genitourinary: Negative  for dysuria, flank pain and hematuria.  Musculoskeletal: Positive for myalgias.  Skin: Negative for itching and rash.  Neurological: Positive for dizziness, weakness and headaches.   Physical Exam: Blood pressure 132/85, pulse 71, temperature 99.2 F (37.3 C), temperature source Oral, resp. rate 16, height 5\' 9"  (1.753 m), weight 78.5 kg, SpO2 97 %. Physical Exam Constitutional:      General: He is not in acute distress.    Comments: Uncomfortable appearing. Lying in bed with knees drawn toward chest.  HENT:     Mouth/Throat:     Mouth: Mucous membranes are dry.     Pharynx: Uvula midline. No pharyngeal swelling, oropharyngeal exudate or  posterior oropharyngeal erythema.  Cardiovascular:     Rate and Rhythm: Normal rate and regular rhythm.     Heart sounds: Normal heart sounds. No murmur heard.   Pulmonary:     Effort: Pulmonary effort is normal.     Breath sounds: Normal breath sounds.  Abdominal:     General: There is no distension.     Palpations: Abdomen is soft. There is no hepatomegaly, splenomegaly or mass.     Tenderness: There is abdominal tenderness in the right lower quadrant, suprapubic area and left lower quadrant. There is no guarding or rebound.  Neurological:     Mental Status: He is alert.     Motor: Weakness present.     Comments: Strength 4/5 in all extremities. Symmetric.  Hand grip strength 4/5.   Psychiatric:        Mood and Affect: Mood and affect normal.      EKG: personally reviewed my interpretation is normal sinus rhythm.     Assessment & Plan by Problem: Jimmy Fitzgerald is a 29 y/o M with hx of HIV on biktarvy and bactrim who presents with abdominal pain and bloody diarrhea. Hemodynamically stable, starting on broad spectrum antibiotics.   Abdominal Pain and Bloody Diarrhea- CT scan demonstrating colon wall thickening of descending and sigmoid colon and enlarged lymph nodes consistent with infectious or inflammatory colitis. His symptoms are consistent with an infectious colitis. Given his HIV status, there is a strong likelihood that this may be an opportunistic infection. Greatest concern for CMV, Salmonella, Yersinia, Shigella, or Campylobacter spp. Hemoglobin nl on CBC suggesting no excessive blood loss, and FOBT negative - GIPP pending.  - Pain control with morphine.  - Will initiate empiric therapy with levofloxacin and metronidazole.  - holding off on CMV coverage for now - If GIPP is negative or no improvement on anti-microbial therapy, consider possible inflammatory bowel disease given positive family history.  -clear liquid diet for now -CBC  tomorrow  Headache/Myalgias/Weakness: Likely due to dehydration 2/2 large-volume diarrhea. S/p 1L bolus, still presenting with dry mucous membranes.  - Will order another bolus - LR at 100cc/hr for 10hr - Monitor electrolytes with repeat CMP in am  HIV Reports compliance with daily Biktarvy and Bactrim. Last testing on 10/07/19 with HIV RNA <20, CD4 count 243. -continue home biktarvy and bactrim  Creatinine elevation Creatinine of 1.42 on admission, unclear baseline.  -BMP tomorrow  Asthma Reports mild asthma, does not require his inhaler daily. -albuterol inhaler prn  Dispo: Admit patient to Inpatient with expected length of stay greater than 2 midnights.  Signed: 12/07/19, Medical Student 11/01/2019, 4:21 PM  Pager: [phone]   Attestation for Student Documentation:  I personally was present and performed or re-performed the history, physical exam and medical decision-making activities of this service and have verified that  the service and findings are accurately documented in the student's note.  Remo Lipps, MD 11/01/2019, 5:38 PM

## 2019-11-01 NOTE — ED Provider Notes (Addendum)
[facility] Note   CSN: [id number] Arrival date & time: 11/01/19  0900     History Chief Complaint  Patient presents with  . Abdominal Pain  . Diarrhea    Jimmy Fitzgerald is a 29 y.o. male with past medical history significant for HIV.  Labs x 3 weeks ago show CD4 count 243, viral load <20. Currently on prophylactic bactrim.  HPI Patient presents to emergency department today with chief complaint of lower abdominal pain and diarrhea x2 days.  Patient states abdominal pain has been constant.  It radiates throughout his lower abdomen.  He describes the pain as sharp and throbbing.  He rates the pain 8 out of 10 in severity.  He is also endorsing bloody diarrhea.  He describes it as bright red blood in his stool.  He has had more than 10 episodes of diarrhea in the last 24 hours.  He is also reporting generalized weakness and subjective fever and chills. He thinks his weakness is related to his frequent diarrhea. He has been unable to tolerate PO intake as he is feeling so poorly. He denies any recent travel, suspicious food intake. No medications for his symptoms prior to arrival.  He denies any cough, congestion, shortness of breath, chest pain, nausea, emesis, back pain, gross hematuria, any frequency, dysuria, black tarry stool, rash.  No known sick contacts.  He is also requesting Covid test.      Past Medical History:  Diagnosis Date  . HIV positive [facility]     Patient Active Problem List   Diagnosis Date Noted  . Depression 10/08/2019  . Shingles outbreak 10/08/2019  . At risk for opportunistic infections 10/08/2019  . Routine screening for STI (sexually transmitted infection) 08/18/2019  . Peripheral neuropathy 07/06/2019  . History of syphilis 07/06/2019  . Hypertension 07/06/2019  . Anemia 07/06/2019  . HIV (human immunodeficiency virus infection) (HCC) 08/19/2018  . 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     Family History  Problem Relation Age of Onset  . Diabetes Maternal Grandmother   . Diabetes Maternal Grandfather   . Heart attack Paternal Grandmother   . Breast cancer Maternal Aunt   . Crohn's disease Cousin   . Crohn's disease Cousin     Social History   Tobacco Use  . Smoking status: Never Smoker  . Smokeless tobacco: Never Used  Substance Use Topics  . Alcohol use: Not on file    Comment: on special occations only  . Drug use: Not Currently    Home Medications Prior to Admission medications   Medication Sig Start Date End Date Taking? Authorizing Provider  BIKTARVY 50-200-25 MG TABS tablet Take 1 tablet by mouth daily. 10/08/19   Comer, Belia Hemanobert W, MD  gabapentin (NEURONTIN) 300 MG capsule Take 1 capsule (300 mg total) by mouth 3 (three) times daily. 09/15/19   Fayrene Helperran, Bowie, PA-C  lisinopril (ZESTRIL) 10 MG tablet Take 10 mg by mouth daily. Patient not taking: Reported on 08/18/2019 11/27/18   [provider]  sulfamethoxazole-trimethoprim (BACTRIM DS) 800-160 MG tablet Take 1 tablet by mouth daily. 10/08/19   Comer, Belia Hemanobert W, MD    Allergies    Patient has no known allergies.  Review of Systems   Review of Systems All other systems are reviewed and are negative for acute change except as noted in the HPI.  Physical Exam Updated Vital Signs BP 133/83 (BP Location: Right Arm)  Pulse 97   Temp 99.2 F (37.3 C) (Oral)   Resp 16   Ht 5\' 9"  (1.753 m)   Wt 78.5 kg   SpO2 97%   BMI 25.55 kg/m   Physical Exam Vitals and nursing note reviewed.  Constitutional:      General: He is not in acute distress.    Appearance: He is not ill-appearing.  HENT:     Head: Normocephalic and atraumatic.     Right Ear: Tympanic membrane and external ear normal.     Left Ear: Tympanic membrane and external ear normal.     Nose: Nose normal.     Mouth/Throat:     Mouth: Mucous membranes are moist.     Pharynx: Oropharynx is clear.  Eyes:     General: No  scleral icterus.       Right eye: No discharge.        Left eye: No discharge.     Extraocular Movements: Extraocular movements intact.     Conjunctiva/sclera: Conjunctivae normal.     Pupils: Pupils are equal, round, and reactive to light.  Neck:     Vascular: No JVD.  Cardiovascular:     Rate and Rhythm: Normal rate and regular rhythm.     Pulses: Normal pulses.          Radial pulses are 2+ on the right side and 2+ on the left side.     Heart sounds: Normal heart sounds.  Pulmonary:     Comments: Lungs clear to auscultation in all fields. Symmetric chest rise. No wheezing, rales, or rhonchi. Abdominal:     General: Bowel sounds are normal.     Tenderness: There is no right CVA tenderness or left CVA tenderness.     Comments: Abdomen is soft, non-distended, tenderness palpation of bilateral lower quadrants and suprapubic region.  No rigidity, no guarding. No peritoneal signs.  Genitourinary:    Comments: Chaperone RN present for exam. Digital Rectal Exam reveals sphincter with good tone. No external hemorrhoids. No masses or fissures. Stool color is brown with no overt blood. No gross melena.  Musculoskeletal:        General: Normal range of motion.     Cervical back: Normal range of motion.  Skin:    General: Skin is warm and dry.     Capillary Refill: Capillary refill takes less than 2 seconds.  Neurological:     Mental Status: He is oriented to person, place, and time.     GCS: GCS eye subscore is 4. GCS verbal subscore is 5. GCS motor subscore is 6.     Comments: Fluent speech, no facial droop.  Psychiatric:        Behavior: Behavior normal.     ED Results / Procedures / Treatments   Labs (all labs ordered are listed, but only abnormal results are displayed) Labs Reviewed  COMPREHENSIVE METABOLIC PANEL - Abnormal; Notable for the following components:      Result Value   Sodium 133 (*)    CO2 18 (*)    Glucose, Bld 136 (*)    Creatinine, Ser 1.42 (*)     Calcium 8.7 (*)    Albumin 3.3 (*)    AST 12 (*)    All other components within normal limits  CBC - Abnormal; Notable for the following components:   Platelets 144 (*)    All other components within normal limits  URINALYSIS, ROUTINE W REFLEX MICROSCOPIC - Abnormal; Notable for the  following components:   Color, Urine AMBER (*)    Hgb urine dipstick SMALL (*)    Protein, ur 30 (*)    All other components within normal limits  C DIFFICILE QUICK SCREEN W PCR REFLEX  SARS CORONAVIRUS 2 BY RT PCR (HOSPITAL ORDER, PERFORMED IN [facility])  GASTROINTESTINAL PANEL BY PCR, STOOL (REPLACES STOOL CULTURE)  LIPASE, BLOOD  POC OCCULT BLOOD, ED    EKG EKG Interpretation  Date/Time:  Monday November 01 2019 12:32:24 EDT Ventricular Rate:  82 PR Interval:    QRS Duration: 82 QT Interval:  345 QTC Calculation: 403 R Axis:   34 Text Interpretation: Sinus rhythm ST elev, probable normal early repol pattern since last tracing no significant change Confirmed by Rolan Bucco [phone]) on 11/01/2019 2:44:20 PM   Radiology CT ABDOMEN PELVIS W CONTRAST  Result Date: 11/01/2019 CLINICAL DATA:  Lower abdominal pain, diarrhea. EXAM: CT ABDOMEN AND PELVIS WITH CONTRAST TECHNIQUE: Multidetector CT imaging of the abdomen and pelvis was performed using the standard protocol following bolus administration of intravenous contrast. CONTRAST:  OMNIPAQUE IOHEXOL 300 MG/ML  SOLN COMPARISON:  None. FINDINGS: Lower chest: No acute abnormality. Hepatobiliary: No focal liver abnormality is seen. No gallstones, gallbladder wall thickening, or biliary dilatation. Pancreas: Unremarkable. No pancreatic ductal dilatation or surrounding inflammatory changes. Spleen: Normal in size without focal abnormality. Adrenals/Urinary Tract: Adrenal glands are unremarkable. Kidneys are normal, without renal calculi, focal lesion, or hydronephrosis. Bladder is unremarkable. Stomach/Bowel: The stomach and appendix are  unremarkable. There is no evidence of bowel obstruction. Wall thickening and minimal surrounding inflammatory changes are seen involving the descending and sigmoid colon suggesting infectious or inflammatory colitis. Vascular/Lymphatic: No significant vascular abnormality is noted. Mildly enlarged lymph nodes are noted in the perirectal region with the largest measuring 12 mm, most likely inflammatory in etiology, although neoplasm cannot be excluded. Mildly enlarged periaortic adenopathy is also noted with the largest measuring 1 cm. 1 cm left inguinal lymph node is noted. Reproductive: Prostate is unremarkable. Other: No abdominal wall hernia or abnormality. No abdominopelvic ascites. Musculoskeletal: No acute or significant osseous findings. IMPRESSION: 1. Wall thickening and minimal surrounding inflammatory changes are seen involving the descending and sigmoid colon suggesting infectious or inflammatory colitis. 2. Mildly enlarged lymph nodes are noted in the perirectal region with the largest measuring 12 mm, most likely inflammatory in etiology, although neoplasm cannot be excluded. Mildly enlarged periaortic adenopathy is also noted with the largest measuring 1 cm. 1 cm left inguinal lymph node is also noted. Electronically Signed   By: Lupita Raider M.D.   On: 11/01/2019 13:41    Procedures Procedures (including critical care time)  Medications Ordered in ED Medications  sodium chloride 0.9 % bolus 1,000 mL (1,000 mLs Intravenous New Bag/Given 11/01/19 1343)  morphine 4 MG/ML injection 4 mg (4 mg Intravenous Given 11/01/19 1345)  ondansetron (ZOFRAN) injection 4 mg (4 mg Intravenous Given 11/01/19 1345)  iohexol (OMNIPAQUE) 300 MG/ML solution 100 mL (100 mLs Intravenous Contrast Given 11/01/19 1331)    ED Course  I have reviewed the triage vital signs and the nursing notes.  Pertinent labs & imaging results that were available during my care of the patient were reviewed by me and considered in my  medical decision making (see chart for details).  Clinical Course as of Oct 31 1716  Mon Nov 01, 2019  1238 Patient dizzy and weak with ambulation, EKG without ischemic changes   [KA]    Clinical Course  User Index [KA] Tanveer Dobberstein, Caroleen Hamman, PA-C   MDM Rules/Calculators/A&P                          History provided by patient with additional history obtained from chart review.    Patient presents to the ED with complaints of abdominal pain. Patient nontoxic appearing, in no apparent distress, vitals WNL. On exam patient tender to bilateral lower quadrants and suprapubic region. No peritoneal signs.  Ductal exam performed with chaperone present.  No gross melena.  Fecal occult negative. Labs were collected in triage.  No leukocytosis, no anemia, thrombocytopenia platelet count of 144, similar to baseline.  CMP with bicarb of 18, normal anion gap.  Does have elevated creatinine at 1.42, baseline appears to be around 1.  No significant electrolyte derangement, no transaminitis. Covid test is negative Patient given IV fluids, analgesics and antiemetic. CT abdomen pelvis concerning for infectious vs inflammatory colitis. C diff testing is negative. Stool PCR panel in process. Pain as only mildly improved after IV morphine. He has ambulated in the ED and is weak and dizzy when doing so, continues to be dizzy after IVF. Likely related to dehydration from persistent diarrhea. Findings and plan of care discussed with supervising physician Dr. Fredderick Phenix who agrees with plan to admit for infectious colitis with concern for opportunistic infection.  Spoke with IM service who agrees to assume care of patient and bring into the hospital for further evaluation and management. IM to start antibiotics.   Jaysion Ramseyer was evaluated in Emergency Department on 11/01/2019 for the symptoms described in the history of present illness. He was evaluated in the context of the global COVID-19 pandemic, which necessitated  consideration that the patient might be at risk for infection with the SARS-CoV-2 virus that causes COVID-19. Institutional protocols and algorithms that pertain to the evaluation of patients at risk for COVID-19 are in a state of rapid change based on information released by regulatory bodies including the CDC and federal and state organizations. These policies and algorithms were followed during the patient's care in the ED.   Portions of this note were generated with Scientist, clinical (histocompatibility and immunogenetics). Dictation errors may occur despite best attempts at proofreading.   Final Clinical Impression(s) / ED Diagnoses Final diagnoses:  Infectious colitis    Rx / DC Orders ED Discharge Orders    None       Sherene Sires, PA-C 11/01/19 1718    Shaniquia Brafford, Caroleen Hamman, PA-C 11/01/19 1719    Rolan Bucco, MD 11/04/19 1711

## 2019-11-01 NOTE — ED Notes (Signed)
Mother wants to know if he can be tested for COVID

## 2019-11-01 NOTE — ED Triage Notes (Signed)
Pt here for lower abdominal pain with diarrhea since yesterday. Endorses blood in diarrhea.

## 2019-11-01 NOTE — ED Notes (Addendum)
Pt reports dizziness to RN, EKG obtained. Pt aware of need for urine and stool samples.

## 2019-11-02 DIAGNOSIS — N179 Acute kidney failure, unspecified: Secondary | ICD-10-CM

## 2019-11-02 DIAGNOSIS — A09 Infectious gastroenteritis and colitis, unspecified: Secondary | ICD-10-CM

## 2019-11-02 LAB — CBC
HCT: 39.2 % (ref 39.0–52.0)
Hemoglobin: 12.6 g/dL — ABNORMAL LOW (ref 13.0–17.0)
MCH: 29.6 pg (ref 26.0–34.0)
MCHC: 32.1 g/dL (ref 30.0–36.0)
MCV: 92.2 fL (ref 80.0–100.0)
Platelets: 124 10*3/uL — ABNORMAL LOW (ref 150–400)
RBC: 4.25 MIL/uL (ref 4.22–5.81)
RDW: 13.5 % (ref 11.5–15.5)
WBC: 6.4 10*3/uL (ref 4.0–10.5)
nRBC: 0 % (ref 0.0–0.2)

## 2019-11-02 LAB — COMPREHENSIVE METABOLIC PANEL
ALT: 12 U/L (ref 0–44)
AST: 13 U/L — ABNORMAL LOW (ref 15–41)
Albumin: 2.8 g/dL — ABNORMAL LOW (ref 3.5–5.0)
Alkaline Phosphatase: 45 U/L (ref 38–126)
Anion gap: 7 (ref 5–15)
BUN: 11 mg/dL (ref 6–20)
CO2: 21 mmol/L — ABNORMAL LOW (ref 22–32)
Calcium: 8.3 mg/dL — ABNORMAL LOW (ref 8.9–10.3)
Chloride: 105 mmol/L (ref 98–111)
Creatinine, Ser: 1.34 mg/dL — ABNORMAL HIGH (ref 0.61–1.24)
GFR calc Af Amer: >60 mL/min (ref >60)
GFR calc non Af Amer: >60 mL/min (ref >60)
Glucose, Bld: 106 mg/dL — ABNORMAL HIGH (ref 70–99)
Potassium: 3.7 mmol/L (ref 3.5–5.1)
Sodium: 133 mmol/L — ABNORMAL LOW (ref 135–145)
Total Bilirubin: 0.9 mg/dL (ref 0.3–1.2)
Total Protein: 6.4 g/dL — ABNORMAL LOW (ref 6.5–8.1)

## 2019-11-02 LAB — GASTROINTESTINAL PANEL BY PCR, STOOL (REPLACES STOOL CULTURE)
Adenovirus F40/41: NOT DETECTED
Astrovirus: NOT DETECTED
Campylobacter species: NOT DETECTED
Cryptosporidium: NOT DETECTED
Cyclospora cayetanensis: NOT DETECTED
Entamoeba histolytica: NOT DETECTED
Enteroaggregative E coli (EAEC): DETECTED — AB
Enteropathogenic E coli (EPEC): NOT DETECTED
Enterotoxigenic E coli (ETEC): DETECTED — AB
Giardia lamblia: DETECTED — AB
Norovirus GI/GII: NOT DETECTED
Plesimonas shigelloides: NOT DETECTED
Rotavirus A: NOT DETECTED
Salmonella species: NOT DETECTED
Sapovirus (I, II, IV, and V): NOT DETECTED
Shiga like toxin producing E coli (STEC): NOT DETECTED
Shigella/Enteroinvasive E coli (EIEC): DETECTED — AB
Vibrio cholerae: NOT DETECTED
Vibrio species: NOT DETECTED
Yersinia enterocolitica: NOT DETECTED

## 2019-11-02 MED ORDER — CIPROFLOXACIN HCL 500 MG PO TABS
500.0000 mg | ORAL_TABLET | Freq: Two times a day (BID) | ORAL | Status: DC
  Administered 2019-11-02 – 2019-11-04 (×4): 500 mg via ORAL
  Filled 2019-11-02 (×4): qty 1

## 2019-11-02 MED ORDER — METRONIDAZOLE 500 MG PO TABS
250.0000 mg | ORAL_TABLET | Freq: Three times a day (TID) | ORAL | Status: DC
  Administered 2019-11-02 – 2019-11-04 (×6): 250 mg via ORAL
  Filled 2019-11-02 (×7): qty 1

## 2019-11-02 NOTE — Plan of Care (Signed)

## 2019-11-02 NOTE — Progress Notes (Addendum)
   Subjective:  Mr. Vaeth reports that he is feeling "better" this morning, though he endorses one episode of large volume bloody diarrhea overnight. Bloody stool was witnessed by the overnight nurse tech and he required help getting back to his bed because he felt quite weak. However, he feels that his strength is returning this morning. He states that he feels up to advancing his diet as tolerated. His headache has resolved.  Discussed GI pathogen panel showing positive E coli and Giardia. No known exposures to contaminated water sources.    Objective:  Vital signs in last 24 hours: Vitals:   11/01/19 1838 11/01/19 2157 11/02/19 0022 11/02/19 0657  BP: (!) 135/93 126/73 136/87 129/82  Pulse: 72 63 66 67  Resp: 18 18 18 18   Temp: 99.2 F (37.3 C) 98.7 F (37.1 C) 98.4 F (36.9 C) 98.3 F (36.8 C)  TempSrc: Oral Oral Oral Oral  SpO2: 99%  98% 99%  Weight:      Height:       Weight change:   Intake/Output Summary (Last 24 hours) at 11/02/2019 1036 Last data filed at 11/02/2019 0900 Gross per 24 hour  Intake 2301.56 ml  Output 300 ml  Net 2001.56 ml   Physical Exam Cardiovascular:     Rate and Rhythm: Normal rate and regular rhythm.     Heart sounds: Normal heart sounds. No murmur heard.  No gallop.   Pulmonary:     Effort: Pulmonary effort is normal.     Breath sounds: Normal breath sounds.  Abdominal:     General: There is no distension.     Palpations: Abdomen is soft.     Tenderness: There is abdominal tenderness (improved from yesterday) in the right lower quadrant, suprapubic area and left lower quadrant.  Neurological:     Motor: No weakness.     Comments: Strength 5/5 throughout  Psychiatric:        Mood and Affect: Mood and affect normal.      Assessment/Plan:  Colitis:  GIPP showing poly-infection with E. Coli (shigella tox producing), and Giardia. Bloody stool likely 2/2 shigella and large volume likely 2/2 giardia. Less likely to be CMV colitis given  other confirmed pathogens and his most recent CD4 count >200. Hemoglobin down 1.9 g/dL from yesterday. Likely due to him being hemoconcentrated on admission more than excessive blood loss. Discussed case and proper anti-microbial coverage with infectious diseases.  - Continue ciprofloxacin for 5 days per ID recommendation. - Continue metronidazole for 7 days per ID recommendation.  - CBC in am - Advance diet as tolerated  Headache/Myalgias/Weakness: Resolved with adequate hydration yesterday.  - Continue to encourage PO fluid intake  AKI: Creatinine downtrending today. 1.34, down from 1.42 yesterday. - Repeat CMP tomorrow am - Encourage PO fluid intake as tolerate - If insufficient PO intake, restart IVF  HIV: Last testing on 10/07/19 with HIV RNA <20, CD4 count 243. - Continue home Biktarvy - Bactrim prophylaxis not indicated with CD4 > 200   LOS: 1 day   12/07/19, Medical Student 11/02/2019, 10:36 AM

## 2019-11-02 NOTE — Progress Notes (Signed)
Microbiology from [place] called this RN for results of patient's GI panel, notified MD on call for results, please see chart review.

## 2019-11-03 DIAGNOSIS — R34 Anuria and oliguria: Secondary | ICD-10-CM

## 2019-11-03 DIAGNOSIS — K529 Noninfective gastroenteritis and colitis, unspecified: Secondary | ICD-10-CM

## 2019-11-03 LAB — BASIC METABOLIC PANEL
Anion gap: 5 (ref 5–15)
BUN: 10 mg/dL (ref 6–20)
CO2: 21 mmol/L — ABNORMAL LOW (ref 22–32)
Calcium: 8.4 mg/dL — ABNORMAL LOW (ref 8.9–10.3)
Chloride: 108 mmol/L (ref 98–111)
Creatinine, Ser: 1.09 mg/dL (ref 0.61–1.24)
GFR calc Af Amer: >60 mL/min (ref >60)
GFR calc non Af Amer: >60 mL/min (ref >60)
Glucose, Bld: 110 mg/dL — ABNORMAL HIGH (ref 70–99)
Potassium: 4 mmol/L (ref 3.5–5.1)
Sodium: 134 mmol/L — ABNORMAL LOW (ref 135–145)

## 2019-11-03 LAB — CBC
HCT: 36.3 % — ABNORMAL LOW (ref 39.0–52.0)
Hemoglobin: 11.6 g/dL — ABNORMAL LOW (ref 13.0–17.0)
MCH: 29.1 pg (ref 26.0–34.0)
MCHC: 32 g/dL (ref 30.0–36.0)
MCV: 91 fL (ref 80.0–100.0)
Platelets: DECREASED 10*3/uL (ref 150–400)
RBC: 3.99 MIL/uL — ABNORMAL LOW (ref 4.22–5.81)
RDW: 13.1 % (ref 11.5–15.5)
WBC: 3.9 10*3/uL — ABNORMAL LOW (ref 4.0–10.5)
nRBC: 0 % (ref 0.0–0.2)

## 2019-11-03 MED ORDER — LACTATED RINGERS IV SOLN: INTRAVENOUS | Status: AC

## 2019-11-03 MED ORDER — LACTATED RINGERS IV SOLN: INTRAVENOUS | Status: DC

## 2019-11-03 MED ORDER — LACTATED RINGERS IV BOLUS
1000.0000 mL | Freq: Once | INTRAVENOUS | Status: AC
  Administered 2019-11-03: 1000 mL via INTRAVENOUS

## 2019-11-03 NOTE — Plan of Care (Signed)

## 2019-11-03 NOTE — Progress Notes (Signed)
   Subjective:  Jimmy Fitzgerald reports that he is "doing better" this morning. His only complaint is that he has not urinated since last night. His IV fluids were removed yesterday in the morning. He states that he "has a hard time taking large volumes" of PO fluids and "had to take [his] time" with meals yesterday, but denies nausea and has not vomited. He is able to tolerate PO medication. He has not had any diarrhea events in the past 24 hours. He occasionally feels as though he needs to defecate, but has not produced any stool or blood per rectum.  He denies any abdominal pain today.    Objective:  Vital signs in last 24 hours: Vitals:   11/02/19 2101 11/03/19 0500 11/03/19 0536 11/03/19 0745  BP: 120/83  137/81 133/89  Pulse: 65  (!) 54 64  Resp: 18  18 18   Temp: 98.5 F (36.9 C)  98.5 F (36.9 C) 98.7 F (37.1 C)  TempSrc: Oral  Oral Oral  SpO2: 98%  97% 98%  Weight:  92.8 kg    Height:       Weight change: 14.3 kg  Intake/Output Summary (Last 24 hours) at 11/03/2019 1100 Last data filed at 11/02/2019 1500 Gross per 24 hour  Intake 320 ml  Output --  Net 320 ml   Physical Exam Vitals and nursing note reviewed.  Constitutional:      General: He is not in acute distress. Cardiovascular:     Heart sounds: Normal heart sounds. No murmur heard.  No friction rub.  Abdominal:     General: Bowel sounds are increased. There is no distension.     Tenderness: There is no abdominal tenderness.  Neurological:     Mental Status: He is alert.      Assessment/Plan:  Active Problems:   HIV (human immunodeficiency virus infection) (HCC)   Dysentery   AKI (acute kidney injury) (HCC)   Colitis, acute  Colitis: Clinically improved from yesterday. No diarrhea or blood per rectum for >24 hours. Tolerating PO antibiotics.  - Continue ciprofloxacin for 5 days per ID recommendation. - Continue metronidazole for 7 days per ID recommendation. - Full diet.  Decreased urine output: No  urination for approximately 12 hours. Multiple bladder scans by RN overnight showing no urine in bladder. Patient refused in-and-out cath. Patient euvolemic on exam. Bladder scan at bedside with ~50 cc. No evidence of kidney injury/failure (Creatine 1.09 today, down from 1.42 upon admission). Likely 2/2 poor PO fluid intake once we stopped IVF yesterday.  - Restart IVF until he produces urine. We can then remove IV fluids. - Continue to encourage PO fluid intake  AKI: Creatinine continues to downtrend.  - Fluids as above.   HIV: Last testing on 10/07/19 with HIV RNA<20, CD4 count 243. - Continue home Biktarvy - Bactrim prophylaxis not indicated with CD4 > 200.    LOS: 2 days   12/07/19, Medical Student 11/03/2019, 11:00 AM

## 2019-11-03 NOTE — Progress Notes (Signed)
Pt c/o unable to urinate. Pt stated that he feels the urge to go but unable to go once in the bathroom. Pt stated that he does feel pressure in his lower abdomen area. This RN did a bladder scan x2: 391cc and 357cc. Educated pt about an in and out cath that the MD ordered. Pt declined and stated that he will try to urinate within the next hour.  2200- Pt urinated 500cc yellow/straw, clear urine into urinal. Pt stated no pain or burning during urination. Will continue to monitor pt.

## 2019-11-03 NOTE — Discharge Summary (Signed)
Name: Jimmy Fitzgerald MRN: [id number] DOB: 1990/05/31 29 y.o. PCP: Patient, No Pcp Per  Date of Admission: 11/01/2019  9:06 AM Date of Discharge: 11/04/19 Attending Physician: Gust Rung, DO  Discharge Diagnosis: 1. Infectious colitis 2/2 Shigella/Enteroinvasive E Coli and Giardia lamblia 2. HIV  Discharge Medications: Allergies as of 11/04/2019   No Known Allergies     Medication List    STOP taking these medications   sulfamethoxazole-trimethoprim 800-160 MG tablet Commonly known as: BACTRIM DS     TAKE these medications   Biktarvy 50-200-25 MG Tabs tablet Generic drug: bictegravir-emtricitabine-tenofovir AF Take 1 tablet by mouth daily.   ciprofloxacin 500 MG tablet Commonly known as: CIPRO Take 1 tablet (500 mg total) by mouth 2 (two) times daily.   gabapentin 300 MG capsule Commonly known as: NEURONTIN Take 1 capsule (300 mg total) by mouth 3 (three) times daily.   lisinopril 10 MG tablet Commonly known as: ZESTRIL Take 10 mg by mouth daily.   metroNIDAZOLE 250 MG tablet Commonly known as: FLAGYL Take 1 tablet (250 mg total) by mouth every 8 (eight) hours for 3 days.       Disposition and follow-up:   Jimmy Fitzgerald was discharged from [facility] in Good condition.  At the hospital follow up visit please address:  1.  Follow up     A. Infectious colitis - discharged with PO Abx, ensure resolution of Sx and good PO intake     B. HIV - controlled on Biktarvy, d/ced Bactrim due to CD4 count >200     C. AKI - resolved  2.  Labs / imaging needed at time of follow-up: BMP, CBC  3.  Pending labs/ test needing follow-up: none  Follow-up Appointments:   Hospital Course by problem list: Colitis: Jimmy Fitzgerald presented to the [facility] on 11/01/19 with one day of abdominal pain and large-volume bloody diarrhea. CT scan obtained in the ED showed infectious or inflammatory colitis, though Jimmy Fitzgerald remained afebrile and without leukocytosis for the  duration of his stay. GI Pathogen Panel was positive for Shigella/Enteroinvasive E Coli and Giardia, negative for C. diff. He was treated with metronidazole and ciprofloxacin per ID recommendations. He showed rapid clinical improvement, with only one episode of diarrhea early in his hospital stay. His appetite returned quickly, though he felt he needed to "take it slow" to avoid upsetting his stomach. Good PO intake upon time of discharge.  AKI: Jimmy Fitzgerald was found to have an AKI upon admission, with a creatinine of 1.42, up from his baseline of 0.99. This was thought to be prerenal and 2/2 his large-volume diarrhea. He received two boluses in the ED and additional fluids on the floor. His creatinine initially decreased to baseline, had a mild bump on day of discharge likely due to relative dehydration on the day before.  HIV: On admission, Jimmy Fitzgerald reported that his current HIV regimen included daily Biktarvy and Bactrim prophylaxis. We continued the Biktarvy throughout his hospitalization but stopped the Bactrim as his most recent CD4 count was 243 (10/07/19).  Discharge Vitals:   BP 127/81   Pulse 68   Temp 98.2 F (36.8 C) (Oral)   Resp 17   Ht 5\' 9"  (1.753 m)   Wt 92.8 kg   SpO2 99%   BMI 30.21 kg/m   Pertinent Labs, Studies, and Procedures:  CT ABDOMEN PELVIS W CONTRAST  Result Date: 11/01/2019 CLINICAL DATA:  Lower abdominal pain, diarrhea. EXAM: CT ABDOMEN AND PELVIS WITH  CONTRAST TECHNIQUE: Multidetector CT imaging of the abdomen and pelvis was performed using the standard protocol following bolus administration of intravenous contrast. CONTRAST:  OMNIPAQUE IOHEXOL 300 MG/ML  SOLN COMPARISON:  None. FINDINGS: Lower chest: No acute abnormality. Hepatobiliary: No focal liver abnormality is seen. No gallstones, gallbladder wall thickening, or biliary dilatation. Pancreas: Unremarkable. No pancreatic ductal dilatation or surrounding inflammatory changes. Spleen: Normal in size  without focal abnormality. Adrenals/Urinary Tract: Adrenal glands are unremarkable. Kidneys are normal, without renal calculi, focal lesion, or hydronephrosis. Bladder is unremarkable. Stomach/Bowel: The stomach and appendix are unremarkable. There is no evidence of bowel obstruction. Wall thickening and minimal surrounding inflammatory changes are seen involving the descending and sigmoid colon suggesting infectious or inflammatory colitis. Vascular/Lymphatic: No significant vascular abnormality is noted. Mildly enlarged lymph nodes are noted in the perirectal region with the largest measuring 12 mm, most likely inflammatory in etiology, although neoplasm cannot be excluded. Mildly enlarged periaortic adenopathy is also noted with the largest measuring 1 cm. 1 cm left inguinal lymph node is noted. Reproductive: Prostate is unremarkable. Other: No abdominal wall hernia or abnormality. No abdominopelvic ascites. Musculoskeletal: No acute or significant osseous findings. IMPRESSION: 1. Wall thickening and minimal surrounding inflammatory changes are seen involving the descending and sigmoid colon suggesting infectious or inflammatory colitis. 2. Mildly enlarged lymph nodes are noted in the perirectal region with the largest measuring 12 mm, most likely inflammatory in etiology, although neoplasm cannot be excluded. Mildly enlarged periaortic adenopathy is also noted with the largest measuring 1 cm. 1 cm left inguinal lymph node is also noted. Electronically Signed   By: Lupita Raider M.D.   On: 11/01/2019 13:41     Gastrointestinal Panel by PCR , Stool [[id number]] (Abnormal) Collected: 11/01/19 1238  Order Status: Completed Specimen: Stool Updated: 11/02/19 0044   Campylobacter species NOT DETECTED   Plesimonas shigelloides NOT DETECTED   Salmonella species NOT DETECTED   Yersinia enterocolitica NOT DETECTED   Vibrio species NOT DETECTED   Vibrio cholerae NOT DETECTED   Enteroaggregative E coli (EAEC)  DETECTEDAbnormal   Comment: RESULT CALLED TO, READ BACK BY AND VERIFIED WITHElenore Paddy RN [id number] 11/02/19 HNM      Enteropathogenic E coli (EPEC) NOT DETECTED   Enterotoxigenic E coli (ETEC) DETECTEDAbnormal   Comment: RESULT CALLED TO, READ BACK BY AND VERIFIED WITHElenore Paddy RN [id number] 11/02/19 HNM      Shiga like toxin producing E coli (STEC) NOT DETECTED   Shigella/Enteroinvasive E coli (EIEC) DETECTEDAbnormal   Comment: RESULT CALLED TO, READ BACK BY AND VERIFIED WITHElenore Paddy RN [id number] 11/02/19 HNM      Cryptosporidium NOT DETECTED   Cyclospora cayetanensis NOT DETECTED   Entamoeba histolytica NOT DETECTED   Giardia lamblia DETECTEDAbnormal   Comment: RESULT CALLED TO, READ BACK BY AND VERIFIED WITHElenore Paddy RN [phone] 11/02/19 HNM      Adenovirus F40/41 NOT DETECTED   Astrovirus NOT DETECTED   Norovirus GI/GII NOT DETECTED   Rotavirus A NOT DETECTED   Sapovirus (I, II, IV, and V) NOT DETECTED      Discharge Instructions: Discharge Instructions    Call MD for:  extreme fatigue   Complete by: As directed    Call MD for:  persistant dizziness or light-headedness   Complete by: As directed    Call MD for:  temperature >100.4   Complete by: As directed    Diet general   Complete by: As directed  Discharge instructions   Complete by: As directed    Jimmy Fitzgerald, it has been a pleasure taking care of you. Your symptoms are most likely due to infection with Shigella/Enteroinvasive E Coli and Giardia lamblia. We are glad you are feeling better, and will discharge you with oral antibiotics.  You may not feel back to 100% for a few more days. Try to eat foods that will not upset your stomach, and try your best to stay hydrated.  1) continue Ciprofloxacin, twice a day for 1 more day 2) continue Flagyl, three times a day for 3 more days  3) make an appointment with Infectious Disease within the next week 4) make an appointment with one of the potential PCPs that  you have lined up within the next week   Increase activity slowly   Complete by: As directed       Signed: Remo Lipps, MD 11/04/2019, 3:37 PM   Pager: [phone]

## 2019-11-03 NOTE — Progress Notes (Signed)
0100 Patient c/o not able to urinate the entire day, per patient he has the urge but can't go, this RN did a bladder scan 7x with all 0 cc output and thought it's inaccurate so notified MD since pt haven't urinated the entire day. MD ordered in and out cath and when this RN was about to start with all materials out,  patient requested if he can have more time to urinate, MD notified again.

## 2019-11-04 LAB — BASIC METABOLIC PANEL
Anion gap: 7 (ref 5–15)
BUN: 10 mg/dL (ref 6–20)
CO2: 25 mmol/L (ref 22–32)
Calcium: 8.5 mg/dL — ABNORMAL LOW (ref 8.9–10.3)
Chloride: 107 mmol/L (ref 98–111)
Creatinine, Ser: 1.2 mg/dL (ref 0.61–1.24)
GFR calc Af Amer: >60 mL/min (ref >60)
GFR calc non Af Amer: >60 mL/min (ref >60)
Glucose, Bld: 116 mg/dL — ABNORMAL HIGH (ref 70–99)
Potassium: 4.1 mmol/L (ref 3.5–5.1)
Sodium: 139 mmol/L (ref 135–145)

## 2019-11-04 LAB — CBC
HCT: 38.6 % — ABNORMAL LOW (ref 39.0–52.0)
Hemoglobin: 12.3 g/dL — ABNORMAL LOW (ref 13.0–17.0)
MCH: 29.1 pg (ref 26.0–34.0)
MCHC: 31.9 g/dL (ref 30.0–36.0)
MCV: 91.5 fL (ref 80.0–100.0)
Platelets: 134 10*3/uL — ABNORMAL LOW (ref 150–400)
RBC: 4.22 MIL/uL (ref 4.22–5.81)
RDW: 13.2 % (ref 11.5–15.5)
WBC: 3.8 10*3/uL — ABNORMAL LOW (ref 4.0–10.5)
nRBC: 0 % (ref 0.0–0.2)

## 2019-11-04 MED ORDER — LACTATED RINGERS IV SOLN: INTRAVENOUS | Status: AC

## 2019-11-04 MED ORDER — CIPROFLOXACIN HCL 500 MG PO TABS
500.0000 mg | ORAL_TABLET | Freq: Two times a day (BID) | ORAL | 0 refills | Status: DC
Start: 2019-11-04 — End: 2021-05-20

## 2019-11-04 MED ORDER — METRONIDAZOLE 250 MG PO TABS: 250.0000 mg | ORAL_TABLET | Freq: Three times a day (TID) | ORAL | 0 refills | Status: AC

## 2019-11-04 NOTE — Progress Notes (Signed)
° °  Subjective:  Patient reports that he still has low appetite, feels that he cannot drink or eat very much. Denies nausea or pain, but states that he starts to feel full early. Reports some continued abdominal discomfort. He had good urine output last night, though he feels like he has to "push" more initially than he normally has to. Has not had any more diarrhea in past 2 days. He says he is still having some abdominal discomfort "but not pain." He has been getting PRN dilaudid but states that he feels it is no longer necessary.   Objective:  Vital signs in last 24 hours: Vitals:   11/03/19 0745 11/03/19 1324 11/03/19 2056 11/04/19 0518  BP: 133/89 (!) 141/85 133/77 (!) 129/95  Pulse: 64 64 (!) 54 (!) 57  Resp: 18  18 17   Temp: 98.7 F (37.1 C) 99 F (37.2 C) 98.8 F (37.1 C) 98.2 F (36.8 C)  TempSrc: Oral Oral Oral Oral  SpO2: 98% 98% 100% 98%  Weight:      Height:       Weight change:   Intake/Output Summary (Last 24 hours) at 11/04/2019 1107 Last data filed at 11/04/2019 0600 Gross per 24 hour  Intake 1431.67 ml  Output 900 ml  Net 531.67 ml   Physical Exam Constitutional:      General: He is not in acute distress. Cardiovascular:     Rate and Rhythm: Normal rate and regular rhythm.     Heart sounds: Normal heart sounds.  Pulmonary:     Effort: Pulmonary effort is normal.     Breath sounds: Normal breath sounds.  Neurological:     Mental Status: He is alert.      Assessment/Plan:  Active Problems:   HIV (human immunodeficiency virus infection) (HCC)   Dysentery   AKI (acute kidney injury) (HCC)   Colitis, acute  Infectious colitis 2/2 giardia, enteroaggregative E coli, enterotoxigenic E coli, and Shigella/Enteroinvasive E coli: Continues to improve. No diarrhea or blood per rectum for >48 hours. Abdominal discomfort subsiding. Tolerating PO antibiotics.  - Day 4/5 of ciprofloxacin per ID recommendation. - Day 4/7 of metronidazole per ID recommendation. -  Full diet.  Decreased urine output: Much improved from yesterday. Produced 900cc overnight plus one other unmeasured occurrence. Probable that his difficulty initiating a stream is due to the dilaudid he has been getting. - D/c dilaudid - Continue to monitor urine output - Encourage PO fluids - Reassess this afternoon for possible discharge  AKI: Creatinine bumped slightly today. 1.42-->1.34-->1.09-->1.20. Likely due to period of poor PO intake yesterday without IV supplementation. IV fluids off since midnight. - Restart mIVF  - Encouraged increased PO fluid intake  HIV: Last testing on 10/07/19 with HIV RNA<20, CD4 count 243. - Continue home Biktarvy - Bactrim prophylaxis not indicated with CD4 > 200.    LOS: 3 days   12/07/19, Medical Student 11/04/2019, 11:07 AM

## 2019-11-08 ENCOUNTER — Ambulatory Visit: Payer: Managed Care, Other (non HMO) | Admitting: Internal Medicine

## 2019-11-09 ENCOUNTER — Encounter: Payer: Self-pay | Admitting: Internal Medicine

## 2019-12-08 ENCOUNTER — Inpatient Hospital Stay: Payer: Medicaid Other | Admitting: Internal Medicine

## 2019-12-14 ENCOUNTER — Inpatient Hospital Stay: Payer: Medicaid Other | Admitting: Internal Medicine

## 2020-01-10 ENCOUNTER — Ambulatory Visit: Payer: Medicaid Other | Admitting: Internal Medicine

## 2020-02-08 ENCOUNTER — Encounter (HOSPITAL_COMMUNITY): Payer: Self-pay

## 2020-02-08 ENCOUNTER — Other Ambulatory Visit: Payer: Self-pay

## 2020-02-08 ENCOUNTER — Emergency Department (HOSPITAL_COMMUNITY)
Admission: EM | Admit: 2020-02-08 | Discharge: 2020-02-08 | Disposition: A | Discharge status: Home / Self Care | Payer: Medicaid Other | Attending: Emergency Medicine | Admitting: Emergency Medicine

## 2020-02-08 ENCOUNTER — Emergency Department (HOSPITAL_COMMUNITY): Payer: Medicaid Other

## 2020-02-08 DIAGNOSIS — M25511 Pain in right shoulder: Secondary | ICD-10-CM | POA: Diagnosis not present

## 2020-02-08 DIAGNOSIS — R0789 Other chest pain: Secondary | ICD-10-CM

## 2020-02-08 DIAGNOSIS — I10 Essential (primary) hypertension: Secondary | ICD-10-CM | POA: Diagnosis not present

## 2020-02-08 DIAGNOSIS — R000 Tachycardia, unspecified: Secondary | ICD-10-CM | POA: Diagnosis not present

## 2020-02-08 DIAGNOSIS — R079 Chest pain, unspecified: Secondary | ICD-10-CM | POA: Diagnosis present

## 2020-02-08 DIAGNOSIS — J4521 Mild intermittent asthma with (acute) exacerbation: Secondary | ICD-10-CM | POA: Diagnosis not present

## 2020-02-08 LAB — CBC
HCT: 45.7 % (ref 39.0–52.0)
Hemoglobin: 14.8 g/dL (ref 13.0–17.0)
MCH: 29.6 pg (ref 26.0–34.0)
MCHC: 32.4 g/dL (ref 30.0–36.0)
MCV: 91.4 fL (ref 80.0–100.0)
Platelets: 152 10*3/uL (ref 150–400)
RBC: 5 MIL/uL (ref 4.22–5.81)
RDW: 13.2 % (ref 11.5–15.5)
WBC: 5.4 10*3/uL (ref 4.0–10.5)
nRBC: 0 % (ref 0.0–0.2)

## 2020-02-08 LAB — BASIC METABOLIC PANEL
Anion gap: 12 (ref 5–15)
BUN: 15 mg/dL (ref 6–20)
CO2: 19 mmol/L — ABNORMAL LOW (ref 22–32)
Calcium: 8.7 mg/dL — ABNORMAL LOW (ref 8.9–10.3)
Chloride: 110 mmol/L (ref 98–111)
Creatinine, Ser: 1.01 mg/dL (ref 0.61–1.24)
GFR, Estimated: >60 mL/min (ref >60)
Glucose, Bld: 135 mg/dL — ABNORMAL HIGH (ref 70–99)
Potassium: 3.8 mmol/L (ref 3.5–5.1)
Sodium: 141 mmol/L (ref 135–145)

## 2020-02-08 LAB — TROPONIN I (HIGH SENSITIVITY)
Troponin I (High Sensitivity): 2 ng/L (ref <18)
Troponin I (High Sensitivity): <2 ng/L (ref <18)

## 2020-02-08 MED ORDER — HYDROCODONE-ACETAMINOPHEN 5-325 MG PO TABS
1.0000 | ORAL_TABLET | Freq: Once | ORAL | Status: AC
  Administered 2020-02-08: 12:00:00 1 via ORAL
  Filled 2020-02-08: qty 1

## 2020-02-08 MED ORDER — METHOCARBAMOL 500 MG PO TABS
500.0000 mg | ORAL_TABLET | Freq: Once | ORAL | Status: AC
  Administered 2020-02-08: 10:00:00 500 mg via ORAL
  Filled 2020-02-08: qty 1

## 2020-02-08 MED ORDER — ALBUTEROL SULFATE HFA 108 (90 BASE) MCG/ACT IN AERS
2.0000 | INHALATION_SPRAY | Freq: Once | RESPIRATORY_TRACT | Status: AC
  Administered 2020-02-08: 12:00:00 2 via RESPIRATORY_TRACT
  Filled 2020-02-08: qty 6.7

## 2020-02-08 MED ORDER — HYDROCODONE-ACETAMINOPHEN 5-325 MG PO TABS
1.0000 | ORAL_TABLET | Freq: Once | ORAL | Status: AC
  Administered 2020-02-08: 10:00:00 1 via ORAL
  Filled 2020-02-08: qty 1

## 2020-02-08 MED ORDER — PREDNISONE 20 MG PO TABS
60.0000 mg | ORAL_TABLET | Freq: Once | ORAL | Status: AC
  Administered 2020-02-08: 12:00:00 60 mg via ORAL
  Filled 2020-02-08: qty 3

## 2020-02-08 MED ORDER — METHOCARBAMOL 500 MG PO TABS: 500.0000 mg | ORAL_TABLET | Freq: Two times a day (BID) | ORAL | 0 refills | Status: AC

## 2020-02-08 MED ORDER — PREDNISONE 10 MG PO TABS: 40.0000 mg | ORAL_TABLET | Freq: Every day | ORAL | 0 refills | Status: AC

## 2020-02-08 NOTE — Discharge Instructions (Signed)
Take the medications to help with your symptoms. Take the steroids beginning tomorrow. Use your albuterol inhaler as needed. Return to the ER if you start to experience worsening pain, shortness of breath, leg swelling or fever.

## 2020-02-08 NOTE — ED Triage Notes (Signed)
Pt sts right sided chest pain that radiates to the right shoulder going on for 24 hours.

## 2020-02-08 NOTE — ED Notes (Signed)
RN made aware of patients BP and that patient stated he was having some wheezing since he got here today.

## 2020-02-08 NOTE — ED Provider Notes (Signed)
[facility]-EMERGENCY DEPT Provider Note   CSN: [id number] Arrival date & time: 02/08/20  0158     History Chief Complaint  Patient presents with  . Chest Pain    Jimmy Fitzgerald is a 29 y.o. male with a past medical history of HIV on antiretrovirals, asthma presenting to the ED with a chief complaint of right shoulder pain and chest pain.  Approximately 2 days ago developed intermittent right upper chest pain radiating to the right arm.  Pain has been constant for the past day.  He has not taken any medications to help with symptoms.  Reports pain is worse with movement of the right arm.  Reports some shortness of breath secondary to the pain.  Denies any cough, fever, abdominal pain, nausea, vomiting, injury or trauma.  No prior shoulder procedures.  No leg swelling, DVT, PE or recent immobilization.  HPI     Past Medical History:  Diagnosis Date  . Asthma   . HIV positive [facility])     Patient Active Problem List   Diagnosis Date Noted  . Dysentery 11/01/2019  . AKI (acute kidney injury) (HCC) 11/01/2019  . Colitis, acute 11/01/2019  . Depression 10/08/2019  . Shingles outbreak 10/08/2019  . At risk for opportunistic infections 10/08/2019  . Routine screening for STI (sexually transmitted infection) 08/18/2019  . Peripheral neuropathy 07/06/2019  . History of syphilis 07/06/2019  . Hypertension 07/06/2019  . Anemia 07/06/2019  . HIV (human immunodeficiency virus infection) (HCC) 08/19/2018  . 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 No pertinent surgical history.     Family History  Problem Relation Age of Onset  . Diabetes Maternal Grandmother   . Diabetes Maternal Grandfather   . Heart attack Paternal Grandmother   . Breast cancer Maternal Aunt   . Crohn's disease Cousin   . Crohn's disease Cousin     Social History   Tobacco Use  . Smoking status: Never Smoker  . Smokeless tobacco: Never Used  Substance Use Topics  . Drug  use: Not Currently    Home Medications Prior to Admission medications   Medication Sig Start Date End Date Taking? Authorizing Provider  albuterol (VENTOLIN HFA) 108 (90 Base) MCG/ACT inhaler Inhale 1-2 puffs into the lungs every 6 (six) hours as needed for wheezing or shortness of breath.   Yes [provider]  BIKTARVY 50-200-25 MG TABS tablet Take 1 tablet by mouth daily. 10/08/19  Yes Comer, Belia Heman, MD  ciprofloxacin (CIPRO) 500 MG tablet Take 1 tablet (500 mg total) by mouth 2 (two) times daily. Patient not taking: Reported on 02/08/2020 11/04/19   Remo Lipps, MD  gabapentin (NEURONTIN) 300 MG capsule Take 1 capsule (300 mg total) by mouth 3 (three) times daily. Patient not taking: Reported on 02/08/2020 09/15/19   Fayrene Helper, PA-C  methocarbamol (ROBAXIN) 500 MG tablet Take 1 tablet (500 mg total) by mouth 2 (two) times daily. 02/08/20   Orene Abbasi, PA-C  predniSONE (DELTASONE) 10 MG tablet Take 4 tablets (40 mg total) by mouth daily for 4 days. 02/08/20 02/12/20  Dietrich Pates, PA-C    Allergies    Patient has no known allergies.  Review of Systems   Review of Systems  Constitutional: Negative for appetite change, chills and fever.  HENT: Negative for ear pain, rhinorrhea, sneezing and sore throat.   Eyes: Negative for photophobia and visual disturbance.  Respiratory: Negative for cough, chest tightness, shortness of breath and wheezing.   Cardiovascular: Positive  for chest pain. Negative for palpitations.  Gastrointestinal: Negative for abdominal pain, blood in stool, constipation, diarrhea, nausea and vomiting.  Genitourinary: Negative for dysuria, hematuria and urgency.  Musculoskeletal: Positive for arthralgias and myalgias.  Skin: Negative for rash.  Neurological: Negative for dizziness, weakness and light-headedness.    Physical Exam Updated Vital Signs BP (!) 146/100   Pulse 63   Temp 97.8 F (36.6 C) (Oral)   Resp 14   Ht 5\' 11"  (1.803 m)   Wt 83  kg   SpO2 98%   BMI 25.52 kg/m   Physical Exam Vitals and nursing note reviewed.  Constitutional:      General: He is not in acute distress.    Appearance: He is well-developed and well-nourished.     Comments: Speaking complete sentences without difficulty.  HENT:     Head: Normocephalic and atraumatic.     Nose: Nose normal.  Eyes:     General: No scleral icterus.       Right eye: No discharge.        Left eye: No discharge.     Extraocular Movements: EOM normal.     Conjunctiva/sclera: Conjunctivae normal.  Cardiovascular:     Rate and Rhythm: Normal rate and regular rhythm.     Pulses: Intact distal pulses.     Heart sounds: Normal heart sounds. No murmur heard. No friction rub. No gallop.   Pulmonary:     Effort: Pulmonary effort is normal. No respiratory distress.     Breath sounds: Normal breath sounds.  Chest:     Chest wall: Tenderness present.    Abdominal:     General: Bowel sounds are normal. There is no distension.     Palpations: Abdomen is soft.     Tenderness: There is no abdominal tenderness. There is no guarding.  Musculoskeletal:        General: No edema. Normal range of motion.     Cervical back: Normal range of motion and neck supple.     Right lower leg: No edema.     Left lower leg: No edema.     Comments: No erythema, edema or warmth of shoulder.  No deformities.  2+ radial pulse palpated bilaterally.  Pain with overhead reaching. No lower extremity edema, erythema or calf tenderness bilaterally.  Skin:    General: Skin is warm and dry.     Findings: No rash.  Neurological:     Mental Status: He is alert.     Motor: No abnormal muscle tone.     Coordination: Coordination normal.  Psychiatric:        Mood and Affect: Mood and affect normal.     ED Results / Procedures / Treatments   Labs (all labs ordered are listed, but only abnormal results are displayed) Labs Reviewed  BASIC METABOLIC PANEL - Abnormal; Notable for the following  components:      Result Value   CO2 19 (*)    Glucose, Bld 135 (*)    Calcium 8.7 (*)    All other components within normal limits  CBC  TROPONIN I (HIGH SENSITIVITY)  TROPONIN I (HIGH SENSITIVITY)    EKG EKG Interpretation  Date/Time:  Tuesday February 08 2020 02:29:05 EST Ventricular Rate:  104 PR Interval:    QRS Duration: 77 QT Interval:  323 QTC Calculation: 425 R Axis:   29 Text Interpretation: Sinus tachycardia Right atrial enlargement Borderline T wave abnormalities No significant change since 11/01/2019 Confirmed by 01/01/2020 (  [id number]) on 02/08/2020 2:36:02 AM   Radiology DG Chest 2 View  Result Date: 02/08/2020 CLINICAL DATA:  Chest pain, shortness of breath EXAM: CHEST - 2 VIEW COMPARISON:  12/12/2018 FINDINGS: Heart and mediastinal contours are within normal limits. No focal opacities or effusions. No acute bony abnormality. IMPRESSION: No active cardiopulmonary disease. Electronically Signed   By: Charlett NoseKevin  Dover M.D.   On: 02/08/2020 02:46    Procedures Procedures (including critical care time)  Medications Ordered in ED Medications  methocarbamol (ROBAXIN) tablet 500 mg (500 mg Oral Given 02/08/20 0931)  HYDROcodone-acetaminophen (NORCO/VICODIN) 5-325 MG per tablet 1 tablet (1 tablet Oral Given 02/08/20 0931)  albuterol (VENTOLIN HFA) 108 (90 Base) MCG/ACT inhaler 2 puff (2 puffs Inhalation Given 02/08/20 1153)  predniSONE (DELTASONE) tablet 60 mg (60 mg Oral Given 02/08/20 1151)  HYDROcodone-acetaminophen (NORCO/VICODIN) 5-325 MG per tablet 1 tablet (1 tablet Oral Given 02/08/20 1152)    ED Course  I have reviewed the triage vital signs and the nursing notes.  Pertinent labs & imaging results that were available during my care of the patient were reviewed by me and considered in my medical decision making (see chart for details).    MDM Rules/Calculators/A&P                          29 year old male with past medical history of HIV on antiretrovirals,  asthma presenting to the ED with a chief complaint of right shoulder pain and chest pain.  Symptoms began 2 days ago.  Reports right upper chest pain radiating to the right arm.  Does report some wheezing but denies any shortness of breath, cough, fever.  On exam he has tenderness of the right upper chest wall.  Feels a pulling sensation in this area when he raises his arms.  Normal range of motion of joints without any abnormalities of the joints visually.  Oxygen saturations 98% on room air.  Patient with faint end expiratory wheezing in bilateral lower lung fields.  No lower extremity edema, erythema or calf tenderness that would concern me for DVT.  EKG shows sinus tachycardia, no changes from prior tracings.  Chest x-ray is unremarkable.  CBC, BMP, initial delta troponin are both unremarkable.  Patient given medications here including muscle relaxer, Norco, albuterol and prednisone with significant improvement in his symptoms.  Suspect musculoskeletal chest wall pain but also needs to be treated for an asthma exacerbation.  Will treat with steroids, muscle relaxer and gave him a refill of his albuterol.  He is PERC negative and low risk by heart score for ACS.  Suspect viral cause.  Return precautions given.   Patient is hemodynamically stable, in NAD, and able to ambulate in the ED. Evaluation does not show pathology that would require ongoing emergent intervention or inpatient treatment. I explained the diagnosis to the patient. Pain has been managed and has no complaints prior to discharge. Patient is comfortable with above plan and is stable for discharge at this time. All questions were answered prior to disposition. Strict return precautions for returning to the ED were discussed. Encouraged follow up with PCP.   An After Visit Summary was printed and given to the patient.   Portions of this note were generated with Scientist, clinical (histocompatibility and immunogenetics)Dragon dictation software. Dictation errors may occur despite best attempts at  proofreading.  Final Clinical Impression(s) / ED Diagnoses Final diagnoses:  Chest wall pain  Mild intermittent asthma with exacerbation    Rx / DC Orders  ED Discharge Orders         Ordered    predniSONE (DELTASONE) 10 MG tablet  Daily        02/08/20 1308    methocarbamol (ROBAXIN) 500 MG tablet  2 times daily        02/08/20 1308           Dietrich Pates, PA-C 02/08/20 1309    Sabino Donovan, MD 02/08/20 1351

## 2020-02-08 NOTE — ED Notes (Signed)
RN made aware patient is complaining of sharp pain in his chest.

## 2020-03-18 ENCOUNTER — Other Ambulatory Visit: Payer: Self-pay

## 2020-03-18 ENCOUNTER — Emergency Department (HOSPITAL_COMMUNITY)
Admission: EM | Admit: 2020-03-18 | Discharge: 2020-03-18 | Disposition: A | Discharge status: Home / Self Care | Payer: Medicaid Other | Attending: Emergency Medicine | Admitting: Emergency Medicine

## 2020-03-18 ENCOUNTER — Encounter (HOSPITAL_COMMUNITY): Payer: Self-pay

## 2020-03-18 DIAGNOSIS — J45909 Unspecified asthma, uncomplicated: Secondary | ICD-10-CM | POA: Insufficient documentation

## 2020-03-18 DIAGNOSIS — I10 Essential (primary) hypertension: Secondary | ICD-10-CM | POA: Insufficient documentation

## 2020-03-18 DIAGNOSIS — L02212 Cutaneous abscess of back [any part, except buttock]: Secondary | ICD-10-CM | POA: Diagnosis not present

## 2020-03-18 DIAGNOSIS — L0231 Cutaneous abscess of buttock: Secondary | ICD-10-CM | POA: Insufficient documentation

## 2020-03-18 DIAGNOSIS — L0291 Cutaneous abscess, unspecified: Secondary | ICD-10-CM

## 2020-03-18 LAB — CBC WITH DIFFERENTIAL/PLATELET
Abs Immature Granulocytes: 0.02 10*3/uL (ref 0.00–0.07)
Basophils Absolute: 0 10*3/uL (ref 0.0–0.1)
Basophils Relative: 0 %
Eosinophils Absolute: 0.1 10*3/uL (ref 0.0–0.5)
Eosinophils Relative: 2 %
HCT: 44.6 % (ref 39.0–52.0)
Hemoglobin: 14.4 g/dL (ref 13.0–17.0)
Immature Granulocytes: 0 %
Lymphocytes Relative: 25 %
Lymphs Abs: 1.9 10*3/uL (ref 0.7–4.0)
MCH: 29.9 pg (ref 26.0–34.0)
MCHC: 32.3 g/dL (ref 30.0–36.0)
MCV: 92.5 fL (ref 80.0–100.0)
Monocytes Absolute: 0.8 10*3/uL (ref 0.1–1.0)
Monocytes Relative: 11 %
Neutro Abs: 4.8 10*3/uL (ref 1.7–7.7)
Neutrophils Relative %: 62 %
Platelets: 164 10*3/uL (ref 150–400)
RBC: 4.82 MIL/uL (ref 4.22–5.81)
RDW: 12.5 % (ref 11.5–15.5)
WBC: 7.7 10*3/uL (ref 4.0–10.5)
nRBC: 0 % (ref 0.0–0.2)

## 2020-03-18 LAB — BASIC METABOLIC PANEL
Anion gap: 10 (ref 5–15)
BUN: 7 mg/dL (ref 6–20)
CO2: 21 mmol/L — ABNORMAL LOW (ref 22–32)
Calcium: 8.8 mg/dL — ABNORMAL LOW (ref 8.9–10.3)
Chloride: 106 mmol/L (ref 98–111)
Creatinine, Ser: 1.04 mg/dL (ref 0.61–1.24)
GFR, Estimated: >60 mL/min (ref >60)
Glucose, Bld: 117 mg/dL — ABNORMAL HIGH (ref 70–99)
Potassium: 4.1 mmol/L (ref 3.5–5.1)
Sodium: 137 mmol/L (ref 135–145)

## 2020-03-18 MED ORDER — CLINDAMYCIN HCL 150 MG PO CAPS
300.0000 mg | ORAL_CAPSULE | Freq: Once | ORAL | Status: AC
  Administered 2020-03-18: 300 mg via ORAL
  Filled 2020-03-18: qty 2

## 2020-03-18 MED ORDER — CLINDAMYCIN HCL 150 MG PO CAPS: 150.0000 mg | ORAL_CAPSULE | Freq: Four times a day (QID) | ORAL | 0 refills | Status: AC

## 2020-03-18 MED ORDER — CLINDAMYCIN HCL 150 MG PO CAPS: 150.0000 mg | ORAL_CAPSULE | Freq: Four times a day (QID) | ORAL | 0 refills | Status: DC

## 2020-03-18 NOTE — Discharge Instructions (Signed)
Please return for any problem.  °

## 2020-03-18 NOTE — ED Notes (Signed)
Pt d/c home per MD order. Discharge summary reviewed with pt, pt verbalizes understanding. Ambulatory off unit. No s/s of acute distress noted.  

## 2020-03-18 NOTE — ED Provider Notes (Signed)
[facility] EMERGENCY DEPARTMENT Provider Note   CSN: [id number] Arrival date & time: 03/18/20  1308     History Chief Complaint  Patient presents with  . Rash    Jimmy Fitzgerald is a 30 y.o. male.  30 year old male with prior medical history as detailed below presents for evaluation of abscess.  Patient reports that he has 2 superficial skin abscesses.  One is on the left buttock.  This has opened and started to drain.  He also has a very small one on his right low back.  This is not yet draining.  He denies fever.  He reports that the one on the left buttock began approximately 4 days ago.  Patient noticed the one on his right low back yesterday.  He reports that he was shaving and suspects that his razor may have caused the abscess.  He denies fever.  He denies other complaint.  The history is provided by the patient and medical records.  Abscess Abscess location: Left buttock and right low back. Abscess quality: draining and redness   Abscess quality comment:  Left buttock abscess that is draining, right low back abscess is without fluctuance or drainage. Duration:  3 days Progression:  Partially resolved Chronicity:  New Relieved by:  Nothing Worsened by:  Nothing      Past Medical History:  Diagnosis Date  . Asthma   . HIV positive [facility])     Patient Active Problem List   Diagnosis Date Noted  . Dysentery 11/01/2019  . AKI (acute kidney injury) (HCC) 11/01/2019  . Colitis, acute 11/01/2019  . Depression 10/08/2019  . Shingles outbreak 10/08/2019  . At risk for opportunistic infections 10/08/2019  . Routine screening for STI (sexually transmitted infection) 08/18/2019  . Peripheral neuropathy 07/06/2019  . History of syphilis 07/06/2019  . Hypertension 07/06/2019  . Anemia 07/06/2019  . HIV (human immunodeficiency virus infection) (HCC) 08/19/2018  . 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 No pertinent surgical  history.     Family History  Problem Relation Age of Onset  . Diabetes Maternal Grandmother   . Diabetes Maternal Grandfather   . Heart attack Paternal Grandmother   . Breast cancer Maternal Aunt   . Crohn's disease Cousin   . Crohn's disease Cousin     Social History   Tobacco Use  . Smoking status: Never Smoker  . Smokeless tobacco: Never Used  Substance Use Topics  . Drug use: Not Currently    Home Medications Prior to Admission medications   Medication Sig Start Date End Date Taking? Authorizing Provider  albuterol (VENTOLIN HFA) 108 (90 Base) MCG/ACT inhaler Inhale 1-2 puffs into the lungs every 6 (six) hours as needed for wheezing or shortness of breath.    [provider]  BIKTARVY 50-200-25 MG TABS tablet Take 1 tablet by mouth daily. 10/08/19   Comer, Belia Heman, MD  ciprofloxacin (CIPRO) 500 MG tablet Take 1 tablet (500 mg total) by mouth 2 (two) times daily. Patient not taking: Reported on 02/08/2020 11/04/19   Remo Lipps, MD  gabapentin (NEURONTIN) 300 MG capsule Take 1 capsule (300 mg total) by mouth 3 (three) times daily. Patient not taking: Reported on 02/08/2020 09/15/19   Fayrene Helper, PA-C  methocarbamol (ROBAXIN) 500 MG tablet Take 1 tablet (500 mg total) by mouth 2 (two) times daily. 02/08/20   Dietrich Pates, PA-C    Allergies    Patient has no known allergies.  Review of Systems  Review of Systems  All other systems reviewed and are negative.   Physical Exam Updated Vital Signs BP (!) 148/99 (BP Location: Right Arm)   Pulse 78   Temp 98.4 F (36.9 C)   Resp 15   Ht 5\' 9"  (1.753 m)   Wt 77.1 kg   SpO2 98%   BMI 25.10 kg/m   Physical Exam Vitals and nursing note reviewed.  Constitutional:      General: He is not in acute distress.    Appearance: He is well-developed and well-nourished.  HENT:     Head: Normocephalic and atraumatic.     Mouth/Throat:     Mouth: Oropharynx is clear and moist.  Eyes:     Extraocular Movements:  EOM normal.     Conjunctiva/sclera: Conjunctivae normal.     Pupils: Pupils are equal, round, and reactive to light.  Cardiovascular:     Rate and Rhythm: Normal rate and regular rhythm.     Heart sounds: Normal heart sounds.  Pulmonary:     Effort: Pulmonary effort is normal. No respiratory distress.     Breath sounds: Normal breath sounds.  Abdominal:     General: There is no distension.     Palpations: Abdomen is soft.     Tenderness: There is no abdominal tenderness.  Musculoskeletal:        General: No deformity or edema. Normal range of motion.     Cervical back: Normal range of motion and neck supple.  Skin:    General: Skin is warm and dry.     Comments: Draining small superficial skin abscess on the left buttock.  No surrounding erythema or signs of infection.  Superficial small early abscess noted to the right low back.  No fluctuance.  No suggestion of drainable abscess at this time.  Neurological:     Mental Status: He is alert and oriented to person, place, and time.  Psychiatric:        Mood and Affect: Mood and affect normal.     ED Results / Procedures / Treatments   Labs (all labs ordered are listed, but only abnormal results are displayed) Labs Reviewed  BASIC METABOLIC PANEL - Abnormal; Notable for the following components:      Result Value   CO2 21 (*)    Glucose, Bld 117 (*)    Calcium 8.8 (*)    All other components within normal limits  CBC WITH DIFFERENTIAL/PLATELET    EKG None  Radiology No results found.  Procedures Procedures (including critical care time)  Medications Ordered in ED Medications  clindamycin (CLEOCIN) capsule 300 mg (has no administration in time range)    ED Course  I have reviewed the triage vital signs and the nursing notes.  Pertinent labs & imaging results that were available during my care of the patient were reviewed by me and considered in my medical decision making (see chart for details).    MDM  Rules/Calculators/A&P                          MDM  Screen complete  Jimmy Fitzgerald was evaluated in Emergency Department on 03/18/2020 for the symptoms described in the history of present illness. He was evaluated in the context of the global COVID-19 pandemic, which necessitated consideration that the patient might be at risk for infection with the SARS-CoV-2 virus that causes COVID-19. Institutional protocols and algorithms that pertain to the evaluation of patients at risk  for COVID-19 are in a state of rapid change based on information released by regulatory bodies including the CDC and federal and state organizations. These policies and algorithms were followed during the patient's care in the ED.   Patient is presenting with evidence of two facial skin abscesses.  The one on the left buttock is already draining.  You may have an early one developing in his right low back.  There is no discrete fluctuance noted on the right low back.  Patient was offered attempted I&D for his lesion on the right low back.  He declined same.  He is agreeable with plan with outpatient antibiotics.  He will apply heat.  Patient understands need for return if symptoms worsen or fail to improve.  He understands that he may need I&D if the lesion on his right low back does not come to ahead and drain on its own.   Final Clinical Impression(s) / ED Diagnoses Final diagnoses:  Abscess    Rx / DC Orders ED Discharge Orders         Ordered    clindamycin (CLEOCIN) 150 MG capsule  Every 6 hours        03/18/20 1734           Wynetta Fines, MD 03/18/20 1736

## 2020-03-18 NOTE — ED Triage Notes (Signed)
Pt presents with a rash to his Right lower back and Left buttocks x6 days. Pt reports several people at [employer] [address] have the same rash

## 2020-06-14 ENCOUNTER — Telehealth: Payer: Self-pay

## 2020-06-14 NOTE — Telephone Encounter (Signed)
Medication Samples have been provided to the patient.  Drug name: Biktarvy       Strength: 50-200-25 MG        Qty: 7  LOT: CHSYVA  Exp.Date: 07/2022  Dosing instructions:  Take  1 tablet daily  The patient has been instructed regarding the correct time, dose, and frequency of taking this medication, including desired effects and most common side effects.   Bobette Mo 2:08 PM 06/14/2020

## 2020-06-16 ENCOUNTER — Encounter: Payer: Self-pay | Admitting: Internal Medicine

## 2020-06-19 ENCOUNTER — Telehealth: Payer: Self-pay

## 2020-06-19 NOTE — Telephone Encounter (Signed)
Medication Samples have been provided to the patient.  Drug name: Biktarvy        Strength: 50/200/25 mg       Qty: 7   LOT: [id number]   Exp.Date: 07/2022  Dosing instructions: Take one tablet by mouth once daily  The patient has been instructed regarding the correct time, dose, and frequency of taking this medication, including desired effects and most common side effects.   Cassie L. Jannette Fogo, PharmD, BCIDP, AAHIVP, CPP Clinical Pharmacist Practitioner Infectious Diseases Clinical Pharmacist [facility] 02/07/2020, 10:07 AM

## 2020-06-26 ENCOUNTER — Encounter: Payer: Self-pay | Admitting: Pharmacist

## 2020-06-26 NOTE — Progress Notes (Signed)
Medication Samples have been provided to the patient.  Drug name: Biktarvy        Strength: 50/200/25 mg       Qty: 7 tablets (one bottle)  LOT: CHSYVA  Exp.Date: 07/27/2022  Dosing instructions: Take one tablet by mouth once daily  The patient has been instructed regarding the correct time, dose, and frequency of taking this medication, including desired effects and most common side effects.   Josefita Weissmann L. Jannette Fogo, PharmD, BCIDP, AAHIVP, CPP Clinical Pharmacist Practitioner Infectious Diseases Clinical Pharmacist [facility] 02/07/2020, 10:07 AM

## 2020-06-27 ENCOUNTER — Ambulatory Visit: Payer: Medicaid Other | Admitting: Internal Medicine

## 2020-06-29 ENCOUNTER — Other Ambulatory Visit: Payer: Medicaid Other

## 2020-07-14 ENCOUNTER — Ambulatory Visit: Payer: Medicaid Other | Admitting: Internal Medicine

## 2020-07-27 ENCOUNTER — Ambulatory Visit: Payer: Self-pay | Admitting: Infectious Diseases

## 2020-08-25 ENCOUNTER — Other Ambulatory Visit: Payer: Self-pay | Admitting: Internal Medicine

## 2020-08-31 ENCOUNTER — Ambulatory Visit: Payer: Medicaid Other

## 2020-10-19 ENCOUNTER — Ambulatory Visit (INDEPENDENT_AMBULATORY_CARE_PROVIDER_SITE_OTHER): Payer: Medicaid Other | Admitting: Physician Assistant

## 2020-10-19 ENCOUNTER — Other Ambulatory Visit: Payer: Self-pay

## 2020-10-19 VITALS — BP 139/97 | HR 102 | Temp 98.6°F | Ht 69.0 in | Wt 212.0 lb

## 2020-10-19 DIAGNOSIS — B04 Monkeypox: Secondary | ICD-10-CM | POA: Diagnosis not present

## 2020-10-19 MED ORDER — TPOXX 200 MG PO CAPS: 600.0000 mg | ORAL_CAPSULE | Freq: Two times a day (BID) | ORAL | 0 refills | Status: AC

## 2020-10-19 NOTE — Progress Notes (Signed)
Counseled patient on tecovirimat administration. Patient will take 3 capsules (600 mg) by mouth twice daily for 14 days. Gave 84 tablets to complete treatment. Counseled to take with a full, fatty meal of at least 600 calories and 25 gm of fat. Gave him a chart to monitor his lesions and document his doses. Counseled that the most common side effect is headaches. He will reach out if he has any questions or concerns.   

## 2020-10-19 NOTE — Progress Notes (Signed)
Subjective:    Patient ID: Jimmy Fitzgerald, male    DOB: 05-15-1990, 30 y.o.   MRN: [id number]  Chief Complaint  Patient presents with   Rash    Sent by [facility] for presumptive HMPX treatment     HPI:  Jimmy Fitzgerald is a 30 y.o. male who is HIV positive, last VL 10/07/2019 shows suppressed.  He was seen by [facility] 10/19/2020 for possible syphilis related rash.  Hadassah Pais NP referred to our clinic for treatment of presumptive HMPX. She completed swabs and STI visit and sent to state lab.  Pts history is consistent with record.  He has had no sexual acitivity, kissing, cuddling for 4 months.  Traveled to [place] for grandmother's birthday and only around family members who did have a dog.  He lives alone and has no pets. He has not been around anyone with similar rash.  Symptom onset began 10/16/2020 lesion on pinky finger and then has dissiminated further over the last several days to face, trunk, back, palms of hands, upper extremities.  He has no lesions along anogenital region. Denies rectal pain or blood in stool. He report "terrible" HA 10/15/2020 otherwise denies fever, sore throat or LAD. 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  + PMH asthma, no eczema    No Known Allergies    Outpatient Medications Prior to Visit  Medication Sig Dispense Refill   albuterol (VENTOLIN HFA) 108 (90 Base) MCG/ACT inhaler Inhale 1-2 puffs into the lungs every 6 (six) hours as needed for wheezing or shortness of breath.     BIKTARVY 50-200-25 MG TABS tablet Take 1 tablet by mouth daily. 30 tablet 11   ciprofloxacin (CIPRO) 500 MG tablet Take 1 tablet (500 mg total) by mouth 2 (two) times daily. (Patient not taking: Reported on 02/08/2020) 2 tablet 0   clindamycin (CLEOCIN) 150 MG capsule Take 1 capsule (150 mg total) by mouth every 6 (six) hours. 28 capsule 0   gabapentin (NEURONTIN) 300 MG capsule Take 1 capsule (300 mg total) by mouth 3 (three) times daily. (Patient not taking: Reported on 02/08/2020)  60 capsule 0   methocarbamol (ROBAXIN) 500 MG tablet Take 1 tablet (500 mg total) by mouth 2 (two) times daily. 20 tablet 0   No facility-administered medications prior to visit.     Past Medical History:  Diagnosis Date   Asthma    HIV positive (HCC)      No past surgical history on file.     Review of Systems  Constitutional:  Negative for appetite change, chills, fatigue and fever.  HENT:  Negative for congestion, mouth sores, rhinorrhea and sore throat.   Respiratory:  Negative for cough and wheezing.   Gastrointestinal: Negative.  Negative for abdominal pain, anal bleeding, blood in stool, constipation, diarrhea, nausea, rectal pain and vomiting.  Genitourinary:  Negative for difficulty urinating, genital sores, penile pain, scrotal swelling and testicular pain.  Musculoskeletal:  Negative for arthralgias and neck pain.  Skin:  Positive for rash.  Neurological:  Negative for dizziness, weakness, light-headedness and headaches.  Hematological:  Negative for adenopathy.  Psychiatric/Behavioral: Negative.       Objective:    BP (!) 139/97   Pulse (!) 102   Temp 98.6 F (37 C)   Ht 5\' 9"  (1.753 m)   Wt 212 lb (96.2 kg)   BMI 31.31 kg/m  Nursing note and vital signs reviewed.  Physical Exam Vitals reviewed.  Constitutional:      Appearance: Normal  appearance.  HENT:     Head: Normocephalic and atraumatic.  Eyes:     Extraocular Movements: Extraocular movements intact.     Conjunctiva/sclera: Conjunctivae normal.     Pupils: Pupils are equal, round, and reactive to light.  Cardiovascular:     Rate and Rhythm: Normal rate and regular rhythm.     Pulses: Normal pulses.     Heart sounds: Normal heart sounds.  Pulmonary:     Effort: Pulmonary effort is normal.     Breath sounds: Normal breath sounds.  Skin:    General: Skin is warm.     Findings: Lesion and rash present. Rash is papular, pustular and vesicular.     Nails: There is no clubbing.     Comments:  Face, trunk, back, extremities, upper extremities well demarcated 5 mm-10 mm papular and vesicular lesions dissiminated with erythema at margins. No exudate or d/c noted.  Neurological:     Mental Status: He is alert and oriented to person, place, and time.  Psychiatric:        Mood and Affect: Mood normal.        Behavior: Behavior normal.        Thought Content: Thought content normal.        Judgment: Judgment normal.        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  Reviewed Note by Karie Mainland NP.  Copy of consent provided to patient Instructed to take TPOXX 30 mins after high fatty caloric meal with water Continue to isolate at home Will follow up 10/26/2020 at 1:45 pm   Patient Active Problem List   Diagnosis Date Noted   Dysentery 11/01/2019   AKI (acute kidney injury) (HCC) 11/01/2019   Colitis, acute 11/01/2019   Depression 10/08/2019   Shingles outbreak 10/08/2019   At risk for opportunistic infections 10/08/2019   Routine screening for STI (sexually transmitted infection) 08/18/2019   Peripheral neuropathy 07/06/2019   History of syphilis 07/06/2019   Hypertension 07/06/2019   Anemia 07/06/2019   HIV (human immunodeficiency virus infection) (HCC) 08/19/2018   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 I am also having him maintain his gabapentin, Biktarvy, ciprofloxacin, albuterol, methocarbamol, and clindamycin.   Meds ordered this encounter  Medications   tecovirimat (TPOXX) capsule    Sig: Take 3 capsules (600 mg total) by mouth 2 (two) times daily with a meal for 14 days.    Dispense:  84 capsule    Refill:  0    Order Specific Question:   Supervising Provider    Answer:  VAN DAM, CORNELIUS N [[id number]]     Follow-up: Return in about 1 week (around 10/26/2020) for MPX.    ba

## 2020-10-19 NOTE — Patient Instructions (Addendum)
Follow up 10/26/2020 at 1:45 Maintain diary daily TPOXX twice daily 30 minutes after high fatty meal as instructed Notify close contacts Stay isolated Disinfect counters and linens

## 2020-10-26 ENCOUNTER — Encounter: Payer: Self-pay | Admitting: Physician Assistant

## 2020-10-26 ENCOUNTER — Other Ambulatory Visit: Payer: Self-pay

## 2020-10-26 ENCOUNTER — Ambulatory Visit (INDEPENDENT_AMBULATORY_CARE_PROVIDER_SITE_OTHER): Payer: Self-pay | Admitting: Physician Assistant

## 2020-10-26 VITALS — BP 130/80 | HR 70 | Temp 98.0°F | Resp 16

## 2020-10-26 DIAGNOSIS — B04 Monkeypox: Secondary | ICD-10-CM

## 2020-10-26 DIAGNOSIS — Z21 Asymptomatic human immunodeficiency virus [HIV] infection status: Secondary | ICD-10-CM

## 2020-10-26 MED ORDER — BIKTARVY 50-200-25 MG PO TABS: 1.0000 | ORAL_TABLET | Freq: Every day | ORAL | 3 refills | Status: DC

## 2020-10-26 NOTE — Patient Instructions (Signed)
Jimmy Fitzgerald everything is looking great you are doing all the right things!  Continue current TPOXX Follow up in 1 week as scheduled We can discuss your labs at that time and I have scheduled to follow up with you in 4 months Please bring diary at next appointment

## 2020-10-26 NOTE — Progress Notes (Signed)
Subjective:    Patient ID: Jimmy Fitzgerald, male    DOB: 10-24-1990, 30 y.o.   MRN: [id number]  Chief Complaint  Patient presents with   interim follow up on MPX treatment w TPOXX     HPI:  Jimmy Fitzgerald is a 30 y.o. male who presents today for follow up TPOXX treatment day 8.  He observed significant improvement at all lesion sites by 3rd dose of treatment. He has been adherent with TPOXX and tolerated well, does report "HA for 5 minutes on day 1 of treatment." He has been disinfecting at home and remaining isolated.  He states lesions are no longer red, most have scabbed over and fallen off entirely. No other symptoms to report.  HIV-he has been adherent to biktarvy.  His last follow VL 10/07/2019 was undetectable Cd4 243.  His mood is stable and reports family support.  He is working as a Leisure centre manager. He will need to complete renewal of Juanell Fairly at next appointment as it will expire at the end of this month. No Known Allergies    Outpatient Medications Prior to Visit  Medication Sig Dispense Refill   albuterol (VENTOLIN HFA) 108 (90 Base) MCG/ACT inhaler Inhale 1-2 puffs into the lungs every 6 (six) hours as needed for wheezing or shortness of breath.     ciprofloxacin (CIPRO) 500 MG tablet Take 1 tablet (500 mg total) by mouth 2 (two) times daily. (Patient not taking: Reported on 02/08/2020) 2 tablet 0   clindamycin (CLEOCIN) 150 MG capsule Take 1 capsule (150 mg total) by mouth every 6 (six) hours. 28 capsule 0   gabapentin (NEURONTIN) 300 MG capsule Take 1 capsule (300 mg total) by mouth 3 (three) times daily. (Patient not taking: Reported on 02/08/2020) 60 capsule 0   methocarbamol (ROBAXIN) 500 MG tablet Take 1 tablet (500 mg total) by mouth 2 (two) times daily. 20 tablet 0   tecovirimat (TPOXX) capsule Take 3 capsules (600 mg total) by mouth 2 (two) times daily with a meal for 14 days. 84 capsule 0   BIKTARVY 50-200-25 MG TABS tablet Take 1 tablet by mouth daily. 30 tablet 11   No  facility-administered medications prior to visit.     Past Medical History:  Diagnosis Date   Asthma    HIV positive (HCC)      History reviewed. No pertinent surgical history.     Review of Systems  Constitutional:  Negative for activity change, chills, fatigue and fever.  HENT:  Negative for congestion and sore throat.   Respiratory:  Negative for shortness of breath and wheezing.   Cardiovascular:  Negative for chest pain.  Gastrointestinal:  Negative for blood in stool, constipation, diarrhea, nausea, rectal pain and vomiting.  Genitourinary: Negative.   Skin:  Positive for rash (mpx lesions improved since last visit).  Neurological: Negative.   Hematological:  Negative for adenopathy.  Psychiatric/Behavioral: Negative.       Objective:    BP 130/80   Pulse 70   Temp 98 F (36.7 C)   Resp 16  Nursing note and vital signs reviewed.  Physical Exam Constitutional:      Appearance: Normal appearance.  HENT:     Head: Normocephalic and atraumatic.  Eyes:     Extraocular Movements: Extraocular movements intact.     Conjunctiva/sclera: Conjunctivae normal.     Pupils: Pupils are equal, round, and reactive to light.  Cardiovascular:     Rate and Rhythm: Normal rate and regular rhythm.  Heart sounds: Normal heart sounds.  Pulmonary:     Effort: Pulmonary effort is normal.     Breath sounds: Normal breath sounds.  Musculoskeletal:        General: Normal range of motion.     Cervical back: Normal range of motion and neck supple.  Lymphadenopathy:     Cervical: No cervical adenopathy.  Skin:    Comments: Lesions located on face, extremities, back and anterior chest appear to be in final stages with scabbing and new skin growth present at several sites. No signs of secondary infection.  Neurological:     Mental Status: He is alert.     Depression screen [facility] 2/9 10/07/2019 08/18/2019  Decreased Interest 3 3  Down, Depressed, Hopeless 3 3  PHQ - 2 Score 6 6   Altered sleeping 3 -  Tired, decreased energy 3 -  Change in appetite 3 -  Feeling bad or failure about yourself  3 3  Trouble concentrating 3 -  Moving slowly or fidgety/restless 1 -  Suicidal thoughts 3 2  PHQ-9 Score 25 -  Difficult doing work/chores Extremely dIfficult -       Assessment & Plan:  MPX-response to treatment is significant improvement, Continue TPOXX as instructed and follow up in 1 week Remain isolated at home Maintain diary Continue to disinfect surfaces and sterililze linenes  HIV-Labs today, refilled BIK, will renew RW next visit, mood is stable and he has excellent family support.  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   Meds ordered this encounter  Medications   BIKTARVY 50-200-25 MG TABS tablet    Sig: Take 1 tablet by mouth daily.    Dispense:  30 tablet    Refill:  3    Order Specific Question:   Supervising  Provider    Answer:   Daiva Eves, CORNELIUS N [[id number]]     Follow-up: Return in about 1 week (around 11/02/2020) for MPX follow up.

## 2020-10-27 LAB — T-HELPER CELL (CD4) - (RCID CLINIC ONLY)
CD4 % Helper T Cell: 21 % — ABNORMAL LOW (ref 33–65)
CD4 T Cell Abs: 544 /uL (ref 400–1790)

## 2020-10-29 LAB — COMPREHENSIVE METABOLIC PANEL
AG Ratio: 1.1 (calc) (ref 1.0–2.5)
ALT: 14 U/L (ref 9–46)
AST: 13 U/L (ref 10–40)
Albumin: 4.2 g/dL (ref 3.6–5.1)
Alkaline phosphatase (APISO): 58 U/L (ref 36–130)
BUN: 16 mg/dL (ref 7–25)
CO2: 23 mmol/L (ref 20–32)
Calcium: 9.2 mg/dL (ref 8.6–10.3)
Chloride: 106 mmol/L (ref 98–110)
Creat: 1.03 mg/dL (ref 0.60–1.26)
Globulin: 3.9 g/dL (calc) — ABNORMAL HIGH (ref 1.9–3.7)
Glucose, Bld: 106 mg/dL — ABNORMAL HIGH (ref 65–99)
Potassium: 4.8 mmol/L (ref 3.5–5.3)
Sodium: 138 mmol/L (ref 135–146)
Total Bilirubin: 0.3 mg/dL (ref 0.2–1.2)
Total Protein: 8.1 g/dL (ref 6.1–8.1)

## 2020-10-29 LAB — LIPID PANEL
Cholesterol: 147 mg/dL (ref <200)
HDL: 46 mg/dL (ref >=40)
LDL Cholesterol (Calc): 85 mg/dL (calc)
Non-HDL Cholesterol (Calc): 101 mg/dL (calc) (ref <130)
Total CHOL/HDL Ratio: 3.2 (calc) (ref <5.0)
Triglycerides: 71 mg/dL (ref <150)

## 2020-10-29 LAB — HIV-1 RNA QUANT-NO REFLEX-BLD
HIV 1 RNA Quant: NOT DETECTED Copies/mL
HIV-1 RNA Quant, Log: NOT DETECTED Log cps/mL

## 2020-11-02 ENCOUNTER — Ambulatory Visit: Payer: Medicaid Other | Admitting: Physician Assistant

## 2020-11-02 ENCOUNTER — Ambulatory Visit: Payer: Medicaid Other

## 2020-11-06 ENCOUNTER — Other Ambulatory Visit: Payer: Self-pay | Admitting: Internal Medicine

## 2020-11-06 DIAGNOSIS — Z21 Asymptomatic human immunodeficiency virus [HIV] infection status: Secondary | ICD-10-CM

## 2020-11-30 ENCOUNTER — Ambulatory Visit: Payer: Self-pay

## 2020-11-30 ENCOUNTER — Other Ambulatory Visit: Payer: Self-pay

## 2020-11-30 ENCOUNTER — Other Ambulatory Visit (HOSPITAL_COMMUNITY): Payer: Self-pay

## 2020-12-05 ENCOUNTER — Other Ambulatory Visit: Payer: Self-pay | Admitting: Pharmacist

## 2020-12-05 DIAGNOSIS — Z21 Asymptomatic human immunodeficiency virus [HIV] infection status: Secondary | ICD-10-CM

## 2020-12-05 MED ORDER — BICTEGRAVIR-EMTRICITAB-TENOFOV 50-200-25 MG PO TABS: 1.0000 | ORAL_TABLET | Freq: Every day | ORAL | 0 refills | Status: AC

## 2020-12-05 NOTE — Progress Notes (Signed)
Medication Samples have been provided to the patient.  Drug name: Biktarvy        Strength: 50/200/25 mg       Qty: 28 tablets (4 bottles) LOT: CHSYVB   Exp.Date: 6/24  Dosing instructions: Take one tablet by mouth once daily  The patient has been instructed regarding the correct time, dose, and frequency of taking this medication, including desired effects and most common side effects.   Gisselle Galvis, PharmD, CPP Clinical Pharmacist Practitioner Infectious Diseases Clinical Pharmacist [facility]  

## 2021-01-01 ENCOUNTER — Other Ambulatory Visit (HOSPITAL_COMMUNITY): Payer: Self-pay

## 2021-01-02 ENCOUNTER — Other Ambulatory Visit: Payer: Self-pay

## 2021-01-02 ENCOUNTER — Encounter: Payer: Self-pay | Admitting: Physician Assistant

## 2021-01-02 ENCOUNTER — Encounter: Payer: Self-pay | Admitting: Infectious Diseases

## 2021-01-02 ENCOUNTER — Ambulatory Visit (INDEPENDENT_AMBULATORY_CARE_PROVIDER_SITE_OTHER): Payer: Self-pay | Admitting: Physician Assistant

## 2021-01-02 VITALS — BP 157/104 | HR 67 | Temp 98.2°F | Resp 18 | Wt 223.8 lb

## 2021-01-02 DIAGNOSIS — A630 Anogenital (venereal) warts: Secondary | ICD-10-CM

## 2021-01-02 MED ORDER — IMIQUIMOD 5 % EX CREA: TOPICAL_CREAM | CUTANEOUS | 1 refills | Status: DC

## 2021-01-02 NOTE — Patient Instructions (Signed)
Hi Vibhav, great speaking with you today.  Aldara apply 3 times weekly at bedtime for 4 months.    I will see you soon!

## 2021-01-02 NOTE — Progress Notes (Signed)
Subjective:    Patient ID: Jimmy Fitzgerald, male    DOB: 1990-05-28, 30 y.o.   MRN: [id number]  Chief Complaint  Patient presents with   genital warts     HPI:  Jimmy Fitzgerald is a 30 y.o. 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  He was diagnosed 06/2020 and remained hospitalized until October.  He was notified at the time he had genital warts that were not cancerous located along anogenital region. At the time he was focused on building his immune system and is now ready to seek treatment for genital warts.  He denies lesions multiplying, bleeding, growing or painful. He had a biopsy while at [facility] and was diagnosed with genital warts.       No Known Allergies    Outpatient Medications Prior to Visit  Medication Sig Dispense Refill   albuterol (VENTOLIN HFA) 108 (90 Base) MCG/ACT inhaler Inhale 1-2 puffs into the lungs every 6 (six) hours as needed for wheezing or shortness of breath.     BIKTARVY 50-200-25 MG TABS tablet Take 1 tablet by mouth daily. 30 tablet 3   ciprofloxacin (CIPRO) 500 MG tablet Take 1 tablet (500 mg total) by mouth 2 (two) times daily. (Patient not taking: Reported on 02/08/2020) 2 tablet 0   clindamycin (CLEOCIN) 150 MG capsule Take 1 capsule (150 mg total) by mouth every 6 (six) hours. 28 capsule 0   gabapentin (NEURONTIN) 300 MG capsule Take 1 capsule (300 mg total) by mouth 3 (three) times daily. (Patient not taking: Reported on 02/08/2020) 60 capsule 0   methocarbamol (ROBAXIN) 500 MG tablet Take 1 tablet (500 mg total) by mouth 2 (two) times daily. 20 tablet 0   No facility-administered medications prior to visit.     Past Medical History:  Diagnosis Date   Asthma    HIV positive ([place])      No past surgical history on file.     Review of Systems  Constitutional:  Negative for appetite change, fatigue and fever.  HENT:  Negative for mouth sores.   Respiratory: Negative.    Cardiovascular:  Negative.   Gastrointestinal:  Negative for abdominal pain, constipation, diarrhea, nausea and rectal pain.  Genitourinary:  Positive for genital sores. Negative for dysuria, enuresis, penile pain, penile swelling, scrotal swelling and testicular pain.  Hematological: Negative.      Objective:    BP (!) 157/104 Comment: feeling nervous due to nightmare he had this morning typically <140/90  Pulse 67   Temp 98.2 F (36.8 C)   Resp 18   Wt 223 lb 12.8 oz (101.5 kg)   BMI 33.05 kg/m  Nursing note and vital signs reviewed.  Physical Exam Vitals reviewed. Nursing note reviewed: elevated BP due to anxiety from nightmare of friend who was [employer], today was anniversary of his death. he was 13 at the time.. Constitutional:      Appearance: He is obese.  Cardiovascular:     Rate and Rhythm: Normal rate and regular rhythm.  Pulmonary:     Effort: Pulmonary effort is normal.     Breath sounds: Normal breath sounds.  Skin:    Findings: Lesion (anorectal distribution, smooth papular lesions) present.  Neurological:     Mental Status: He is alert.  Psychiatric:        Behavior: Behavior normal.     Depression screen [facility] 2/9 10/07/2019 08/18/2019  Decreased Interest 3 3  Down, Depressed, Hopeless 3 3  PHQ - 2 Score 6 6  Altered sleeping 3 -  Tired, decreased energy 3 -  Change in appetite 3 -  Feeling bad or failure about yourself  3 3  Trouble concentrating 3 -  Moving slowly or fidgety/restless 1 -  Suicidal thoughts 3 2  PHQ-9 Score 25 -  Difficult doing work/chores Extremely dIfficult -       Assessment & Plan:  Genital warts-Start Aldara apply 3 times weekly at bedtime for 16 weeks Follow up if worse or not improved.   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 I am also having him maintain his gabapentin, ciprofloxacin, albuterol, methocarbamol, clindamycin, and Biktarvy.   Meds ordered this encounter  Medications   imiquimod (ALDARA) 5 % cream    Sig: Apply topically 3 (three) times a week.    Dispense:  12 each    Refill:  1    Order Specific Question:   Supervising Provider    Answer:   VAN DAM, CORNELIUS N [[id number]]     Follow-up: No follow-ups on file.

## 2021-02-23 ENCOUNTER — Other Ambulatory Visit: Payer: Self-pay

## 2021-02-23 DIAGNOSIS — A630 Anogenital (venereal) warts: Secondary | ICD-10-CM

## 2021-02-23 MED ORDER — IMIQUIMOD 5 % EX CREA: TOPICAL_CREAM | CUTANEOUS | 1 refills | Status: DC

## 2021-02-28 ENCOUNTER — Ambulatory Visit: Payer: Medicaid Other | Admitting: Physician Assistant

## 2021-03-15 ENCOUNTER — Other Ambulatory Visit: Payer: Self-pay

## 2021-03-15 DIAGNOSIS — Z21 Asymptomatic human immunodeficiency virus [HIV] infection status: Secondary | ICD-10-CM

## 2021-03-15 MED ORDER — BIKTARVY 50-200-25 MG PO TABS
1.0000 | ORAL_TABLET | Freq: Every day | ORAL | 1 refills | Status: DC
Start: 2021-03-15 — End: 2021-04-18

## 2021-04-18 ENCOUNTER — Telehealth: Payer: Self-pay

## 2021-04-18 DIAGNOSIS — Z21 Asymptomatic human immunodeficiency virus [HIV] infection status: Secondary | ICD-10-CM

## 2021-04-18 MED ORDER — BIKTARVY 50-200-25 MG PO TABS: 1.0000 | ORAL_TABLET | Freq: Every day | ORAL | 5 refills | Status: DC

## 2021-04-18 NOTE — Telephone Encounter (Signed)
Received refill request for Biktarvy from [facility] [address]. Patient is due for labs and follow up.  Left voicemail requesting patient call office back to reschedule.  Will refill medication.  Juanita Laster, RMA

## 2021-04-23 ENCOUNTER — Other Ambulatory Visit (HOSPITAL_COMMUNITY): Payer: Self-pay

## 2021-05-20 ENCOUNTER — Encounter (HOSPITAL_COMMUNITY): Payer: Self-pay

## 2021-05-20 ENCOUNTER — Ambulatory Visit (HOSPITAL_COMMUNITY)
Admission: EM | Admit: 2021-05-20 | Discharge: 2021-05-20 | Disposition: A | Discharge status: Home / Self Care | Payer: Medicaid Other | Attending: Family Medicine | Admitting: Family Medicine

## 2021-05-20 ENCOUNTER — Other Ambulatory Visit: Payer: Self-pay

## 2021-05-20 DIAGNOSIS — A630 Anogenital (venereal) warts: Secondary | ICD-10-CM

## 2021-05-20 NOTE — ED Triage Notes (Signed)
Pt would like his skin tags remove on his bottom. He has a total of 3. ?

## 2021-05-20 NOTE — Discharge Instructions (Addendum)
Please call dermatology for an appointment; it may be a 3 or 63-month wait till you see them ? ?Please also call your infectious disease clinic and make an appointment.  I can see that you have not seen them since November 2022, when you were diagnosed with genital warts.  It looks like according to the chart you are due to see them. ?

## 2021-05-20 NOTE — ED Provider Notes (Signed)
?[facility] ? ? ? ?CSN: [id number] ?Arrival date & time: 05/20/21  1551 ? ? ?  ? ?History   ?Chief Complaint ?Chief Complaint  ?Patient presents with  ? Skin Tag  ? ? ?HPI ?Purl Claytor is a 31 y.o. male.  ? ?HPI ?Here stating [facility] here to have skin tags removed from his rectal area. ? ?On review of the chart patient was begun on imiquimod for genital warts around his rectal area in November 2022 by infectious disease.  He has not seen them since then in epic. ? ?When I asked the patient about this he states that he was seen later and told that these were not warts but skin tags instead. ? ? ? ?Past Medical History:  ?Diagnosis Date  ? Asthma   ? HIV positive (HCC)   ? ? ?Patient Active Problem List  ? Diagnosis Date Noted  ? Dysentery 11/01/2019  ? AKI (acute kidney injury) (HCC) 11/01/2019  ? Colitis, acute 11/01/2019  ? Depression 10/08/2019  ? Shingles outbreak 10/08/2019  ? At risk for opportunistic infections 10/08/2019  ? Routine screening for STI (sexually transmitted infection) 08/18/2019  ? Peripheral neuropathy 07/06/2019  ? History of syphilis 07/06/2019  ? Hypertension 07/06/2019  ? Anemia 07/06/2019  ? HIV (human immunodeficiency virus infection) (HCC) 08/19/2018  ? 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 ? ?History reviewed. No pertinent surgical history. ? ? ? ? ?Home Medications   ? ?Prior to Admission medications   ?Medication Sig Start Date End Date Taking? Authorizing Provider  ?albuterol (VENTOLIN HFA) 108 (90 Base) MCG/ACT inhaler Inhale 1-2 puffs into the lungs every 6 (six) hours as needed for wheezing or shortness of breath.    [provider]  ?BIKTARVY 50-200-25 MG TABS tablet Take 1 tablet by mouth daily. 04/18/21   Gardiner Barefoot, MD  ?clindamycin (CLEOCIN) 150 MG capsule Take 1 capsule (150 mg total) by mouth every 6 (six) hours. 03/18/20   Wynetta Fines, MD  ?gabapentin (NEURONTIN) 300 MG capsule Take 1 capsule (300 mg total) by mouth 3 (three)  times daily. ?Patient not taking: Reported on 02/08/2020 09/15/19   Fayrene Helper, PA-C  ?imiquimod [facility] Dad) 5 % cream Apply topically 3 (three) times a week. 02/23/21   Horton Finer, PA-C  ?methocarbamol (ROBAXIN) 500 MG tablet Take 1 tablet (500 mg total) by mouth 2 (two) times daily. 02/08/20   Dietrich Pates, PA-C  ? ? ?Family History ?Family History  ?Problem Relation Age of Onset  ? Diabetes Maternal Grandmother   ? Diabetes Maternal Grandfather   ? Heart attack Paternal Grandmother   ? Breast cancer Maternal Aunt   ? Crohn's disease Cousin   ? Crohn's disease Cousin   ? ? ?Social History ?Social History  ? ?Tobacco Use  ? Smoking status: Never  ? Smokeless tobacco: Never  ?Substance Use Topics  ? Drug use: Not Currently  ? ? ? ?Allergies   ?Patient has no known allergies. ? ? ?Review of Systems ?Review of Systems ? ? ?Physical Exam ?Triage Vital Signs ?ED Triage Vitals  ?Enc Vitals Group  ?   BP 05/20/21 1713 (!) 209/101  ?   Pulse Rate 05/20/21 1716 87  ?   Resp 05/20/21 1713 18  ?   Temp 05/20/21 1716 97.9 ?F (36.6 ?C)  ?   Temp Source 05/20/21 1716 Oral  ?   SpO2 05/20/21 1713 99 %  ?   Weight --   ?  Height --   ?   Head Circumference --   ?   Peak Flow --   ?   Pain Score 05/20/21 1717 5  ?   Pain Loc --   ?   Pain Edu? --   ?   Excl. in GC? --   ? ?No data found. ? ?Updated Vital Signs ?BP (!) 209/101 (BP Location: Left Arm) Comment: 179/130  Pulse 87   Temp 97.9 ?F (36.6 ?C) (Oral)   Resp 18   SpO2 100%  ? ?Visual Acuity ?Right Eye Distance:   ?Left Eye Distance:   ?Bilateral Distance:   ? ?Right Eye Near:   ?Left Eye Near:    ?Bilateral Near:    ? ?Physical Exam ?Vitals reviewed.  ?Genitourinary: ?   Comments: C3 fairly flat lesions that are not skin tags around his rectal area. ?Neurological:  ?   Mental Status: He is alert and oriented to person, place, and time.  ?Psychiatric:     ?   Behavior: Behavior normal.  ? ? ? ?UC Treatments / Results  ?Labs ?(all labs ordered are listed, but only  abnormal results are displayed) ?Labs Reviewed - No data to display ? ?EKG ? ? ?Radiology ?No results found. ? ?Procedures ?Procedures (including critical care time) ? ?Medications Ordered in UC ?Medications - No data to display ? ?Initial Impression / Assessment and Plan / UC Course  ?I have reviewed the triage vital signs and the nursing notes. ? ?Pertinent labs & imaging results that were available during my care of the patient were reviewed by me and considered in my medical decision making (see chart for details). ? ?  ? ?I cannot see that the infectious disease clinic was able to reexamine him after he used the Aldara.  It also looks like they have tried to contact him to get him to make a follow-up appointment.  He is given the contact information for dermatology, and I think he should also see his infectious disease office.  Discussed with him that these are not skin tags. ?Final Clinical Impressions(s) / UC Diagnoses  ? ?Final diagnoses:  ?Genital warts  ? ? ? ?Discharge Instructions   ? ?  ?Please call dermatology for an appointment; it may be a 3 or 76-month wait till you see them ? ?Please also call your infectious disease clinic and make an appointment.  I can see that you have not seen them since November 2022, when you were diagnosed with genital warts.  It looks like according to the chart you are due to see them. ? ? ? ? ?ED Prescriptions   ?None ?  ? ?PDMP not reviewed this encounter. ?  ?Zenia Resides, MD ?05/20/21 1739 ? ?

## 2021-05-24 ENCOUNTER — Other Ambulatory Visit: Payer: Self-pay

## 2021-05-24 ENCOUNTER — Ambulatory Visit (INDEPENDENT_AMBULATORY_CARE_PROVIDER_SITE_OTHER): Payer: Self-pay | Admitting: Physician Assistant

## 2021-05-24 ENCOUNTER — Other Ambulatory Visit: Payer: Self-pay | Admitting: Physician Assistant

## 2021-05-24 ENCOUNTER — Encounter: Payer: Self-pay | Admitting: Physician Assistant

## 2021-05-24 VITALS — BP 120/65 | HR 87 | Temp 98.2°F | Wt 219.6 lb

## 2021-05-24 DIAGNOSIS — B20 Human immunodeficiency virus [HIV] disease: Secondary | ICD-10-CM

## 2021-05-24 DIAGNOSIS — A630 Anogenital (venereal) warts: Secondary | ICD-10-CM

## 2021-05-24 DIAGNOSIS — I10 Essential (primary) hypertension: Secondary | ICD-10-CM

## 2021-05-24 DIAGNOSIS — Z79899 Other long term (current) drug therapy: Secondary | ICD-10-CM

## 2021-05-24 DIAGNOSIS — Z21 Asymptomatic human immunodeficiency virus [HIV] infection status: Secondary | ICD-10-CM

## 2021-05-24 DIAGNOSIS — J4520 Mild intermittent asthma, uncomplicated: Secondary | ICD-10-CM

## 2021-05-24 DIAGNOSIS — Z23 Encounter for immunization: Secondary | ICD-10-CM

## 2021-05-24 MED ORDER — ALBUTEROL SULFATE HFA 108 (90 BASE) MCG/ACT IN AERS: 1.0000 | INHALATION_SPRAY | Freq: Four times a day (QID) | RESPIRATORY_TRACT | 2 refills | Status: AC | PRN

## 2021-05-24 MED ORDER — BIKTARVY 50-200-25 MG PO TABS: 1.0000 | ORAL_TABLET | Freq: Every day | ORAL | 5 refills | Status: AC

## 2021-05-24 MED ORDER — HYDROCHLOROTHIAZIDE 12.5 MG PO TABS: 12.5000 mg | ORAL_TABLET | Freq: Every day | ORAL | 0 refills | Status: DC

## 2021-05-24 NOTE — Progress Notes (Signed)
? ?Subjective:  ? ? Patient ID: Jimmy Fitzgerald, male    DOB: December 20, 1990, 31 y.o.   MRN: [id number] ? ?Chief Complaint  ?Patient presents with  ? Follow-up  ?  HIV well controlled ?  ? ? ? ?HPI: ? ?Jimmy Fitzgerald is a 31 y.o. male presents today for follow up regarding HIV-1. He has been adherent to [facility] without missed doses and tolerating well without any Adverse effects.  Last VL 9/22 undetectable and CD4 450. He was screened for STIs March 2023 all normal. He is potentially moving to [place] in May 2023 to care for aunt and grandmother, will need referral to [facility]. ? ?He has had 3 anal warts-used Aldara as instructed for 4 months, improved greatly.  Raised skin colored lesions x 3 not painful.  These were sent for cryotherapy 05/22/2021. He will follow up 06/21/21 with dermatology.   ? ?He has had elevated blood pressure the last several office visits.  He has family hx of cardiac disease.  He is asymptomatic and does not monitor at home.  He has been under increased stress due to potential move to [place].   ? ?Asthma-requesting refill of ventolin for seasonal use.  He has had some wheezing since weather change and has no refills.  ? ? ?Elevated blood pressure, has been running high ?You may moving to [place] ?Referral to ID in [place] ?Immunizations ?Refills 6 months ?Adherent with biktarvy ? ?No Known Allergies ?Carmelina Paddock NP cryotherapy Tuesday ? ? ? ?Outpatient Medications Prior to Visit  ?Medication Sig Dispense Refill  ? clindamycin (CLEOCIN) 150 MG capsule Take 1 capsule (150 mg total) by mouth every 6 (six) hours. 28 capsule 0  ? gabapentin (NEURONTIN) 300 MG capsule Take 1 capsule (300 mg total) by mouth 3 (three) times daily. (Patient not taking: Reported on 02/08/2020) 60 capsule 0  ? imiquimod (ALDARA) 5 % cream Apply topically 3 (three) times a week. 12 each 1  ? methocarbamol (ROBAXIN) 500 MG tablet Take 1 tablet (500 mg total) by mouth 2 (two) times daily. 20 tablet 0  ? albuterol  (VENTOLIN HFA) 108 (90 Base) MCG/ACT inhaler Inhale 1-2 puffs into the lungs every 6 (six) hours as needed for wheezing or shortness of breath.    ? BIKTARVY 50-200-25 MG TABS tablet Take 1 tablet by mouth daily. 30 tablet 5  ? ?No facility-administered medications prior to visit.  ? ? ? ?Past Medical History:  ?Diagnosis Date  ? Asthma   ? HIV positive (HCC)   ? ? ? ?History reviewed. No pertinent surgical history. ? ? ? ? ?Review of Systems  ?Constitutional:  Negative for activity change, appetite change, chills, fatigue and unexpected weight change.  ?HENT:  Positive for postnasal drip, rhinorrhea and sneezing. Negative for congestion, sore throat, tinnitus and trouble swallowing.   ?Eyes:  Negative for visual disturbance.  ?Respiratory:  Positive for cough (dry cough during examination, mild) and wheezing. Negative for chest tightness and shortness of breath.   ?Cardiovascular:  Negative for chest pain, palpitations and leg swelling.  ?Gastrointestinal: Negative.  Negative for abdominal pain, constipation, diarrhea, nausea and vomiting.  ?Musculoskeletal:  Negative for arthralgias, myalgias and neck pain.  ?Neurological: Negative.  Negative for dizziness, syncope, weakness and light-headedness.  ?Hematological:  Negative for adenopathy.  ?Psychiatric/Behavioral: Negative.    ?   ?Objective:  ?  ?BP 120/65 Comment: under stress however last 3 appointments has been elevated  Pulse 87   Temp 98.2 ?F (36.8 ?C)  Wt 219 lb 9.6 oz (99.6 kg)   BMI 32.43 kg/m?  ?Nursing note and vital signs reviewed. ? ?Physical Exam ?Vitals and nursing note reviewed.  ?Constitutional:   ?   General: He is not in acute distress. ?   Appearance: Normal appearance. He is obese. He is not ill-appearing, toxic-appearing or diaphoretic.  ?HENT:  ?   Head: Normocephalic and atraumatic.  ?   Mouth/Throat:  ?   Mouth: Mucous membranes are moist.  ?   Pharynx: Oropharynx is clear.  ?Eyes:  ?   Conjunctiva/sclera: Conjunctivae normal.  ?    Pupils: Pupils are equal, round, and reactive to light.  ?Cardiovascular:  ?   Rate and Rhythm: Normal rate and regular rhythm.  ?Pulmonary:  ?   Effort: Pulmonary effort is normal.  ?   Breath sounds: Normal breath sounds.  ?Musculoskeletal:     ?   General: Normal range of motion.  ?   Cervical back: Normal range of motion.  ?Lymphadenopathy:  ?   Cervical: No cervical adenopathy.  ?Skin: ?   General: Skin is warm and dry.  ?Neurological:  ?   General: No focal deficit present.  ?   Mental Status: He is alert and oriented to person, place, and time.  ?Psychiatric:     ?   Mood and Affect: Mood normal.     ?   Behavior: Behavior normal.     ?   Thought Content: Thought content normal.     ?   Judgment: Judgment normal.  ? ? ? ? ?  10/07/2019  ? 11:06 AM 08/18/2019  ?  8:47 AM  ?Depression screen PHQ 2/9  ?Decreased Interest 3 3  ?Down, Depressed, Hopeless 3 3  ?PHQ - 2 Score 6 6  ?Altered sleeping 3   ?Tired, decreased energy 3   ?Change in appetite 3   ?Feeling bad or failure about yourself  3 3  ?Trouble concentrating 3   ?Moving slowly or fidgety/restless 1   ?Suicidal thoughts 3 2  ?PHQ-9 Score 25   ?Difficult doing work/chores Extremely dIfficult   ?  ?   ?Assessment & Plan:  ?HIV-well controlled adherent and tolerating Biktarvy.  Sent 6 refills.  Provided condoms and lubricant, STI screen recent was all normal; rechecking labs today ?Immunizations provided today: Gardasil, Menvayo, and PCV20 today ? ?Hypertension: start HCTZ 12.5 mg follow up in 1 week for BP recheck, DASH diet encouraged, discussed s/sxs of cardiac event, call 911 without delay if they develop ? ?Anal warts-follow up with derm, cryotherapy 05/22/21 ? ?Asthma-refilled Ventolin inhaler, use as directed ? ?Patient Active Problem List  ? Diagnosis Date Noted  ? Dysentery 11/01/2019  ? AKI (acute kidney injury) (HCC) 11/01/2019  ? Colitis, acute 11/01/2019  ? Depression 10/08/2019  ? Shingles outbreak 10/08/2019  ? At risk for opportunistic  infections 10/08/2019  ? Routine screening for STI (sexually transmitted infection) 08/18/2019  ? Peripheral neuropathy 07/06/2019  ? History of syphilis 07/06/2019  ? Hypertension 07/06/2019  ? Anemia 07/06/2019  ? HIV (human immunodeficiency virus infection) (HCC) 08/19/2018  ? 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 ? ? ?Problem List Items Addressed This Visit   ? ?  ? Cardiovascular and Mediastinum  ? Hypertension  ? Relevant Medications  ? hydrochlorothiazide (HYDRODIURIL) 12.5 MG tablet  ?  ? Other  ? HIV (human immunodeficiency virus infection) (HCC) - Primary (Chronic)  ? Relevant Medications  ? BIKTARVY 50-200-25 MG TABS tablet  ? Other Relevant Orders  ?  Ambulatory referral to Infectious Disease  ? HIV-1 RNA quant-no reflex-bld  ? T-helper cell (CD4)- ([facility] clinic only)  ? COMPLETE METABOLIC PANEL WITH GFR  ? CBC with Differential/Platelet  ? Hemoglobin A1c  ? Lipid panel  ? ?Other Visit Diagnoses   ? ? Anal warts      ? Relevant Medications  ? BIKTARVY 50-200-25 MG TABS tablet  ? Pharmacologic therapy      ? Mild intermittent asthma without complication      ? Relevant Medications  ? albuterol (VENTOLIN HFA) 108 (90 Base) MCG/ACT inhaler  ? Asymptomatic HIV infection (HCC)      ? Relevant Medications  ? BIKTARVY 50-200-25 MG TABS tablet  ? ?  ? ? ? ?I am having [name] start on hydrochlorothiazide. I am also having him maintain his gabapentin, methocarbamol, clindamycin, imiquimod, albuterol, and Biktarvy. ? ? ?Meds ordered this encounter  ?Medications  ? albuterol (VENTOLIN HFA) 108 (90 Base) MCG/ACT inhaler  ?  Sig: Inhale 1-2 puffs into the lungs every 6 (six) hours as needed for wheezing or shortness of breath.  ?  Dispense:  1 each  ?  Refill:  2  ?  Order Specific Question:   Supervising Provider  ?  Answer:   VAN DAM, CORNELIUS N [[id number]]  ? hydrochlorothiazide (HYDRODIURIL) 12.5 MG tablet  ?  Sig: Take 1 tablet (12.5 mg total) by mouth daily.  ?  Dispense:  30 tablet  ?  Refill:  0  ?  Order  Specific Question:   Supervising Provider  ?  Answer:   VAN DAM, CORNELIUS N [[id number]]  ? BIKTARVY 50-200-25 MG TABS tablet  ?  Sig: Take 1 tablet by mouth daily.  ?  Dispense:  30 tablet  ?  Refill:  5  ?  Order Specific Question:

## 2021-05-24 NOTE — Addendum Note (Signed)
Addended by: Philippa Chester on: 05/24/2021 11:21 AM ? ? Modules accepted: Orders ? ?

## 2021-05-24 NOTE — Patient Instructions (Addendum)
Hi Beverly! Nice seeing you again. ?I have refilled biktarvy continue to take as directed for 6 month refills ?Referral to ID clinic in [facility] white program, remember to renew July 2023 ?Immunizations today pneumonia, gardasil, menvayo ?Labs today ?Follow up with derm regarding recent cryotherapy ?Start hydrochlorothiazide 12.5 mg for elevated blood pressure, take once daily-follow up in 1 week to recheck blood pressure ?If you develop chest pain, SOB, left arm or neck pain please call 911 without delay these may be cardiac related symptoms, follow a low sodium diet with plenty of fruits and vegetables ?HIV - [facility]. [address]. [place], [place] [address]. Phone: [phone].  ?HIV - [facility]. [address], [place], [place] [address]. ...  ?HIV - [facility]. [address], ...  ?HIV - [facility]. [address] [place], [place] [address]. ...  ?

## 2021-05-27 LAB — T-HELPER CELL (CD4) - (RCID CLINIC ONLY)
CD4 % Helper T Cell: 19 % — ABNORMAL LOW (ref 33–65)
CD4 T Cell Abs: 317 /uL — ABNORMAL LOW (ref 400–1790)

## 2021-05-28 LAB — COMPLETE METABOLIC PANEL WITH GFR
AG Ratio: 1.5 (calc) (ref 1.0–2.5)
ALT: 13 U/L (ref 9–46)
AST: 11 U/L (ref 10–40)
Albumin: 4.6 g/dL (ref 3.6–5.1)
Alkaline phosphatase (APISO): 56 U/L (ref 36–130)
BUN: 16 mg/dL (ref 7–25)
CO2: 26 mmol/L (ref 20–32)
Calcium: 9.3 mg/dL (ref 8.6–10.3)
Chloride: 108 mmol/L (ref 98–110)
Creat: 1.07 mg/dL (ref 0.60–1.26)
Globulin: 3 g/dL (calc) (ref 1.9–3.7)
Glucose, Bld: 97 mg/dL (ref 65–99)
Potassium: 4.8 mmol/L (ref 3.5–5.3)
Sodium: 140 mmol/L (ref 135–146)
Total Bilirubin: 0.5 mg/dL (ref 0.2–1.2)
Total Protein: 7.6 g/dL (ref 6.1–8.1)
eGFR: 95 mL/min/{1.73_m2} (ref >=60)

## 2021-05-28 LAB — CBC WITH DIFFERENTIAL/PLATELET
Absolute Monocytes: 420 cells/uL (ref 200–950)
Basophils Absolute: 32 cells/uL (ref 0–200)
Basophils Relative: 0.8 %
Eosinophils Absolute: 172 cells/uL (ref 15–500)
Eosinophils Relative: 4.3 %
HCT: 48.8 % (ref 38.5–50.0)
Hemoglobin: 16.5 g/dL (ref 13.2–17.1)
Lymphs Abs: 1976 cells/uL (ref 850–3900)
MCH: 30.7 pg (ref 27.0–33.0)
MCHC: 33.8 g/dL (ref 32.0–36.0)
MCV: 90.9 fL (ref 80.0–100.0)
MPV: 12.6 fL — ABNORMAL HIGH (ref 7.5–12.5)
Monocytes Relative: 10.5 %
Neutro Abs: 1400 cells/uL — ABNORMAL LOW (ref 1500–7800)
Neutrophils Relative %: 35 %
Platelets: 142 10*3/uL (ref 140–400)
RBC: 5.37 10*6/uL (ref 4.20–5.80)
RDW: 12.6 % (ref 11.0–15.0)
Total Lymphocyte: 49.4 %
WBC: 4 10*3/uL (ref 3.8–10.8)

## 2021-05-28 LAB — HEMOGLOBIN A1C
Hgb A1c MFr Bld: 5.6 % of total Hgb (ref <5.7)
Mean Plasma Glucose: 114 mg/dL
eAG (mmol/L): 6.3 mmol/L

## 2021-05-28 LAB — LIPID PANEL
Cholesterol: 171 mg/dL (ref <200)
HDL: 53 mg/dL (ref >=40)
LDL Cholesterol (Calc): 101 mg/dL (calc) — ABNORMAL HIGH
Non-HDL Cholesterol (Calc): 118 mg/dL (calc) (ref <130)
Total CHOL/HDL Ratio: 3.2 (calc) (ref <5.0)
Triglycerides: 78 mg/dL (ref <150)

## 2021-05-28 LAB — HIV-1 RNA QUANT-NO REFLEX-BLD
HIV 1 RNA Quant: <20 Copies/mL — ABNORMAL HIGH
HIV-1 RNA Quant, Log: <1.3 Log cps/mL — ABNORMAL HIGH

## 2021-05-29 ENCOUNTER — Other Ambulatory Visit (HOSPITAL_COMMUNITY): Payer: Self-pay

## 2021-05-31 ENCOUNTER — Ambulatory Visit: Payer: Medicaid Other | Admitting: Physician Assistant

## 2021-06-20 ENCOUNTER — Other Ambulatory Visit: Payer: Self-pay

## 2021-06-20 DIAGNOSIS — I10 Essential (primary) hypertension: Secondary | ICD-10-CM

## 2021-06-28 MED ORDER — HYDROCHLOROTHIAZIDE 12.5 MG PO TABS: 12.5000 mg | ORAL_TABLET | Freq: Every day | ORAL | 0 refills | Status: DC

## 2021-07-03 ENCOUNTER — Other Ambulatory Visit: Payer: Self-pay

## 2021-07-03 ENCOUNTER — Ambulatory Visit (INDEPENDENT_AMBULATORY_CARE_PROVIDER_SITE_OTHER): Payer: Medicaid Other | Admitting: Physician Assistant

## 2021-07-03 ENCOUNTER — Encounter: Payer: Self-pay | Admitting: Physician Assistant

## 2021-07-03 VITALS — BP 152/110 | HR 77 | Resp 16 | Wt 230.0 lb

## 2021-07-03 DIAGNOSIS — Z21 Asymptomatic human immunodeficiency virus [HIV] infection status: Secondary | ICD-10-CM | POA: Diagnosis not present

## 2021-07-03 DIAGNOSIS — J45909 Unspecified asthma, uncomplicated: Secondary | ICD-10-CM | POA: Insufficient documentation

## 2021-07-03 DIAGNOSIS — I10 Essential (primary) hypertension: Secondary | ICD-10-CM

## 2021-07-03 MED ORDER — FLUTICASONE-SALMETEROL 100-50 MCG/ACT IN AEPB: 1.0000 | INHALATION_SPRAY | Freq: Two times a day (BID) | RESPIRATORY_TRACT | 5 refills | Status: AC

## 2021-07-03 MED ORDER — MONTELUKAST SODIUM 10 MG PO TABS: 10.0000 mg | ORAL_TABLET | Freq: Every day | ORAL | 11 refills | Status: AC

## 2021-07-03 MED ORDER — HYDROCHLOROTHIAZIDE 12.5 MG PO TABS: 12.5000 mg | ORAL_TABLET | Freq: Every day | ORAL | 11 refills | Status: DC

## 2021-07-03 NOTE — Progress Notes (Signed)
? ?Subjective:  ? ? Patient ID: Jimmy Fitzgerald, male    DOB: 12/09/1990, 31 y.o.   MRN: [id number] ? ?Chief Complaint  ?Patient presents with  ? Hypertension  ?  Recheck, been out of HCTZ x 2 weeks  ? ? ? ?HPI: ? ?Jimmy Fitzgerald is a 31 y.o. male [facility] taking Biktarvy, tolerating well, with no missed doses.  He reports today for follow up regarding hypertension.  He started HCTZ 12.5 mg on 24 May 2021.  He tolerated the medication well and found his BP was < 140/90 consistently.  He ran out of medication and was unable to return to clinic 2 weeks ago for refills.  Today his BP is elevated, as expected. He denies any cardiac side effects and is currently asymptomatic.  He has started reducing sodium in his diet and will start moderate exercise 30 minutes per day. He does not drink alcohol or use tobacco products.   ? ?He has confirmed he is moving to [place], [place] on May 29 to care for his ailing grandmother. He has been provided a list of [facility] near his home.  ? ?Asthma-he received ventolin inhaler and found his SOB and wheezing has been worse over the last month.  His apartment neighbors have cats and he has an allergy and believes that may be the reason why he is having to use inhaler 3-4 times per day. In the past he has used Advair and Singulair to help manage his moderate persistent symptoms of cough, wheezing, sob. He denies fever, night time symptoms, dizziness, CP, abd pain, n/v/d/c, HA. ? ?No Known Allergies ? ? ? ?Outpatient Medications Prior to Visit  ?Medication Sig Dispense Refill  ? albuterol (VENTOLIN HFA) 108 (90 Base) MCG/ACT inhaler Inhale 1-2 puffs into the lungs every 6 (six) hours as needed for wheezing or shortness of breath. 1 each 2  ? BIKTARVY 50-200-25 MG TABS tablet Take 1 tablet by mouth daily. 30 tablet 5  ? clindamycin (CLEOCIN) 150 MG capsule Take 1 capsule (150 mg total) by mouth every 6 (six) hours. 28 capsule 0  ? gabapentin (NEURONTIN) 300 MG capsule Take 1 capsule (300 mg  total) by mouth 3 (three) times daily. (Patient not taking: Reported on 02/08/2020) 60 capsule 0  ? imiquimod (ALDARA) 5 % cream Apply topically 3 (three) times a week. 12 each 1  ? methocarbamol (ROBAXIN) 500 MG tablet Take 1 tablet (500 mg total) by mouth 2 (two) times daily. 20 tablet 0  ? hydrochlorothiazide (HYDRODIURIL) 12.5 MG tablet Take 1 tablet (12.5 mg total) by mouth daily. 30 tablet 0  ? ?No facility-administered medications prior to visit.  ? ? ? ?Past Medical History:  ?Diagnosis Date  ? Asthma   ? HIV positive (HCC)   ? ? ? ?History reviewed. No pertinent surgical history. ? ? ? ? ?Review of Systems  ?Constitutional:  Negative for chills, fatigue and fever.  ?HENT:  Negative for sinus pressure, sinus pain and sore throat.   ?Eyes:  Negative for visual disturbance.  ?Respiratory:  Positive for cough, shortness of breath and wheezing. Negative for apnea, choking, chest tightness and stridor.   ?Cardiovascular:  Negative for chest pain, palpitations and leg swelling.  ?Gastrointestinal:  Negative for abdominal pain, constipation, diarrhea and vomiting.  ?Musculoskeletal:  Negative for arthralgias and back pain.  ?Allergic/Immunologic: Negative for immunocompromised state.  ?Neurological:  Negative for dizziness, weakness, light-headedness and headaches.  ?Hematological:  Negative for adenopathy.  ?Psychiatric/Behavioral:  Negative for agitation.   ?   ?  Objective:  ?  ?BP (!) 152/110   Pulse 77   Resp 16   Wt 230 lb (104.3 kg)   BMI 33.97 kg/m?  ?Nursing note and vital signs reviewed. ? ?Physical Exam ?Vitals reviewed.  ?Constitutional:   ?   General: He is not in acute distress. ?   Appearance: Normal appearance. He is obese. He is not ill-appearing, toxic-appearing or diaphoretic.  ?HENT:  ?   Head: Normocephalic.  ?   Nose: No congestion.  ?   Mouth/Throat:  ?   Pharynx: No oropharyngeal exudate or posterior oropharyngeal erythema.  ?Eyes:  ?   Extraocular Movements: Extraocular movements intact.   ?   Conjunctiva/sclera: Conjunctivae normal.  ?   Pupils: Pupils are equal, round, and reactive to light.  ?Cardiovascular:  ?   Rate and Rhythm: Normal rate and regular rhythm.  ?   Pulses: Normal pulses.  ?   Heart sounds: Normal heart sounds.  ?Pulmonary:  ?   Effort: Pulmonary effort is normal. No respiratory distress.  ?   Breath sounds: No stridor. Wheezing (expiratory wheezing bilateral all lung fields) present. No rhonchi or rales.  ?Chest:  ?   Chest wall: No tenderness.  ?Musculoskeletal:  ?   Cervical back: Normal range of motion and neck supple.  ?Lymphadenopathy:  ?   Cervical: No cervical adenopathy.  ?Skin: ?   General: Skin is warm and dry.  ?Neurological:  ?   General: No focal deficit present.  ?   Mental Status: He is alert. Mental status is at baseline. He is disoriented.  ?Psychiatric:     ?   Mood and Affect: Mood normal.     ?   Behavior: Behavior normal.     ?   Thought Content: Thought content normal.     ?   Judgment: Judgment normal.  ? ? ? ? ?  10/07/2019  ? 11:06 AM 08/18/2019  ?  8:47 AM  ?Depression screen PHQ 2/9  ?Decreased Interest 3 3  ?Down, Depressed, Hopeless 3 3  ?PHQ - 2 Score 6 6  ?Altered sleeping 3   ?Tired, decreased energy 3   ?Change in appetite 3   ?Feeling bad or failure about yourself  3 3  ?Trouble concentrating 3   ?Moving slowly or fidgety/restless 1   ?Suicidal thoughts 3 2  ?PHQ-9 Score 25   ?Difficult doing work/chores Extremely dIfficult   ?  ?   ?Assessment & Plan:  ?HIV-1: continue Biktarvy as directed, list of [facility] clinics near new home in [place], [place] ?Hypertension: refilled HCTZ, will return in 2 weeks to recheck, elevated today due to no antihypertensive in 2 weeks, was under control at home when checked on HCTZ 12.5 mg, encouraged DASH diet and moderate exercise 30 minutes per day. Asymptomatic, and stable upon discharge.  Advised if develops cardiac symptoms call 911 without delay ?Asthma-reduce use of inhaler, start Advair Discus, start singulair  use as directed ? ? ? ?Patient Active Problem List  ? Diagnosis Date Noted  ? Moderate asthma 07/03/2021  ? Dysentery 11/01/2019  ? AKI (acute kidney injury) (HCC) 11/01/2019  ? Colitis, acute 11/01/2019  ? Depression 10/08/2019  ? Shingles outbreak 10/08/2019  ? At risk for opportunistic infections 10/08/2019  ? Routine screening for STI (sexually transmitted infection) 08/18/2019  ? Peripheral neuropathy 07/06/2019  ? History of syphilis 07/06/2019  ? Hypertension 07/06/2019  ? Anemia 07/06/2019  ? HIV (human immunodeficiency virus infection) (HCC) 08/19/2018  ? 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 ? ? ?Problem List Items Addressed This Visit   ? ?  ? Cardiovascular and Mediastinum  ? Hypertension  ? Relevant Medications  ? hydrochlorothiazide (HYDRODIURIL) 12.5 MG tablet  ?  ? Respiratory  ? Moderate asthma - Primary  ? Relevant Medications  ? fluticasone-salmeterol (ADVAIR DISKUS) 100-50 MCG/ACT AEPB  ? montelukast (SINGULAIR) 10 MG tablet  ?  ? Other  ? HIV (human immunodeficiency virus infection) (HCC) (Chronic)  ? ? ? ?I am having Jimmy Fitzgerald start on fluticasone-salmeterol and montelukast. I am also having him maintain his gabapentin, methocarbamol, clindamycin, imiquimod, albuterol, Biktarvy, and hydrochlorothiazide. ? ? ?Meds ordered this encounter  ?Medications  ? fluticasone-salmeterol (ADVAIR DISKUS) 100-50 MCG/ACT AEPB  ?  Sig: Inhale 1 puff into the lungs 2 (two) times daily.  ?  Dispense:  1 each  ?  Refill:  5  ?  Order Specific Question:   Supervising Provider  ?  Answer:   VAN DAM, CORNELIUS N [[id number]]  ? hydrochlorothiazide (HYDRODIURIL) 12.5 MG tablet  ?  Sig: Take 1 tablet (12.5 mg total) by mouth daily.  ?  Dispense:  30 tablet  ?  Refill:  11  ?  Order Specific Question:   Supervising Provider  ?  Answer:   VAN DAM, CORNELIUS N [[id number]]  ? montelukast (SINGULAIR) 10 MG tablet  ?  Sig: Take 1 tablet (10 mg total) by mouth at bedtime.  ?  Dispense:  30 tablet  ?  Refill:  11  ?  Order Specific  Question:   Supervising Provider  ?  Answer:   VAN DAM, CORNELIUS N [[id number]]  ? ? ? ?Follow-up: Return in about 2 weeks (around 07/17/2021) for HTN. ? ? ? ? ?

## 2021-07-03 NOTE — Patient Instructions (Addendum)
[facility] ?[address] ?[place], [place], [address] ?Tel: [phone] ?Distance: ~1.32 miles ? ?[facility] ? ?[address], [address], [place] [address] ?Telephone: [phone]  Email: [email or web address] ? ?[facility] ?[address] ?[place], [place], [address] ?Tel: [phone] ?Distance: ~6.3 miles ? ?[facility] ?[place], [place], [address] ?Tel: [phone] ?Distance: ~6.9 miles ? ?Start Advair diskus and use as instructed ?Singulair 10 mg once daily ?Establish with new [facility] in [place], [place] ?Refilled hydrochlorothiazide  ?Follow up  ? ? ?

## 2021-07-17 ENCOUNTER — Ambulatory Visit: Payer: Self-pay | Admitting: Physician Assistant

## 2021-07-17 ENCOUNTER — Telehealth: Payer: Self-pay

## 2021-07-17 DIAGNOSIS — I10 Essential (primary) hypertension: Secondary | ICD-10-CM

## 2021-07-17 MED ORDER — HYDROCHLOROTHIAZIDE 12.5 MG PO TABS: 12.5000 mg | ORAL_TABLET | Freq: Every day | ORAL | 3 refills | Status: AC

## 2021-07-17 NOTE — Addendum Note (Signed)
Addended by: Lucie Leather D on: 07/17/2021 09:30 AM   Modules accepted: Orders

## 2021-07-17 NOTE — Telephone Encounter (Signed)
Called patient to see if he was going to be able to make it to his appointment this morning, no answer. Left HIPAA compliant voicemail requesting callback.   Received refill request from [facility] in [place] for 90 day supply of hydrochlorothiazide. Will route to provider.   Beryle Flock, RN

## 2021-07-24 ENCOUNTER — Other Ambulatory Visit: Payer: Self-pay

## 2021-07-24 DIAGNOSIS — A630 Anogenital (venereal) warts: Secondary | ICD-10-CM

## 2021-07-25 ENCOUNTER — Other Ambulatory Visit: Payer: Self-pay

## 2021-07-25 DIAGNOSIS — A630 Anogenital (venereal) warts: Secondary | ICD-10-CM

## 2021-07-25 MED ORDER — IMIQUIMOD 5 % EX CREA: TOPICAL_CREAM | CUTANEOUS | 1 refills | Status: AC

## 2022-06-13 IMAGING — CT CT ABD-PELV W/ CM
2 of 4 series · 16 of 46 positions shown, 18 images · IV contrast (omnipaque)
Comparison: None.

CLINICAL DATA: Lower abdominal pain, diarrhea.

EXAM:
CT ABDOMEN AND PELVIS WITH CONTRAST
TECHNIQUE: Multidetector CT imaging of the abdomen and pelvis was performed
using the standard protocol following bolus administration of
intravenous contrast.
CONTRAST:  100mL OMNIPAQUE IOHEXOL 300 MG/ML  SOLN

[Series 3: abd/ pelvis 5.0 i30f 2 · axial · 0.79mm/px · z∈[+942,+1407]mm · 13 of 103 slices shown, 15 images]
[im 5/103  soft-tissue]
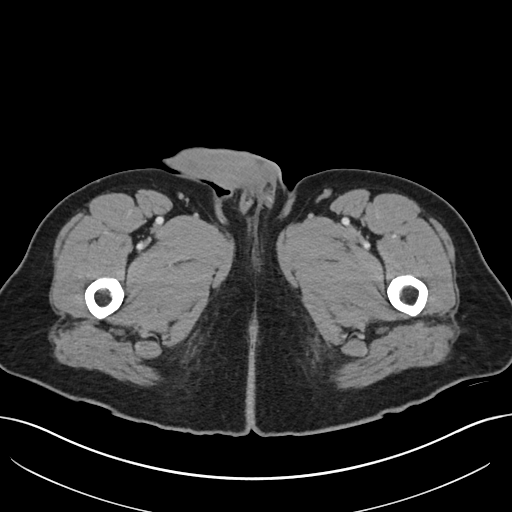
[im 5/103  bone]
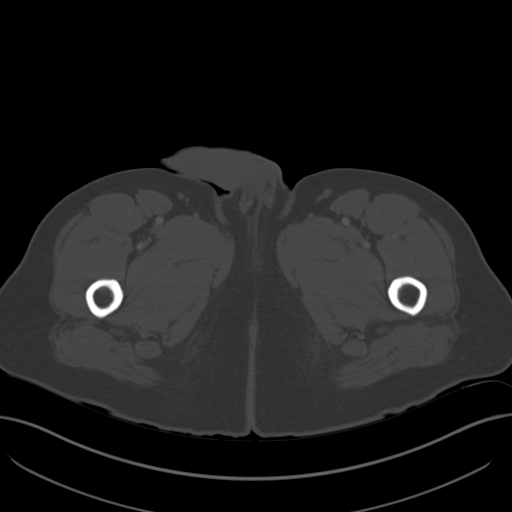
[im 13/103  soft-tissue]
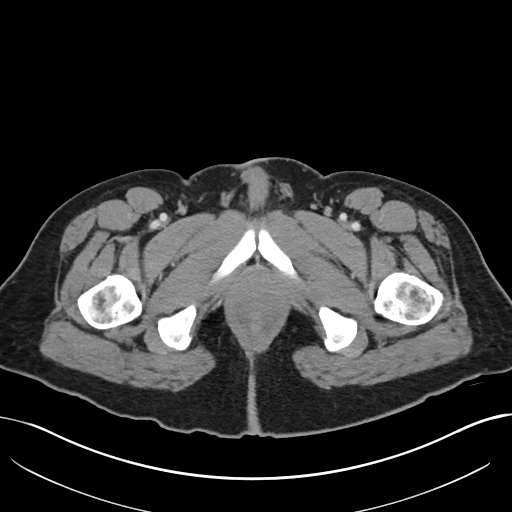
[im 22/103  soft-tissue]
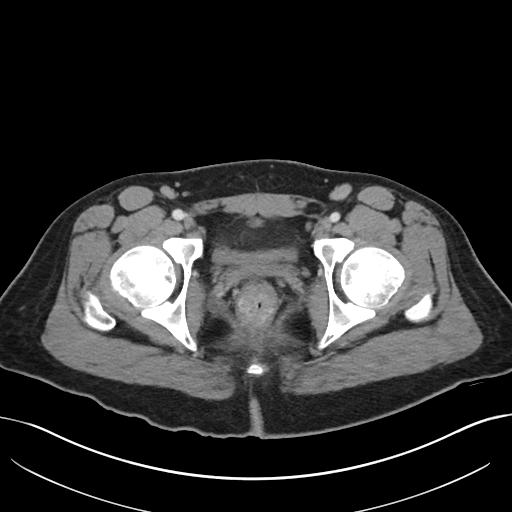
[im 30/103  soft-tissue]
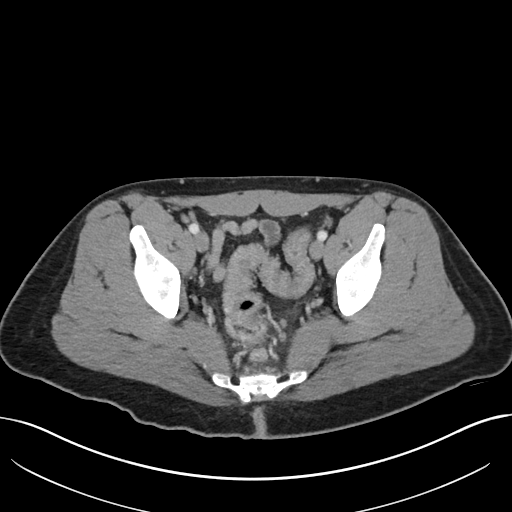
[im 35/103  soft-tissue]
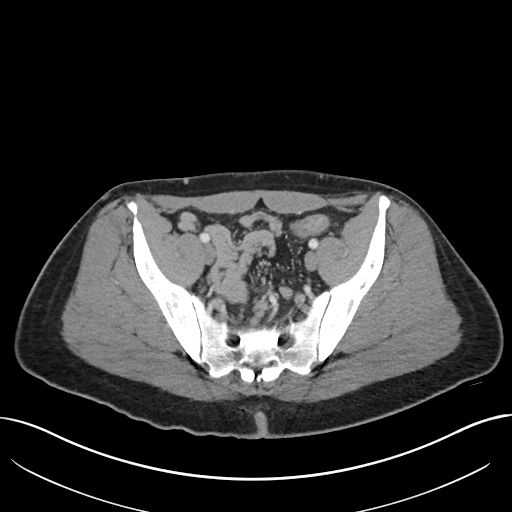
[im 43/103  soft-tissue]
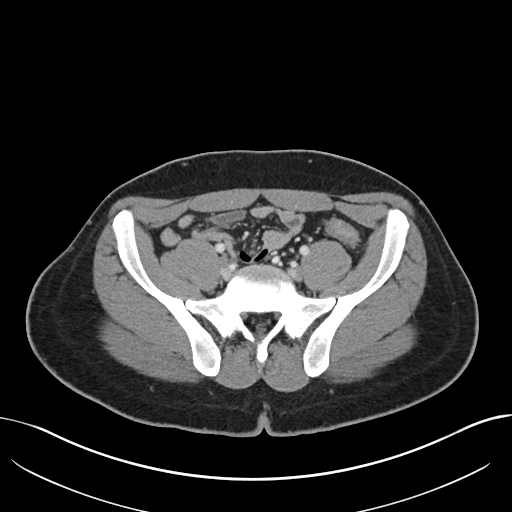
[im 52/103  soft-tissue]
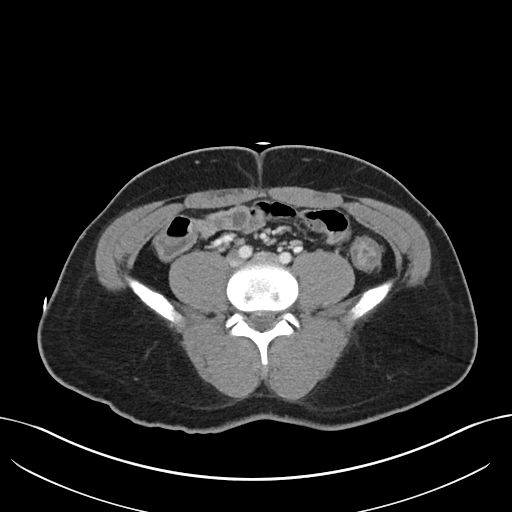
[im 60/103  soft-tissue]
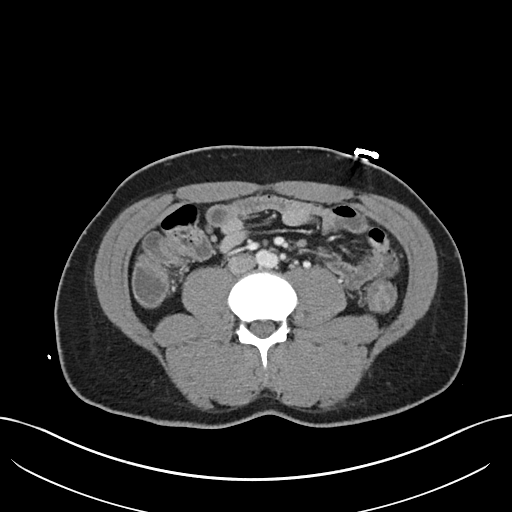
[im 69/103  soft-tissue]
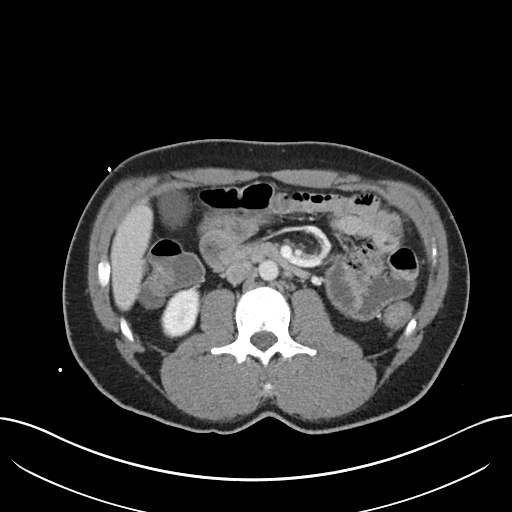
[im 69/103  bone]
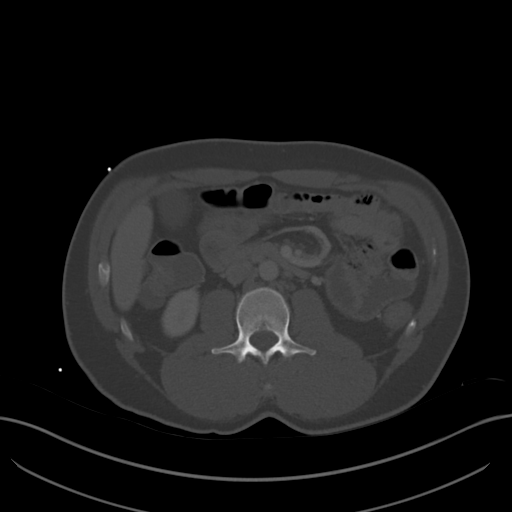
[im 73/103  soft-tissue]
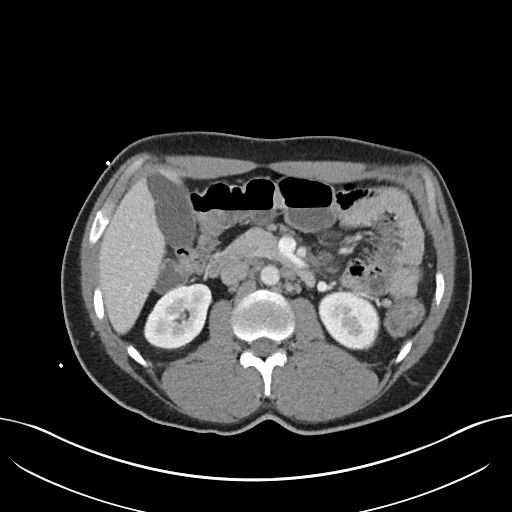
[im 81/103  soft-tissue]
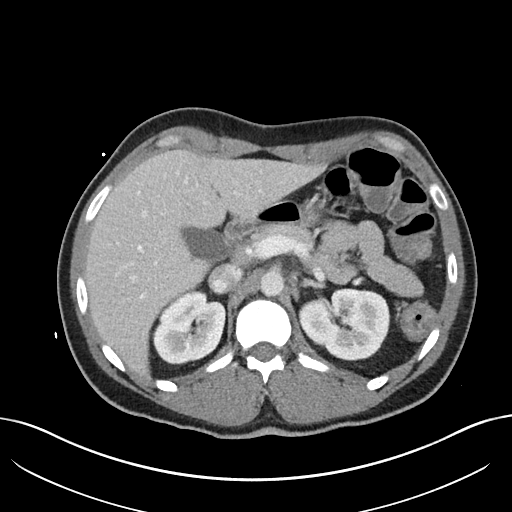
[im 90/103  soft-tissue]
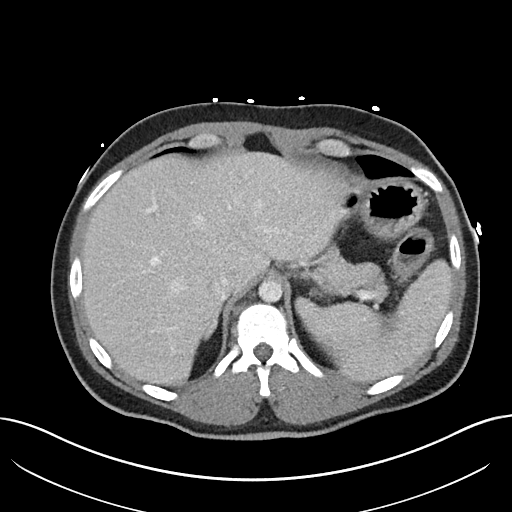
[im 98/103  soft-tissue]
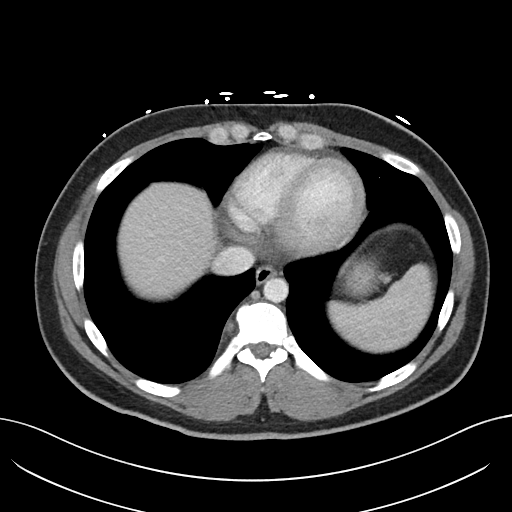

[Series 6: coronal soft tissue · coronal · 0.98mm/px · 3 of 106 slices shown]
[im 36/106  soft-tissue]
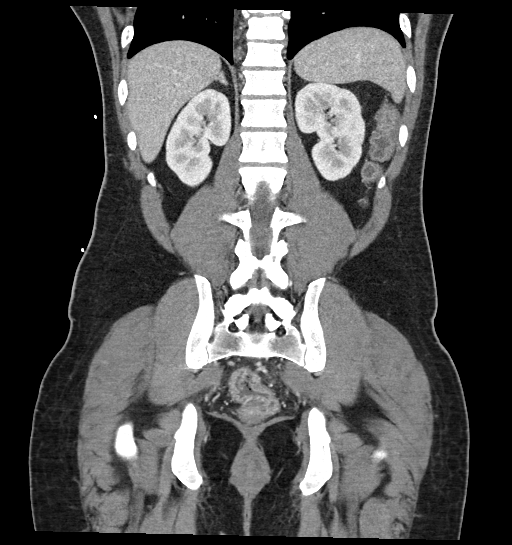
[im 47/106  soft-tissue]
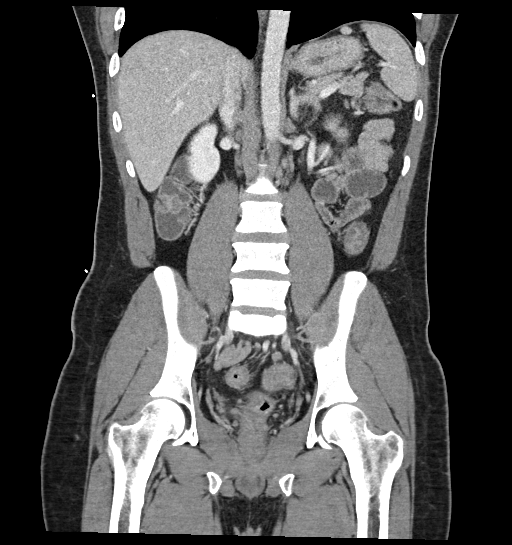
[im 59/106  soft-tissue]
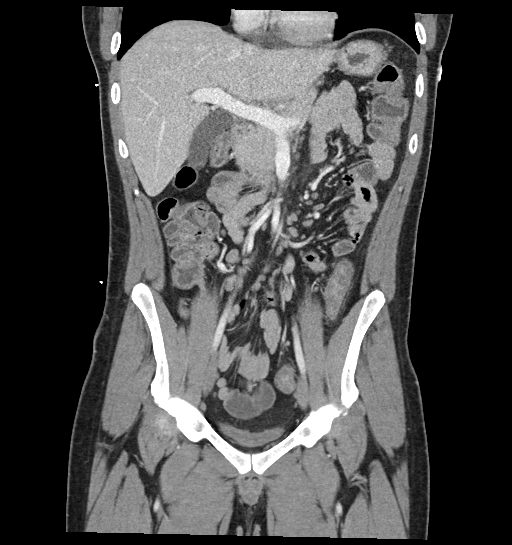

[16 of 46 positions shown; findings below may reference images not displayed]

FINDINGS: Lower chest: No acute abnormality.

Hepatobiliary: No focal liver abnormality is seen. No gallstones,
gallbladder wall thickening, or biliary dilatation.

Pancreas: Unremarkable. No pancreatic ductal dilatation or
surrounding inflammatory changes.

Spleen: Normal in size without focal abnormality.

Adrenals/Urinary Tract: Adrenal glands are unremarkable. Kidneys are
normal, without renal calculi, focal lesion, or hydronephrosis.
Bladder is unremarkable.

Stomach/Bowel: The stomach and appendix are unremarkable. There is
no evidence of bowel obstruction. Wall thickening and minimal
surrounding inflammatory changes are seen involving the descending
and sigmoid colon suggesting infectious or inflammatory colitis.

Vascular/Lymphatic: No significant vascular abnormality is noted.
Mildly enlarged lymph nodes are noted in the perirectal region with
the largest measuring 12 mm, most likely inflammatory in etiology,
although neoplasm cannot be excluded. Mildly enlarged periaortic
adenopathy is also noted with the largest measuring 1 cm. 1 cm left
inguinal lymph node is noted.

Reproductive: Prostate is unremarkable.

Other: No abdominal wall hernia or abnormality. No abdominopelvic
ascites.

Musculoskeletal: No acute or significant osseous findings.
IMPRESSION: 1. Wall thickening and minimal surrounding inflammatory changes are
seen involving the descending and sigmoid colon suggesting
infectious or inflammatory colitis.
2. Mildly enlarged lymph nodes are noted in the perirectal region
with the largest measuring 12 mm, most likely inflammatory in
etiology, although neoplasm cannot be excluded. Mildly enlarged
periaortic adenopathy is also noted with the largest measuring 1 cm.
1 cm left inguinal lymph node is also noted.

## 2022-09-20 IMAGING — CR DG CHEST 2V
2 series · 2 of 2 positions shown · non-contrast
Comparison: 12/12/2018

CLINICAL DATA: Chest pain, shortness of breath

EXAM:
CHEST - 2 VIEW

[w chest pa]
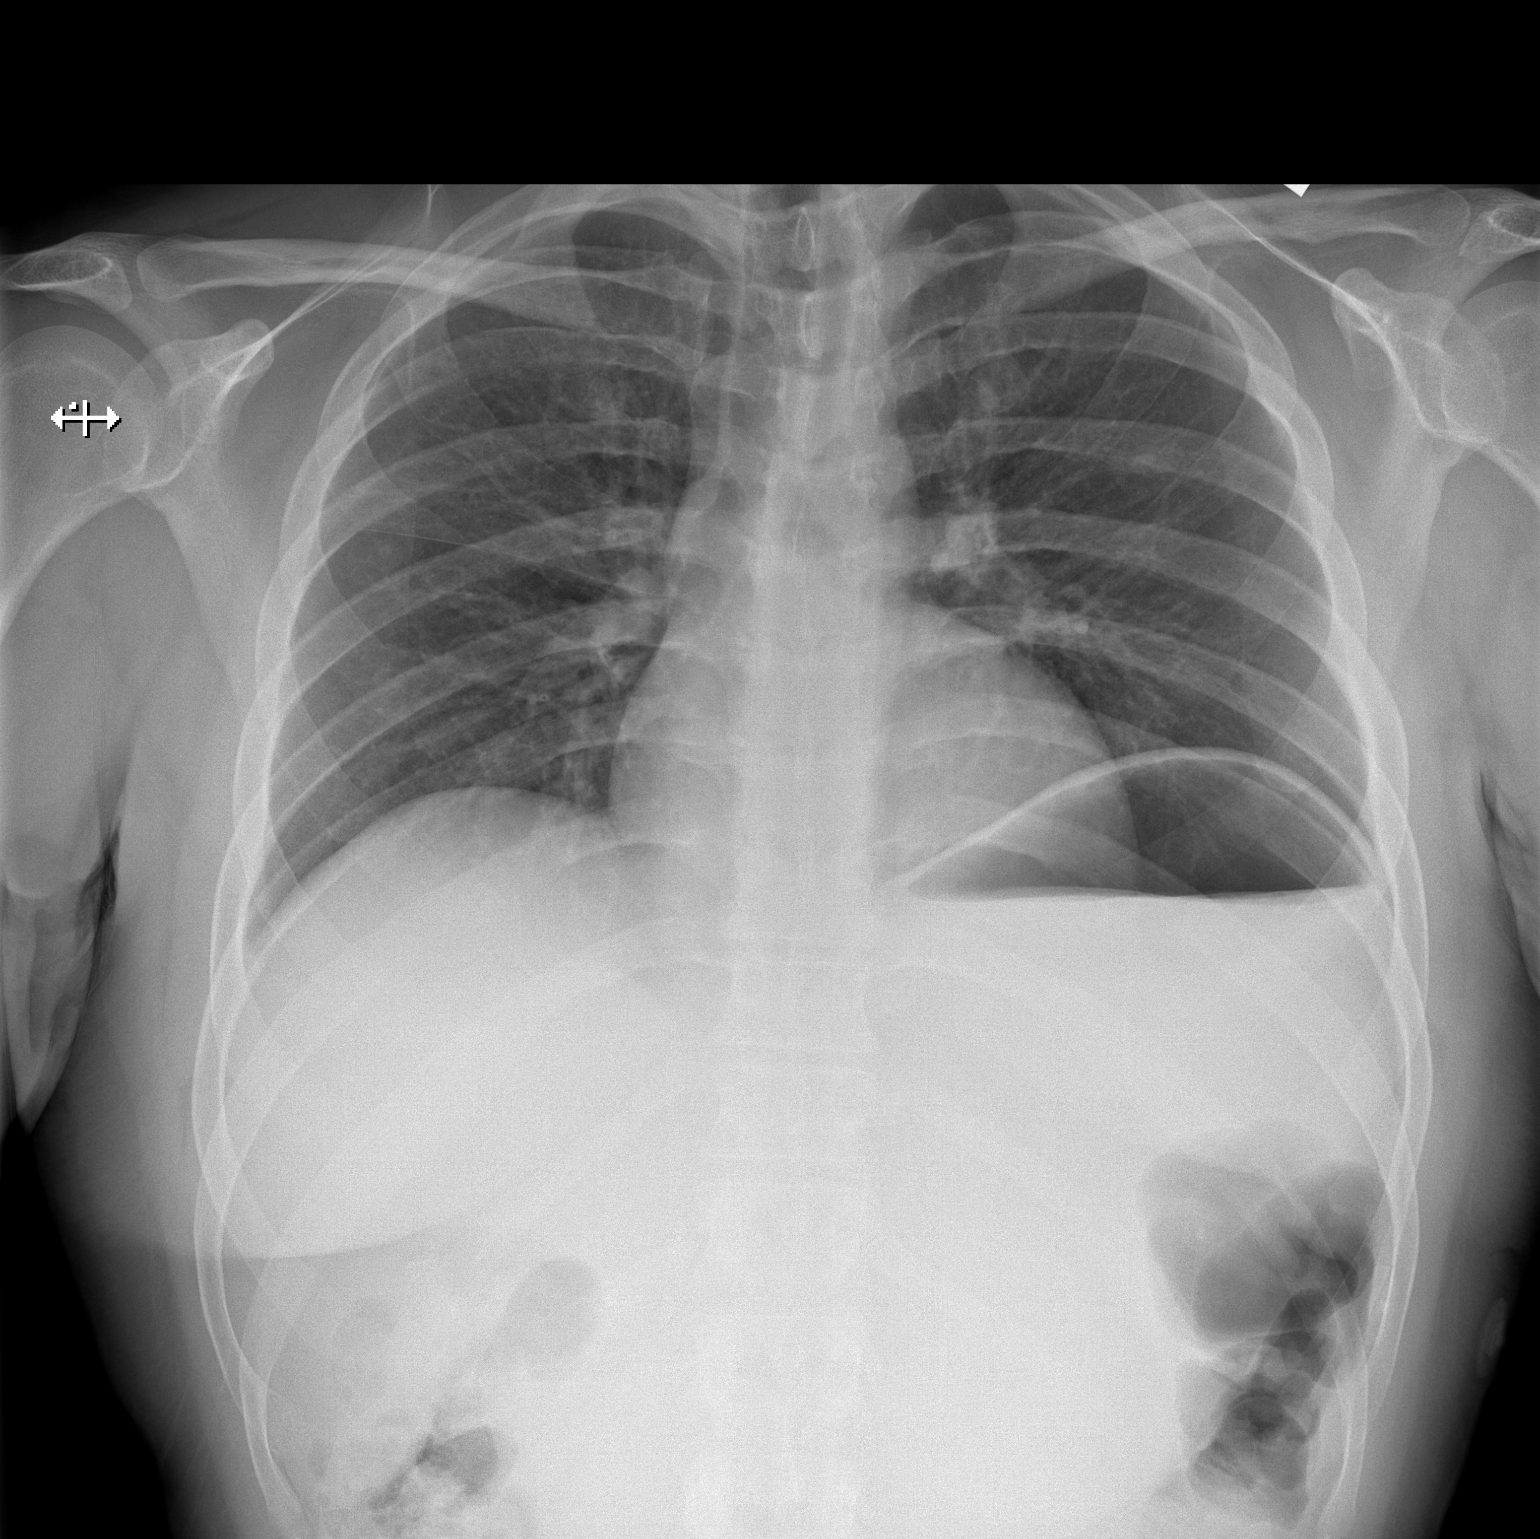

[w chest lat]
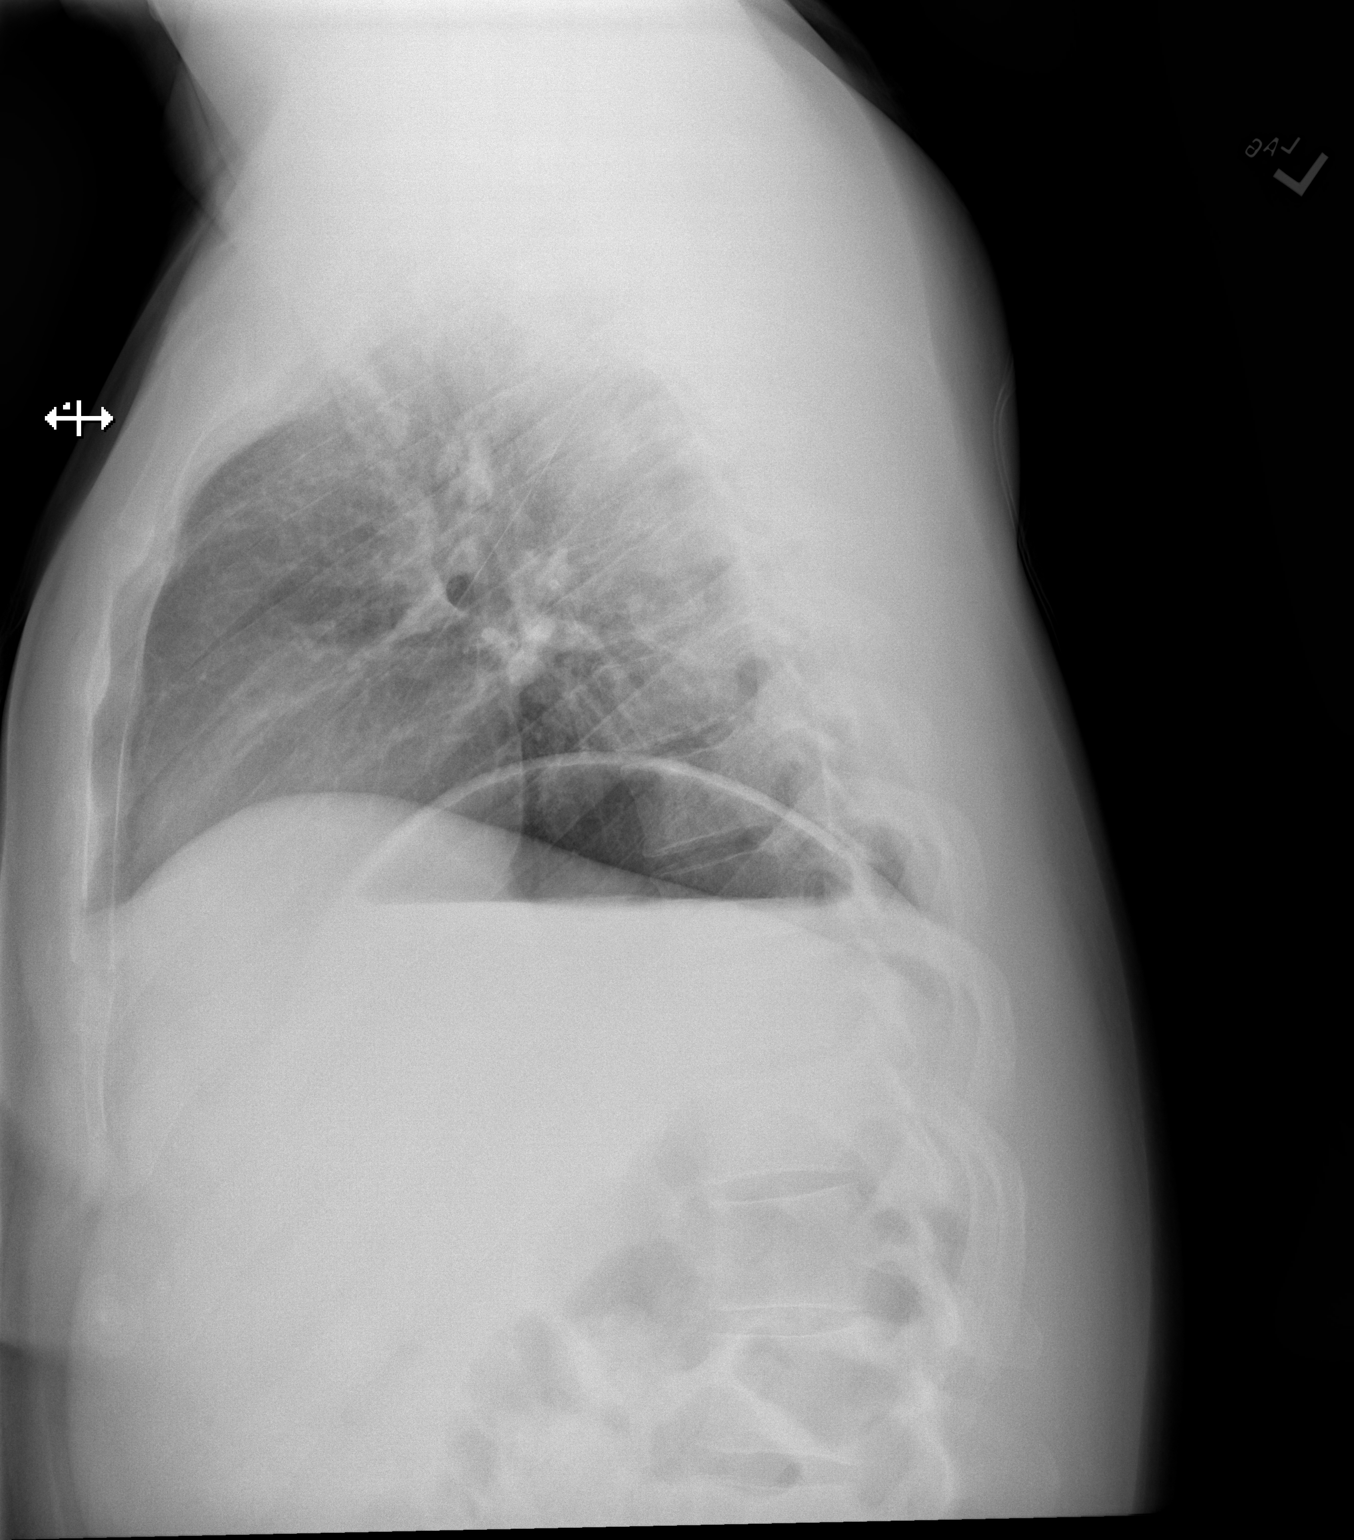

[2 of 2 positions shown; findings below may reference images not displayed]

FINDINGS: Heart and mediastinal contours are within normal limits. No focal
opacities or effusions. No acute bony abnormality.
IMPRESSION: No active cardiopulmonary disease.

## 2022-11-19 NOTE — Progress Notes (Signed)
Opened in error
